# Patient Record
Sex: Male | Born: 1965 | Race: White | Hispanic: No | Marital: Married | Smoking: Former smoker
Health system: Southern US, Community
[De-identification: ages and names within clinical notes are randomized; demographics above are authoritative.]

## PROBLEM LIST (undated history)

## (undated) DIAGNOSIS — M545 Low back pain, unspecified: Secondary | ICD-10-CM

## (undated) DIAGNOSIS — I252 Old myocardial infarction: Secondary | ICD-10-CM

## (undated) DIAGNOSIS — F1010 Alcohol abuse, uncomplicated: Secondary | ICD-10-CM

## (undated) DIAGNOSIS — I2510 Atherosclerotic heart disease of native coronary artery without angina pectoris: Secondary | ICD-10-CM

## (undated) DIAGNOSIS — E119 Type 2 diabetes mellitus without complications: Secondary | ICD-10-CM

## (undated) DIAGNOSIS — I219 Acute myocardial infarction, unspecified: Secondary | ICD-10-CM

## (undated) DIAGNOSIS — T7840XA Allergy, unspecified, initial encounter: Secondary | ICD-10-CM

## (undated) DIAGNOSIS — I639 Cerebral infarction, unspecified: Secondary | ICD-10-CM

## (undated) DIAGNOSIS — M519 Unspecified thoracic, thoracolumbar and lumbosacral intervertebral disc disorder: Secondary | ICD-10-CM

## (undated) DIAGNOSIS — Z9289 Personal history of other medical treatment: Secondary | ICD-10-CM

## (undated) DIAGNOSIS — K219 Gastro-esophageal reflux disease without esophagitis: Secondary | ICD-10-CM

## (undated) DIAGNOSIS — M1990 Unspecified osteoarthritis, unspecified site: Secondary | ICD-10-CM

## (undated) DIAGNOSIS — I10 Essential (primary) hypertension: Secondary | ICD-10-CM

## (undated) DIAGNOSIS — E669 Obesity, unspecified: Secondary | ICD-10-CM

## (undated) DIAGNOSIS — Z87891 Personal history of nicotine dependence: Secondary | ICD-10-CM

## (undated) DIAGNOSIS — G8929 Other chronic pain: Secondary | ICD-10-CM

## (undated) HISTORY — DX: Unspecified thoracic, thoracolumbar and lumbosacral intervertebral disc disorder: M51.9

## (undated) HISTORY — DX: Personal history of other medical treatment: Z92.89

## (undated) HISTORY — DX: Type 2 diabetes mellitus without complications: E11.9

## (undated) HISTORY — PX: CORONARY ANGIOPLASTY: SHX604

## (undated) HISTORY — DX: Essential (primary) hypertension: I10

## (undated) HISTORY — DX: Old myocardial infarction: I25.2

## (undated) HISTORY — DX: Allergy, unspecified, initial encounter: T78.40XA

## (undated) HISTORY — PX: BACK SURGERY: SHX140

---

## 1998-02-25 ENCOUNTER — Ambulatory Visit (HOSPITAL_COMMUNITY): Admission: RE | Admit: 1998-02-25 | Discharge: 1998-02-26 | Payer: Self-pay | Admitting: Neurosurgery

## 1998-03-11 ENCOUNTER — Ambulatory Visit (HOSPITAL_COMMUNITY): Admission: RE | Admit: 1998-03-11 | Discharge: 1998-03-12 | Payer: Self-pay | Admitting: Neurosurgery

## 1998-03-25 ENCOUNTER — Ambulatory Visit (HOSPITAL_COMMUNITY): Admission: RE | Admit: 1998-03-25 | Discharge: 1998-03-25 | Payer: Self-pay | Admitting: Neurosurgery

## 1998-04-12 ENCOUNTER — Inpatient Hospital Stay (HOSPITAL_COMMUNITY): Admission: RE | Admit: 1998-04-12 | Discharge: 1998-04-14 | Payer: Self-pay | Admitting: Neurosurgery

## 1998-11-05 HISTORY — PX: BACK SURGERY: SHX140

## 1998-11-05 HISTORY — PX: LUMBAR FUSION: SHX111

## 2004-05-05 HISTORY — PX: CARDIAC CATHETERIZATION: SHX172

## 2005-03-14 ENCOUNTER — Ambulatory Visit: Payer: Self-pay | Admitting: Family Medicine

## 2005-03-28 ENCOUNTER — Ambulatory Visit: Payer: Self-pay | Admitting: Orthopedic Surgery

## 2005-07-30 ENCOUNTER — Ambulatory Visit: Payer: Self-pay | Admitting: Family Medicine

## 2005-09-10 ENCOUNTER — Ambulatory Visit: Payer: Self-pay | Admitting: Family Medicine

## 2005-10-17 ENCOUNTER — Ambulatory Visit: Payer: Self-pay | Admitting: Family Medicine

## 2005-11-14 ENCOUNTER — Ambulatory Visit: Payer: Self-pay | Admitting: Family Medicine

## 2006-02-01 ENCOUNTER — Ambulatory Visit: Payer: Self-pay | Admitting: Family Medicine

## 2006-02-19 ENCOUNTER — Ambulatory Visit: Payer: Self-pay | Admitting: Family Medicine

## 2006-04-02 ENCOUNTER — Ambulatory Visit: Payer: Self-pay | Admitting: Family Medicine

## 2006-07-01 ENCOUNTER — Ambulatory Visit: Payer: Self-pay | Admitting: Family Medicine

## 2006-09-03 ENCOUNTER — Ambulatory Visit: Payer: Self-pay | Admitting: Family Medicine

## 2006-09-30 ENCOUNTER — Ambulatory Visit: Payer: Self-pay | Admitting: Family Medicine

## 2006-11-13 ENCOUNTER — Ambulatory Visit: Payer: Self-pay | Admitting: Family Medicine

## 2007-01-07 ENCOUNTER — Ambulatory Visit: Payer: Self-pay | Admitting: Family Medicine

## 2007-01-27 ENCOUNTER — Ambulatory Visit: Payer: Self-pay | Admitting: Family Medicine

## 2007-02-18 ENCOUNTER — Ambulatory Visit: Payer: Self-pay | Admitting: Family Medicine

## 2007-06-03 ENCOUNTER — Ambulatory Visit: Payer: Self-pay | Admitting: Family Medicine

## 2007-06-03 DIAGNOSIS — I10 Essential (primary) hypertension: Secondary | ICD-10-CM | POA: Insufficient documentation

## 2007-06-05 LAB — CONVERTED CEMR LAB
Albumin: 3.8 g/dL (ref 3.5–5.2)
Basophils Relative: 1 % (ref 0.0–1.0)
CO2: 27 meq/L (ref 19–32)
Chloride: 104 meq/L (ref 96–112)
Creatinine, Ser: 1 mg/dL (ref 0.4–1.5)
Eosinophils Relative: 6.8 % — ABNORMAL HIGH (ref 0.0–5.0)
GFR calc non Af Amer: 87 mL/min
HCT: 45.9 % (ref 39.0–52.0)
Hemoglobin: 16 g/dL (ref 13.0–17.0)
LDL Cholesterol: 126 mg/dL — ABNORMAL HIGH (ref 0–99)
Lymphocytes Relative: 33.1 % (ref 12.0–46.0)
Monocytes Absolute: 0.9 10*3/uL — ABNORMAL HIGH (ref 0.2–0.7)
Neutro Abs: 3 10*3/uL (ref 1.4–7.7)
Neutrophils Relative %: 45.2 % (ref 43.0–77.0)
RDW: 11.7 % (ref 11.5–14.6)
Sodium: 139 meq/L (ref 135–145)
TSH: 1.09 microintl units/mL (ref 0.35–5.50)
VLDL: 11 mg/dL (ref 0–40)
WBC: 6.7 10*3/uL (ref 4.5–10.5)

## 2007-07-01 ENCOUNTER — Ambulatory Visit: Payer: Self-pay | Admitting: Family Medicine

## 2007-07-01 DIAGNOSIS — F528 Other sexual dysfunction not due to a substance or known physiological condition: Secondary | ICD-10-CM

## 2007-07-01 DIAGNOSIS — M545 Low back pain: Secondary | ICD-10-CM

## 2007-07-01 DIAGNOSIS — R209 Unspecified disturbances of skin sensation: Secondary | ICD-10-CM | POA: Insufficient documentation

## 2007-07-01 DIAGNOSIS — Z87891 Personal history of nicotine dependence: Secondary | ICD-10-CM | POA: Insufficient documentation

## 2007-09-10 ENCOUNTER — Encounter: Payer: Self-pay | Admitting: Family Medicine

## 2007-09-29 ENCOUNTER — Ambulatory Visit: Payer: Self-pay | Admitting: Family Medicine

## 2007-09-29 DIAGNOSIS — E538 Deficiency of other specified B group vitamins: Secondary | ICD-10-CM | POA: Insufficient documentation

## 2007-10-28 ENCOUNTER — Ambulatory Visit: Payer: Self-pay | Admitting: Family Medicine

## 2007-11-28 ENCOUNTER — Ambulatory Visit: Payer: Self-pay | Admitting: Family Medicine

## 2007-12-30 ENCOUNTER — Ambulatory Visit: Payer: Self-pay | Admitting: Family Medicine

## 2008-01-27 ENCOUNTER — Ambulatory Visit: Payer: Self-pay | Admitting: Family Medicine

## 2008-02-27 ENCOUNTER — Ambulatory Visit: Payer: Self-pay | Admitting: Family Medicine

## 2008-03-30 ENCOUNTER — Ambulatory Visit: Payer: Self-pay | Admitting: Family Medicine

## 2008-04-30 ENCOUNTER — Ambulatory Visit: Payer: Self-pay | Admitting: Family Medicine

## 2008-05-31 ENCOUNTER — Ambulatory Visit: Payer: Self-pay | Admitting: Family Medicine

## 2008-07-01 ENCOUNTER — Ambulatory Visit: Payer: Self-pay | Admitting: Family Medicine

## 2008-08-02 ENCOUNTER — Ambulatory Visit: Payer: Self-pay | Admitting: Family Medicine

## 2008-08-25 ENCOUNTER — Ambulatory Visit: Payer: Self-pay | Admitting: Family Medicine

## 2008-08-26 ENCOUNTER — Ambulatory Visit: Payer: Self-pay | Admitting: Family Medicine

## 2008-09-01 ENCOUNTER — Ambulatory Visit: Payer: Self-pay | Admitting: Family Medicine

## 2008-09-13 ENCOUNTER — Ambulatory Visit: Payer: Self-pay | Admitting: Family Medicine

## 2008-09-13 DIAGNOSIS — IMO0002 Reserved for concepts with insufficient information to code with codable children: Secondary | ICD-10-CM

## 2008-09-14 ENCOUNTER — Ambulatory Visit: Payer: Self-pay | Admitting: Family Medicine

## 2008-09-15 ENCOUNTER — Ambulatory Visit: Payer: Self-pay | Admitting: Family Medicine

## 2008-09-20 ENCOUNTER — Ambulatory Visit: Payer: Self-pay | Admitting: Family Medicine

## 2008-09-23 ENCOUNTER — Ambulatory Visit: Payer: Self-pay | Admitting: Family Medicine

## 2009-06-13 ENCOUNTER — Ambulatory Visit: Payer: Self-pay | Admitting: Internal Medicine

## 2009-06-16 LAB — CONVERTED CEMR LAB
ALT: 50 units/L (ref 0–53)
AST: 29 units/L (ref 0–37)
Basophils Relative: 0.6 % (ref 0.0–3.0)
Bilirubin, Direct: 0 mg/dL (ref 0.0–0.3)
CO2: 30 meq/L (ref 19–32)
Creatinine, Ser: 1 mg/dL (ref 0.4–1.5)
Eosinophils Relative: 5.9 % — ABNORMAL HIGH (ref 0.0–5.0)
Glucose, Bld: 120 mg/dL — ABNORMAL HIGH (ref 70–99)
Hemoglobin: 16.9 g/dL (ref 13.0–17.0)
Lymphocytes Relative: 25.5 % (ref 12.0–46.0)
MCV: 100.1 fL — ABNORMAL HIGH (ref 78.0–100.0)
Neutrophils Relative %: 58.2 % (ref 43.0–77.0)
Potassium: 3.9 meq/L (ref 3.5–5.1)
RBC: 4.83 M/uL (ref 4.22–5.81)
Sodium: 145 meq/L (ref 135–145)
Total Bilirubin: 1 mg/dL (ref 0.3–1.2)
WBC: 6.9 10*3/uL (ref 4.5–10.5)

## 2010-03-13 ENCOUNTER — Ambulatory Visit: Payer: Self-pay | Admitting: Family Medicine

## 2010-03-15 ENCOUNTER — Ambulatory Visit: Payer: Self-pay | Admitting: Family Medicine

## 2010-03-17 ENCOUNTER — Ambulatory Visit: Payer: Self-pay | Admitting: Family Medicine

## 2010-03-24 ENCOUNTER — Ambulatory Visit: Payer: Self-pay | Admitting: Family Medicine

## 2010-03-31 ENCOUNTER — Ambulatory Visit: Payer: Self-pay | Admitting: Family Medicine

## 2010-06-13 ENCOUNTER — Encounter (INDEPENDENT_AMBULATORY_CARE_PROVIDER_SITE_OTHER): Payer: Self-pay | Admitting: *Deleted

## 2010-12-05 NOTE — Assessment & Plan Note (Signed)
Summary: FU   Vital Signs:  Patient Profile:   45 Years Old Male Weight:      98.8 pounds Temp:     98.8 degrees F oral Pulse rate:   80 / minute Pulse rhythm:   regular BP sitting:   120 / 80  (left arm) Cuff size:   large  Vitals Entered By: Providence Crosby (September 15, 2008 11:38 AM)                 Chief Complaint:  followup abscess.  History of Present Illness: Here for dressing change. Had fever after incision done yesterday but has felt ok since. Finally slept last night using pain medication. It feels better to him today. has changed the dressing once last night. Is on vacation this week as he was going to spend the week hunting. Hasn't been able to do that qnd I don't anticipate he will.    Prior Medications Reviewed Using: Patient Recall  Current Allergies (reviewed today): ACE INHIBITORS      Physical Exam  General:     Well-developed,well-nourished,in no acute distress; alert,appropriate and cooperative throughout examination Head:     Normocephalic and atraumatic without obvious abnormalities. No apparent alopecia or balding. Eyes:     Conjunctiva clear bilaterally.  Ears:     External ear exam shows no significant lesions or deformities.  Otoscopic examination reveals clear canals, tympanic membranes are intact bilaterally without bulging, retraction, inflammation or discharge. Hearing is grossly normal bilaterally. Nose:     External nasal examination shows no deformity or inflammation. Nasal mucosa are pink and moist without lesions or exudates. Mouth:     Oral mucosa and oropharynx without lesions or exudates.  Teeth in good repair. Skin:     10 x4 cm swollen organized minimally fluctulant area under right arm on lat superior chest wall. Drains in place, removed and drained spontaneously from second incision with drain out. Drain left out of first incision. Drain replaced of second incision after culture taken.     Impression &  Recommendations:  Problem # 1:  CELLULITIS AND ABSCESS OF UPPER ARM AND FOREARM (ICD-682.3) Assessment: Improved Slighty improved. Drain replaced and dressing reapplied. Culture sent. His updated medication list for this problem includes:    Bactrim Ds 800-160 Mg Tabs (Sulfamethoxazole-trimethoprim) .Marland Kitchen... 2 tabs by mouth two times a day    Bactrim Ds 800-160 Mg Tabs (Sulfamethoxazole-trimethoprim) .Marland Kitchen... 2  tabs by mouth two times a day  Orders: T-Culture, Wound ([id number]) No Charge Patient Arrived (NCPA0) (NCPA0)  Elevate affected area. Warm moist compresses for 20 minutes every 2 hours while awake. Take antibiotics as directed and take acetaminophen as needed. To be seen in 48-72 hours if no improvement, sooner if worse.   Complete Medication List: 1)  Cozaar 100 Mg Tabs (Losartan potassium) 2)  Hydrochlorothiazide 25 Mg Tabs (Hydrochlorothiazide) .... Take 1/2 tablet once a day 3)  Nasacort Aq 55 Mcg/act Aers (Triamcinolone acetonide(nasal)) .... 2 sprays each nostril once daily 4)  Bactrim Ds 800-160 Mg Tabs (Sulfamethoxazole-trimethoprim) .... 2 tabs by mouth two times a day 5)  Bactrim Ds 800-160 Mg Tabs (Sulfamethoxazole-trimethoprim) .... 2  tabs by mouth two times a day 6)  Percocet 7.5-325 Mg Tabs (Oxycodone-acetaminophen) .... One tab by mouth every 6 hrs as needed for pain   Patient Instructions: 1)  Will see back Fri 0945. Again Mon both times for dressing changes.   ]

## 2010-12-05 NOTE — Letter (Signed)
Summary: Out of Work  [facility] [employer] [facility] at [facility]  [address] [place], [place] [address]   Phone: [phone]  Fax: [phone]    Mar 13, 2010   Employee:  London G Simmering    To Whom It May Concern:   For Medical reasons, please excuse the above named employee from work for the following dates:  Start:   Mar 13, 2010  End:   Mar 16, 2010  If you need additional information, please feel free to contact our office.         Sincerely,    Ruthe Mannan MD

## 2010-12-05 NOTE — Letter (Signed)
Summary: Out of Work  [facility] [employer] [facility] at [facility]  [address] [place], [place] [address]   Phone: [phone]  Fax: [phone]    Mar 17, 2010   Employee:  Pepper G Pond    To Whom It May Concern:   For Medical reasons, please excuse the above named employee from work for the following dates:  Start:   03/17/2010  End:   can return 03/20/2010 monday if he is feeling better   If you need additional information, please feel free to contact our office.         Sincerely,    Judith Part MD

## 2010-12-05 NOTE — Assessment & Plan Note (Signed)
Summary: 2 DAY F/U DLO   Vital Signs:  Patient profile:   45 year old male Height:      69 inches Weight:      266 pounds BMI:     39.42 Temp:     98.2 degrees F oral Pulse rate:   64 / minute Pulse rhythm:   regular BP sitting:   114 / 74  (left arm) Cuff size:   large  Vitals Entered By: Lewanda Rife LPN (Mar 17, 2010 10:47 AM) CC: f/u of lt arm   History of Present Illness: getting better less redness and swelling and pain only d/c has been clear- no pus   prelim wound cx shows some staph- still waiting on final report   no side eff from the abx  Allergies: 1)  Ace Inhibitors  Review of Systems General:  Denies chills, fatigue, and fever. Eyes:  Denies eye irritation. CV:  Denies chest pain or discomfort and palpitations. MS:  Denies muscle weakness. Derm:  Complains of itching; denies rash. Neuro:  Denies numbness and tingling. Endo:  Denies cold intolerance and heat intolerance.  Physical Exam  General:  Well-developed,well-nourished,in no acute distress; alert,appropriate and cooperative throughout examination Mouth:  pharynx pink and moist.   Neck:  No deformities, masses, or tenderness noted. Heart:  normal rate, regular rhythm, no murmur, and no gallop.   Skin:  abcess on L arm is still firm but smaller andless tender redness is markedly decreased as is swelling  no drainage Cervical Nodes:  No lymphadenopathy noted Psych:  normal affect, talkative and pleasant    Impression & Recommendations:  Problem # 1:  CELLULITIS AND ABSCESS OF UPPER ARM AND FOREARM (ICD-682.3) Assessment Improved much improved with addn of septra rev wound cx - suspect will be mrsa  disc hygiene for this  continue this and hygiene and warm compress f/u mid next week update asap or seek care if worse redness/swelling/fever or pain His updated medication list for this problem includes:    Doxycycline Hyclate 100 Mg Caps (Doxycycline hyclate) .Marland Kitchen... Take 1 tab twice a day x 10  days    Septra Ds 800-160 Mg Tabs (Sulfamethoxazole-trimethoprim) .Marland Kitchen... 1 by mouth two times a day for 10 days  Complete Medication List: 1)  Cozaar 100 Mg Tabs (Losartan potassium) .... One by mouth daily 2)  Hydrochlorothiazide 25 Mg Tabs (Hydrochlorothiazide) .... Take 1/2 tablet once a day 3)  Doxycycline Hyclate 100 Mg Caps (Doxycycline hyclate) .... Take 1 tab twice a day x 10 days 4)  Vicodin 5-500 Mg Tabs (Hydrocodone-acetaminophen) .Marland Kitchen.. 1-2 tab by mouth two times a day as needed pan 5)  Septra Ds 800-160 Mg Tabs (Sulfamethoxazole-trimethoprim) .Marland Kitchen.. 1 by mouth two times a day for 10 days  Patient Instructions: 1)  continue current antibiotics 2)  back to work on monday 3)  follow up with me mid next week 4)  if worse or pain or fever please call 5)  continue cleansing and warm compress  6)  will update when cx result returns  Current Allergies (reviewed today): ACE INHIBITORS

## 2010-12-05 NOTE — Assessment & Plan Note (Signed)
Summary: swollen arm/alc   Vital Signs:  Patient profile:   45 year old male Height:      69 inches Weight:      271.50 pounds BMI:     40.24 Temp:     98.2 degrees F oral Pulse rate:   80 / minute Pulse rhythm:   regular BP sitting:   132 / 72  (right arm) Cuff size:   large  Vitals Entered By: Delilah Shan CMA Duncan Dull) (Mar 13, 2010 10:48 AM) CC: Swollen left arm   History of Present Illness: 45 yo with bump on left arm. Noticed a tiny bug bite on friday. Since then, has become much bigger, firm and red.  Entire arm hurts when he moves it. Started draining clear fluid last night.  Feels a little feverish and nauseated. No vomiting.  PMH of boil under his left arm in past.  Unsure if it was MRSA.  Current Medications (verified): 1)  Cozaar 100 Mg Tabs (Losartan Potassium) .... One By Mouth Daily 2)  Hydrochlorothiazide 25 Mg Tabs (Hydrochlorothiazide) .... Take 1/2 Tablet Once A Day 3)  Doxycycline Hyclate 100 Mg Caps (Doxycycline Hyclate) .... Take 1 Tab Twice A Day 4)  Vicodin 5-500 Mg Tabs (Hydrocodone-Acetaminophen) .Marland Kitchen.. 1-2 Tab By Mouth Two Times A Day As Needed Pan  Allergies: 1)  Ace Inhibitors  Review of Systems      See HPI General:  Complains of chills; denies fever. GI:  Complains of nausea; denies vomiting.  Physical Exam  General:  alert and normal appearance.   VSS, non toxic appearing. Skin:  3 cm boil , open, with very firm area surroudning (approx 6 cm), erythematous, warm to touch. No drainage visible but area is open. Non fluctuant. Psych:  normally interactive, good eye contact, not anxious appearing, and not depressed appearing.     Impression & Recommendations:  Problem # 1:  CELLULITIS AND ABSCESS OF UPPER ARM AND FOREARM (ICD-682.3) Assessment New Given that it is open and draining but very firm today, will not I and D today. Given Doxycyline 100 mg two times a day x 10 days. Advised warm compresses, Vicodin as needed pain. Follow  up with primary MD later this week ( I am out of town) if no improvement or symptoms worsen. His updated medication list for this problem includes:    Doxycycline Hyclate 100 Mg Caps (Doxycycline hyclate) .Marland Kitchen... Take 1 tab twice a day x 10 days  Complete Medication List: 1)  Cozaar 100 Mg Tabs (Losartan potassium) .... One by mouth daily 2)  Hydrochlorothiazide 25 Mg Tabs (Hydrochlorothiazide) .... Take 1/2 tablet once a day 3)  Doxycycline Hyclate 100 Mg Caps (Doxycycline hyclate) .... Take 1 tab twice a day x 10 days 4)  Vicodin 5-500 Mg Tabs (Hydrocodone-acetaminophen) .Marland Kitchen.. 1-2 tab by mouth two times a day as needed pan  Patient Instructions: 1)  Make an appointment to see Dr. Milinda Antis on Thursday or Friday. 2)  Put warm compresses on it up to 5 times a day. 3)  Call us if you develop a fever or it starts to look worse. Prescriptions: DOXYCYCLINE HYCLATE 100 MG CAPS (DOXYCYCLINE HYCLATE) Take 1 tab twice a day x 10 days  #20 x 0   Entered and Authorized by:   Ruthe Mannan MD   Signed by:   Ruthe Mannan MD on 03/13/2010   Method used:   [facility]        [facility]  [address] [phone][facility] (retail)  [address]       [place], [place]  [address]       Ph: [phone] or [phone]       Fax: [phone]   RxID:   [id number] DOXYCYCLINE HYCLATE 100 MG CAPS (DOXYCYCLINE HYCLATE) Take 1 tab twice a day x 10 days  #20 x 0   Entered and Authorized by:   Ruthe Mannan MD   Signed by:   Ruthe Mannan MD on 03/13/2010   Method used:   Print then Give to Patient   RxID:   [id number] VICODIN 5-500 MG TABS (HYDROCODONE-ACETAMINOPHEN) 1-2 tab by mouth two times a day as needed pan  #30 x 0   Entered and Authorized by:   Ruthe Mannan MD   Signed by:   Ruthe Mannan MD on 03/13/2010   Method used:   Print then Give to Patient   RxID:   [id number] DOXYCYCLINE HYCLATE 100 MG CAPS (DOXYCYCLINE HYCLATE) Take 1 tab twice a day  #20 x 0   Entered and Authorized by:   Ruthe Mannan MD   Signed  by:   Ruthe Mannan MD on 03/13/2010   Method used:   [facility]        [facility]  [address] (retail)       [address]       [place], [place]  [address]       Ph: [phone] or [phone]       Fax: [phone]   RxID:   [id number]   Current Allergies (reviewed today): ACE INHIBITORS

## 2010-12-05 NOTE — Letter (Signed)
Summary: Nadara Eaton letter  [facility]  [address] [place], [place] [address]   Phone: [phone]  Fax: [phone]       06/13/2010 MRN: [id number]  Brentin Mode [address] [place], [place]  [address]  Dear Mr. Kruger,  [facility] and [place] announce the retirement of Arta Silence, M.D., from full-time practice at the [facility] office effective May 04, 2010 and his plans of returning part-time.  It is important to Dr. Hetty Ely and to our practice that you understand that [facility] has seven physicians in our office for your health care needs.  We will continue to offer the same exceptional care that you have today.    Dr. Hetty Ely has spoken to many of you about his plans for retirement and returning part-time in the fall.   We will continue to work with you through the transition to schedule appointments for you in the office and meet the high standards that [facility] is committed to.   Again, it is with great pleasure that we share the news that Dr. Hetty Ely will return to [facility] in October of 2011 with a reduced schedule.    If you have any questions, or would like to request an appointment with one of our physicians, please call us at [phone] and press the option for Scheduling an appointment.  We take pleasure in providing you with excellent patient care and look forward to seeing you at your next office visit.  Our [facility] Physicians are:  Tillman Abide, M.D. Laurita Quint, M.D. Roxy Manns, M.D. Kerby Nora, M.D. Hannah Beat, M.D. Ruthe Mannan, M.D. We proudly welcomed Raechel Ache, M.D. and Eustaquio Boyden, M.D. to the practice in July/August 2011.  Sincerely,  [facility]

## 2010-12-05 NOTE — Assessment & Plan Note (Signed)
Summary: ROA FOR FOLLOW-UP/JRR   Vital Signs:  Patient profile:   45 year old male Height:      69 inches Weight:      275.50 pounds BMI:     40.83 Temp:     97.9 degrees F oral Pulse rate:   76 / minute Pulse rhythm:   regular BP sitting:   128 / 76  (left arm) Cuff size:   large  Vitals Entered By: Lewanda Rife LPN (Mar 24, 2010 2:06 PM) CC: follow up left arm   History of Present Illness: f/u cellulitis and abcess of arm  on doxy/ sulfa and was improving cx did grow out mrsa sens to both  area never did get soft- is firm -- but is much smaller and less red  no drainage   no fever and feeling ok overall   has 1-2 d left of sulfa  Allergies: 1)  Ace Inhibitors  Review of Systems General:  Denies chills, fatigue, fever, loss of appetite, and malaise; feels fine and back to work . CV:  Denies chest pain or discomfort and palpitations. Resp:  Denies cough and wheezing. MS:  Denies joint pain, cramps, and muscle weakness. Derm:  Complains of itching and lesion(s); denies rash. Neuro:  Denies numbness and tingling. Heme:  Denies abnormal bruising and bleeding.  Physical Exam  General:  Well-developed,well-nourished,in no acute distress; alert,appropriate and cooperative throughout examination Neck:  no LN  Skin:  area of induration and redness on L arm is much smaller- several cm scab intact/ no drainage  still very firm - no fluctuance   Cervical Nodes:  No lymphadenopathy noted Psych:  normal affect, talkative and pleasant    Impression & Recommendations:  Problem # 1:  CELLULITIS AND ABSCESS OF UPPER ARM AND FOREARM (ICD-682.3) Assessment Improved  this is greatly improved -- central area still firm with a scar  will plan to continue the septra ds for mrsa for 10 more days and re check in 1 week  adv to continue hygiene and warm compresses to enc drainage if any/ and loosely cover  given handout on mrsa from [facility] to read  The following medications were  removed from the medication list:    Septra Ds 800-160 Mg Tabs (Sulfamethoxazole-trimethoprim) .Marland Kitchen... 1 by mouth two times a day for 10 days His updated medication list for this problem includes:    Bactrim Ds 800-160 Mg Tabs (Sulfamethoxazole-trimethoprim) .Marland Kitchen... 1 by mouth two times a day for 10 days for arm wound  Orders: Prescription Created Electronically [id number])  Complete Medication List: 1)  Cozaar 100 Mg Tabs (Losartan potassium) .... One by mouth daily 2)  Hydrochlorothiazide 25 Mg Tabs (Hydrochlorothiazide) .... Take 1/2 tablet once a day 3)  Vicodin 5-500 Mg Tabs (Hydrocodone-acetaminophen) .Marland Kitchen.. 1-2 tab by mouth two times a day as needed pan 4)  Bactrim Ds 800-160 Mg Tabs (Sulfamethoxazole-trimethoprim) .Marland Kitchen.. 1 by mouth two times a day for 10 days for arm wound  Patient Instructions: 1)  keep washing area with antibacterial soap and water 2)  continue sulfa antibiotic for 10 more days - I sent to pharmacy  3)  follow up with me in 1 week for one more re check (can cancel that if totally better) 4)  update me asap if pain/ worse swelling or redness or fever  Prescriptions: BACTRIM DS 800-160 MG TABS (SULFAMETHOXAZOLE-TRIMETHOPRIM) 1 by mouth two times a day for 10 days for arm wound  #20 x 0   Entered and Authorized  by:   Judith Part MD   Signed by:   Judith Part MD on 03/24/2010   Method used:   [facility]        [facility]  [address] (retail)       [address]       [place], [place]  [address]       Ph: [phone] or [phone]       Fax: [phone]   RxID:   [id number]   Current Allergies (reviewed today): ACE INHIBITORS

## 2010-12-05 NOTE — Assessment & Plan Note (Signed)
Summary: recheck/dlo   Vital Signs:  Patient profile:   45 year old male Height:      69 inches Weight:      271.25 pounds BMI:     40.20 Temp:     98.3 degrees F oral Pulse rate:   76 / minute Pulse rhythm:   regular BP sitting:   118 / 76  (left arm) Cuff size:   large  Vitals Entered By: Lewanda Rife LPN (Mar 31, 2010 12:16 PM) CC: f/u recheck lt arm   History of Present Illness: here for re check arm abcess/ mrsa -- on another 10 days of septra   quit doing warm compresses not painful is smaller   no fevers or other symptoms   Allergies: 1)  Ace Inhibitors  Past History:  Past Medical History: Last updated: 07-11-2007 Hypertension  Past Surgical History: Last updated: 11-Jul-2007 L-S ruptured disk- surgery Cardiac cath- minimal CAD (05/2004)  Family History: Last updated: 2007/07/11 Father: deceased- MI, HTN Mother: well Siblings: 1 sister  Social History: Last updated: Jul 11, 2007 Marital Status: Married Children: 1 son Occupation: Haematologist  Risk Factors: Smoking Status: quit (Jul 11, 2007)  Review of Systems General:  Denies chills, fatigue, fever, loss of appetite, and malaise. Eyes:  Denies blurring. CV:  Denies chest pain or discomfort and palpitations. Resp:  Denies cough, shortness of breath, and wheezing. GI:  Denies abdominal pain and change in bowel habits. MS:  Denies loss of strength and stiffness. Derm:  Complains of itching; denies poor wound healing. Neuro:  Denies numbness, tremors, and weakness.  Physical Exam  General:  Well-developed,well-nourished,in no acute distress; alert,appropriate and cooperative throughout examination Eyes:  vision grossly intact, pupils equal, pupils round, and pupils reactive to light.   Msk:  see skin exam  Pulses:  R and L carotid,radial,femoral,dorsalis pedis and posterior tibial pulses are full and equal bilaterally Neurologic:  sensation intact to light touch.   Skin:  area of induration on  L arm is down to just over 1 cm with less redness scab in place no drainage or tenderness  Cervical Nodes:  No lymphadenopathy noted Psych:  normal affect, talkative and pleasant    Impression & Recommendations:  Problem # 1:  CELLULITIS AND ABSCESS OF UPPER ARM AND FOREARM (ICD-682.3) Assessment Improved with hx mrsa- continues to improve on bactrim will finish course inst to start warm compress again  update next week if not continuing to imp or earlier if any worsening His updated medication list for this problem includes:    Bactrim Ds 800-160 Mg Tabs (Sulfamethoxazole-trimethoprim) .Marland Kitchen... 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 1 by mouth once daily as directed  prn 30 minutes before sexual activity  Orders: Prescription Created Electronically [id number]  Complete Medication List: 1)  Cozaar 100 Mg Tabs (Losartan potassium) .... One by mouth daily 2)  Hydrochlorothiazide 25 Mg Tabs (Hydrochlorothiazide) .... Take 1/2 tablet once a day 3)  Vicodin 5-500 Mg Tabs (Hydrocodone-acetaminophen) .Marland Kitchen.. 1-2 tab by mouth two times a day as needed pan 4)  Bactrim Ds 800-160 Mg Tabs (Sulfamethoxazole-trimethoprim) .Marland Kitchen.. 1 by mouth two times a day for 10 days for arm wound 5)  Viagra 50 Mg Tabs (Sildenafil citrate) .Marland Kitchen.. 1 by mouth once daily as directed  prn 30  minutes before sexual activity  Patient Instructions: 1)  arm looks better and better  2)  use warm compress at least once per day 3)  update me asap if fever or worse 4)  call in a week if not getting smaller 5)  finish your antibiotics  Prescriptions: VIAGRA 50 MG TABS (SILDENAFIL CITRATE) 1 by mouth once daily as directed  prn 30 minutes before sexual activity  #9 x 5   Entered and Authorized by:   Judith Part MD    Signed by:   Judith Part MD on 03/31/2010   Method used:   [facility]        [facility]  [address] (retail)       [address]       [place], [place]  [address]       Ph: [phone] or [phone]       Fax: [phone]   RxID:   [id number]   Current Allergies (reviewed today): ACE INHIBITORS

## 2010-12-05 NOTE — Assessment & Plan Note (Signed)
Summary: 30 MIN f/u per dr aron/dlo   Vital Signs:  Patient profile:   45 year old male Height:      69 inches Weight:      268.75 pounds BMI:     39.83 Temp:     98.2 degrees F oral Pulse rate:   80 / minute Pulse rhythm:   regular BP sitting:   138 / 84  (left arm) Cuff size:   large  Vitals Entered By: Lewanda Rife LPN (Mar 15, 2010 9:41 AM) CC: 30 min f/u to check lt arm   History of Present Illness: on doxycycline for lesion/ cellulitis of arm rev Dr Elmer Sow note   is really hurting but overall - but looking a whole lot better on doxy- tolerating fine  feels a little softer  just a little clear drainage  ? started as a bug bite  has had mrsa under arm before   Allergies: 1)  Ace Inhibitors  Past History:  Past Medical History: Last updated: 2007/07/11 Hypertension  Past Surgical History: Last updated: Jul 11, 2007 L-S ruptured disk- surgery Cardiac cath- minimal CAD (05/2004)  Family History: Last updated: 2007/07/11 Father: deceased- MI, HTN Mother: well Siblings: 1 sister  Social History: Last updated: 07-11-2007 Marital Status: Married Children: 1 son Occupation: Haematologist  Risk Factors: Smoking Status: quit (07/11/2007)  Review of Systems General:  Denies fatigue, fever, loss of appetite, and malaise. Eyes:  Denies blurring and eye irritation. CV:  Denies chest pain or discomfort and palpitations. Resp:  Denies cough and wheezing. GI:  Denies abdominal pain, bloody stools, change in bowel habits, and indigestion. GU:  Denies dysuria. MS:  Denies muscle aches and cramps. Derm:  Denies itching. Neuro:  Denies numbness and tingling. Heme:  Denies abnormal bruising and bleeding.  Physical Exam  General:  alert and normal appearance.   VSS, non toxic appearing. Head:  normocephalic, atraumatic, and no abnormalities observed.   Mouth:  pharynx pink and moist.   Neck:  supple, no masses, no thyromegaly, no carotid bruits, and no cervical  lymphadenopathy.   Lungs:  normal respiratory effort and normal breath sounds.   Heart:  normal rate, regular rhythm, no murmur, and no gallop.   Msk:  see skin exam Neurologic:  sensation intact to light touch, gait normal, and DTRs symmetrical and normal.   Skin:  wound L arm - swelling and erythema (per pt much imp)  was somewhat soft but attempt at I and D did not yeild much material  dressed / sterile/ bactroban   anest 1.5 cc of xylocaine 2% with epi sterile prep  I and D - see above  pt tol well  Cervical Nodes:  No lymphadenopathy noted Axillary Nodes:  No palpable lymphadenopathy Psych:  normal affect, talkative and pleasant    Impression & Recommendations:  Problem # 1:  CELLULITIS AND ABSCESS OF UPPER ARM AND FOREARM (ICD-682.3) Assessment Improved attempted I and D with very little drainage per pt is much improved but still painful cx sent of serous fluid add septra for addn mrsa coverage f'u fri --off work  update asap if worse or seek care  His updated medication list for this problem includes:    Doxycycline Hyclate 100 Mg Caps (Doxycycline hyclate) .Marland Kitchen... Take 1 tab twice a day x 10 days    Septra Ds 800-160 Mg Tabs (Sulfamethoxazole-trimethoprim) .Marland Kitchen... 1 by mouth two times a day for 10 days  Orders: T-Culture, Wound ([id number]) I&D Abscess, Simple / Single (10060) Prescription  Created Electronically [phone])  Complete Medication List: 1)  Cozaar 100 Mg Tabs (Losartan potassium) .... One by mouth daily 2)  Hydrochlorothiazide 25 Mg Tabs (Hydrochlorothiazide) .... Take 1/2 tablet once a day 3)  Doxycycline Hyclate 100 Mg Caps (Doxycycline hyclate) .... Take 1 tab twice a day x 10 days 4)  Vicodin 5-500 Mg Tabs (Hydrocodone-acetaminophen) .Marland Kitchen.. 1-2 tab by mouth two times a day as needed pan 5)  Septra Ds 800-160 Mg Tabs (Sulfamethoxazole-trimethoprim) .Marland Kitchen.. 1 by mouth two times a day for 10 days  Patient Instructions: 1)  continue docycycline and add the  septra ds 2)  can take with food  3)  continue warm compresses  4)  if fever or worse or increased redness/ pain or swelling- call asap or seek care in ER if after hours  5)  follow up with me on friday  6)  work note until sat  Prescriptions: SEPTRA DS 800-160 MG TABS (SULFAMETHOXAZOLE-TRIMETHOPRIM) 1 by mouth two times a day for 10 days  #20 x 0   Entered and Authorized by:   Judith Part MD   Signed by:   Judith Part MD on 03/15/2010   Method used:   Electronically to        [facility] (retail)       [address]       [place], [place]  [address]       Ph: [phone] or [phone]       Fax: [phone]   RxID:   [id number]   Current Allergies (reviewed today): ACE INHIBITORS

## 2010-12-05 NOTE — Letter (Signed)
Summary: Out of Work  [facility] [employer] [facility]  [address] [place], [place] [address]   Phone: [phone]  Fax: [phone]    Mar 15, 2010   Employee:  Haydon G Wert    To Whom It May Concern:   For Medical reasons, please excuse the above named employee from work for the following dates:  Start:   03/15/2010  End:   can return 03/18/2010 if he is feeling better   If you need additional information, please feel free to contact our office.         Sincerely,    Judith Part MD

## 2011-02-03 ENCOUNTER — Other Ambulatory Visit: Payer: Self-pay | Admitting: Internal Medicine

## 2011-02-06 ENCOUNTER — Other Ambulatory Visit: Payer: Self-pay | Admitting: *Deleted

## 2011-02-06 MED ORDER — LOSARTAN POTASSIUM 100 MG PO TABS: 100.0000 mg | ORAL_TABLET | Freq: Every day | ORAL | Status: DC

## 2011-02-24 ENCOUNTER — Encounter: Payer: Self-pay | Admitting: Family Medicine

## 2011-03-02 ENCOUNTER — Ambulatory Visit (INDEPENDENT_AMBULATORY_CARE_PROVIDER_SITE_OTHER): Payer: No Typology Code available for payment source | Admitting: Family Medicine

## 2011-03-02 ENCOUNTER — Encounter: Payer: Self-pay | Admitting: Family Medicine

## 2011-03-02 DIAGNOSIS — F17200 Nicotine dependence, unspecified, uncomplicated: Secondary | ICD-10-CM

## 2011-03-02 DIAGNOSIS — I10 Essential (primary) hypertension: Secondary | ICD-10-CM

## 2011-03-02 DIAGNOSIS — E669 Obesity, unspecified: Secondary | ICD-10-CM

## 2011-03-02 MED ORDER — LOSARTAN POTASSIUM 100 MG PO TABS: 100.0000 mg | ORAL_TABLET | Freq: Every day | ORAL | Status: DC

## 2011-03-02 MED ORDER — HYDROCHLOROTHIAZIDE 25 MG PO TABS: 12.5000 mg | ORAL_TABLET | Freq: Every day | ORAL | Status: DC

## 2011-03-02 NOTE — Assessment & Plan Note (Signed)
After 8 years smoke free- started back Plans to quit cold [place] again next month and has a plan Disc in detail risks of smoking and possible outcomes including copd, vascular/ heart disease, cancer , respiratory and sinus infections  Pt voices understanding

## 2011-03-02 NOTE — Assessment & Plan Note (Signed)
Disc need for wt loss and potential risks of obesity

## 2011-03-02 NOTE — Progress Notes (Signed)
  Subjective:    Patient ID: Daniel [name], male    DOB: 03/08/1966, 45 y.o.   MRN: [id number]  HPI Here for f/u of HTN  Also tab   Feeling pretty good  Working a lot -- and getting ready for a trip to [place] to bear hunt  Is excited about that -- later in may  bp fairly well controlled at 136/84 today on current med No side eff No headaches or cp or edema  Does not watch sodium in diet   Feeling good -nothing new going on  Has a bad tooth  Will see dentist on Monday-- is a filling that got cracked     Wt is down 3 lb with bmi of 39  Is trying to eat a healthy diet but slips up  No fast food  Weakness is ice cream   No trouble with his bp med  No swelling or headache   Quit smoking for 8 years cold years cold [place] Just picked them up again- is ready to quit again when he travels   Past Medical History  Diagnosis Date  . Hypertension   . Ruptured disk    Past Surgical History  Procedure Date  . Back surgery     ruptured disk, L-S  . Cardiac catheterization 05/2004    minimal CAD    reports that he has been smoking Cigarettes.  He has been smoking about 1 pack per day. He does not have any smokeless tobacco history on file. His alcohol and drug histories not on file. family history includes Heart disease in his father and Hypertension in his father. Allergies  Allergen Reactions  . Ace Inhibitors     REACTION: cough            Review of Systems Review of Systems  Constitutional: Negative for fever, appetite change, fatigue and unexpected weight change.  Eyes: Negative for pain and visual disturbance.  Respiratory: Negative for cough and shortness of breath.   Cardiovascular: Negative.   Gastrointestinal: Negative for nausea, diarrhea and constipation.  Genitourinary: Negative for urgency and frequency.  Skin: Negative for pallor.  Neurological: Negative for weakness, light-headedness, numbness and headaches.  Hematological: Negative for  adenopathy. Does not bruise/bleed easily.  Psychiatric/Behavioral: Negative for dysphoric mood. The patient is not nervous/anxious.          Objective:   Physical Exam  Constitutional: He appears well-developed and well-nourished.       Obese and well appearing   HENT:  Head: Normocephalic.  Eyes: Conjunctivae and EOM are normal. Pupils are equal, round, and reactive to light.  Neck: Normal range of motion. Neck supple. No JVD present. Carotid bruit is not present. No thyromegaly present.  Cardiovascular: Normal rate and regular rhythm.  Exam reveals no gallop and no friction rub.   No murmur heard. Pulmonary/Chest: Effort normal and breath sounds normal.       Diffusely distant bs without wheeze   Abdominal: Soft. Bowel sounds are normal. He exhibits no abdominal bruit and no mass. There is no tenderness.  Musculoskeletal: Normal range of motion. He exhibits no edema and no tenderness.  Lymphadenopathy:    He has no cervical adenopathy.  Neurological: He is alert. He has normal reflexes. Coordination normal.  Skin: Skin is warm and dry. No rash noted. No pallor.  Psychiatric: He has a normal mood and affect.          Assessment & Plan:

## 2011-03-02 NOTE — Patient Instructions (Signed)
No change in medicines Work on weight loss the best you can  Labs today  Work on quitting smoking -- try to do it on your trip

## 2011-03-02 NOTE — Assessment & Plan Note (Signed)
This is fairly controlled with hct and cozaar Labs today incl lipids Had a talk about strategy for wt loss Less sugar bev and beer/ more exercise Is going to get the sugar busters book - and see how he likes low glycemic diet

## 2011-03-03 LAB — COMPREHENSIVE METABOLIC PANEL
CO2: 27 mEq/L (ref 19–32)
Calcium: 9.9 mg/dL (ref 8.4–10.5)
Creat: 1.11 mg/dL (ref 0.40–1.50)
Glucose, Bld: 97 mg/dL (ref 70–99)
Sodium: 141 mEq/L (ref 135–145)
Total Bilirubin: 0.5 mg/dL (ref 0.3–1.2)
Total Protein: 7.5 g/dL (ref 6.0–8.3)

## 2011-03-03 LAB — TSH: TSH: 1.941 u[IU]/mL (ref 0.350–4.500)

## 2011-03-03 LAB — LIPID PANEL
Cholesterol: 161 mg/dL (ref 0–200)
Total CHOL/HDL Ratio: 3.8 Ratio

## 2011-03-10 ENCOUNTER — Other Ambulatory Visit: Payer: Self-pay | Admitting: Family Medicine

## 2011-12-09 ENCOUNTER — Other Ambulatory Visit: Payer: Self-pay | Admitting: Internal Medicine

## 2011-12-10 NOTE — Telephone Encounter (Signed)
He is due for annual exam in May-please enc him to schedule that  Will refill electronically

## 2011-12-10 NOTE — Telephone Encounter (Signed)
Jamie please schedule annual CPX. Thank you.

## 2012-01-12 ENCOUNTER — Other Ambulatory Visit: Payer: Self-pay | Admitting: Family Medicine

## 2012-01-15 NOTE — Telephone Encounter (Signed)
Refill did not go thru electronically to pharmacy. Rx called to pharmacy. 

## 2012-10-25 ENCOUNTER — Other Ambulatory Visit: Payer: Self-pay | Admitting: Family Medicine

## 2012-10-27 NOTE — Telephone Encounter (Signed)
Ok to refill? No recent appt or labs and no future appt 

## 2012-10-27 NOTE — Telephone Encounter (Signed)
Schedule f/u after the holidays and refil until then, thanks

## 2012-10-27 NOTE — Telephone Encounter (Signed)
Left message on cell phone asking patient to call back.

## 2012-10-27 NOTE — Telephone Encounter (Signed)
Patient was last seen 03/02/11.  No upcoming appts scheduled.  Please advise.

## 2012-10-27 NOTE — Telephone Encounter (Signed)
Please schedule a follow up after the holidays- and refil med until then, thanks

## 2012-11-14 ENCOUNTER — Encounter: Payer: Self-pay | Admitting: Family Medicine

## 2012-11-14 ENCOUNTER — Ambulatory Visit (INDEPENDENT_AMBULATORY_CARE_PROVIDER_SITE_OTHER): Payer: PRIVATE HEALTH INSURANCE | Admitting: Family Medicine

## 2012-11-14 VITALS — BP 135/85 | HR 78 | Temp 98.3°F | Ht 69.0 in | Wt 278.2 lb

## 2012-11-14 DIAGNOSIS — E669 Obesity, unspecified: Secondary | ICD-10-CM

## 2012-11-14 DIAGNOSIS — Z23 Encounter for immunization: Secondary | ICD-10-CM

## 2012-11-14 DIAGNOSIS — I10 Essential (primary) hypertension: Secondary | ICD-10-CM

## 2012-11-14 DIAGNOSIS — F17200 Nicotine dependence, unspecified, uncomplicated: Secondary | ICD-10-CM

## 2012-11-14 LAB — COMPREHENSIVE METABOLIC PANEL
ALT: 48 U/L (ref 0–53)
Albumin: 4.1 g/dL (ref 3.5–5.2)
CO2: 30 mEq/L (ref 19–32)
Calcium: 9.3 mg/dL (ref 8.4–10.5)
Chloride: 103 mEq/L (ref 96–112)
Creatinine, Ser: 1.1 mg/dL (ref 0.4–1.5)
GFR: 77.33 mL/min (ref >60.00)
Sodium: 138 mEq/L (ref 135–145)
Total Protein: 7.4 g/dL (ref 6.0–8.3)

## 2012-11-14 LAB — CBC WITH DIFFERENTIAL/PLATELET
Basophils Absolute: 0.1 10*3/uL (ref 0.0–0.1)
Hemoglobin: 16.1 g/dL (ref 13.0–17.0)
Lymphocytes Relative: 21.6 % (ref 12.0–46.0)
Monocytes Relative: 8.6 % (ref 3.0–12.0)
Neutro Abs: 6.1 10*3/uL (ref 1.4–7.7)
RDW: 11.9 % (ref 11.5–14.6)

## 2012-11-14 LAB — LIPID PANEL
HDL: 33.8 mg/dL — ABNORMAL LOW (ref >39.00)
LDL Cholesterol: 96 mg/dL (ref 0–99)
Triglycerides: 68 mg/dL (ref 0.0–149.0)
VLDL: 13.6 mg/dL (ref 0.0–40.0)

## 2012-11-14 MED ORDER — HYDROCHLOROTHIAZIDE 25 MG PO TABS: 12.5000 mg | ORAL_TABLET | Freq: Every day | ORAL | Status: DC

## 2012-11-14 MED ORDER — LOSARTAN POTASSIUM 100 MG PO TABS: 100.0000 mg | ORAL_TABLET | Freq: Every day | ORAL | Status: DC

## 2012-11-14 NOTE — Assessment & Plan Note (Signed)
Discussed how this problem influences overall health and the risks it imposes  Reviewed plan for weight loss with lower calorie diet (via better food choices and also portion control or program like weight watchers) and exercise building up to or more than 30 minutes 5 days per week including some aerobic activity   Pt making a good effort and committed to loose the wt now

## 2012-11-14 NOTE — Progress Notes (Signed)
Subjective:    Patient ID: Daniel [name], male    DOB: 1966/09/18, 47 y.o.   MRN: [id number]  HPI Here for f/u of chronic medical problems   Has had a headache lately since he ran out of bp med  Also gained 10 lb this year  He started running on a treadmill   He is committed to loosing the weight   Is also eating less and smarter Cut out sodas/ bread and potato  Beer just once in a while   Still smoking - but really wants to quit -- he decided to do it cold [place]  Will finish the 2 packs he has left and just lie them down In past has quit once for 41/2 months   Had flu shot at work - oct   Patient Active Problem List  Diagnosis  . VITAMIN B12 DEFICIENCY  . ERECTILE DYSFUNCTION  . TOBACCO ABUSE  . HYPERTENSION, BENIGN ESSENTIAL  . LOW BACK PAIN, CHRONIC  . Obesity   Past Medical History  Diagnosis Date  . Hypertension   . Ruptured disk    Past Surgical History  Procedure Date  . Back surgery     ruptured disk, L-S  . Cardiac catheterization 05/2004    minimal CAD   History  Substance Use Topics  . Smoking status: Current Every Day Smoker -- 1.0 packs/day    Types: Cigarettes  . Smokeless tobacco: Not on file  . Alcohol Use: Yes     Comment: occasional   Family History  Problem Relation Age of Onset  . Hypertension Father   . Heart disease Father     MI   Allergies  Allergen Reactions  . Ace Inhibitors     REACTION: cough   Current Outpatient Prescriptions on File Prior to Visit  Medication Sig Dispense Refill  . hydrochlorothiazide (HYDRODIURIL) 25 MG tablet TAKE 1/2 TABLET BY MOUTH EVERY DAY  30 tablet  0  . HYDROcodone-acetaminophen (VICODIN) 5-500 MG per tablet Take 1 tablet by mouth every 6 (six) hours as needed. Take 1-2 tabs by mouth two times a day as needed for pain.       Marland Kitchen losartan (COZAAR) 100 MG tablet Take 1 tablet (100 mg total) by mouth daily.  30 tablet  11  . sildenafil (VIAGRA) 50 MG tablet Take 50 mg by mouth daily as needed.  Take one by mouth once daily as directed as needed 30 minutes before sexual activity.          Review of Systems Review of Systems  Constitutional: Negative for fever, appetite change, fatigue and unexpected weight change.  Eyes: Negative for pain and visual disturbance.  Respiratory: Negative for cough and shortness of breath.   Cardiovascular: Negative for cp or palpitations    Gastrointestinal: Negative for nausea, diarrhea and constipation.  Genitourinary: Negative for urgency and frequency.  Skin: Negative for pallor or rash   Neurological: Negative for weakness, light-headedness, numbness and pos for ha when off bp med  Hematological: Negative for adenopathy. Does not bruise/bleed easily.  Psychiatric/Behavioral: Negative for dysphoric mood. The patient is not nervous/anxious.         Objective:   Physical Exam  Constitutional: He appears well-developed and well-nourished. No distress.       obese and well appearing   HENT:  Head: Normocephalic and atraumatic.  Mouth/Throat: Oropharynx is clear and moist. No oropharyngeal exudate.  Eyes: Conjunctivae normal and EOM are normal. Pupils are equal, round, and reactive to  light. Right eye exhibits no discharge. Left eye exhibits no discharge. No scleral icterus.  Neck: Normal range of motion. Neck supple. No JVD present. Carotid bruit is not present. No thyromegaly present.  Cardiovascular: Normal rate, regular rhythm and intact distal pulses.  Exam reveals no gallop.   Pulmonary/Chest: Effort normal and breath sounds normal. No respiratory distress. He has no wheezes.  Abdominal: Soft. Bowel sounds are normal. He exhibits no distension, no abdominal bruit and no mass. There is no tenderness.  Musculoskeletal: He exhibits no edema and no tenderness.  Lymphadenopathy:    He has no cervical adenopathy.  Neurological: He is alert. He has normal reflexes. No cranial nerve deficit. He exhibits normal muscle tone. Coordination normal.    Skin: Skin is warm and dry. No rash noted. No erythema. No pallor.  Psychiatric: He has a normal mood and affect.          Assessment & Plan:

## 2012-11-14 NOTE — Assessment & Plan Note (Signed)
Disc in detail risks of smoking and possible outcomes including copd, vascular/ heart disease, cancer , respiratory and sinus infections  Pt voices understanding  He is ready to quit cold [place] when he finishes his last pack Commended on that

## 2012-11-14 NOTE — Patient Instructions (Addendum)
Labs today Good luck quitting smoking  Keep up the good work with diet and exercise for weight loss  Follow up in 6 months  Tetanus shot today

## 2012-11-14 NOTE — Assessment & Plan Note (Signed)
bp much better on 2nd check  Had been off med for a while meds refilled  Lab today Making a good effort for wt loss now-commended

## 2012-11-17 ENCOUNTER — Ambulatory Visit: Payer: PRIVATE HEALTH INSURANCE

## 2012-11-17 ENCOUNTER — Encounter: Payer: Self-pay | Admitting: *Deleted

## 2012-11-17 DIAGNOSIS — R7309 Other abnormal glucose: Secondary | ICD-10-CM

## 2013-03-23 ENCOUNTER — Ambulatory Visit: Payer: PRIVATE HEALTH INSURANCE | Admitting: Family Medicine

## 2013-05-15 ENCOUNTER — Encounter: Payer: Self-pay | Admitting: Family Medicine

## 2013-05-15 ENCOUNTER — Ambulatory Visit (INDEPENDENT_AMBULATORY_CARE_PROVIDER_SITE_OTHER): Payer: PRIVATE HEALTH INSURANCE | Admitting: Family Medicine

## 2013-05-15 VITALS — BP 125/80 | HR 98 | Temp 98.4°F | Ht 69.0 in | Wt 275.0 lb

## 2013-05-15 DIAGNOSIS — F17200 Nicotine dependence, unspecified, uncomplicated: Secondary | ICD-10-CM

## 2013-05-15 DIAGNOSIS — I10 Essential (primary) hypertension: Secondary | ICD-10-CM

## 2013-05-15 DIAGNOSIS — E669 Obesity, unspecified: Secondary | ICD-10-CM

## 2013-05-15 NOTE — Assessment & Plan Note (Signed)
Improved on his cozaar - no more headaches  Will continue this Is dedicated to wt loss - and is back to exercise as well  Disc dash diet and sodium control in diet -handout given  F/u 6 mo for annual exam

## 2013-05-15 NOTE — Assessment & Plan Note (Signed)
Discussed how this problem influences overall health and the risks it imposes  Reviewed plan for weight loss with lower calorie diet (via better food choices and also portion control or program like weight watchers) and exercise building up to or more than 30 minutes 5 days per week including some aerobic activity    He is motivated again - disc goals for wt loss and hope that this will lower bp

## 2013-05-15 NOTE — Progress Notes (Signed)
  Subjective:    Patient ID: Daniel [name], male    DOB: January 18, 1966, 47 y.o.   MRN: [id number]  HPI Here for f/u of chronic medical problems   Wt is down 3 lb with bmi of 40 He lost 25 lb with treadmill when it broke and then gained it back  Got it fixed and back on track- aiming for 3 miles per day -and will inc from there  Eating more green veggies/ more water and no sodas  Is tough for him   bp is stable today  No cp or palpitations or headaches or edema   (no more headaches)  No missed doses  No side effects to medicines  BP Readings from Last 3 Encounters:  05/15/13 126/86  11/14/12 135/85  03/02/11 136/84    On cozaar and hctz now     Chemistry      Component Value Date/Time   NA 138 11/14/2012 1443   K 4.9 11/14/2012 1443   CL 103 11/14/2012 1443   CO2 30 11/14/2012 1443   BUN 15 11/14/2012 1443   CREATININE 1.1 11/14/2012 1443   CREATININE 1.11 03/02/2011 1625      Component Value Date/Time   CALCIUM 9.3 11/14/2012 1443   ALKPHOS 119* 11/14/2012 1443   AST 30 11/14/2012 1443   ALT 48 11/14/2012 1443   BILITOT 0.4 11/14/2012 1443       Smoking - has cut down to 1/2 ppd  Is interested in e cig  He does crave nicotine  He cannot smoke at work- that has helped    Review of Systems Review of Systems  Constitutional: Negative for fever, appetite change, fatigue and unexpected weight change.  Eyes: Negative for pain and visual disturbance.  Respiratory: Negative for cough and shortness of breath.   Cardiovascular: Negative for cp or palpitations    Gastrointestinal: Negative for nausea, diarrhea and constipation.  Genitourinary: Negative for urgency and frequency.  Skin: Negative for pallor or rash   Neurological: Negative for weakness, light-headedness, numbness and headaches.  Hematological: Negative for adenopathy. Does not bruise/bleed easily.  Psychiatric/Behavioral: Negative for dysphoric mood. The patient is not nervous/anxious.         Objective:   Physical Exam  Constitutional: He appears well-developed and well-nourished. No distress.  HENT:  Head: Normocephalic and atraumatic.  Mouth/Throat: Oropharynx is clear and moist.  Eyes: Conjunctivae and EOM are normal. Pupils are equal, round, and reactive to light. Right eye exhibits no discharge. Left eye exhibits no discharge. No scleral icterus.  Neck: Normal range of motion. Neck supple. No JVD present. Carotid bruit is not present. No thyromegaly present.  Cardiovascular: Normal rate and regular rhythm.   Pulmonary/Chest: Effort normal and breath sounds normal. No respiratory distress. He has no wheezes. He has no rales.  Diffusely distant bs   Abdominal: Soft. Bowel sounds are normal. He exhibits no distension, no abdominal bruit and no mass. There is no tenderness.  Musculoskeletal: He exhibits no edema and no tenderness.  Lymphadenopathy:    He has no cervical adenopathy.  Neurological: He is alert. He has normal reflexes. No cranial nerve deficit. He exhibits normal muscle tone. Coordination normal.  Skin: Skin is warm and dry. No rash noted. No erythema. No pallor.  Psychiatric: He has a normal mood and affect.          Assessment & Plan:

## 2013-05-15 NOTE — Patient Instructions (Addendum)
bp is looking good and I'm glad the headaches are better  Keep working hard at weight loss  Exercise at least 30 min five days a week  Also cut portions and eat healthier foods - I'm glad you are not drinking soft drinks Good luck quitting smoking !  Follow up with me in 6 mo for annual exam with labs prior

## 2013-05-15 NOTE — Assessment & Plan Note (Signed)
Disc in detail risks of smoking and possible outcomes including copd, vascular/ heart disease, cancer , respiratory and sinus infections  Pt voices understanding Pt has cut down to 1/2 ppd and plans to quit- disc pros and cons of e cig and nicotine replacement today

## 2013-11-12 ENCOUNTER — Telehealth: Payer: Self-pay | Admitting: Family Medicine

## 2013-11-12 DIAGNOSIS — Z0000 Encounter for general adult medical examination without abnormal findings: Secondary | ICD-10-CM | POA: Insufficient documentation

## 2013-11-12 NOTE — Telephone Encounter (Signed)
Message copied by Judy PimpleWER,  A on Thu Nov 12, 2013  7:47 PM ------      Message from: Alvina ChouWALSH, Daniel [name] J      Created: Wed Nov 04, 2013 10:36 AM      Regarding: Lab orders for Fruday, 1.9.15       Patient is scheduled for CPX labs, please order future labs, Thanks , Daniel [name]       ------

## 2013-11-13 ENCOUNTER — Other Ambulatory Visit: Payer: PRIVATE HEALTH INSURANCE

## 2013-11-16 ENCOUNTER — Ambulatory Visit (INDEPENDENT_AMBULATORY_CARE_PROVIDER_SITE_OTHER): Payer: PRIVATE HEALTH INSURANCE | Admitting: Family Medicine

## 2013-11-16 ENCOUNTER — Encounter: Payer: Self-pay | Admitting: Family Medicine

## 2013-11-16 VITALS — BP 128/72 | HR 67 | Temp 98.2°F | Ht 69.0 in | Wt 273.5 lb

## 2013-11-16 DIAGNOSIS — E669 Obesity, unspecified: Secondary | ICD-10-CM

## 2013-11-16 DIAGNOSIS — I10 Essential (primary) hypertension: Secondary | ICD-10-CM

## 2013-11-16 DIAGNOSIS — Z23 Encounter for immunization: Secondary | ICD-10-CM

## 2013-11-16 DIAGNOSIS — Z0000 Encounter for general adult medical examination without abnormal findings: Secondary | ICD-10-CM

## 2013-11-16 DIAGNOSIS — F17200 Nicotine dependence, unspecified, uncomplicated: Secondary | ICD-10-CM

## 2013-11-16 DIAGNOSIS — E538 Deficiency of other specified B group vitamins: Secondary | ICD-10-CM

## 2013-11-16 DIAGNOSIS — N400 Enlarged prostate without lower urinary tract symptoms: Secondary | ICD-10-CM

## 2013-11-16 LAB — LIPID PANEL
CHOLESTEROL: 172 mg/dL (ref 0–200)
HDL: 43.6 mg/dL (ref >39.00)
LDL Cholesterol: 115 mg/dL — ABNORMAL HIGH (ref 0–99)
Total CHOL/HDL Ratio: 4
Triglycerides: 69 mg/dL (ref 0.0–149.0)
VLDL: 13.8 mg/dL (ref 0.0–40.0)

## 2013-11-16 LAB — CBC WITH DIFFERENTIAL/PLATELET
BASOS PCT: 0.5 % (ref 0.0–3.0)
Basophils Absolute: 0 10*3/uL (ref 0.0–0.1)
EOS PCT: 5.9 % — AB (ref 0.0–5.0)
Eosinophils Absolute: 0.5 10*3/uL (ref 0.0–0.7)
HEMATOCRIT: 45.4 % (ref 39.0–52.0)
HEMOGLOBIN: 16 g/dL (ref 13.0–17.0)
LYMPHS PCT: 24.4 % (ref 12.0–46.0)
Lymphs Abs: 2.2 10*3/uL (ref 0.7–4.0)
MCHC: 35.2 g/dL (ref 30.0–36.0)
MCV: 95.8 fl (ref 78.0–100.0)
Monocytes Absolute: 0.7 10*3/uL (ref 0.1–1.0)
Monocytes Relative: 8.1 % (ref 3.0–12.0)
Neutro Abs: 5.6 10*3/uL (ref 1.4–7.7)
Neutrophils Relative %: 61.1 % (ref 43.0–77.0)
Platelets: 163 10*3/uL (ref 150.0–400.0)
RBC: 4.74 Mil/uL (ref 4.22–5.81)
RDW: 12 % (ref 11.5–14.6)
WBC: 9.2 10*3/uL (ref 4.5–10.5)

## 2013-11-16 LAB — COMPREHENSIVE METABOLIC PANEL
ALBUMIN: 4.1 g/dL (ref 3.5–5.2)
ALK PHOS: 99 U/L (ref 39–117)
ALT: 54 U/L — ABNORMAL HIGH (ref 0–53)
AST: 37 U/L (ref 0–37)
BUN: 11 mg/dL (ref 6–23)
CALCIUM: 9.5 mg/dL (ref 8.4–10.5)
CHLORIDE: 103 meq/L (ref 96–112)
CO2: 28 mEq/L (ref 19–32)
Creatinine, Ser: 0.9 mg/dL (ref 0.4–1.5)
GFR: 95.05 mL/min (ref >60.00)
Glucose, Bld: 87 mg/dL (ref 70–99)
POTASSIUM: 4.7 meq/L (ref 3.5–5.1)
SODIUM: 140 meq/L (ref 135–145)
TOTAL PROTEIN: 7 g/dL (ref 6.0–8.3)
Total Bilirubin: 0.6 mg/dL (ref 0.3–1.2)

## 2013-11-16 LAB — TSH: TSH: 2.71 u[IU]/mL (ref 0.35–5.50)

## 2013-11-16 LAB — VITAMIN B12: VITAMIN B 12: 284 pg/mL (ref 211–911)

## 2013-11-16 LAB — PSA: PSA: 1.27 ng/mL (ref 0.10–4.00)

## 2013-11-16 MED ORDER — HYDROCHLOROTHIAZIDE 25 MG PO TABS: 12.5000 mg | ORAL_TABLET | Freq: Every day | ORAL | Status: DC

## 2013-11-16 MED ORDER — LOSARTAN POTASSIUM 100 MG PO TABS: 100.0000 mg | ORAL_TABLET | Freq: Every day | ORAL | Status: DC

## 2013-11-16 NOTE — Assessment & Plan Note (Signed)
Discussed how this problem influences overall health and the risks it imposes  Reviewed plan for weight loss with lower calorie diet (via better food choices and also portion control or program like weight watchers) and exercise building up to or more than 30 minutes 5 days per week including some aerobic activity   Pt is ready to get back on track with treadmill and diet

## 2013-11-16 NOTE — Assessment & Plan Note (Signed)
Reviewed health habits including diet and exercise and skin cancer prevention Reviewed appropriate screening tests for age  Also reviewed health mt list, fam hx and immunization status , as well as social and family history   See HPI Lab today 

## 2013-11-16 NOTE — Progress Notes (Signed)
Subjective:    Patient ID: Daniel [name], male    DOB: Jul 31, 1966, 48 y.o.   MRN: [id number]  HPI Here for health maintenance exam and to review chronic medical problems   Is doing well   Wt is down 2 lb with bmi of 40 Lost wt down to 250 lb -- and then gained it back  Was walking on treadmill / some running and ate salads and much healthier  Getting ready to start back   Flu vaccine 11/14  Td 1/14   Never had a pneumonia vaccine Would like to have one today   Smoking status - he has quit on and off / cold [place]-- it is difficult  Is thinking about quitting again- it is very  Hard   Due for labs  B12 def- not taking any vitamins with B12   bp is stable today  No cp or palpitations or headaches or edema  No side effects to medicines  BP Readings from Last 3 Encounters:  11/16/13 128/72  05/15/13 125/80  11/14/12 135/85     Prostate- does not empty all the way- more frequent Nocturia - 2 times  Has not had a psa    Mood -- no depression / good motivation   Patient Active Problem List   Diagnosis Date Noted  . BPH (benign prostatic hyperplasia) 11/16/2013  . Routine general medical examination at a health care facility 11/12/2013  . Obesity 03/02/2011  . VITAMIN B12 DEFICIENCY 09/29/2007  . ERECTILE DYSFUNCTION 07/01/2007  . TOBACCO ABUSE 07/01/2007  . LOW BACK PAIN, CHRONIC 07/01/2007  . HYPERTENSION, BENIGN ESSENTIAL 06/03/2007   Past Medical History  Diagnosis Date  . Hypertension   . Ruptured disk    Past Surgical History  Procedure Laterality Date  . Back surgery      ruptured disk, L-S  . Cardiac catheterization  05/2004    minimal CAD   History  Substance Use Topics  . Smoking status: Current Every Day Smoker -- 0.50 packs/day    Types: Cigarettes  . Smokeless tobacco: Not on file  . Alcohol Use: Yes     Comment: occasional   Family History  Problem Relation Age of Onset  . Hypertension Father   . Heart disease Father     MI    Allergies  Allergen Reactions  . Ace Inhibitors     REACTION: cough   Current Outpatient Prescriptions on File Prior to Visit  Medication Sig Dispense Refill  . sildenafil (VIAGRA) 50 MG tablet Take 50 mg by mouth daily as needed. Take one by mouth once daily as directed as needed 30 minutes before sexual activity.        No current facility-administered medications on file prior to visit.       Review of Systems Review of Systems  Constitutional: Negative for fever, appetite change, fatigue and unexpected weight change.  Eyes: Negative for pain and visual disturbance.  Respiratory: Negative for cough and shortness of breath.   Cardiovascular: Negative for cp or palpitations    Gastrointestinal: Negative for nausea, diarrhea and constipation.  Genitourinary: Negative for urgency and frequency.  Skin: Negative for pallor or rash   Neurological: Negative for weakness, light-headedness, numbness and headaches.  Hematological: Negative for adenopathy. Does not bruise/bleed easily.  Psychiatric/Behavioral: Negative for dysphoric mood. The patient is not nervous/anxious.         Objective:   Physical Exam  Constitutional: He is oriented to person, place, and time. He appears  well-developed and well-nourished. No distress.  obese and well appearing   HENT:  Head: Normocephalic and atraumatic.  Right Ear: External ear normal.  Left Ear: External ear normal.  Nose: Nose normal.  Mouth/Throat: Oropharynx is clear and moist.  Eyes: Conjunctivae and EOM are normal. Pupils are equal, round, and reactive to light. Right eye exhibits no discharge. Left eye exhibits no discharge. No scleral icterus.  Neck: Normal range of motion. Neck supple. No JVD present. Carotid bruit is not present. No thyromegaly present.  Cardiovascular: Normal rate, regular rhythm, normal heart sounds and intact distal pulses.  Exam reveals no gallop.   Pulmonary/Chest: Effort normal and breath sounds normal. No  respiratory distress. He has no wheezes. He has no rales.  Abdominal: Soft. Bowel sounds are normal. He exhibits no distension, no abdominal bruit and no mass. There is no tenderness.  Obese abdomen  Genitourinary: Prostate is enlarged.  Mildly enl prostate  Symmetric and firm and nt   Musculoskeletal: Normal range of motion. He exhibits no edema and no tenderness.  Lymphadenopathy:    He has no cervical adenopathy.  Neurological: He is alert and oriented to person, place, and time. He has normal reflexes. No cranial nerve deficit. He exhibits normal muscle tone. Coordination normal.  Skin: Skin is warm and dry. No rash noted. No erythema. No pallor.  Some SKs on trunk and face  Ruddy complexion  Psychiatric: He has a normal mood and affect.          Assessment & Plan:

## 2013-11-16 NOTE — Progress Notes (Signed)
Pre-visit discussion using our clinic review tool. No additional management support is needed unless otherwise documented below in the visit note.  

## 2013-11-16 NOTE — Assessment & Plan Note (Signed)
Level today No supplements currently

## 2013-11-16 NOTE — Assessment & Plan Note (Signed)
BP: 128/72 mmHg  bp in fair control at this time  No changes needed Disc lifstyle change with low sodium diet and exercise

## 2013-11-16 NOTE — Patient Instructions (Signed)
Take care of yourself  Work on diet and exercise - keep working on diet and exercise  Keep thinking about quitting smoking  Pneumonia vaccine today Labs today

## 2013-11-16 NOTE — Assessment & Plan Note (Signed)
Pt having some frequent voiding and nocturia  He wants to try saw palemetto before any px products as symptoms are mild  DRE done psa pending

## 2013-11-16 NOTE — Assessment & Plan Note (Signed)
Disc in detail risks of smoking and possible outcomes including copd, vascular/ heart disease, cancer , respiratory and sinus infections  Pt voices understanding He is not ready to quit-but close, and pref to do it cold [place]

## 2013-11-18 ENCOUNTER — Encounter: Payer: Self-pay | Admitting: *Deleted

## 2013-12-05 ENCOUNTER — Telehealth: Payer: Self-pay | Admitting: Family Medicine

## 2013-12-05 NOTE — Telephone Encounter (Signed)
Relevant patient education mailed to patient.  

## 2013-12-09 ENCOUNTER — Telehealth: Payer: Self-pay | Admitting: Family Medicine

## 2013-12-09 NOTE — Telephone Encounter (Signed)
Relevant patient education mailed to patient.  

## 2014-03-10 ENCOUNTER — Encounter: Payer: Self-pay | Admitting: Family Medicine

## 2014-03-10 ENCOUNTER — Ambulatory Visit (INDEPENDENT_AMBULATORY_CARE_PROVIDER_SITE_OTHER): Payer: PRIVATE HEALTH INSURANCE | Admitting: Family Medicine

## 2014-03-10 VITALS — BP 140/78 | HR 64 | Temp 98.2°F | Ht 69.0 in | Wt 276.8 lb

## 2014-03-10 DIAGNOSIS — H8110 Benign paroxysmal vertigo, unspecified ear: Secondary | ICD-10-CM | POA: Insufficient documentation

## 2014-03-10 MED ORDER — MECLIZINE HCL 25 MG PO TABS: 25.0000 mg | ORAL_TABLET | Freq: Three times a day (TID) | ORAL | Status: DC | PRN

## 2014-03-10 NOTE — Progress Notes (Signed)
Pre visit review using our clinic review tool, if applicable. No additional management support is needed unless otherwise documented below in the visit note. 

## 2014-03-10 NOTE — Progress Notes (Signed)
Subjective:    Patient ID: Daniel [name], male    DOB: 1965-11-08, 48 y.o.   MRN: [id number]  HPI Here for dizzy spells  Last several days on and off - 2 times yest and one this am  Very brief - he sits down and it goes away  Once at grocery store Not in bed   Feels like the room is spinning  ? If positional  ? If inner ear problem His L ear drains - clear fluid-no pain   ? If he has allergies   No speech problem/facial droop or weakness or ha   Patient Active Problem List   Diagnosis Date Noted  . Benign paroxysmal positional vertigo 03/10/2014  . BPH (benign prostatic hyperplasia) 11/16/2013  . Routine general medical examination at a health care facility 11/12/2013  . Obesity 03/02/2011  . VITAMIN B12 DEFICIENCY 09/29/2007  . ERECTILE DYSFUNCTION 07/01/2007  . TOBACCO ABUSE 07/01/2007  . LOW BACK PAIN, CHRONIC 07/01/2007  . HYPERTENSION, BENIGN ESSENTIAL 06/03/2007   Past Medical History  Diagnosis Date  . Hypertension   . Ruptured disk    Past Surgical History  Procedure Laterality Date  . Back surgery      ruptured disk, L-S  . Cardiac catheterization  05/2004    minimal CAD   History  Substance Use Topics  . Smoking status: Current Every Day Smoker -- 0.50 packs/day    Types: Cigarettes  . Smokeless tobacco: Not on file  . Alcohol Use: Yes     Comment: occasional   Family History  Problem Relation Age of Onset  . Hypertension Father   . Heart disease Father     MI   Allergies  Allergen Reactions  . Ace Inhibitors     REACTION: cough   Current Outpatient Prescriptions on File Prior to Visit  Medication Sig Dispense Refill  . hydrochlorothiazide (HYDRODIURIL) 25 MG tablet Take 0.5 tablets (12.5 mg total) by mouth daily.  30 tablet  11  . losartan (COZAAR) 100 MG tablet Take 1 tablet (100 mg total) by mouth daily.  30 tablet  11  . sildenafil (VIAGRA) 50 MG tablet Take 50 mg by mouth daily as needed. Take one by mouth once daily as directed as  needed 30 minutes before sexual activity.        No current facility-administered medications on file prior to visit.    Review of Systems Review of Systems  Constitutional: Negative for fever, appetite change, fatigue and unexpected weight change.  Eyes: Negative for pain and visual disturbance ENt pos for some rhinorrhea / neg for ear pain or hearing loss .  Respiratory: Negative for cough and shortness of breath.   Cardiovascular: Negative for cp or palpitations    Gastrointestinal: Negative for nausea, diarrhea and constipation.  Genitourinary: Negative for urgency and frequency.  Skin: Negative for pallor or rash   Neurological: Negative for weakness,, numbness and headaches. pos for vertigo Hematological: Negative for adenopathy. Does not bruise/bleed easily.  Psychiatric/Behavioral: Negative for dysphoric mood. The patient is not nervous/anxious.         Objective:   Physical Exam  Constitutional: He appears well-developed and well-nourished. No distress.  obese and well appearing   HENT:  Head: Normocephalic and atraumatic.  Right Ear: External ear normal.  Left Ear: External ear normal.  Mouth/Throat: Oropharynx is clear and moist. No oropharyngeal exudate.  Nares are boggy  No sinus or temporal tenderness  Eyes: Conjunctivae and EOM are normal. Pupils  are equal, round, and reactive to light. Right eye exhibits no discharge. Left eye exhibits no discharge. No scleral icterus.  2-3 beats or horizontal nystagmus bilaterally  Neck: Normal range of motion. Neck supple. No JVD present. Carotid bruit is not present. No thyromegaly present.  Cardiovascular: Normal rate, regular rhythm, normal heart sounds and intact distal pulses.  Exam reveals no gallop.   No murmur heard. Pulmonary/Chest: Effort normal and breath sounds normal. No respiratory distress. He has no wheezes.  Diffusely distant bs   Musculoskeletal: He exhibits no edema.  Lymphadenopathy:    He has no cervical  adenopathy.  Neurological: He is alert. He has normal reflexes. He displays no atrophy and no tremor. No cranial nerve deficit or sensory deficit. He exhibits normal muscle tone. He displays no seizure activity. Coordination and gait normal.  No focal cerebellar signs Nl tandem gait           Assessment & Plan:

## 2014-03-10 NOTE — Patient Instructions (Signed)
I think you have some mild vertigo  Be careful not to move too quickly -especially when turning your head If episodes become frequent or prolonged-fill the meclizine and take it as directed  Alert me if worse or not improved in a week or if new symptoms     Vertigo Vertigo means you feel like you or your surroundings are moving when they are not. Vertigo can be dangerous if it occurs when you are at work, driving, or performing difficult activities.  CAUSES  Vertigo occurs when there is a conflict of signals sent to your brain from the visual and sensory systems in your body. There are many different causes of vertigo, including:  Infections, especially in the inner ear.  A bad reaction to a drug or misuse of alcohol and medicines.  Withdrawal from drugs or alcohol.  Rapidly changing positions, such as lying down or rolling over in bed.  A migraine headache.  Decreased blood flow to the brain.  Increased pressure in the brain from a head injury, infection, tumor, or bleeding. SYMPTOMS  You may feel as though the world is spinning around or you are falling to the ground. Because your balance is upset, vertigo can cause nausea and vomiting. You may have involuntary eye movements (nystagmus). DIAGNOSIS  Vertigo is usually diagnosed by physical exam. If the cause of your vertigo is unknown, your caregiver may perform imaging tests, such as an MRI scan (magnetic resonance imaging). TREATMENT  Most cases of vertigo resolve on their own, without treatment. Depending on the cause, your caregiver may prescribe certain medicines. If your vertigo is related to body position issues, your caregiver may recommend movements or procedures to correct the problem. In rare cases, if your vertigo is caused by certain inner ear problems, you may need surgery. HOME CARE INSTRUCTIONS   Follow your caregiver's instructions.  Avoid driving.  Avoid operating heavy machinery.  Avoid performing any tasks  that would be dangerous to you or others during a vertigo episode.  Tell your caregiver if you notice that certain medicines seem to be causing your vertigo. Some of the medicines used to treat vertigo episodes can actually make them worse in some people. SEEK IMMEDIATE MEDICAL CARE IF:   Your medicines do not relieve your vertigo or are making it worse.  You develop problems with talking, walking, weakness, or using your arms, hands, or legs.  You develop severe headaches.  Your nausea or vomiting continues or gets worse.  You develop visual changes.  A family member notices behavioral changes.  Your condition gets worse. MAKE SURE YOU:  Understand these instructions.  Will watch your condition.  Will get help right away if you are not doing well or get worse. Document Released: 08/01/2005 Document Revised: 01/14/2012 Document Reviewed: 05/10/2011 [facility] Patient Information 2014 [place], [place].

## 2014-03-11 NOTE — Assessment & Plan Note (Signed)
Mild- and episodes are fleeting/quick Nl vitals and reassuring exam Px for meclizine - for use if worse or persistent-disc sedation potential Disc making pos change slowly Update if not starting to improve in a week or if worsening

## 2014-04-06 ENCOUNTER — Ambulatory Visit (INDEPENDENT_AMBULATORY_CARE_PROVIDER_SITE_OTHER): Payer: PRIVATE HEALTH INSURANCE | Admitting: Family Medicine

## 2014-04-06 ENCOUNTER — Encounter: Payer: Self-pay | Admitting: Family Medicine

## 2014-04-06 ENCOUNTER — Ambulatory Visit (INDEPENDENT_AMBULATORY_CARE_PROVIDER_SITE_OTHER)
Admission: RE | Admit: 2014-04-06 | Discharge: 2014-04-06 | Disposition: A | Discharge status: Home / Self Care | Payer: PRIVATE HEALTH INSURANCE | Source: Ambulatory Visit | Attending: Family Medicine | Admitting: Family Medicine

## 2014-04-06 VITALS — BP 156/84 | HR 61 | Temp 98.0°F | Ht 69.0 in | Wt 274.0 lb

## 2014-04-06 DIAGNOSIS — M25561 Pain in right knee: Secondary | ICD-10-CM

## 2014-04-06 DIAGNOSIS — M25569 Pain in unspecified knee: Secondary | ICD-10-CM

## 2014-04-06 NOTE — Progress Notes (Signed)
Pre visit review using our clinic review tool, if applicable. No additional management support is needed unless otherwise documented below in the visit note. 

## 2014-04-06 NOTE — Progress Notes (Signed)
Subjective:    Patient ID: Daniel [name], male    DOB: 03/18/1966, 48 y.o.   MRN: [id number]  HPI R knee pain - since Sunday  Got so bad yesterday his range of motion was limited  May be a little swollen -unsure  No injury or fall  No hx of knee problems   Hurts worst to flex fully   Ibuprofen at night helps a bit  Icy hot did not help   Knee sometimes pops and feels unstable   No bruising   Patient Active Problem List   Diagnosis Date Noted  . Right knee pain 04/06/2014  . Benign paroxysmal positional vertigo 03/10/2014  . BPH (benign prostatic hyperplasia) 11/16/2013  . Routine general medical examination at a health care facility 11/12/2013  . Obesity 03/02/2011  . VITAMIN B12 DEFICIENCY 09/29/2007  . ERECTILE DYSFUNCTION 07/01/2007  . TOBACCO ABUSE 07/01/2007  . LOW BACK PAIN, CHRONIC 07/01/2007  . HYPERTENSION, BENIGN ESSENTIAL 06/03/2007   Past Medical History  Diagnosis Date  . Hypertension   . Ruptured disk    Past Surgical History  Procedure Laterality Date  . Back surgery      ruptured disk, L-S  . Cardiac catheterization  05/2004    minimal CAD   History  Substance Use Topics  . Smoking status: Current Every Day Smoker -- 0.50 packs/day    Types: Cigarettes  . Smokeless tobacco: Not on file  . Alcohol Use: Yes     Comment: occasional   Family History  Problem Relation Age of Onset  . Hypertension Father   . Heart disease Father     MI   Allergies  Allergen Reactions  . Ace Inhibitors     REACTION: cough   Current Outpatient Prescriptions on File Prior to Visit  Medication Sig Dispense Refill  . hydrochlorothiazide (HYDRODIURIL) 25 MG tablet Take 0.5 tablets (12.5 mg total) by mouth daily.  30 tablet  11  . losartan (COZAAR) 100 MG tablet Take 1 tablet (100 mg total) by mouth daily.  30 tablet  11  . meclizine (ANTIVERT) 25 MG tablet Take 1 tablet (25 mg total) by mouth 3 (three) times daily as needed for dizziness (watch out for  sedation).  30 tablet  0  . sildenafil (VIAGRA) 50 MG tablet Take 50 mg by mouth daily as needed. Take one by mouth once daily as directed as needed 30 minutes before sexual activity.        No current facility-administered medications on file prior to visit.    Review of Systems Review of Systems  Constitutional: Negative for fever, appetite change, fatigue and unexpected weight change.  Eyes: Negative for pain and visual disturbance.  Respiratory: Negative for cough and shortness of breath.   Cardiovascular: Negative for cp or palpitations    Gastrointestinal: Negative for nausea, diarrhea and constipation.  Genitourinary: Negative for urgency and frequency.  Skin: Negative for pallor or rash   MSK pos for knee pain / neg for other joint complaints  Neurological: Negative for weakness, light-headedness, numbness and headaches.  Hematological: Negative for adenopathy. Does not bruise/bleed easily.  Psychiatric/Behavioral: Negative for dysphoric mood. The patient is not nervous/anxious.         Objective:   Physical Exam  Constitutional: He appears well-developed and well-nourished. No distress.  obese and well appearing   HENT:  Head: Normocephalic and atraumatic.  Neck: Normal range of motion. Neck supple.  Cardiovascular: Normal rate and regular rhythm.   Musculoskeletal:  He exhibits edema and tenderness.       Right knee: He exhibits decreased range of motion and swelling. He exhibits no ecchymosis, no deformity, no erythema, normal alignment, no LCL laxity, normal patellar mobility, normal meniscus and no MCL laxity. Tenderness found. Medial joint line and patellar tendon tenderness noted.  Crepitus noted  Slight effusion if any Can bear full weight with pain  Flex limited to 90 deg     Skin: Skin is warm and dry. No rash noted. No erythema.  Psychiatric: He has a normal mood and affect.          Assessment & Plan:   Problem List Items Addressed This Visit      Other   Right knee pain - Primary     No injury but acute symptoms  Overuse/ strain or early OA are in the differential  xr today-will update then further plan Elevate/ice /neoprene knee sleeve prn      Relevant Orders      DG Knee Complete 4 Views Right (Completed)

## 2014-04-06 NOTE — Patient Instructions (Signed)
Wear the knee sleeve/brace that you have (with the hole in it)- when up and about  Elevate knee when able and use cold compress Xray now  We will call you with a plan

## 2014-04-08 NOTE — Assessment & Plan Note (Signed)
No injury but acute symptoms  Overuse/ strain or early OA are in the differential  xr today-will update then further plan Elevate/ice /neoprene knee sleeve prn

## 2014-04-16 ENCOUNTER — Ambulatory Visit (INDEPENDENT_AMBULATORY_CARE_PROVIDER_SITE_OTHER): Payer: PRIVATE HEALTH INSURANCE | Admitting: Family Medicine

## 2014-04-16 ENCOUNTER — Encounter: Payer: Self-pay | Admitting: Family Medicine

## 2014-04-16 VITALS — BP 160/90 | HR 65 | Temp 97.8°F | Ht 69.0 in | Wt 274.0 lb

## 2014-04-16 DIAGNOSIS — M2390 Unspecified internal derangement of unspecified knee: Secondary | ICD-10-CM

## 2014-04-16 DIAGNOSIS — M25569 Pain in unspecified knee: Secondary | ICD-10-CM

## 2014-04-16 DIAGNOSIS — M2391 Unspecified internal derangement of right knee: Secondary | ICD-10-CM

## 2014-04-16 DIAGNOSIS — M25561 Pain in right knee: Secondary | ICD-10-CM

## 2014-04-16 NOTE — Progress Notes (Signed)
[address] [place] [address] Phone: [phone] Fax: [phone]  Patient ID: Daniel [name] MRN: [id number], DOB: Aug 14, 1966, 48 y.o. Date of Encounter: 04/16/2014  Primary Physician:  Roxy MannsMarne Tower, MD   Chief Complaint: Knee Pain   Subjective:   Dear Dr. Milinda Antisower,  Thank you for having me see Daniel [name] in consultation today [facility] for his problem with RIGHT knee pain.  As you may recall, he is a 48 y.o. year old male with a history of RIGHT knee pain for the last 3 weeks. He does not have any kind of specific injury or trauma that he can recall. Patient does feel quite tight around his RIGHT knee, and he is having some difficulty with movement. He does have an effusion.  Recent knee films were reviewed in the office and discussed with patient face-to-face. They only show some mild tricompartmental changes only. There is no evidence for any loose body. No occult fracture or dislocation.  He has never had any prior significant fracture around this joint, and he has never had any previous operative intervention.  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 Sometimes up in the air in the middle of the bridge.   Past Medical History  Diagnosis Date  . Hypertension   . Ruptured disk     Past Surgical History  Procedure Laterality Date  . Back surgery      ruptured disk, L-S  . Cardiac catheterization  05/2004    minimal CAD    History   Social History  . Marital Status: Married    Spouse Name: N/A    Number of Children: 1  . Years of Education: N/A   Occupational History  . Haematologiststeel worker    Social History Main Topics  . Smoking status: Current Every Day Smoker -- 0.50 packs/day    Types: Cigarettes  . Smokeless tobacco: Never Used  . Alcohol Use: Yes     Comment: occasional  . Drug Use: No  . Sexual Activity: None   Other Topics Concern  . None   Social History Narrative    . None    Family History  Problem Relation Age of Onset  . Hypertension Father   . Heart disease Father     MI    Medications and Allergies reviewed  Review of Systems:    GEN: No fevers, chills. Nontoxic. Primarily MSK c/o today. MSK: Detailed in the HPI GI: tolerating PO intake without difficulty Neuro: No numbness, parasthesias, or tingling associated. Otherwise the pertinent positives of the ROS are noted above.   Objective:   Physical Examination: BP 160/90  Pulse 65  Temp(Src) 97.8 F (36.6 C) (Oral)  Ht 5\' 9"  (1.753 m)  Wt 274 lb (124.286 kg)  BMI 40.44 kg/m2    GEN: WDWN, NAD, Non-toxic, Alert & Oriented x 3 HEENT: Atraumatic, Normocephalic.  Ears and Nose: No external deformity. EXTR: No clubbing/cyanosis/edema PSYCH: Normally interactive. Conversant. Not depressed or anxious appearing.  Calm demeanor.    RIGHT knee: Full extension, flexion is limited to 90. The patient has a ballotable effusion. Mild to moderate tenderness on the medial joint line. Lateral joint line is less tender. Nontender on the patella. Nontender at the patellar tendon. Nontender at the quadriceps tendon. Nontender at the anserine bursa.  Stable medial collateral ligament and lateral collateral ligament. Lockman is intact. Negative posterior cruciate ligament testing.  Additional  special testing is equivocal given significant effusion.  Radiology: Dg Knee Complete 4 Views Right  04/06/2014   CLINICAL DATA:  Right knee pain, stiffness.  No trauma.  EXAM: RIGHT KNEE - COMPLETE 4+ VIEW  COMPARISON:  None.  FINDINGS: Normal anatomic alignment. No evidence for acute fracture or dislocation. Mild tricompartmental osteophytosis. Mild medial compartment joint space narrowing. Probable small joint effusion.  IMPRESSION: No acute osseous abnormality.  Degenerative changes.   Electronically Signed   By: Annia Beltrew  Davis M.D.   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  Recommendations: continued conservative management, continued ice, continued anti-inflammatories orally. We'll also aspirate and inject the patient's knee with some corticosteroid with close followup. If he has a reaccumulation of effusion or he still has some significant symptoms, given his occupation, I would recommend further discussions regarding definitive management.  Knee Aspiration and Injection, RIGHT Patient verbally consented; risks, benefits, and alternatives explained including possible infection. Patient prepped with Chloraprep. Ethyl chloride for anesthesia. 10 cc of 1% Lidocaine used in wheal then injected Subcutaneous fashion with 22 gauge needle on lateral approach. Under sterilne conditions, 18 gauge needle used via lateral approach to aspirate 30 cc of yellowish synovial fluid. Then 8 cc of Lidocaine 1% and Depo-Medrol 80 mg injected. Tolerated well, decreased pain, no complications.   We will see the patient back in Return in about 3 weeks (around 05/07/2014).. If not noted, then follow-up as needed.   Thank you for having Daniel [name] see Daniel Smartavid G Hannig in consultation.  Feel free to contact me with any questions.  Signed,  Signed,  Elpidio GaleaSpencer T. , MD, CAQ Sports Medicine   Discontinued Medications   MECLIZINE (ANTIVERT) 25 MG TABLET    Take 1 tablet (25 mg total) by mouth 3 (three) times daily as needed for dizziness (watch out for sedation).   Current Medications at Discharge:   Medication List       This list is accurate as of: 04/16/14 11:59 PM.  Always use your most recent med list.               hydrochlorothiazide 25 MG tablet  Commonly known as:  HYDRODIURIL  Take 0.5 tablets (12.5 mg total) by mouth daily.     losartan 100 MG tablet  Commonly known as:  COZAAR  Take 1 tablet (100 mg total) by mouth daily.     sildenafil 50 MG tablet  Commonly known as:   VIAGRA  Take 50 mg by mouth daily as needed. Take one by mouth once daily as directed as needed 30 minutes before sexual activity.

## 2014-04-16 NOTE — Progress Notes (Signed)
Pre visit review using our clinic review tool, if applicable. No additional management support is needed unless otherwise documented below in the visit note. 

## 2014-04-21 ENCOUNTER — Ambulatory Visit (INDEPENDENT_AMBULATORY_CARE_PROVIDER_SITE_OTHER): Payer: PRIVATE HEALTH INSURANCE | Admitting: Family Medicine

## 2014-04-21 ENCOUNTER — Encounter: Payer: Self-pay | Admitting: Family Medicine

## 2014-04-21 VITALS — BP 148/80 | HR 61 | Temp 98.3°F | Wt 274.0 lb

## 2014-04-21 DIAGNOSIS — M25569 Pain in unspecified knee: Secondary | ICD-10-CM

## 2014-04-21 DIAGNOSIS — M25561 Pain in right knee: Secondary | ICD-10-CM

## 2014-04-21 DIAGNOSIS — M2391 Unspecified internal derangement of right knee: Secondary | ICD-10-CM

## 2014-04-21 DIAGNOSIS — M2390 Unspecified internal derangement of unspecified knee: Secondary | ICD-10-CM

## 2014-04-21 NOTE — Progress Notes (Signed)
[address] [place] [address] Phone: [phone] Fax: [phone]  Patient ID: Daniel CharonDavid G [name] MRN: [id number], DOB: 07/05/1966, 48 y.o. Date of Encounter: 04/21/2014  Primary Physician:  Roxy MannsMarne Tower, MD   Chief Complaint: Knee Pain   Subjective:   The patient is here in followup 5 days after his initial evaluation, after stepping down off of a curb yesterday and having some significant knee pain. Failure to sounds, now he has been limping more since then. He has not had any Significant effusion. Other than his knee actually has felt great up until yesterday. We did aspirate his knee and inject his knee with some corticosteroid and his initial office visit.  04/16/2014 Last OV with Hannah BeatSpencer Copland, MD  Thank you for having me see Daniel [name] in consultation today [facility] for his problem with RIGHT knee pain.  As you may recall, he is a 48 y.o. year old male with a history of RIGHT knee pain for the last 3 weeks. He does not have any kind of specific injury or trauma that he can recall. Patient does feel quite tight around his RIGHT knee, and he is having some difficulty with movement. He does have an effusion.  Recent knee films were reviewed in the office and discussed with patient face-to-face. They only show some mild tricompartmental changes only. There is no evidence for any loose body. No occult fracture or dislocation.  He has never had any prior significant fracture around this joint, and he has never had any previous operative intervention.  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 Sometimes up in the air in the middle of the bridge.   Past Medical History  Diagnosis Date  . Hypertension   . Ruptured disk     Past Surgical History  Procedure Laterality Date  . Back surgery      ruptured disk, L-S  . Cardiac catheterization  05/2004    minimal CAD    History   Social History  .  Marital Status: Married    Spouse Name: N/A    Number of Children: 1  . Years of Education: N/A   Occupational History  . Haematologiststeel worker    Social History Main Topics  . Smoking status: Current Every Day Smoker -- 0.50 packs/day    Types: Cigarettes  . Smokeless tobacco: Never Used  . Alcohol Use: Yes     Comment: occasional  . Drug Use: No  . Sexual Activity: None   Other Topics Concern  . None   Social History Narrative  . None    Family History  Problem Relation Age of Onset  . Hypertension Father   . Heart disease Father     MI    Medications and Allergies reviewed  Review of Systems:    GEN: No fevers, chills. Nontoxic. Primarily MSK c/o today. MSK: Detailed in the HPI GI: tolerating PO intake without difficulty Neuro: No numbness, parasthesias, or tingling associated. Otherwise the pertinent positives of the ROS are noted above.   Objective:   Physical Examination: BP 148/80  Pulse 61  Temp(Src) 98.3 F (36.8 C) (Tympanic)  Wt 274 lb (124.286 kg)  SpO2 98%    GEN: WDWN, NAD, Non-toxic, Alert & Oriented x 3 HEENT: Atraumatic, Normocephalic.  Ears and Nose: No external deformity. EXTR: No clubbing/cyanosis/edema PSYCH: Normally interactive. Conversant. Not depressed or anxious appearing.  Calm demeanor.  Antalgic gait  RIGHT knee: Full extension, flexion to 115. The patient has a minimal effusion. Mild tenderness on the medial joint line. Lateral joint line is less tender. Nontender on the patella. Nontender at the patellar tendon. Nontender at the quadriceps tendon. Nontender at the anserine bursa.  Stable medial collateral ligament and lateral collateral ligament. Lockman is intact. Negative posterior cruciate ligament testing.  mcmurrays is neg.  Radiology: Dg Knee Complete 4 Views Right  04/06/2014   CLINICAL DATA:  Right knee pain, stiffness.  No trauma.  EXAM: RIGHT KNEE - COMPLETE 4+ VIEW  COMPARISON:  None.  FINDINGS: Normal anatomic  alignment. No evidence for acute fracture or dislocation. Mild tricompartmental osteophytosis. Mild medial compartment joint space narrowing. Probable small joint effusion.  IMPRESSION: No acute osseous abnormality.  Degenerative changes.   Electronically Signed   By: Annia Beltrew  Davis M.D.   On: 04/06/2014 14:35    Assessment & Plan:   Right knee pain  Internal derangement of right knee   At this point, I actually recommended that the patient is an MRI of his right knee to evaluate for potential internal derangement or meniscal tear, but he did not want to do that, and alternatively, I placed him in a patellar J. Brace. He is going to do this, continue anti-inflammatories, and relatively rest over the next 3-4 days.  04/16/2014 Last OV with Hannah BeatSpencer Copland, MD  Mild osteoarthritis of the RIGHT knee with probable meniscal tear.  Recommendations: continued conservative management, continued ice, continued anti-inflammatories orally. We'll also aspirate and inject the patient's knee with some corticosteroid with close followup. If he has a reaccumulation of effusion or he still has some significant symptoms, given his occupation, I would recommend further discussions regarding definitive management.  Knee Aspiration and Injection, RIGHT Patient verbally consented; risks, benefits, and alternatives explained including possible infection. Patient prepped with Chloraprep. Ethyl chloride for anesthesia. 10 cc of 1% Lidocaine used in wheal then injected Subcutaneous fashion with 22 gauge needle on lateral approach. Under sterilne conditions, 18 gauge needle used via lateral approach to aspirate 30 cc of yellowish synovial fluid. Then 8 cc of Lidocaine 1% and Depo-Medrol 80 mg injected. Tolerated well, decreased pain, no complications.   We will see the patient back in No Follow-up on file.. If not noted, then follow-up as needed.   Thank you for having us see Daniel [name] in consultation.  Feel free to  contact me with any questions.  Signed,  Signed,  Elpidio GaleaSpencer T. Copland, MD, CAQ Sports Medicine   Discontinued Medications   No medications on file   Current Medications at Discharge:   Medication List       This list is accurate as of: 04/21/14  1:59 PM.  Always use your most recent med list.               hydrochlorothiazide 25 MG tablet  Commonly known as:  HYDRODIURIL  Take 0.5 tablets (12.5 mg total) by mouth daily.     losartan 100 MG tablet  Commonly known as:  COZAAR  Take 1 tablet (100 mg total) by mouth daily.     sildenafil 50 MG tablet  Commonly known as:  VIAGRA  Take 50 mg by mouth daily as needed. Take one by mouth once daily as directed as needed 30 minutes before sexual activity.

## 2014-04-21 NOTE — Progress Notes (Signed)
Pre visit review using our clinic review tool, if applicable. No additional management support is needed unless otherwise documented below in the visit note. 

## 2014-06-30 ENCOUNTER — Ambulatory Visit (INDEPENDENT_AMBULATORY_CARE_PROVIDER_SITE_OTHER): Payer: PRIVATE HEALTH INSURANCE | Admitting: Family Medicine

## 2014-06-30 ENCOUNTER — Encounter: Payer: Self-pay | Admitting: Family Medicine

## 2014-06-30 VITALS — BP 140/88 | HR 60 | Temp 98.2°F | Ht 69.0 in | Wt 269.0 lb

## 2014-06-30 DIAGNOSIS — M2390 Unspecified internal derangement of unspecified knee: Secondary | ICD-10-CM

## 2014-06-30 DIAGNOSIS — M2391 Unspecified internal derangement of right knee: Secondary | ICD-10-CM

## 2014-06-30 NOTE — Progress Notes (Signed)
Pre visit review using our clinic review tool, if applicable. No additional management support is needed unless otherwise documented below in the visit note. 

## 2014-06-30 NOTE — Progress Notes (Signed)
Dr. Karleen Hampshire T. , MD, CAQ Sports Medicine Primary Care and Sports Medicine [address] [place], [address] Phone: [phone] Fax: [phone]  06/30/2014  Patient: Daniel [name], MRN: [id number], DOB: [birth year], 48 y.o.  Primary Physician:  Roxy Manns, MD  Chief Complaint: Knee Pain   Subjective:   The patient is here with ongoing knee pain. Daniel [name] is here again and I saw him about 2 months ago with right-sided knee pain. After that aspirated a large volume from his knee and inject his knee. Daniel [name] has been able to ambulate and go up and down stairs without significant difficulty since that time. Daniel [name] has had a reaccumulation of the fluid and is having some pain with flexion.  04/21/2014 Last OV with Hannah Beat, MD  Thank you for having me see Daniel [name] in consultation today at [facility] for his problem with RIGHT knee pain.  As you may recall, Daniel [name] is a 48 y.o. year old male with a history of RIGHT knee pain for the last 3 weeks. Daniel [name] does not have any kind of specific injury or trauma that Daniel [name] can recall. Patient does feel quite tight around his RIGHT knee, and Daniel [name] is having some difficulty with movement. Daniel [name] does have an effusion.  Recent knee films were reviewed in the office and discussed with patient face-to-face. They only show some mild tricompartmental changes only. There is no evidence for any loose body. No occult fracture or dislocation.  Daniel [name] has never had any prior significant fracture around this joint, and Daniel [name] has never had any previous operative intervention.  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 Sometimes up in the air in the middle of the bridge.   Past Medical History  Diagnosis Date  . Hypertension   . Ruptured disk     Past Surgical History  Procedure Laterality Date  . Back surgery      ruptured disk, L-S  . Cardiac catheterization  05/2004    minimal CAD    History    Social History  . Marital Status: Married    Spouse Name: N/A    Number of Children: 1  . Years of Education: N/A   Occupational History  . Haematologist    Social History Main Topics  . Smoking status: Current Every Day Smoker -- 0.50 packs/day    Types: Cigarettes  . Smokeless tobacco: Never Used  . Alcohol Use: Yes     Comment: occasional  . Drug Use: No  . Sexual Activity: None   Other Topics Concern  . None   Social History Narrative  . None    Family History  Problem Relation Age of Onset  . Hypertension Father   . Heart disease Father     MI    Medications and Allergies reviewed  Review of Systems:    GEN: No fevers, chills. Nontoxic. Primarily MSK c/o today. MSK: Detailed in the HPI GI: tolerating PO intake without difficulty Neuro: No numbness, parasthesias, or tingling associated. Otherwise the pertinent positives of the ROS are noted above.   Objective:   Physical Examination: BP 140/88  Pulse 60  Temp(Src) 98.2 F (36.8 C) (Oral)  Ht  (1.753 m)  Wt 269 lb (122.018 kg)  BMI 39.71 kg/m2    GEN: WDWN, NAD, Non-toxic, Alert & Oriented x 3 HEENT: Atraumatic, Normocephalic.  Ears and Nose: No external deformity. EXTR: No clubbing/cyanosis/edema  PSYCH: Normally interactive. Conversant. Not depressed or anxious appearing.  Calm demeanor.    RIGHT knee: Full extension, flexion is limited to 110. The patient has a mild effusion. Mild to moderate tenderness on the medial joint line. Lateral joint line is less tender. Nontender on the patella. Nontender at the patellar tendon. Nontender at the quadriceps tendon. Nontender at the anserine bursa.  Stable medial collateral ligament and lateral collateral ligament. Lockman is intact. Negative posterior cruciate ligament testing.  Radiology: Dg Knee Complete 4 Views Right  04/06/2014   CLINICAL DATA:  Right knee pain, stiffness.  No trauma.  EXAM: RIGHT KNEE - COMPLETE 4+ VIEW  COMPARISON:  None.   FINDINGS: Normal anatomic alignment. No evidence for acute fracture or dislocation. Mild tricompartmental osteophytosis. Mild medial compartment joint space narrowing. Probable small joint effusion.  IMPRESSION: No acute osseous abnormality.  Degenerative changes.   Electronically Signed   By: Annia Belt M.D.   On: 04/06/2014 14:35    Assessment & Plan:   Derangement, knee internal, right   I suspect Daniel [name] likely has an internal derangement of his knee, most likely torn meniscus. This point is not ready to consider any kind of operative intervention were advanced imaging. I think it is reasonable to re\re aspirate his knee, and I told him if Daniel [name] is still having difficulty in one to 2 months then considering a more definitive plan is appropriate  Knee Aspiration and Injection< R Patient verbally consented; risks, benefits, and alternatives explained including possible infection. Patient prepped with Chloraprep. Ethyl chloride for anesthesia. 10 cc of 1% Lidocaine used in wheal then injected Subcutaneous fashion with 22 gauge needle on lateral approach. Under sterilne conditions, 18 gauge needle used via lateral approach to aspirate 15 cc of serosanguinous fluid. Then 8 cc of Lidocaine 1% and Depo-Medrol 80 mg injected. Tolerated well, decreased pain, no complications.   Signed,  Signed,  [name]. , MD, CAQ Sports Medicine   Discontinued Medications   No medications on file   Current Medications at Discharge:   Medication List       This list is accurate as of: 06/30/14  1:56 PM.  Always use your most recent med list.               hydrochlorothiazide 25 MG tablet  Commonly known as:  HYDRODIURIL  Take 0.5 tablets (12.5 mg total) by mouth daily.     losartan 100 MG tablet  Commonly known as:  COZAAR  Take 1 tablet (100 mg total) by mouth daily.     sildenafil 50 MG tablet  Commonly known as:  VIAGRA  Take 50 mg by mouth daily as needed. Take one by mouth once daily as  directed as needed 30 minutes before sexual activity.

## 2014-11-12 ENCOUNTER — Other Ambulatory Visit: Payer: Self-pay | Admitting: Family Medicine

## 2015-02-26 ENCOUNTER — Observation Stay
Admit: 2015-02-26 | Disposition: A | Discharge status: Home / Self Care | Payer: Self-pay | Attending: Internal Medicine | Admitting: Internal Medicine

## 2015-02-26 LAB — COMPREHENSIVE METABOLIC PANEL
AST: 52 U/L — AB
Albumin: 4.4 g/dL
Alkaline Phosphatase: 112 U/L
Anion Gap: 5 — ABNORMAL LOW (ref 7–16)
BUN: 13 mg/dL
Bilirubin,Total: 0.7 mg/dL
CALCIUM: 9 mg/dL
Chloride: 103 mmol/L
Co2: 26 mmol/L
Creatinine: 0.88 mg/dL
EGFR (African American): >60
EGFR (Non-African Amer.): >60
Glucose: 172 mg/dL — ABNORMAL HIGH
Potassium: 4.3 mmol/L
SGPT (ALT): 57 U/L
SODIUM: 134 mmol/L — AB
TOTAL PROTEIN: 7.4 g/dL

## 2015-02-26 LAB — CBC WITH DIFFERENTIAL/PLATELET
Basophil #: 0.1 10*3/uL (ref 0.0–0.1)
Basophil %: 0.8 %
EOS PCT: 9.3 %
Eosinophil #: 0.6 10*3/uL (ref 0.0–0.7)
HCT: 46.7 % (ref 40.0–52.0)
HGB: 16 g/dL (ref 13.0–18.0)
LYMPHS ABS: 1.6 10*3/uL (ref 1.0–3.6)
Lymphocyte %: 24.1 %
MCH: 33 pg (ref 26.0–34.0)
MCHC: 34.4 g/dL (ref 32.0–36.0)
MCV: 96 fL (ref 80–100)
MONOS PCT: 10 %
Monocyte #: 0.7 x10 3/mm (ref 0.2–1.0)
Neutrophil #: 3.7 10*3/uL (ref 1.4–6.5)
Neutrophil %: 55.8 %
PLATELETS: 141 10*3/uL — AB (ref 150–440)
RBC: 4.85 10*6/uL (ref 4.40–5.90)
RDW: 12.3 % (ref 11.5–14.5)
WBC: 6.6 10*3/uL (ref 3.8–10.6)

## 2015-02-27 LAB — CBC WITH DIFFERENTIAL/PLATELET
BASOS ABS: 0.1 10*3/uL (ref 0.0–0.1)
Basophil %: 0.9 %
EOS PCT: 9.9 %
Eosinophil #: 0.7 10*3/uL (ref 0.0–0.7)
HCT: 43.7 % (ref 40.0–52.0)
HGB: 15.1 g/dL (ref 13.0–18.0)
LYMPHS ABS: 1.8 10*3/uL (ref 1.0–3.6)
Lymphocyte %: 27.6 %
MCH: 33.4 pg (ref 26.0–34.0)
MCHC: 34.6 g/dL (ref 32.0–36.0)
MCV: 97 fL (ref 80–100)
Monocyte #: 0.6 x10 3/mm (ref 0.2–1.0)
Monocyte %: 9.7 %
NEUTROS PCT: 51.9 %
Neutrophil #: 3.5 10*3/uL (ref 1.4–6.5)
PLATELETS: 123 10*3/uL — AB (ref 150–440)
RBC: 4.53 10*6/uL (ref 4.40–5.90)
RDW: 12.2 % (ref 11.5–14.5)
WBC: 6.7 10*3/uL (ref 3.8–10.6)

## 2015-02-27 LAB — BASIC METABOLIC PANEL
ANION GAP: 4 — AB (ref 7–16)
BUN: 16 mg/dL
CALCIUM: 8.6 mg/dL — AB
CHLORIDE: 106 mmol/L
Co2: 26 mmol/L
Creatinine: 1.04 mg/dL
EGFR (African American): >60
GLUCOSE: 132 mg/dL — AB
Potassium: 4 mmol/L
SODIUM: 136 mmol/L

## 2015-02-27 LAB — LIPID PANEL
Cholesterol: 150 mg/dL
HDL: 34 mg/dL — AB
LDL CHOLESTEROL, CALC: 96 mg/dL
Triglycerides: 98 mg/dL
VLDL Cholesterol, Calc: 20 mg/dL

## 2015-02-27 LAB — HEMOGLOBIN A1C: HEMOGLOBIN A1C: 6.1 % — AB

## 2015-03-01 ENCOUNTER — Ambulatory Visit (INDEPENDENT_AMBULATORY_CARE_PROVIDER_SITE_OTHER): Payer: PRIVATE HEALTH INSURANCE | Admitting: Family Medicine

## 2015-03-01 ENCOUNTER — Encounter: Payer: Self-pay | Admitting: Family Medicine

## 2015-03-01 ENCOUNTER — Telehealth: Payer: Self-pay | Admitting: *Deleted

## 2015-03-01 VITALS — BP 140/90 | HR 63 | Temp 97.9°F | Ht 69.0 in | Wt 275.2 lb

## 2015-03-01 DIAGNOSIS — I639 Cerebral infarction, unspecified: Secondary | ICD-10-CM | POA: Insufficient documentation

## 2015-03-01 DIAGNOSIS — I6339 Cerebral infarction due to thrombosis of other cerebral artery: Secondary | ICD-10-CM

## 2015-03-01 DIAGNOSIS — I10 Essential (primary) hypertension: Secondary | ICD-10-CM | POA: Diagnosis not present

## 2015-03-01 DIAGNOSIS — Z87891 Personal history of nicotine dependence: Secondary | ICD-10-CM | POA: Diagnosis not present

## 2015-03-01 DIAGNOSIS — Z8673 Personal history of transient ischemic attack (TIA), and cerebral infarction without residual deficits: Secondary | ICD-10-CM | POA: Insufficient documentation

## 2015-03-01 MED ORDER — AMLODIPINE BESYLATE 5 MG PO TABS: 5.0000 mg | ORAL_TABLET | Freq: Every day | ORAL | Status: DC

## 2015-03-01 MED ORDER — SILDENAFIL CITRATE 50 MG PO TABS: 50.0000 mg | ORAL_TABLET | Freq: Every day | ORAL | Status: DC | PRN

## 2015-03-01 MED ORDER — ATORVASTATIN CALCIUM 10 MG PO TABS: 10.0000 mg | ORAL_TABLET | Freq: Every day | ORAL | Status: DC

## 2015-03-01 NOTE — Patient Instructions (Addendum)
Continue your daily aspirin  Start to work on [employer] diet and exercise  [place] quitting smoking  Start low dose amlodipine for blood pressure  Start low dose atorvastatin to get cholesterol very low - if you get muscle aches and pains please let me know  Continue other medicines   Follow up with me in about 2 months  Stop at check out for referral to neurology   If symptoms re occur-call 911

## 2015-03-01 NOTE — Telephone Encounter (Signed)
Pt's wife notified of Dr. Royden Purlower's instructions and verbalized understanding she will call back with med that is covered

## 2015-03-01 NOTE — Progress Notes (Signed)
Pre visit review using our clinic review tool, if applicable. No additional management support is needed unless otherwise documented below in the visit note. 

## 2015-03-01 NOTE — Telephone Encounter (Signed)
Patient's wife, Daniel [name] called.  Atorvastatin was prescribed at today's appointment.  Per pharmacist at [facility], this is not covered by the insurance.  She would like to discuss options (PA or a different medication).  Please advise.

## 2015-03-01 NOTE — Progress Notes (Signed)
Subjective:    Patient ID: Daniel [name], male    DOB: 1966/09/15, 49 y.o.   MRN: [id number]  HPI Here for f/u of hosp for CVA On 4/23 Developed a headache with driving-took ibuprofen  Speech became a little more difficult - after several days finally went to the ER   CT scan head neg  Elevated bp 176/96 - came down  A1C was 6.1 Cholesterol stable  ast 52 - drank beer the night before   MRI - 2 cm later precentral gyrus infarct  Old lacunar R basal ganglia   Chronic microvasc changes - age related   2D echo -was normal , normal function  Also a good carotid doppler   Did not want him to follow up with neuro  He was started on baby aspirin - no problems with that  Wife gave him a 325 asa the day he was admitted   Quit smoking 6 wk ago -is happy  He gained 10 lb  Went to a hypnotist   Still has a little diff with speech He declines speech therapy   Patient Active Problem List   Diagnosis Date Noted  . CVA (cerebral infarction) 03/01/2015  . Right knee pain 04/06/2014  . Benign paroxysmal positional vertigo 03/10/2014  . BPH (benign prostatic hyperplasia) 11/16/2013  . Routine general medical examination at a health care facility 11/12/2013  . Obesity 03/02/2011  . VITAMIN B12 DEFICIENCY 09/29/2007  . ERECTILE DYSFUNCTION 07/01/2007  . History of tobacco abuse 07/01/2007  . LOW BACK PAIN, CHRONIC 07/01/2007  . HYPERTENSION, BENIGN ESSENTIAL 06/03/2007   Past Medical History  Diagnosis Date  . Hypertension   . Ruptured disk    Past Surgical History  Procedure Laterality Date  . Back surgery      ruptured disk, L-S  . Cardiac catheterization  05/2004    minimal CAD   History  Substance Use Topics  . Smoking status: Former Smoker    Types: Cigarettes    Quit date: 01/18/2015  . Smokeless tobacco: Never Used  . Alcohol Use: 0.0 oz/week    0 Standard drinks or equivalent per week     Comment: occasional   Family History  Problem Relation Age of  Onset  . Hypertension Father   . Heart disease Father     MI   Allergies  Allergen Reactions  . Ace Inhibitors     REACTION: cough   Current Outpatient Prescriptions on File Prior to Visit  Medication Sig Dispense Refill  . hydrochlorothiazide (HYDRODIURIL) 25 MG tablet Take 0.5 tablets (12.5 mg total) by mouth daily. 30 tablet 11  . losartan (COZAAR) 100 MG tablet TAKE 1 TABLET (100 MG TOTAL) BY MOUTH DAILY. 30 tablet 2   No current facility-administered medications on file prior to visit.    Review of Systems Review of Systems  Constitutional: Negative for fever, appetite change, fatigue and unexpected weight change.  Eyes: Negative for pain and visual disturbance.  Respiratory: Negative for cough and shortness of breath.   Cardiovascular: Negative for cp or palpitations    Gastrointestinal: Negative for nausea, diarrhea and constipation.  Genitourinary: Negative for urgency and frequency.  Skin: Negative for pallor or rash   Neurological: Negative for weakness, light-headedness, numbness and headaches. pos for speech slowness Hematological: Negative for adenopathy. Does not bruise/bleed easily.  Psychiatric/Behavioral: Negative for dysphoric mood. The patient is not nervous/anxious.         Objective:   Physical Exam  Constitutional: He appears  well-developed and well-nourished. No distress.  obese and well appearing    HENT:  Head: Normocephalic and atraumatic.  Mouth/Throat: Oropharynx is clear and moist.  Eyes: Conjunctivae and EOM are normal. Pupils are equal, round, and reactive to light. Right eye exhibits no discharge. Left eye exhibits no discharge. No scleral icterus.  Neck: Normal range of motion. Neck supple. No JVD present. Carotid bruit is not present. No thyromegaly present.  Cardiovascular: Normal rate, regular rhythm, normal heart sounds and intact distal pulses.  Exam reveals no gallop.   Pulmonary/Chest: Effort normal and breath sounds normal. No  respiratory distress. He has no wheezes. He exhibits no tenderness.  Abdominal: Soft. Bowel sounds are normal. He exhibits no distension, no abdominal bruit and no mass. There is no tenderness.  Musculoskeletal: He exhibits no edema or tenderness.  Lymphadenopathy:    He has no cervical adenopathy.  Neurological: He is alert. He has normal strength and normal reflexes. He displays no atrophy. No cranial nerve deficit or sensory deficit. He exhibits normal muscle tone. He displays a negative Romberg sign. Coordination and gait normal.  Speech is very mildly slowed-almost un noticeable  No neuro deficits   Skin: Skin is warm and dry. No rash noted. No erythema. No pallor.  Psychiatric: He has a normal mood and affect.          Assessment & Plan:   Problem List Items Addressed This Visit      Cardiovascular and Mediastinum   HYPERTENSION, BENIGN ESSENTIAL    BP: 140/90 mmHg   Disc need for tighter control in light of recent cva  Add amlodipine 5 mg today -disc poss side eff F/u planned  Disc DASH eating plan and need for exercise       Relevant Medications   aspirin 81 MG tablet   amLODipine (NORVASC) 5 MG tablet   atorvastatin (LIPITOR) 10 MG tablet   sildenafil (VIAGRA) 50 MG tablet     Nervous and Auditory   CVA (cerebral infarction) - Primary    2 cm lateral precentral gyrus infarct seen on MRI with old lacunar infarcts seen as well  Symptoms almost totally resolved - very slight speech issue (he declines speech tx/ OT) On asa 81 mg and he tolerates this  Stressed importance of seeking care by EMS if symptoms return  Will begin atorvastatin to ach LDL of 70 or below Start 5 mg amlodipine for better bp control  Disc imp of wt loss and commended on smoking cessation   Ref to neuro made  No source of embolus found (2D echo and carotid doppler)      Relevant Orders   Ambulatory referral to Neurology     Other   History of tobacco abuse    Commended on smoking  cessation and enc to stay smoke free

## 2015-03-01 NOTE — Telephone Encounter (Signed)
Patient's wife called back stating she was able to get the medication covered.  No other action is needed.

## 2015-03-01 NOTE — Telephone Encounter (Signed)
Please have her call her insurance -ask what statins are covered and we will pick one  ? Pravastatin, simvastatin or other?

## 2015-03-03 NOTE — Assessment & Plan Note (Signed)
2 cm lateral precentral gyrus infarct seen on MRI with old lacunar infarcts seen as well  Symptoms almost totally resolved - very slight speech issue (he declines speech tx/ OT) On asa 81 mg and he tolerates this  Stressed importance of seeking care by EMS if symptoms return  Will begin atorvastatin to ach LDL of 70 or below Start 5 mg amlodipine for better bp control  Disc imp of wt loss and commended on smoking cessation   Ref to neuro made  No source of embolus found (2D echo and carotid doppler)

## 2015-03-03 NOTE — Assessment & Plan Note (Signed)
BP: 140/90 mmHg   Disc need for tighter control in light of recent cva  Add amlodipine 5 mg today -disc poss side eff F/u planned  Disc DASH eating plan and need for exercise

## 2015-03-03 NOTE — Assessment & Plan Note (Signed)
Commended on smoking cessation and enc to stay smoke free

## 2015-03-06 NOTE — H&P (Signed)
PATIENT NAME:  Daniel [name], Daniel [name] MR#:  [id number] DATE OF BIRTH:  August 30, 1966  DATE OF ADMISSION:  02/26/2015  PRIMARY CARE PHYSICIAN: Marne A. Tower, MD  REFERRING EMERGENCY ROOM PHYSICIAN: Su Ley, MD    CHIEF COMPLAINT: Dysarthria.    HISTORY OF PRESENT ILLNESS: The patient is a 49 year old male with a past medical history of hypertension, borderline diabetes mellitus, is brought into the ED with a chief complaint of slurry speech. The patient reported that his slurry speech started on Thursday afternoon, but the wife did not notice it until yesterday evening. He has a chronic history of headaches, takes ibuprofen on an as needed basis. His initial CAT scan of the head is negative. Headache is resolved at this time. Dysarthria was completely resolved in the ED, but wife was so concerned that his slurry speech was worse yesterday evening after he had couple of beers. Denies any upper or lower extremity weakness. No similar complaints in the past. He denies any blurry vision, black floaters, or unsteady gait. No difficulty in swallowing either.   PAST MEDICAL HISTORY: Hypertension, borderline diabetes mellitus, obesity, chronic low back pain.   PAST SURGICAL HISTORY: Back surgery.  ALLERGIES: No known drug allergies.   PSYCHOSOCIAL HISTORY: Lives at home with wife. He used to smoke, but quit smoking 6 weeks ago. Occasional intake of beer. Denies any illicit drug usage.   FAMILY HISTORY: Coronary artery disease runs in his family. Mother has history of coronary artery disease.   HOME MEDICATIONS: Viagra 50 mg 1 tablet p.o. once a day as needed, losartan dose unknown 1 tablet p.o. once daily, ibuprofen 200 mg 3 to 4 tablets 2 times a day as needed for headache.   REVIEW OF SYSTEMS: CONSTITUTIONAL: Denies any fever or fatigue.  EYES: Denies blurry vision or double vision.  EARS, NOSE, AND THROAT: Denies epistaxis or discharge. Complaining of dysarthria.  RESPIRATORY: Denies cough or  COPD.  CARDIOVASCULAR: No chest pain or palpitations.  GASTROINTESTINAL: Denies nausea, vomiting, diarrhea, or abdominal pain.  GENITOURINARY: No dysuria or hematuria.  ENDOCRINE: Denies polyuria or nocturia. He has borderline diabetes mellitus. HEMATOLOGIC AND LYMPHATIC: No anemia, easy bruising, or bleeding.  INTEGUMENTARY: No acne, rash, lesions.  MUSCULOSKELETAL: No joint pain in the neck, but has chronic low back pain.  NEUROLOGICAL: Denies any vertigo or ataxia, but he is concerned about dysarthria.  PSYCHIATRIC: No ADD or OCD.     PHYSICAL EXAMINATION:  VITAL SIGNS: Temperature 98.4, pulse 67, respirations 18, blood pressure 176/96, pulse oximetry is 94%.  GENERAL APPEARANCE: Not in acute distress. Moderately built and nourished, obese.  HEENT: Normocephalic, atraumatic. Pupils are equal and reactive to light and accommodation. No scleral icterus. No conjunctival injection. No sinus tenderness. No postnasal drip. Moist mucous membranes.  NECK: Supple. No JVD. No thyromegaly. Range of motion is intact.  LUNGS: Clear to auscultation bilaterally. No accessory muscle use and no anterior chest wall tenderness on palpation.  CARDIAC: S1, S2 normal. Regular rate and rhythm. No murmurs.  GASTROINTESTINAL: Soft, obese. Bowel sounds are present in all 4 quadrants. Nontender, nondistended. No masses felt.  NEUROLOGIC: Awake, alert, oriented x 3. Cranial nerves II through XII are grossly intact. Motor and sensory are intact. No dysarthria. No cerebellar signs. No pronator drift.  EXTREMITIES: No edema. No cyanosis. No clubbing.  SKIN: Warm to touch. Normal turgor. No rashes. No lesions.  MUSCULOSKELETAL: No joint effusion, tenderness, or edema.  PSYCHIATRIC: Normal mood and affect.   LABORATORY AND IMAGING STUDIES: CAT  scan of the head without contrast: No acute intracranial pathology, inflammatory changes in the ethmoid air cells noticed. LFTs are normal. CBC is normal except platelet count at  141,000. BMP: Sodium 134, glucose 172, anion gap 5. The rest of the BMP is normal.   A 12-lead EKG: Normal sinus rhythm with left axis deviation. No acute ST-T wave changes.   ASSESSMENT AND PLAN: A 49 year old Caucasian male brought into the Emergency Department with a chief complaint of dysarthria that started Thursday afternoon that got worse yesterday evening after drinking a couple of beers. Completely resolved at this point.  1.  Dysarthria, probably from transient ischemic attack versus cerebrovascular accident, resolved now: We will admit him to off-unit telemetry. Initial CAT scan of the head is negative. We will get complete stroke workup with MRI of the brain, carotid Dopplers, and 2-D echocardiogram. We will get neurological checks. Physical therapy evaluation. Bedside swallow evaluation with speech pathology. Also, speech pathology consult is placed regarding the dysarthria. We will provide him aspirin and statin. We will check fasting lipid panel.  2.  History of borderline diabetes mellitus: We will check hemoglobin A1c. The patient is not on any medication. We will provide him a diabetic diet.  3.  Hypertension. Blood pressure is elevated. This can be stress related. The patient takes losartan. We will resume that and up titrate as needed basis. We will allow permissive hypertension at this point as we are ruling him out for transient ischemic attack versus cerebrovascular accident.  4.  Chronic back pain:  He takes ibuprofen on an as-needed basis. No acute exacerbation at this time.  5.  We will provide gastrointestinal and deep vein thrombosis prophylaxis.   CODE STATUS: He is a FULL CODE. Wife is the medical power of attorney.   Plan of care discussed with the patient and his family members at bedside. They all verbalized understanding of the plan.   TOTAL TIME SPENT: 45 minutes.   ____________________________ Ramonita LabAruna , MD ag:TT D: 02/26/2015 20:25:28 ET T: 02/26/2015  21:11:30 ET JOB#: [id number]  cc: Ramonita LabAruna , MD, <Dictator> Marne A. Milinda Antisower, MD Ramonita LabARUNA  MD ELECTRONICALLY SIGNED 02/27/2015 13:43

## 2015-03-06 NOTE — Discharge Summary (Signed)
PATIENT NAME:  Daniel [name], Daniel [name] MR#:  [id number] DATE OF BIRTH:  20-Oct-1966  DATE OF ADMISSION:  02/26/2015 DATE OF DISCHARGE:  02/27/2015  ADMITTING DIAGNOSIS:  Dysarthria.   DISCHARGE DIAGNOSES:  1. 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  2. Accelerated hypertension, present on admission.  3. Borderline diabetes.  4. Morbid obesity.  5. Chronic low back pain.  6. Decreased HDL.  7. History of nicotine addiction.  8. Status post back surgery in the past.   CONSULTANTS:  None.   PERTINENT LABORATORIES AND EVALUATIONS:  CT scan of the head without contrast showed no acute intracranial pathology.  Inflammatory changes in the ethmoid air cells.  LFTs are normal.  CBC normal.  BMP:  Sodium 134, glucose 172.  The rest of the BMP was normal.  EKG showed normal sinus rhythm with left axis deviation.  No acute ST-T wave changes.  MRI of the brain showed an acute nonhemorrhagic 2 cm infarct involving the lateral aspect of the precentral gyrus.  Echocardiogram of the heart showed a normal EF, normal global left ventricular function.  Cholesterol 150, triglycerides 98.  HDL was 34.  LDL was 96.  WBC 6.7, hemoglobin 15.1.  Platelet count was 123,000.  Glucose 132, BUN 16, creatinine 1.04, sodium 136.  Carotid Doppler showed a small amount of calcified plaque at the right carotid bulb and the proximal internal carotid artery and minimal calcified plaque at the left carotid bulb and proximal internal carotid artery.  No evidence of carotid stenosis.   HOSPITAL COURSE:  Please refer to H and P done by the admitting physician.  The patient is a 49 year old white male with medical history of hypertension, borderline diabetes, presented with slurred speech.  Started a few days prior.  The patient came to the ED with these symptoms. Initially, he was thought to have a TIA.  CT scan of the head was negative.  He was admitted under observation for further  evaluation.  The patient underwent an MRI of the brain which confirmed findings of an acute CVA.  He also had an echocardiogram and carotid Dopplers which were nonrevealing.  The patient's speech is improved.  He had no other deficits.  At this time, he is doing much better and is stable for discharge.   DISCHARGE MEDICATIONS:  Viagra 50 one tab p.o. q. daily as needed, ibuprofen 200 mg 1-2 tabs b.i.d. as needed for headache, Cozaar 100 q. daily, aspirin 81 one 1 tab p.o. q. daily.   DIET:  Low sodium, low fat, low cholesterol.   ACTIVITY:  As tolerated.   FOLLOWUP:  With primary MD in 1-2 weeks.     TIME SPENT ON THIS PATIENT:  Thirty-five minutes.     ____________________________ Lacie Scotts H. Allena KatzPatel, MD shp:kc D: 02/28/2015 12:39:33 ET T: 02/28/2015 16:10:13 ET JOB#: [id number]  cc:  H. Allena KatzPatel, MD, <Dictator> Charise CarwinSHREYANG H  MD ELECTRONICALLY SIGNED 03/04/2015 14:41

## 2015-03-08 ENCOUNTER — Other Ambulatory Visit: Payer: Self-pay | Admitting: Family Medicine

## 2015-03-10 ENCOUNTER — Encounter: Payer: Self-pay | Admitting: Family Medicine

## 2015-03-11 ENCOUNTER — Other Ambulatory Visit: Payer: Self-pay | Admitting: Family Medicine

## 2015-04-05 ENCOUNTER — Ambulatory Visit (INDEPENDENT_AMBULATORY_CARE_PROVIDER_SITE_OTHER): Payer: PRIVATE HEALTH INSURANCE | Admitting: Neurology

## 2015-04-05 ENCOUNTER — Encounter: Payer: Self-pay | Admitting: *Deleted

## 2015-04-05 ENCOUNTER — Encounter: Payer: Self-pay | Admitting: Neurology

## 2015-04-05 VITALS — BP 148/70 | HR 68 | Resp 16 | Ht 69.0 in | Wt 277.8 lb

## 2015-04-05 DIAGNOSIS — Z87891 Personal history of nicotine dependence: Secondary | ICD-10-CM

## 2015-04-05 DIAGNOSIS — I10 Essential (primary) hypertension: Secondary | ICD-10-CM | POA: Diagnosis not present

## 2015-04-05 DIAGNOSIS — I6339 Cerebral infarction due to thrombosis of other cerebral artery: Secondary | ICD-10-CM | POA: Diagnosis not present

## 2015-04-05 NOTE — Progress Notes (Signed)
NEUROLOGY CONSULTATION NOTE  Daniel [name] MRN: [id number] DOB: 03-09-1966  Referring provider: Dr. Milinda Antis Primary care provider: Dr Milinda Antis  Reason for consult:  stroke  HISTORY OF PRESENT ILLNESS: Daniel [name] is a 49 year old right-handed man with hypertension, borderline diabetes and morbid obesity who presents for stroke.  Records, labs, echo report and report of MRI and CT of head reviewed.  He was admitted to [facility] on [facility]/26/15 for 2 days of slurred speech.  CT of head was unremarkable.  Follow up MRI of brain showed acute infarct in the right precentral gyrus.  Chronic small vessel disease and remote lacunar infarcts in the right basal ganglia were also seen.  He underwent a stroke workup.  EKG showed normal sinus rhythm.  2D echo showed EF of 55-60% with normal global left ventricular function.  Carotid doppler showed minimal calcified plaque at both carotid bulbs and proximal ICA, but no hemodynamically significant stenosis.  Cholesterol was 150, TG 89, HDL 34 and LDL 96.  Hgb A1c was 6.1.  He was discharged on ASA  daily.  He is on Lipitor. He quit smoking in March.  He has been doing well.  He has since improved his diet and started exercising.  PAST MEDICAL HISTORY: Past Medical History  Diagnosis Date  . Hypertension   . Ruptured disk     PAST SURGICAL HISTORY: Past Surgical History  Procedure Laterality Date  . Back surgery      ruptured disk, L-S  . Cardiac catheterization  05/2004    minimal CAD    MEDICATIONS: Current Outpatient Prescriptions on File Prior to Visit  Medication Sig Dispense Refill  . amLODipine (NORVASC) 5 MG tablet Take 1 tablet (5 mg total) by mouth daily. 30 tablet 3  . aspirin 81 MG tablet Take 81 mg by mouth daily.    Marland Kitchen atorvastatin (LIPITOR) 10 MG tablet Take 1 tablet (10 mg total) by mouth daily. 30 tablet 3  . hydrochlorothiazide (HYDRODIURIL) 25 MG tablet Take 0.5 tablets (12.5 mg total) by mouth daily. 30  tablet 11  . losartan (COZAAR) 100 MG tablet TAKE 1 TABLET (100 MG TOTAL) BY MOUTH DAILY. 30 tablet 2  . sildenafil (VIAGRA) 50 MG tablet Take 1 tablet (50 mg total) by mouth daily as needed. Take one by mouth once daily as directed as needed 30 minutes before sexual activity. 10 tablet 11   No current facility-administered medications on file prior to visit.    ALLERGIES: Allergies  Allergen Reactions  . Ace Inhibitors     REACTION: cough    FAMILY HISTORY: Family History  Problem Relation Age of Onset  . Hypertension Father   . Heart disease Father     MI  . Cancer Maternal Grandmother     lung  . Heart failure Paternal Grandfather     SOCIAL HISTORY: History   Social History  . Marital Status: Married    Spouse Name: N/A  . Number of Children: 1  . Years of Education: N/A   Occupational History  . Haematologist    Social History Main Topics  . Smoking status: Former Smoker    Types: Cigarettes    Quit date: 01/18/2015  . Smokeless tobacco: Never Used  . Alcohol Use: 0.0 oz/week    0 Standard drinks or equivalent per week     Comment: occasional  . Drug Use: No  . Sexual Activity:    Partners: Female   Other Topics Concern  .  Not on file   Social History Narrative    REVIEW OF SYSTEMS: Constitutional: No fevers, chills, or sweats, no generalized fatigue, change in appetite Eyes: No visual changes, double vision, eye pain Ear, nose and throat: No hearing loss, ear pain, nasal congestion, sore throat Cardiovascular: No chest pain, palpitations Respiratory:  No shortness of breath at rest or with exertion, wheezes GastrointestinaI: No nausea, vomiting, diarrhea, abdominal pain, fecal incontinence Genitourinary:  No dysuria, urinary retention or frequency Musculoskeletal:  No neck pain, back pain Integumentary: No rash, pruritus, skin lesions Neurological: as above Psychiatric: No depression, insomnia, anxiety Endocrine: No palpitations, fatigue,  diaphoresis, mood swings, change in appetite, change in weight, increased thirst Hematologic/Lymphatic:  No anemia, purpura, petechiae. Allergic/Immunologic: no itchy/runny eyes, nasal congestion, recent allergic reactions, rashes  PHYSICAL EXAM: Filed Vitals:   04/05/15 1436  BP: 148/70  Pulse: 68  Resp: 16   General: No acute distress Head:  Normocephalic/atraumatic Eyes:  fundi unremarkable, without vessel changes, exudates, hemorrhages or papilledema. Neck: supple, no paraspinal tenderness, full range of motion Back: No paraspinal tenderness Heart: regular rate and rhythm Lungs: Clear to auscultation bilaterally. Vascular: No carotid bruits. Neurological Exam: Mental status: alert and oriented to person, place, and time, recent and remote memory intact, fund of knowledge intact, attention and concentration intact, speech fluent and not dysarthric, language intact. Cranial nerves: CN I: not tested CN II: pupils equal, round and reactive to light, visual fields intact, fundi unremarkable, without vessel changes, exudates, hemorrhages or papilledema. CN III, IV, VI:  full range of motion, no nystagmus, no ptosis CN V: facial sensation intact CN VII: upper and lower face symmetric CN VIII: hearing intact CN IX, X: gag intact, uvula midline CN XI: sternocleidomastoid and trapezius muscles intact CN XII: tongue midline Bulk & Tone: normal, no fasciculations. Motor:  5/5 throughout Sensation:  Temperature and vibration intact Deep Tendon Reflexes:  2+ throughout, toes downgoing Finger to nose testing:  No dysmetria Heel to shin:  No dysmetria Gait:  Normal station and stride.  Able to turn and walk in tandem. Romberg negative.  IMPRESSION: Right frontal infarct, likely secondary to small vessel disease Hypertension Morbid obesity History of tobacco abuse  PLAN: ASA 81mg  daily Lipitor (LDL less than 100). Continue exercise and diet (weight loss) Optimize blood pressure  control Follow up in 3 months.  Thank you for allowing me to take part in the care of this patient.  Shon MilletAdam Jaffe, DO  CC:  Roxy MannsMarne Tower, MD

## 2015-04-05 NOTE — Patient Instructions (Signed)
Continue aspirin 81mg  daily Continue Lipitor Continue exercise and diet Try to improve blood pressure Follow up in 3 months.

## 2015-07-14 ENCOUNTER — Other Ambulatory Visit: Payer: Self-pay | Admitting: Family Medicine

## 2015-07-22 ENCOUNTER — Ambulatory Visit: Payer: PRIVATE HEALTH INSURANCE | Admitting: Neurology

## 2015-07-28 ENCOUNTER — Other Ambulatory Visit: Payer: Self-pay | Admitting: Family Medicine

## 2015-12-09 ENCOUNTER — Ambulatory Visit: Payer: PRIVATE HEALTH INSURANCE | Admitting: Family Medicine

## 2016-02-10 ENCOUNTER — Ambulatory Visit (INDEPENDENT_AMBULATORY_CARE_PROVIDER_SITE_OTHER): Payer: PRIVATE HEALTH INSURANCE | Admitting: Family Medicine

## 2016-02-10 ENCOUNTER — Encounter: Payer: Self-pay | Admitting: Family Medicine

## 2016-02-10 VITALS — BP 166/94 | HR 70 | Temp 97.6°F | Ht 69.0 in | Wt 292.8 lb

## 2016-02-10 DIAGNOSIS — K5900 Constipation, unspecified: Secondary | ICD-10-CM | POA: Diagnosis not present

## 2016-02-10 DIAGNOSIS — Z87891 Personal history of nicotine dependence: Secondary | ICD-10-CM

## 2016-02-10 DIAGNOSIS — I10 Essential (primary) hypertension: Secondary | ICD-10-CM

## 2016-02-10 DIAGNOSIS — Z125 Encounter for screening for malignant neoplasm of prostate: Secondary | ICD-10-CM

## 2016-02-10 DIAGNOSIS — I6339 Cerebral infarction due to thrombosis of other cerebral artery: Secondary | ICD-10-CM | POA: Diagnosis not present

## 2016-02-10 MED ORDER — ATORVASTATIN CALCIUM 10 MG PO TABS: ORAL_TABLET | ORAL | Status: DC

## 2016-02-10 MED ORDER — SILDENAFIL CITRATE 50 MG PO TABS: 50.0000 mg | ORAL_TABLET | Freq: Every day | ORAL | Status: DC | PRN

## 2016-02-10 MED ORDER — LOSARTAN POTASSIUM 100 MG PO TABS: ORAL_TABLET | ORAL | Status: DC

## 2016-02-10 MED ORDER — AMLODIPINE BESYLATE 5 MG PO TABS: ORAL_TABLET | ORAL | Status: DC

## 2016-02-10 MED ORDER — HYDROCHLOROTHIAZIDE 25 MG PO TABS: 12.5000 mg | ORAL_TABLET | Freq: Every day | ORAL | Status: DC

## 2016-02-10 NOTE — Assessment & Plan Note (Signed)
bp is up today off of his medications  No cp or palpitations or headaches or edema  No side effects to medicines in the past-just ran out  BP Readings from Last 3 Encounters:  02/10/16 166/94  04/05/15 148/70  03/01/15 140/90     Will re start them (sent in) Then fu 1 mo for fasting lab Then f/u for a visit  Wt loss enc

## 2016-02-10 NOTE — Assessment & Plan Note (Signed)
1 year cig free  Commended! Commended!!!

## 2016-02-10 NOTE — Patient Instructions (Signed)
Great job with quitting smoking and staying quit ! Work on weight loss- cut servings by 1/3  Also get on the treadmill 5 days per week Drink lots of water (get rid of sodas) For constipation -try miralax over the counter-mix with water and take daily until you have regular more loose bowel movements -then decide how often you need it  Start back on your medicines Schedule fasting labs in about a month  Follow up with me in about 6 weeks - take your medicines so we know that blood pressure is well controlled

## 2016-02-10 NOTE — Progress Notes (Signed)
Subjective:    Patient ID: Daniel [name], male    DOB: 08-31-1966, 50 y.o.   MRN: [id number]  HPI Here for f/u of chronic health problems   Wt is up 15 lb with bmi of 43   Some constipation   Wants to start exercise  Has a treadmill -not using it but ready to start  Likes to jog a little as well   Needs to eat less  Not a lot of junk food  Plans to cut portions    Neuro could not find a source of his stroke  Small vessel   bp is up today-has not taken medicine in about a week  No cp or palpitations or headaches or edema  No side effects to medicines  BP Readings from Last 3 Encounters:  02/10/16 166/94  04/05/15 148/70  03/01/15 140/90     Needs labs   Otherwise feeling ok   Off lipitor also  Eating low fat   Patient Active Problem List   Diagnosis Date Noted  . Constipation 02/10/2016  . CVA (cerebral infarction) 03/01/2015  . Right knee pain 04/06/2014  . Benign paroxysmal positional vertigo 03/10/2014  . BPH (benign prostatic hyperplasia) 11/16/2013  . Routine general medical examination at a health care facility 11/12/2013  . Morbid obesity (HCC) 03/02/2011  . VITAMIN B12 DEFICIENCY 09/29/2007  . ERECTILE DYSFUNCTION 07/01/2007  . History of tobacco abuse 07/01/2007  . LOW BACK PAIN, CHRONIC 07/01/2007  . HYPERTENSION, BENIGN ESSENTIAL 06/03/2007   Past Medical History  Diagnosis Date  . Hypertension   . Ruptured disk    Past Surgical History  Procedure Laterality Date  . Back surgery      ruptured disk, L-S  . Cardiac catheterization  05/2004    minimal CAD   Social History  Substance Use Topics  . Smoking status: Former Smoker    Types: Cigarettes    Quit date: 01/18/2015  . Smokeless tobacco: Never Used  . Alcohol Use: 0.0 oz/week    0 Standard drinks or equivalent per week     Comment: occasional   Family History  Problem Relation Age of Onset  . Hypertension Father   . Heart disease Father     MI  . Cancer Maternal  Grandmother     lung  . Heart failure Paternal Grandfather    Allergies  Allergen Reactions  . Ace Inhibitors     REACTION: cough   Current Outpatient Prescriptions on File Prior to Visit  Medication Sig Dispense Refill  . aspirin 81 MG tablet Take 81 mg by mouth daily.     No current facility-administered medications on file prior to visit.    Review of Systems  Review of Systems  Constitutional: Negative for fever, appetite change, fatigue and unexpected weight change.  Eyes: Negative for pain and visual disturbance.  Respiratory: Negative for cough and shortness of breath.   Cardiovascular: Negative for cp or palpitations    Gastrointestinal: Negative for nausea, diarrhea and constipation.  Genitourinary: Negative for urgency and frequency.  Skin: Negative for pallor or rash   Neurological: Negative for weakness, light-headedness, numbness and headaches.  Hematological: Negative for adenopathy. Does not bruise/bleed easily.  Psychiatric/Behavioral: Negative for dysphoric mood. The patient is not nervous/anxious.         Objective:   Physical Exam  Constitutional: Daniel [name] appears well-developed and well-nourished. No distress.  Morbidly obese and well appearing  HENT:  Head: Normocephalic and atraumatic.  Mouth/Throat: Oropharynx is clear  and moist.  Eyes: Conjunctivae and EOM are normal. Pupils are equal, round, and reactive to light.  Neck: Normal range of motion. Neck supple. No JVD present. Carotid bruit is not present. No thyromegaly present.  Cardiovascular: Normal rate, regular rhythm, normal heart sounds and intact distal pulses.  Exam reveals no gallop.   Pulmonary/Chest: Effort normal and breath sounds normal. No respiratory distress. Daniel [name] has no wheezes. Daniel [name] has no rales.  No crackles Diffusely distant bs Overall good air exch with no wheezing  Abdominal: Soft. Bowel sounds are normal. Daniel [name] exhibits no distension, no abdominal bruit and no mass. There is no tenderness.    Musculoskeletal: Daniel [name] exhibits no edema or tenderness.  Lymphadenopathy:    Daniel [name] has no cervical adenopathy.  Neurological: Daniel [name] is alert. Daniel [name] has normal reflexes.  Skin: Skin is warm and dry. No rash noted.  Psychiatric: Daniel [name] has a normal mood and affect.  pleasant          Assessment & Plan:   Problem List Items Addressed This Visit      Cardiovascular and Mediastinum   HYPERTENSION, BENIGN ESSENTIAL - Primary    bp is up today off of his medications  No cp or palpitations or headaches or edema  No side effects to medicines in the past-just ran out  BP Readings from Last 3 Encounters:  02/10/16 166/94  04/05/15 148/70  03/01/15 140/90     Will re start them (sent in) Then fu 1 mo for fasting lab Then f/u for a visit  Wt loss enc      Relevant Medications   sildenafil (VIAGRA) 50 MG tablet   losartan (COZAAR) 100 MG tablet   hydrochlorothiazide (HYDRODIURIL) 25 MG tablet   amLODipine (NORVASC) 5 MG tablet   atorvastatin (LIPITOR) 10 MG tablet     Digestive   Constipation    Recommended miralax one serving as directed daily until regular /looser bms -then take as needed  Inc water and fiber        Nervous and Auditory   CVA (cerebral infarction)    Has had neuro f/u  Small vessel  No further episodes Continues asa 81 mg Getting back on lipid and bp meds-disc goals for both Enc wt loss and exercise  Lab 1 mo f/u 6-8 wk        Other   History of tobacco abuse    1 year cig free  Commended! Commended!!!      Morbid obesity (HCC)    Discussed how this problem influences overall health and the risks it imposes  Reviewed plan for weight loss with lower calorie diet (via better food choices and also portion control or program like weight watchers) and exercise building up to or more than 30 minutes 5 days per week including some aerobic activity    Plans to cut portions (usually chooses healthy meals) by 1/3  Also cut out sodas (this is a big source of  calories) Add 5 days of exercise (treadmill) a week and then yard work/etc on other days F/u 6-8 wk

## 2016-02-10 NOTE — Assessment & Plan Note (Signed)
Has had neuro f/u  Small vessel  No further episodes Continues asa 81 mg Getting back on lipid and bp meds-disc goals for both Enc wt loss and exercise  Lab 1 mo f/u 6-8 wk

## 2016-02-10 NOTE — Assessment & Plan Note (Signed)
Discussed how this problem influences overall health and the risks it imposes  Reviewed plan for weight loss with lower calorie diet (via better food choices and also portion control or program like weight watchers) and exercise building up to or more than 30 minutes 5 days per week including some aerobic activity    Plans to cut portions (usually chooses healthy meals) by 1/3  Also cut out sodas (this is a big source of calories) Add 5 days of exercise (treadmill) a week and then yard work/etc on other days F/u 6-8 wk

## 2016-02-10 NOTE — Assessment & Plan Note (Signed)
Recommended miralax one serving as directed daily until regular /looser bms -then take as needed  Inc water and fiber

## 2016-02-10 NOTE — Progress Notes (Signed)
Pre visit review using our clinic review tool, if applicable. No additional management support is needed unless otherwise documented below in the visit note. 

## 2016-03-05 DIAGNOSIS — I252 Old myocardial infarction: Secondary | ICD-10-CM

## 2016-03-05 HISTORY — DX: Old myocardial infarction: I25.2

## 2016-03-10 ENCOUNTER — Encounter (HOSPITAL_COMMUNITY)
Admission: EM | Disposition: A | Discharge status: Home / Self Care | Payer: Self-pay | Source: Home / Self Care | Attending: Cardiology

## 2016-03-10 ENCOUNTER — Encounter (HOSPITAL_COMMUNITY): Payer: Self-pay | Admitting: *Deleted

## 2016-03-10 ENCOUNTER — Inpatient Hospital Stay (HOSPITAL_COMMUNITY)
Admission: EM | Admit: 2016-03-10 | Discharge: 2016-03-12 | DRG: 247 | Disposition: A | Discharge status: Home / Self Care | Payer: PRIVATE HEALTH INSURANCE | Attending: Cardiology | Admitting: Cardiology

## 2016-03-10 ENCOUNTER — Emergency Department (HOSPITAL_COMMUNITY): Payer: PRIVATE HEALTH INSURANCE

## 2016-03-10 DIAGNOSIS — E785 Hyperlipidemia, unspecified: Secondary | ICD-10-CM | POA: Diagnosis present

## 2016-03-10 DIAGNOSIS — I214 Non-ST elevation (NSTEMI) myocardial infarction: Secondary | ICD-10-CM | POA: Diagnosis present

## 2016-03-10 DIAGNOSIS — Z955 Presence of coronary angioplasty implant and graft: Secondary | ICD-10-CM

## 2016-03-10 DIAGNOSIS — I2510 Atherosclerotic heart disease of native coronary artery without angina pectoris: Secondary | ICD-10-CM | POA: Diagnosis not present

## 2016-03-10 DIAGNOSIS — Z79899 Other long term (current) drug therapy: Secondary | ICD-10-CM

## 2016-03-10 DIAGNOSIS — E669 Obesity, unspecified: Secondary | ICD-10-CM | POA: Diagnosis present

## 2016-03-10 DIAGNOSIS — Z8673 Personal history of transient ischemic attack (TIA), and cerebral infarction without residual deficits: Secondary | ICD-10-CM

## 2016-03-10 DIAGNOSIS — Z7982 Long term (current) use of aspirin: Secondary | ICD-10-CM

## 2016-03-10 DIAGNOSIS — R918 Other nonspecific abnormal finding of lung field: Secondary | ICD-10-CM

## 2016-03-10 DIAGNOSIS — R079 Chest pain, unspecified: Secondary | ICD-10-CM | POA: Diagnosis present

## 2016-03-10 DIAGNOSIS — Z6839 Body mass index (BMI) 39.0-39.9, adult: Secondary | ICD-10-CM

## 2016-03-10 DIAGNOSIS — E1169 Type 2 diabetes mellitus with other specified complication: Secondary | ICD-10-CM

## 2016-03-10 DIAGNOSIS — Z87891 Personal history of nicotine dependence: Secondary | ICD-10-CM

## 2016-03-10 DIAGNOSIS — R9389 Abnormal findings on diagnostic imaging of other specified body structures: Secondary | ICD-10-CM

## 2016-03-10 DIAGNOSIS — I10 Essential (primary) hypertension: Secondary | ICD-10-CM | POA: Diagnosis present

## 2016-03-10 HISTORY — DX: Cerebral infarction, unspecified: I63.9

## 2016-03-10 HISTORY — DX: Low back pain, unspecified: M54.50

## 2016-03-10 HISTORY — DX: Low back pain: M54.5

## 2016-03-10 HISTORY — DX: Other chronic pain: G89.29

## 2016-03-10 HISTORY — DX: Alcohol abuse, uncomplicated: F10.10

## 2016-03-10 HISTORY — PX: CARDIAC CATHETERIZATION: SHX172

## 2016-03-10 HISTORY — DX: Obesity, unspecified: E66.9

## 2016-03-10 HISTORY — DX: Personal history of nicotine dependence: Z87.891

## 2016-03-10 HISTORY — DX: Atherosclerotic heart disease of native coronary artery without angina pectoris: I25.10

## 2016-03-10 LAB — CBC
HCT: 43.5 % (ref 39.0–52.0)
Hemoglobin: 14.9 g/dL (ref 13.0–17.0)
MCH: 31.6 pg (ref 26.0–34.0)
MCHC: 34.3 g/dL (ref 30.0–36.0)
MCV: 92.4 fL (ref 78.0–100.0)
PLATELETS: 135 10*3/uL — AB (ref 150–400)
RBC: 4.71 MIL/uL (ref 4.22–5.81)
RDW: 11.7 % (ref 11.5–15.5)
WBC: 6.6 10*3/uL (ref 4.0–10.5)

## 2016-03-10 LAB — TROPONIN I
TROPONIN I: 9 ng/mL — AB (ref <0.031)
Troponin I: 0.85 ng/mL (ref <0.031)

## 2016-03-10 LAB — BASIC METABOLIC PANEL
Anion gap: 11 (ref 5–15)
BUN: 19 mg/dL (ref 6–20)
CALCIUM: 9.2 mg/dL (ref 8.9–10.3)
CO2: 23 mmol/L (ref 22–32)
CREATININE: 0.97 mg/dL (ref 0.61–1.24)
Chloride: 102 mmol/L (ref 101–111)
GFR calc Af Amer: >60 mL/min (ref >60)
Glucose, Bld: 200 mg/dL — ABNORMAL HIGH (ref 65–99)
POTASSIUM: 4.3 mmol/L (ref 3.5–5.1)
Sodium: 136 mmol/L (ref 135–145)

## 2016-03-10 LAB — PROTIME-INR
INR: 1.04 (ref 0.00–1.49)
Prothrombin Time: 13.8 seconds (ref 11.6–15.2)

## 2016-03-10 LAB — I-STAT TROPONIN, ED: TROPONIN I, POC: 0.51 ng/mL — AB (ref 0.00–0.08)

## 2016-03-10 LAB — TSH: TSH: 1.521 u[IU]/mL (ref 0.350–4.500)

## 2016-03-10 LAB — APTT: aPTT: 31 seconds (ref 24–37)

## 2016-03-10 SURGERY — LEFT HEART CATH AND CORONARY ANGIOGRAPHY
Anesthesia: LOCAL

## 2016-03-10 MED ORDER — ATORVASTATIN CALCIUM 80 MG PO TABS
80.0000 mg | ORAL_TABLET | Freq: Every day | ORAL | Status: DC
  Administered 2016-03-10 – 2016-03-11 (×2): 80 mg via ORAL
  Filled 2016-03-10 (×2): qty 1

## 2016-03-10 MED ORDER — OXYCODONE-ACETAMINOPHEN 5-325 MG PO TABS: 1.0000 | ORAL_TABLET | ORAL | Status: DC | PRN

## 2016-03-10 MED ORDER — ONDANSETRON 4 MG PO TBDP
4.0000 mg | ORAL_TABLET | Freq: Once | ORAL | Status: AC | PRN
  Administered 2016-03-10: 4 mg via ORAL

## 2016-03-10 MED ORDER — SODIUM CHLORIDE 0.9 % IV SOLN: 250.0000 mL | INTRAVENOUS | Status: DC | PRN

## 2016-03-10 MED ORDER — HEPARIN BOLUS VIA INFUSION
4000.0000 [IU] | Freq: Once | INTRAVENOUS | Status: AC
  Administered 2016-03-10: 4000 [IU] via INTRAVENOUS
  Filled 2016-03-10: qty 4000

## 2016-03-10 MED ORDER — LOSARTAN POTASSIUM 50 MG PO TABS
100.0000 mg | ORAL_TABLET | Freq: Every day | ORAL | Status: DC
  Administered 2016-03-10 – 2016-03-12 (×3): 100 mg via ORAL
  Filled 2016-03-10 (×3): qty 2

## 2016-03-10 MED ORDER — TICAGRELOR 90 MG PO TABS
ORAL_TABLET | ORAL | Status: DC | PRN
  Administered 2016-03-10: 180 mg via ORAL

## 2016-03-10 MED ORDER — TIROFIBAN HCL IN NACL 5-0.9 MG/100ML-% IV SOLN
INTRAVENOUS | Status: AC
  Filled 2016-03-10: qty 100

## 2016-03-10 MED ORDER — METOPROLOL TARTRATE 12.5 MG HALF TABLET
12.5000 mg | ORAL_TABLET | Freq: Two times a day (BID) | ORAL | Status: DC
  Administered 2016-03-10 – 2016-03-11 (×3): 12.5 mg via ORAL
  Filled 2016-03-10 (×3): qty 1

## 2016-03-10 MED ORDER — SODIUM CHLORIDE 0.9% FLUSH: 3.0000 mL | INTRAVENOUS | Status: DC | PRN

## 2016-03-10 MED ORDER — BIVALIRUDIN BOLUS VIA INFUSION - CUPID
INTRAVENOUS | Status: DC | PRN
  Administered 2016-03-10: 97.275 mg via INTRAVENOUS

## 2016-03-10 MED ORDER — HEPARIN (PORCINE) IN NACL 2-0.9 UNIT/ML-% IJ SOLN
INTRAMUSCULAR | Status: DC | PRN
  Administered 2016-03-10: 1500 mL

## 2016-03-10 MED ORDER — MIDAZOLAM HCL 2 MG/2ML IJ SOLN
INTRAMUSCULAR | Status: AC
  Filled 2016-03-10: qty 2

## 2016-03-10 MED ORDER — TIROFIBAN HCL IN NACL 5-0.9 MG/100ML-% IV SOLN
INTRAVENOUS | Status: DC | PRN
  Administered 2016-03-10 (×2): 0.15 ug/kg/min via INTRAVENOUS

## 2016-03-10 MED ORDER — ASPIRIN EC 81 MG PO TBEC
81.0000 mg | DELAYED_RELEASE_TABLET | Freq: Every day | ORAL | Status: DC
  Administered 2016-03-11 – 2016-03-12 (×2): 81 mg via ORAL
  Filled 2016-03-10 (×2): qty 1

## 2016-03-10 MED ORDER — NITROGLYCERIN 1 MG/10 ML FOR IR/CATH LAB
INTRA_ARTERIAL | Status: DC | PRN
  Administered 2016-03-10: 200 ug via INTRACORONARY

## 2016-03-10 MED ORDER — FAMOTIDINE 20 MG PO TABS
20.0000 mg | ORAL_TABLET | Freq: Every day | ORAL | Status: DC
  Administered 2016-03-11 – 2016-03-12 (×2): 20 mg via ORAL
  Filled 2016-03-10 (×2): qty 1

## 2016-03-10 MED ORDER — FENTANYL CITRATE (PF) 100 MCG/2ML IJ SOLN
INTRAMUSCULAR | Status: DC | PRN
  Administered 2016-03-10: 25 ug via INTRAVENOUS

## 2016-03-10 MED ORDER — TIROFIBAN (AGGRASTAT) BOLUS VIA INFUSION
INTRAVENOUS | Status: DC | PRN
  Administered 2016-03-10: 3242.5 ug via INTRAVENOUS

## 2016-03-10 MED ORDER — BIVALIRUDIN 250 MG IV SOLR
250.0000 mg | INTRAVENOUS | Status: DC | PRN
  Administered 2016-03-10: 1.75 mg/kg/h via INTRAVENOUS

## 2016-03-10 MED ORDER — ADENOSINE (DIAGNOSTIC) FOR INTRACORONARY USE
INTRAVENOUS | Status: DC | PRN
  Administered 2016-03-10 (×2): 36 ug via INTRACORONARY

## 2016-03-10 MED ORDER — SODIUM CHLORIDE 0.9% FLUSH
3.0000 mL | Freq: Two times a day (BID) | INTRAVENOUS | Status: DC
  Administered 2016-03-11: 3 mL via INTRAVENOUS

## 2016-03-10 MED ORDER — TICAGRELOR 90 MG PO TABS
ORAL_TABLET | ORAL | Status: AC
  Filled 2016-03-10: qty 2

## 2016-03-10 MED ORDER — SODIUM CHLORIDE 0.9 % IV SOLN: INTRAVENOUS | Status: AC

## 2016-03-10 MED ORDER — NITROGLYCERIN 0.4 MG SL SUBL: 0.4000 mg | SUBLINGUAL_TABLET | SUBLINGUAL | Status: DC | PRN

## 2016-03-10 MED ORDER — ASPIRIN 81 MG PO CHEW
324.0000 mg | CHEWABLE_TABLET | Freq: Once | ORAL | Status: AC
  Administered 2016-03-10: 162 mg via ORAL
  Filled 2016-03-10: qty 4

## 2016-03-10 MED ORDER — BIVALIRUDIN 250 MG IV SOLR
INTRAVENOUS | Status: AC
  Filled 2016-03-10: qty 250

## 2016-03-10 MED ORDER — SODIUM CHLORIDE 0.9% FLUSH
3.0000 mL | Freq: Two times a day (BID) | INTRAVENOUS | Status: DC
Start: 2016-03-10 — End: 2016-03-12
  Administered 2016-03-11: 3 mL via INTRAVENOUS

## 2016-03-10 MED ORDER — SODIUM CHLORIDE 0.9% FLUSH: 3.0000 mL | Freq: Two times a day (BID) | INTRAVENOUS | Status: DC

## 2016-03-10 MED ORDER — ONDANSETRON 4 MG PO TBDP
ORAL_TABLET | ORAL | Status: AC
  Filled 2016-03-10: qty 1

## 2016-03-10 MED ORDER — MORPHINE SULFATE (PF) 2 MG/ML IV SOLN: 2.0000 mg | INTRAVENOUS | Status: DC | PRN

## 2016-03-10 MED ORDER — HEPARIN (PORCINE) IN NACL 2-0.9 UNIT/ML-% IJ SOLN
INTRAMUSCULAR | Status: AC
  Filled 2016-03-10: qty 1500

## 2016-03-10 MED ORDER — TICAGRELOR 90 MG PO TABS
90.0000 mg | ORAL_TABLET | Freq: Two times a day (BID) | ORAL | Status: DC
  Administered 2016-03-11 – 2016-03-12 (×3): 90 mg via ORAL
  Filled 2016-03-10 (×3): qty 1

## 2016-03-10 MED ORDER — ADENOSINE 6 MG/2ML IV SOLN
INTRAVENOUS | Status: AC
  Filled 2016-03-10: qty 2

## 2016-03-10 MED ORDER — VERAPAMIL HCL 2.5 MG/ML IV SOLN
INTRAVENOUS | Status: AC
  Filled 2016-03-10: qty 2

## 2016-03-10 MED ORDER — ONDANSETRON HCL 4 MG/2ML IJ SOLN: 4.0000 mg | Freq: Four times a day (QID) | INTRAMUSCULAR | Status: DC | PRN

## 2016-03-10 MED ORDER — FENTANYL CITRATE (PF) 100 MCG/2ML IJ SOLN
INTRAMUSCULAR | Status: AC
  Filled 2016-03-10: qty 2

## 2016-03-10 MED ORDER — TIROFIBAN HCL IN NACL 5-0.9 MG/100ML-% IV SOLN: 0.1500 ug/kg/min | INTRAVENOUS | Status: AC

## 2016-03-10 MED ORDER — MIDAZOLAM HCL 2 MG/2ML IJ SOLN
INTRAMUSCULAR | Status: DC | PRN
  Administered 2016-03-10: 1 mg via INTRAVENOUS

## 2016-03-10 MED ORDER — SODIUM CHLORIDE 0.9 % WEIGHT BASED INFUSION: 1.0000 mL/kg/h | INTRAVENOUS | Status: DC

## 2016-03-10 MED ORDER — LIDOCAINE HCL (PF) 1 % IJ SOLN
INTRAMUSCULAR | Status: AC
  Filled 2016-03-10: qty 30

## 2016-03-10 MED ORDER — NITROGLYCERIN IN D5W 200-5 MCG/ML-% IV SOLN
10.0000 ug/min | INTRAVENOUS | Status: DC
  Administered 2016-03-10: 10 ug/min via INTRAVENOUS
  Filled 2016-03-10: qty 250

## 2016-03-10 MED ORDER — HEPARIN (PORCINE) IN NACL 100-0.45 UNIT/ML-% IJ SOLN
1400.0000 [IU]/h | INTRAMUSCULAR | Status: DC
  Administered 2016-03-10: 1400 [IU]/h via INTRAVENOUS
  Filled 2016-03-10 (×2): qty 250

## 2016-03-10 MED ORDER — IOPAMIDOL (ISOVUE-370) INJECTION 76%
INTRAVENOUS | Status: DC | PRN
  Administered 2016-03-10: 180 mL via INTRA_ARTERIAL

## 2016-03-10 MED ORDER — AMLODIPINE BESYLATE 5 MG PO TABS
5.0000 mg | ORAL_TABLET | Freq: Every day | ORAL | Status: DC
  Administered 2016-03-10 – 2016-03-12 (×3): 5 mg via ORAL
  Filled 2016-03-10 (×3): qty 1

## 2016-03-10 MED ORDER — NITROGLYCERIN 1 MG/10 ML FOR IR/CATH LAB
INTRA_ARTERIAL | Status: AC
  Filled 2016-03-10: qty 10

## 2016-03-10 MED ORDER — ACETAMINOPHEN 325 MG PO TABS: 650.0000 mg | ORAL_TABLET | ORAL | Status: DC | PRN

## 2016-03-10 MED ORDER — SODIUM CHLORIDE 0.9 % WEIGHT BASED INFUSION: 3.0000 mL/kg/h | INTRAVENOUS | Status: DC

## 2016-03-10 SURGICAL SUPPLY — 17 items
BALLN EMERGE MR 2.5X12 (BALLOONS) ×2
BALLN ~~LOC~~ EUPHORA RX 3.75X20 (BALLOONS) ×2
BALLOON EMERGE MR 2.5X12 (BALLOONS) ×1 IMPLANT
BALLOON ~~LOC~~ EUPHORA RX 3.75X20 (BALLOONS) ×1 IMPLANT
CATH INFINITI 5FR ANG PIGTAIL (CATHETERS) ×2 IMPLANT
CATH INFINITI 5FR JL4 (CATHETERS) ×2 IMPLANT
CATH INFINITI JR4 5F (CATHETERS) ×2 IMPLANT
GLIDESHEATH SLEND SS 6F .021 (SHEATH) ×2 IMPLANT
GUIDE CATH RUNWAY 6FR FR4 (CATHETERS) ×2 IMPLANT
KIT HEART LEFT (KITS) ×2 IMPLANT
PACK CARDIAC CATHETERIZATION (CUSTOM PROCEDURE TRAY) ×2 IMPLANT
STENT PROMUS PREM MR 3.5X24 (Permanent Stent) ×2 IMPLANT
SYR MEDRAD MARK V 150ML (SYRINGE) ×2 IMPLANT
TRANSDUCER W/STOPCOCK (MISCELLANEOUS) ×2 IMPLANT
TUBING CIL FLEX 10 FLL-RA (TUBING) ×2 IMPLANT
WIRE COUGAR XT STRL 190CM (WIRE) ×2 IMPLANT
WIRE SAFE-T 1.5MM-J .035X260CM (WIRE) ×2 IMPLANT

## 2016-03-10 NOTE — ED Notes (Signed)
Per cardiology hold NTG at 15 mcg for now.

## 2016-03-10 NOTE — ED Provider Notes (Signed)
CSN: [id number]     Arrival date & time 03/10/16  1229 History   First MD Initiated Contact with Patient 03/10/16 1307     Chief Complaint  Patient presents with  . Chest Pain   Patient is a 50 y.o. male presenting with chest pain. The history is provided by the patient and the spouse.  Chest Pain Pain location:  Substernal area Pain quality: pressure   Pain radiates to:  L arm Pain severity:  Moderate Onset quality:  Gradual Duration:  12 hours Timing:  Constant Progression:  Worsening Chronicity:  Recurrent Relieved by:  Nothing Worsened by:  Deep breathing and exertion Associated symptoms: diaphoresis, fatigue, nausea and shortness of breath   Associated symptoms: no fever, no syncope and not vomiting   Risk factors: male sex and obesity   Risk factors: no prior DVT/PE   Patient with h/o Hypertension, TIA, previous smoker, presents with chest pressure and tightness since last night He reports associated SOB/diaphoresis He reports it radiates to left UE He reports that he has this pain on/off for a month but worse in past 12 hrs He tried mowing his grass today but decided to come and be evaluated  No significant long distance travel No h/o DVT/PE  Soc hx - former smoker Family history - father with MI in his 27s Past Medical History  Diagnosis Date  . Hypertension   . Ruptured disk   . Obesity    Past Surgical History  Procedure Laterality Date  . Back surgery      ruptured disk, L-S  . Cardiac catheterization  05/2004    minimal CAD   Family History  Problem Relation Age of Onset  . Hypertension Father   . Heart disease Father     MI  . Cancer Maternal Grandmother     lung  . Heart failure Paternal Grandfather    Social History  Substance Use Topics  . Smoking status: Former Smoker    Types: Cigarettes    Quit date: 01/18/2015  . Smokeless tobacco: Never Used  . Alcohol Use: 0.0 oz/week    0 Standard drinks or equivalent per week     Comment:  occasional    Review of Systems  Constitutional: Positive for diaphoresis and fatigue. Negative for fever.  Respiratory: Positive for chest tightness and shortness of breath.   Cardiovascular: Positive for chest pain. Negative for leg swelling and syncope.  Gastrointestinal: Positive for nausea. Negative for vomiting and blood in stool.  All other systems reviewed and are negative.     Allergies  Ace inhibitors  Home Medications   Prior to Admission medications   Medication Sig Start Date End Date Taking? Authorizing Provider  amLODipine (NORVASC) 5 MG tablet TAKE 1 TABLET (5 MG TOTAL) BY MOUTH DAILY. 02/10/16   Judy Pimple, MD  aspirin 81 MG tablet Take 81 mg by mouth daily.    Historical Provider, MD  atorvastatin (LIPITOR) 10 MG tablet TAKE 1 TABLET (10 MG TOTAL) BY MOUTH DAILY. 02/10/16   Judy Pimple, MD  hydrochlorothiazide (HYDRODIURIL) 25 MG tablet Take 0.5 tablets (12.5 mg total) by mouth daily. 02/10/16   Judy Pimple, MD  losartan (COZAAR) 100 MG tablet TAKE 1 TABLET (100 MG TOTAL) BY MOUTH DAILY. 02/10/16   Judy Pimple, MD  sildenafil (VIAGRA) 50 MG tablet Take 1 tablet (50 mg total) by mouth daily as needed. Take one by mouth once daily as directed as needed 30 minutes before sexual activity.  02/10/16   Audrie Gallus Tower, MD   BP 138/76 mmHg  Pulse 53  Temp(Src) 97.7 F (36.5 C) (Oral)  Resp 18  SpO2 96% Physical Exam CONSTITUTIONAL: Well developed/well nourished HEAD: Normocephalic/atraumatic EYES: EOMI/PERRL ENMT: Mucous membranes moist NECK: supple no meningeal signs SPINE/BACK:entire spine nontender CV: S1/S2 noted, no murmurs/rubs/gallops noted LUNGS: Lungs are clear to auscultation bilaterally, no apparent distress ABDOMEN: soft, nontender, no rebound or guarding, bowel sounds noted throughout abdomen He is obese GU:no cva tenderness NEURO: Pt is awake/alert/appropriate, moves all extremitiesx4.  No facial droop.   EXTREMITIES: pulses normal/equal, full ROM,  no calf tenderness/edema Pulses equal SKIN: warm, color normal PSYCH: no abnormalities of mood noted, alert and oriented to situation  ED Course  Procedures  CRITICAL CARE Performed by: Joya Gaskins Total critical care time: 50 minutes Critical care time was exclusive of separately billable procedures and treating other patients. Critical care was necessary to treat or prevent imminent or life-threatening deterioration. Critical care was time spent personally by me on the following activities: development of treatment plan with patient and/or surrogate as well as nursing, discussions with consultants, evaluation of patient's response to treatment, examination of patient, obtaining history from patient or surrogate, ordering and performing treatments and interventions, ordering and review of laboratory studies, ordering and review of radiographic studies, pulse oximetry and re-evaluation of patient's condition. PATIENT WITH SIGNIFICANT RISK FACTORS FOR CORONARY ARTERY DISEASE, PRESENTS WITH CHEST PRESSURE/SHORTNESS OF BREATH HE HAS ABNORMAL EKG.  HE HAS ELEVATED TROPONIN/NON-STEMI HE HAS BEEN STARTED ON ASA/NITROGLYCERIN AND HEPARIN  Labs Review Labs Reviewed  BASIC METABOLIC PANEL - Abnormal; Notable for the following:    Glucose, Bld 200 (*)    All other components within normal limits  CBC - Abnormal; Notable for the following:    Platelets 135 (*)    All other components within normal limits  I-STAT TROPOININ, ED - Abnormal; Notable for the following:    Troponin i, poc 0.51 (*)    All other components within normal limits  TROPONIN I  APTT  PROTIME-INR    Imaging Review Dg Chest 2 View  03/10/2016  CLINICAL DATA:  Left-sided chest pain began last night. Pain radiates down the left arm. EXAM: CHEST  2 VIEW COMPARISON:  None. FINDINGS: There is a poorly defined nodular density at the left lung base. This is not clearly identified on the lateral view. Otherwise, the lungs are  clear. Heart and mediastinum are within normal limits. The trachea is midline. No pleural effusions. Mild degenerative changes in the thoracic spine. No large pleural effusions. IMPRESSION: Poorly defined nodular density in the left lower chest is indeterminate. A nipple shadow is in the differential diagnosis but cannot exclude a lung nodular lesion. Consider follow-up chest radiograph with nipple markers. Alternately, this could be further evaluated with a chest CT. Electronically Signed   By: Richarda Overlie M.D.   On: 03/10/2016 13:24   I have personally reviewed and evaluated these images and lab results as part of my medical decision-making.   EKG Interpretation   Date/Time:  Saturday Mar 10 2016 12:35:54 EDT Ventricular Rate:  55 PR Interval:  180 QRS Duration: 102 QT Interval:  418 QTC Calculation: 399 R Axis:   -16 Text Interpretation:  Sinus bradycardia T wave inversion Abnormal ekg  Confirmed by Bebe Shaggy  MD, Dorinda Hill ([id number]) on 03/10/2016 1:07:31 PM Also  confirmed by Bebe Shaggy  MD,  ([id number]), editor [place] CT, Jola Babinski  [phone])  on 03/10/2016 1:16:07 PM  EKG Interpretation  Date/Time:  Saturday Mar 10 2016 13:34:19 EDT Ventricular Rate:  56 PR Interval:  175 QRS Duration: 104 QT Interval:  410 QTC Calculation: 396 R Axis:   -13 Text Interpretation:  Sinus rhythm Baseline wander in lead(s) II III aVF V6 Confirmed by Bebe ShaggyWICKLINE  MD, Dorinda HillNALD ([id number]) on 03/10/2016 1:37:25 PM      Medications  ondansetron (ZOFRAN-ODT) 4 MG disintegrating tablet (not administered)  aspirin chewable tablet 324 mg (not administered)  nitroGLYCERIN (NITROSTAT) SL tablet 0.4 mg (not administered)  ondansetron (ZOFRAN-ODT) disintegrating tablet 4 mg (4 mg Oral Given 03/10/16 1249)   1:48 PM PT SEEN AFTER ARRIVAL TO ROOM HE IS HEMODYNAMICALLY STABLE BUT WITH SIGNIFICANT HISTORY CONCERNING FOR ACS HE HAS NON-STEMI REPEAT EKG SHOWS NO SIGNIFICANT CHANGE   EKG Interpretation  Date/Time:  Saturday Mar 10 2016 13:34:19 EDT Ventricular Rate:  56 PR Interval:  175 QRS Duration: 104 QT Interval:  410 QTC Calculation: 396 R Axis:   -13 Text Interpretation:  Sinus rhythm Baseline wander in lead(s) II III aVF V6 Confirmed by Bebe ShaggyWICKLINE  MD, Dorinda HillNALD ([id number]) on 03/10/2016 1:37:25 PM      D/W DR Eden EmmsNISHAN WITH CARDIOLOGY HE WAS INFORMED OF NON-STEMI HE REQUESTS TO START HEPARIN PHARMACY CONSULT FOR HEPARIN 3:02 PM Pt seen in ED by dr Jens Somcrenshaw He was given nitro, heparin, ASA While awaiting admission, his pain worsened Repeat EKG shows likely inferior wall stemi D/w dr Sanjuana Kavamcalhaney D/w dr Jens Somcrenshaw who has reviewed EKG Code STEMI called   EKG Interpretation  Date/Time:  Saturday Mar 10 2016 14:56:28 EDT Ventricular Rate:  66 PR Interval:  64 QRS Duration: 126 QT Interval:  442 QTC Calculation: 463 R Axis:   -48 Text Interpretation:  Junctional rhythm Short PR interval IVCD, consider atypical RBBB LVH with IVCD, LAD and secondary repol abnrm Inferior infarct, acute (RCA) Probable RV involvement, suggest recording right precordial leads Abnormal ekg Confirmed by Bebe ShaggyWICKLINE  MD, Dorinda HillNALD ([id number]) on 03/10/2016 3:02:31 PM       MDM   Final diagnoses:  Non-STEMI (non-ST elevated myocardial infarction) [facility](HCC)    Nursing notes including past medical history and social history reviewed and considered in documentation xrays/imaging reviewed by myself and considered during evaluation Labs/vital reviewed myself and considered during evaluation     Zadie Rhineonald , MD 03/10/16 1504

## 2016-03-10 NOTE — ED Notes (Signed)
Cardiology at bedside.

## 2016-03-10 NOTE — ED Notes (Signed)
md notified of results 

## 2016-03-10 NOTE — Progress Notes (Signed)
TR BAND REMOVAL  LOCATION:    left radial  DEFLATED PER PROTOCOL:    Yes.    TIME BAND OFF / DRESSING APPLIED:    20:45   SITE UPON ARRIVAL:    Level 0  SITE AFTER BAND REMOVAL:    Level 0  CIRCULATION SENSATION AND MOVEMENT:    Within Normal Limits   Yes.    COMMENTS:   Post TR band instructions given. Pt tolerated well.

## 2016-03-10 NOTE — ED Notes (Signed)
Cath lab ready for pt.  

## 2016-03-10 NOTE — Progress Notes (Signed)
ANTICOAGULATION CONSULT NOTE - Initial Consult  Pharmacy Consult for Heparin Indication: chest pain/ACS  Allergies  Allergen Reactions  . Ace Inhibitors     REACTION: cough    Patient Measurements:   Actual wt 133kg (from 02/2016) IBW:  70kg Heparin Dosing Weight:  101kg  Vital Signs: Temp: 97.7 F (36.5 C) (05/06 1235) Temp Source: Oral (05/06 1235) BP: 138/76 mmHg (05/06 1235) Pulse Rate: 53 (05/06 1235)  Labs:  Recent Labs  03/10/16 1243  HGB 14.9  HCT 43.5  PLT 135*  CREATININE 0.97    CrCl cannot be calculated (Unknown ideal weight.).   Medical History: Past Medical History  Diagnosis Date  . Hypertension   . Ruptured disk   . Obesity     Medications:  Scheduled:  . aspirin  324 mg Oral Once  . ondansetron        Assessment: 50yo male presenting with chest pain, NSTEMI.  Pt does have a history of stroke 6760yr ago; he is on no anticoagulants at home.  Hg is wnl and pltc is mildly low.    Goal of Therapy:  Heparin level 0.3-0.7 units/ml Monitor platelets by anticoagulation protocol: Yes   Plan:  Heparin 4000units IV x 1, then 1400 units/hr Check heparin level in 6hr Daily HL and CBC Watch for s/s of bleeding   Marisue HumbleKendra , PharmD Clinical Pharmacist [facility]

## 2016-03-10 NOTE — H&P (Addendum)
History & Physical    Patient ID: Daniel [name] MRN: [id number], DOB/AGE: 52-Oct-1967   Admit date: 03/10/2016   Primary Physician: Daniel Manns, MD Primary Cardiologist: Daniel [name] - f/u [place]  Patient Profile    50 y/o ? with a h/o HTN, TIA, tob/etoh abuse who presented to the ED today with a 1 month h/o intermittent c/p.  Past Medical History    Past Medical History  Diagnosis Date  . Hypertension     a. 02/2015 echo: 55-65%, trace TR/MR.  . Ruptured disk   . Obesity   . TIA (transient ischemic attack)   . History of tobacco abuse   . ETOH abuse     Past Surgical History  Procedure Laterality Date  . Back surgery      ruptured disk, L-S  . Cardiac catheterization  05/2004    minimal CAD     Allergies  Allergies  Allergen Reactions  . Ace Inhibitors     REACTION: cough    History of Present Illness    50 y/o ? with a h/o HTN, TIA, tob/etoh abuse, and h/o cath in 2005  reportedly nl.  Over the past month, he has been having intermittent rest and exertional chest discomfort, described as being sharp in nature, lasting about 15  Mins, and worsened by deep breathing.  Beginning last night, he began to have a more persistent, heavy, chest pressure assoc w/ dyspnea.  This was different than prior chest pain episodes and due to persistence, he presented to the [facility] ED this AM.  Here, ECG showed sinus brady, 55, inf twi.  CXR showed "poorly defined nodular density in the left lower chest is indeterminate."  He continues to c/o chest pressure and mild dyspnea.  Trop is elevated @ 0.51.  Home Medications    Prior to Admission medications   Medication Sig Start Date End Date Taking? Authorizing Provider  amLODipine (NORVASC) 5 MG tablet TAKE 1 TABLET (5 MG TOTAL) BY MOUTH DAILY. 02/10/16  Yes Daniel Pimple, MD  aspirin 81 MG tablet Take 81 mg by mouth daily.   Yes Historical Provider, MD  atorvastatin (LIPITOR) 10 MG tablet TAKE 1 TABLET (10 MG TOTAL) BY MOUTH DAILY. 02/10/16   Yes Daniel Pimple, MD  hydrochlorothiazide (HYDRODIURIL) 25 MG tablet Take 0.5 tablets (12.5 mg total) by mouth daily. 02/10/16  Yes Daniel A Tower, MD  losartan (COZAAR) 100 MG tablet TAKE 1 TABLET (100 MG TOTAL) BY MOUTH DAILY. 02/10/16  Yes Daniel Pimple, MD  ranitidine (ZANTAC) 150 MG tablet Take 150 mg by mouth 2 (two) times daily.   Yes Historical Provider, MD  sildenafil (VIAGRA) 50 MG tablet Take 1 tablet (50 mg total) by mouth daily as needed. Take one by mouth once daily as directed as needed 30 minutes before sexual activity. 02/10/16  Yes Daniel Pimple, MD    Family History    Family History  Problem Relation Age of Onset  . Hypertension Father   . Heart disease Father     MI  . Cancer Maternal Grandmother     lung  . Heart failure Paternal Grandfather     Social History    Social History   Social History  . Marital Status: Married    Spouse Name: N/A  . Number of Children: 1  . Years of Education: N/A   Occupational History  . Haematologist    Social History Main Topics  . Smoking status: Former Smoker  Types: Cigarettes    Quit date: 01/18/2015  . Smokeless tobacco: Never Used  . Alcohol Use: 0.0 oz/week    0 Standard drinks or equivalent per week     Comment: about 3 12 packs/wk.  . Drug Use: No  . Sexual Activity:    Partners: Female   Other Topics Concern  . Not on file   Social History Narrative   [place]ives in [place].  Does not routinely exercise.     Review of Systems    General:  No chills, fever, night sweats or weight changes.  Cardiovascular:  +++ chest pain, +++ dyspnea on exertion, no edema, orthopnea, palpitations, paroxysmal nocturnal dyspnea. Dermatological: No rash, lesions/masses Respiratory: No cough, +++ dyspnea Urologic: No hematuria, dysuria Abdominal:   No nausea, vomiting, diarrhea, bright red blood per rectum, melena, or hematemesis Neurologic:  No visual changes, wkns, changes in mental status. All other systems  reviewed and are otherwise negative except as noted above.  Physical Exam    Blood pressure 133/84, pulse 57, temperature 97.7 F (36.5 C), temperature source Oral, resp. rate 15, SpO2 92 %.  General: Pleasant, NAD Skin: tattoos Psych: Normal affect. Neuro: Alert and oriented X 3. Moves all extremities spontaneously. HEENT: Normal  Neck: Supple without bruits or JVD. Lungs:  Resp regular and unlabored, CTA. Heart: RRR no s3, s4, or murmurs. Abdomen: Soft, non-tender, non-distended, BS + x 4.  Extremities: No clubbing, cyanosis or edema. DP/PT/Radials 2+ and equal bilaterally.  Labs    Troponin [facility](Point of Care Test)  Recent Labs  03/10/16 1248  TROPIPOC 0.51*    Lab Results  Component Value Date   WBC 6.6 03/10/2016   HGB 14.9 03/10/2016   HCT 43.5 03/10/2016   MCV 92.4 03/10/2016   PLT 135* 03/10/2016    Recent Labs Lab 03/10/16 1243  NA 136  K 4.3  CL 102  CO2 23  BUN 19  CREATININE 0.97  CALCIUM 9.2  GLUCOSE 200*   Lab Results  Component Value Date   CHOL 150 02/27/2015   HDL 34* 02/27/2015   LDLCALC 96 02/27/2015   TRIG 98 02/27/2015     Radiology Studies    Dg Chest 2 View  03/10/2016  CLINICAL DATA:  Left-sided chest pain began last night. Pain radiates down the left arm. EXAM: CHEST  2 VIEW COMPARISON:  None. FINDINGS: There is a poorly defined nodular density at the left lung base. This is not clearly identified on the lateral view. Otherwise, the lungs are clear. Heart and mediastinum are within normal limits. The trachea is midline. No pleural effusions. Mild degenerative changes in the thoracic spine. No large pleural effusions. IMPRESSION: Poorly defined nodular density in the left lower chest is indeterminate. A nipple shadow is in the differential diagnosis but cannot exclude a lung nodular lesion. Consider follow-up chest radiograph with nipple markers. Alternately, this could be further evaluated with a chest CT. Electronically Signed   By: Richarda OverlieAdam   Daniel [name] M.D.   On: 03/10/2016 13:24    ECG & Cardiac Imaging    Sinus brady, 55, left ward axis, inf twi (Daniel [name] compared with 02/2015).  Assessment & Plan    1.  NSTEMI:  Pt presented to the ED today with a one month h/o intermittent rest/exertional/pleuritic chest pain with subsequent  Signed, Nicolasa Duckinghristopher Berge, NP 03/10/2016, 2:07 PM As above, patient seen and examined. Briefly he is a 50 year old male with past medical history of hyperlipidemia, prior TIA, Hypertension with non-ST elevation  myocardial infarction. Patient developed substernal chest pressure at 3 AM. It radiated to his left upper extremity. There is associated dyspnea, nausea and diaphoresis. The pain persisted and he presented to the emergency room. He continues with 5 out of 10 pain which has improved since arrival. His troponin is 0.51. Initial electrocardiogram showed inferior T-wave changes that have now improved.  1 NSEMI-Plan aspirin, heparin, nitroglycerin. Add low-dose metoprolol as tolerated by pulse and blood pressure. He has had myalgias with high-dose Lipitor previously. We will try Crestor 40 mg daily. He will need cardiac catheterization. The risks and benefits including myocardial infarction, CVA and death were discussed and he agrees to proceed. If we can make him pain-free with medications we will proceed on Monday. Otherwise we will proceed urgently. 2 Hypertension-follow blood pressure and adjust medications as needed. 3 hyperlipidemia-we will try high-dose Crestor as outlined above. Olga Millers    Addendum: following IV heparin and NTG, CP transiently got worse and now back to persistent 5/10. FU ECG shows transient junctional rhythm or idioventricular rhythm. Plan to proceed with emergent cath now given persistent pain and abnormal troponin. Olga Millers

## 2016-03-10 NOTE — ED Notes (Signed)
Pt reports left side chest pain that started last night, feels like "something is sitting on his chest." pain radiates into left arm and pt reports sob. EKG done on arrival, airway intact.

## 2016-03-10 NOTE — ED Notes (Signed)
This RN noticed EKG changes on monitor, pain has increased, EKG obtained and showed to Dr. Bebe ShaggyWickline.

## 2016-03-11 ENCOUNTER — Other Ambulatory Visit (HOSPITAL_COMMUNITY): Payer: PRIVATE HEALTH INSURANCE

## 2016-03-11 ENCOUNTER — Inpatient Hospital Stay (HOSPITAL_COMMUNITY): Payer: PRIVATE HEALTH INSURANCE

## 2016-03-11 DIAGNOSIS — I2510 Atherosclerotic heart disease of native coronary artery without angina pectoris: Secondary | ICD-10-CM

## 2016-03-11 LAB — CBC
HEMATOCRIT: 39 % (ref 39.0–52.0)
HEMOGLOBIN: 13.2 g/dL (ref 13.0–17.0)
MCH: 31.6 pg (ref 26.0–34.0)
MCHC: 33.8 g/dL (ref 30.0–36.0)
MCV: 93.3 fL (ref 78.0–100.0)
Platelets: 128 10*3/uL — ABNORMAL LOW (ref 150–400)
RBC: 4.18 MIL/uL — AB (ref 4.22–5.81)
RDW: 12 % (ref 11.5–15.5)
WBC: 7 10*3/uL (ref 4.0–10.5)

## 2016-03-11 LAB — BASIC METABOLIC PANEL
ANION GAP: 10 (ref 5–15)
BUN: 13 mg/dL (ref 6–20)
CHLORIDE: 102 mmol/L (ref 101–111)
CO2: 25 mmol/L (ref 22–32)
Calcium: 8.8 mg/dL — ABNORMAL LOW (ref 8.9–10.3)
Creatinine, Ser: 1 mg/dL (ref 0.61–1.24)
GFR calc Af Amer: >60 mL/min (ref >60)
GLUCOSE: 144 mg/dL — AB (ref 65–99)
POTASSIUM: 3.7 mmol/L (ref 3.5–5.1)
Sodium: 137 mmol/L (ref 135–145)

## 2016-03-11 LAB — LIPID PANEL
CHOL/HDL RATIO: 3.1 ratio
Cholesterol: 106 mg/dL (ref 0–200)
HDL: 34 mg/dL — AB (ref >40)
LDL Cholesterol: 59 mg/dL (ref 0–99)
Triglycerides: 65 mg/dL (ref <150)
VLDL: 13 mg/dL (ref 0–40)

## 2016-03-11 LAB — TROPONIN I
Troponin I: 25.76 ng/mL (ref <0.031)
Troponin I: 33.03 ng/mL (ref <0.031)

## 2016-03-11 MED ORDER — PERFLUTREN LIPID MICROSPHERE
INTRAVENOUS | Status: AC
  Administered 2016-03-11: 4 mL
  Filled 2016-03-11: qty 10

## 2016-03-11 MED ORDER — FUROSEMIDE 10 MG/ML IJ SOLN
20.0000 mg | Freq: Once | INTRAMUSCULAR | Status: AC
  Administered 2016-03-11: 20 mg via INTRAVENOUS
  Filled 2016-03-11: qty 2

## 2016-03-11 MED ORDER — POTASSIUM CHLORIDE CRYS ER 20 MEQ PO TBCR
40.0000 meq | EXTENDED_RELEASE_TABLET | Freq: Once | ORAL | Status: AC
  Administered 2016-03-11: 40 meq via ORAL
  Filled 2016-03-11: qty 2

## 2016-03-11 MED ORDER — PERFLUTREN LIPID MICROSPHERE
1.0000 mL | INTRAVENOUS | Status: AC | PRN
  Filled 2016-03-11: qty 10

## 2016-03-11 NOTE — Progress Notes (Signed)
    Subjective:  Denies CP; complains of dyspnea   Objective:  Filed Vitals:   03/10/16 2100 03/10/16 2355 03/11/16 0000 03/11/16 0418  BP: 142/78 125/63 131/63 113/71  Pulse:  55  54  Temp:  98 F (36.7 C)  97.5 F (36.4 C)  TempSrc:  Oral  Oral  Resp: 31 22 21 17   Height:      Weight:    285 lb 9.6 oz (129.547 kg)  SpO2: 96% 97% 95% 96%    Intake/Output from previous day:  Intake/Output Summary (Last 24 hours) at 03/11/16 0729 Last data filed at 03/11/16 0600  Gross per 24 hour  Intake 368.75 ml  Output    700 ml  Net -331.25 ml    Physical Exam: Physical exam: Well-developed well-nourished in no acute distress.  Skin is warm and dry.  HEENT is normal.  Neck is supple.  Chest is clear to auscultation with normal expansion.  Cardiovascular exam is regular rate and rhythm.  Abdominal exam nontender or distended. No masses palpated. Extremities show no edema. Radial cath site with no hematoma neuro grossly intact    Lab Results: Basic Metabolic Panel:  Recent Labs  16/08/9604/06/17 1243 03/11/16 0502  NA 136 137  K 4.3 3.7  CL 102 102  CO2 23 25  GLUCOSE 200* 144*  BUN 19 13  CREATININE 0.97 1.00  CALCIUM 9.2 8.8*   CBC:  Recent Labs  03/10/16 1243 03/11/16 0502  WBC 6.6 7.0  HGB 14.9 13.2  HCT 43.5 39.0  MCV 92.4 93.3  PLT 135* 128*   Cardiac Enzymes:  Recent Labs  03/10/16 1805 03/10/16 2335 03/11/16 0502  TROPONINI 9.00* 33.03* 25.76*     Assessment/Plan:  1 status post myocardial infarction-patient is status post inferior infarct. He had PCI of his right coronary artery. Continue ASA and brilinta. He is having some dyspnea which may be related to brilinta. If it persists we could change to Plavix. Will give one dose of lasix although does not appear to be volume overloaded; LVEDP 21 at time of cath). Continue low-dose metoprolol and statin. Schedule echocardiogram. 2 abnormal chest x-ray-repeat PA and lateral chest x-ray tomorrow with  nipple markers. 3 Hypertension-continue present blood pressure medications and follow. 4 hyperlipidemia-continue statin.  Olga MillersBrian  03/11/2016, 7:29 AM

## 2016-03-11 NOTE — Progress Notes (Signed)
  Echocardiogram 2D Echocardiogram has been performed.  Delcie RochENNINGTON,  03/11/2016, 6:41 PM

## 2016-03-12 ENCOUNTER — Telehealth: Payer: Self-pay | Admitting: Cardiovascular Disease

## 2016-03-12 ENCOUNTER — Telehealth: Payer: Self-pay | Admitting: Cardiology

## 2016-03-12 ENCOUNTER — Inpatient Hospital Stay (HOSPITAL_COMMUNITY): Payer: PRIVATE HEALTH INSURANCE

## 2016-03-12 ENCOUNTER — Encounter (HOSPITAL_COMMUNITY): Payer: Self-pay | Admitting: Cardiovascular Disease

## 2016-03-12 DIAGNOSIS — I214 Non-ST elevation (NSTEMI) myocardial infarction: Secondary | ICD-10-CM

## 2016-03-12 DIAGNOSIS — E1169 Type 2 diabetes mellitus with other specified complication: Secondary | ICD-10-CM

## 2016-03-12 DIAGNOSIS — E785 Hyperlipidemia, unspecified: Secondary | ICD-10-CM

## 2016-03-12 LAB — BASIC METABOLIC PANEL
Anion gap: 10 (ref 5–15)
BUN: 14 mg/dL (ref 6–20)
CHLORIDE: 101 mmol/L (ref 101–111)
CO2: 26 mmol/L (ref 22–32)
Calcium: 8.9 mg/dL (ref 8.9–10.3)
Creatinine, Ser: 0.97 mg/dL (ref 0.61–1.24)
GFR calc Af Amer: >60 mL/min (ref >60)
GFR calc non Af Amer: >60 mL/min (ref >60)
GLUCOSE: 142 mg/dL — AB (ref 65–99)
Potassium: 3.9 mmol/L (ref 3.5–5.1)
SODIUM: 137 mmol/L (ref 135–145)

## 2016-03-12 LAB — CBC
HCT: 41.2 % (ref 39.0–52.0)
Hemoglobin: 14.3 g/dL (ref 13.0–17.0)
MCH: 33.2 pg (ref 26.0–34.0)
MCHC: 34.7 g/dL (ref 30.0–36.0)
MCV: 95.6 fL (ref 78.0–100.0)
PLATELETS: 132 10*3/uL — AB (ref 150–400)
RBC: 4.31 MIL/uL (ref 4.22–5.81)
RDW: 12.1 % (ref 11.5–15.5)
WBC: 7.3 10*3/uL (ref 4.0–10.5)

## 2016-03-12 LAB — HEMOGLOBIN A1C
Hgb A1c MFr Bld: 7.9 % — ABNORMAL HIGH (ref 4.8–5.6)
Mean Plasma Glucose: 180 mg/dL

## 2016-03-12 LAB — ECHOCARDIOGRAM COMPLETE
Height: 71 in
WEIGHTICAEL: 4569.6 [oz_av]

## 2016-03-12 LAB — MRSA PCR SCREENING: MRSA by PCR: POSITIVE — AB

## 2016-03-12 MED ORDER — NITROGLYCERIN 0.4 MG SL SUBL: 0.4000 mg | SUBLINGUAL_TABLET | SUBLINGUAL | Status: AC | PRN

## 2016-03-12 MED ORDER — TICAGRELOR 90 MG PO TABS: 90.0000 mg | ORAL_TABLET | Freq: Two times a day (BID) | ORAL | Status: DC

## 2016-03-12 MED ORDER — ATORVASTATIN CALCIUM 80 MG PO TABS: 80.0000 mg | ORAL_TABLET | Freq: Every day | ORAL | Status: DC

## 2016-03-12 MED ORDER — CARVEDILOL 6.25 MG PO TABS
6.2500 mg | ORAL_TABLET | Freq: Two times a day (BID) | ORAL | Status: DC
  Administered 2016-03-12: 6.25 mg via ORAL
  Filled 2016-03-12: qty 1

## 2016-03-12 MED ORDER — CARVEDILOL 6.25 MG PO TABS: 6.2500 mg | ORAL_TABLET | Freq: Two times a day (BID) | ORAL | Status: DC

## 2016-03-12 MED FILL — Lidocaine HCl Local Preservative Free (PF) Inj 1%: INTRAMUSCULAR | Qty: 30 | Status: AC

## 2016-03-12 MED FILL — Verapamil HCl IV Soln 2.5 MG/ML: INTRAVENOUS | Qty: 2 | Status: AC

## 2016-03-12 NOTE — Discharge Summary (Signed)
Discharge Summary    Patient ID: Daniel [name],  MRN: [id number], DOB/AGE: [birth year] 50 y.o.  Admit date: 03/10/2016 Discharge date: 03/12/2016  Primary Care Provider: [facility] Primary Cardiologist: New - F/U in [facility]  Discharge Diagnoses    Principal Problem:   NSTEMI (non-ST elevated myocardial infarction) [facility]) Active Problems:   History of tobacco abuse   HYPERTENSION, BENIGN ESSENTIAL   Dyslipidemia   History of Present Illness     Daniel [name] is a 50 y.o. male with past medical history of HTN, TIA, and prior tobacco abuse who presented to [facility] ED on 03/10/2016 for evaluation of chest pain.   The patient reported having intermittent episodes of chest pain for the past month. Described the pain as being sharp in quality and lasting approximately 15 minutes. The night prior to admission, he developed a heavy chest pressure which was associated with dyspnea. With the pain lasting for several hours, he came to the ED for further evaluation. While in the ED, his initial troponin was elevated to 0.51. His EKG showed new TWI in the inferior leads as compared to previous tracings. He was started on IV Heparin and admitted for further evaluation.   Hospital Course     Consultants: None   After being started on Heparin and IV NTG, he continued to have chest pain and his EKG showed transient junctional/ idioventricular rhythm. With these symptoms and EKG changes, he was taken for an emergent cardiac catheterization.  His cath showed 100% stenosis of the distal RCA. A DES was placed with a 0% residual stenosis. No obstructive disease was noted in the LAD or Circumflex. He was started on DAPT with ASA and Brilinta. He was also started on a BB and his statin was titrated to high-intensity Atorvastatin 80mg  in the setting of ACS. No complications were noted during the procedure.  The following morning, he denied any repeat episodes of chest pain. His troponin peaked at  33.03. Lipid Panel showed an LDL of 59, HDL 34, and total cholesterol of 106. A1c was elevated to 7.9. Post-cath, his creatinine was stable at 1.00. An echocardiogram showed a preserved EF of 55-60%.    He continued to deny any chest discomfort or dyspnea on 03/12/2016. He ambulated over 700 ft with cardiac rehab without difficulty. His CXR on admission showed a poorly defined nodular density in the left lower chest with follow-up imaging being recommended. A repeat CXR showed an ill-defined opacity in the left base slightly lateral to the left nipple and a noncontrast enhanced chest CT to further evaluate was recommended. Therefore, a CT Chest was obtained prior to discharge which showed no worrisome pulmonary nodules.   He was last examined by Dr. Elease Hashimoto and deemed stable for discharge. The patient wishes to follow-up in [facility] and wants Dr. Clifton James to be his primary Cardiologist. 7-10 day TCM appointment was arranged prior to discharge. He was provided a 30-day card to use with his Brilinta along with an annual refill of the medication. Medications below were sent to his pharmacy of choice. He will need to follow-up with his PCP in regards to his diabetes. He was discharged home in good condition.  _____________  Discharge Vitals Blood pressure 155/82, pulse 53, temperature 97.4 F (36.3 C), temperature source Oral, resp. rate 15, height 5\' 11"  (1.803 m), weight 281 lb 11.2 oz (127.778 kg), SpO2 97 %.  Filed Weights   03/10/16 1800 03/11/16 0418 03/12/16 0500  Weight: 284 lb  13.4 oz (129.2 kg) 285 lb 9.6 oz (129.547 kg) 281 lb 11.2 oz (127.778 kg)    Labs & Radiologic Studies     CBC  Recent Labs  03/11/16 0502 03/12/16 0509  WBC 7.0 7.3  HGB 13.2 14.3  HCT 39.0 41.2  MCV 93.3 95.6  PLT 128* 132*   Basic Metabolic Panel  Recent Labs  03/11/16 0502 03/12/16 0509  NA 137 137  K 3.7 3.9  CL 102 101  CO2 25 26  GLUCOSE 144* 142*  BUN 13 14  CREATININE 1.00 0.97  CALCIUM  8.8* 8.9   Liver Function Tests No results for input(s): AST, ALT, ALKPHOS, BILITOT, PROT, ALBUMIN in the last 72 hours. No results for input(s): LIPASE, AMYLASE in the last 72 hours. Cardiac Enzymes  Recent Labs  03/10/16 1805 03/10/16 2335 03/11/16 0502  TROPONINI 9.00* 33.03* 25.76*   BNP Invalid input(s): POCBNP D-Dimer No results for input(s): DDIMER in the last 72 hours. Hemoglobin A1C  Recent Labs  03/10/16 1805  HGBA1C 7.9*   Fasting Lipid Panel  Recent Labs  03/11/16 0502  CHOL 106  HDL 34*  LDLCALC 59  TRIG 65  CHOLHDL 3.1   Thyroid Function Tests  Recent Labs  03/10/16 1805  TSH 1.521    Dg Chest 2 View: 03/11/2016  CLINICAL DATA:  Ill-defined opacity left base EXAM: CHEST  2 VIEW COMPARISON:  Mar 10, 2016 FINDINGS: Current study performed with nipple markers in place. The ill-defined opacity noted in the left base is slightly lateral to the left nipple based on nipple marker placement. Lungs elsewhere are clear. Heart size and pulmonary vascularity are normal. No adenopathy. No bone lesions. IMPRESSION: Ill-defined opacity in the left base is slightly lateral to the left nipple. Given this circumstance, noncontrast enhanced chest CT to further evaluate is advisable. Lungs elsewhere clear. Cardiac silhouette within normal limits. Electronically Signed   By: Bretta Bang III M.D.   On: 03/11/2016 09:16   Dg Chest 2 View: 03/10/2016  CLINICAL DATA:  Left-sided chest pain began last night. Pain radiates down the left arm. EXAM: CHEST  2 VIEW COMPARISON:  None. FINDINGS: There is a poorly defined nodular density at the left lung base. This is not clearly identified on the lateral view. Otherwise, the lungs are clear. Heart and mediastinum are within normal limits. The trachea is midline. No pleural effusions. Mild degenerative changes in the thoracic spine. No large pleural effusions. IMPRESSION: Poorly defined nodular density in the left lower chest is  indeterminate. A nipple shadow is in the differential diagnosis but cannot exclude a lung nodular lesion. Consider follow-up chest radiograph with nipple markers. Alternately, this could be further evaluated with a chest CT. Electronically Signed   By: Richarda Overlie M.D.   On: 03/10/2016 13:24   Ct Chest Wo Contrast: 03/12/2016  CLINICAL DATA:  Abnormal chest radiograph. CT recommended further evaluation. EXAM: CT CHEST WITHOUT CONTRAST TECHNIQUE: Multidetector CT imaging of the chest was performed following the standard protocol without IV contrast. COMPARISON:  Chest radiograph 03/11/2016 FINDINGS: Mediastinum/Nodes: No axillary or supraclavicular lymphadenopathy. No mediastinal hilar adenopathy. No pericardial fluid. Coronary artery calcifications are present. Esophagus normal. Lungs/Pleura: No discrete pulmonary parenchymal nodule within the lingula or LEFT lower lobe to correspond to the plain film abnormality. There is pleural thickening at the RIGHT lung base as well as epicardial fat which likely accounts for the radiographic density. No RIGHT lung lesion present. Upper abdomen: Limited view of the liver, kidneys, and  pancreas are unremarkable. Normal adrenal glands. Hepatic steatosis noted Musculoskeletal: No aggressive osseous lesion per IMPRESSION: 1. No worrisome pulmonary nodules. 2. Hepatic steatosis. 3. Coronary artery calcifications. Electronically Signed   By: Genevive Bi M.D.   On: 03/12/2016 10:38    Diagnostic Studies/Procedures    Cardiac Catheterization: 03/10/2016     Prox RCA lesion, 30% stenosed.  Dist RCA lesion, 100% stenosed. Post intervention, there is a 0% residual stenosis.  1. NSTEMI secondary to occluded distal RCA 2. Successful PTCA/DES of the distal RCA 3. No obstructive disease in the LAD or Circumflex 4. Mild LV systolic dysfunction.   Recommendations: Will continue DAPT with ASA and Brilinta. Will continue statin, beta blocker. Echo tomorrow. Admit to  stepdown.   Echocardiogram: 03/11/2016  Study Conclusions - Procedure narrative: Transthoracic echocardiography. Image  quality was adequate. The study was technically difficult, as a  result of poor sound wave transmission. Intravenous contrast  (Definity) was administered. - Left ventricle: The cavity size was mildly dilated. Wall  thickness was increased in a pattern of mild LVH. Systolic  function was normal. The estimated ejection fraction was in the  range of 55% to 60%. Images were inadequate for LV wall motion  assessment. The study is not technically sufficient to allow  evaluation of LV diastolic function. - Left atrium: The atrium was normal in size. - Inferior vena cava: The vessel was normal in size. The  respirophasic diameter changes were in the normal range (>= 50%),  consistent with normal central venous pressure.  Impressions: - Technically difficult study with poor echo windows. Definity  contrast was minimally helpful. There appears to be grossly  normal LV systolic function, however, I cannot fully evaluate  wall motion.    Disposition   Pt is being discharged home today in good condition.  Follow-up Plans & Appointments    Follow-up Information    Follow up with Tereso Newcomer, PA-C On 03/20/2016.   Specialties:  Cardiology, Physician Assistant   Why:  Cardiology Hospital Follow-Up on 03/20/2016 at 8:30AM.    Contact information:   [address] [place] [address] [phone]      Discharge Instructions    Amb Referral to Cardiac Rehabilitation    Complete by:  As directed   Diagnosis:   NSTEMI Coronary Stents       Diet - low sodium heart healthy    Complete by:  As directed      Discharge instructions    Complete by:  As directed   PLEASE REMEMBER TO BRING ALL OF YOUR MEDICATIONS TO EACH OF YOUR FOLLOW-UP OFFICE VISITS.  PLEASE ATTEND ALL SCHEDULED FOLLOW-UP APPOINTMENTS.   Activity: Increase activity slowly  as tolerated. You may shower, but no soaking baths (or swimming) for 1 week. No driving for 24 hours. No lifting over 5 lbs for 1 week. No sexual activity for 1 week.   You May Return to Work: in 1 week (if applicable)  Wound Care: You may wash cath site gently with soap and water. Keep cath site clean and dry. If you notice pain, swelling, bleeding or pus at your cath site, please call [phone].     Increase activity slowly    Complete by:  As directed            Discharge Medications   Current Discharge Medication List    START taking these medications   Details  carvedilol (COREG) 6.25 MG tablet Take 1 tablet (6.25 mg total) by  mouth 2 (two) times daily with a meal. Qty: 60 tablet, Refills: 6    nitroGLYCERIN (NITROSTAT) 0.4 MG SL tablet Place 1 tablet (0.4 mg total) under the tongue every 5 (five) minutes as needed for chest pain (CP or SOB). Qty: 25 tablet, Refills: 3    ticagrelor (BRILINTA) 90 MG TABS tablet Take 1 tablet (90 mg total) by mouth 2 (two) times daily. Qty: 180 tablet, Refills: 0      CONTINUE these medications which have CHANGED   Details  atorvastatin (LIPITOR) 80 MG tablet Take 1 tablet (80 mg total) by mouth daily at 6 PM. Qty: 30 tablet, Refills: 6      CONTINUE these medications which have NOT CHANGED   Details  amLODipine (NORVASC) 5 MG tablet TAKE 1 TABLET (5 MG TOTAL) BY MOUTH DAILY. Qty: 30 tablet, Refills: 11    aspirin 81 MG tablet Take 81 mg by mouth daily.    losartan (COZAAR) 100 MG tablet TAKE 1 TABLET (100 MG TOTAL) BY MOUTH DAILY. Qty: 30 tablet, Refills: 11    ranitidine (ZANTAC) 150 MG tablet Take 150 mg by mouth 2 (two) times daily.    sildenafil (VIAGRA) 50 MG tablet Take 1 tablet (50 mg total) by mouth daily as needed. Take one by mouth once daily as directed as needed 30 minutes before sexual activity. Qty: 10 tablet, Refills: 11      STOP taking these medications     hydrochlorothiazide (HYDRODIURIL) 25 MG tablet           Aspirin prescribed at discharge?  Yes High Intensity Statin Prescribed? (Lipitor 40-80mg  or Crestor 20-40mg ): Yes Beta Blocker Prescribed? Yes For EF 45% or less, Was ACEI/ARB Prescribed? Yes ADP Receptor Inhibitor Prescribed? (i.e. Plavix etc.-Includes Medically Managed Patients): Yes For EF <40%, Aldosterone Inhibitor Prescribed? No: N/A Was EF assessed during THIS hospitalization? Yes - Cath and Echo Was Cardiac Rehab II ordered? (Included Medically managed Patients): Yes   Allergies Allergies  Allergen Reactions  . Ace Inhibitors     REACTION: cough     Outstanding Labs/Studies   None  Duration of Discharge Encounter   Greater than 30 minutes including physician time.  Signed, Ellsworth LennoxBrittany M Strader, PA-C 03/12/2016, 1:06 PM   Attending Note:   The patient was seen and examined.  Agree with assessment and plan as noted above.  Changes made to the above note as needed.  Pt has done well CT of the chest did not show any suspicious lesions. Will have him return to work in 1 week .  Vesta MixerPhilip J. , Montez HagemanJr., MD, [facility] 03/12/2016, 2:38 PM [address],  [address] Office [phone] Pager [phone]

## 2016-03-12 NOTE — Progress Notes (Signed)
CARDIAC REHAB PHASE I   PRE:  Rate/Rhythm: 64 SR  BP:  Supine: 140/78  Sitting:   Standing:    SaO2: 96%RA  MODE:  Ambulation: 700 ft   POST:  Rate/Rhythm: 86 SR  BP:  Supine: 143/86  Sitting:   Standing:    SaO2: 100%RA 0945-1037 Pt walked 700 ft without CP. Tolerated well. MI education completed with pt and wife who voiced understanding. Stressed importance of brillinta with stent. Asked case manager to see re brilitna. Reviewed MI restrictions, NTG use, heart healthy diet watching carbs, cutting down on alcohol, ex ed and CRP 2. [facility] [place] [employer]   Luetta Nuttingharlene , RN BSN  03/12/2016 10:33 AM

## 2016-03-12 NOTE — Progress Notes (Signed)
Discharged  home by wheelchair stable, Accompanied by wife, discharged instructions given to pt. Belongings  Given to pt.

## 2016-03-12 NOTE — Progress Notes (Signed)
    Subjective:  Denies CP; complains of dyspnea Ready to go home. CXR shows a left basilar lesion Will need to get a CT    Objective:  Filed Vitals:   03/12/16 0424 03/12/16 0500 03/12/16 0539 03/12/16 0734  BP: 151/100  152/97 159/87  Pulse:    53  Temp: 97.8 F (36.6 C)   97.3 F (36.3 C)  TempSrc: Oral   Oral  Resp: 18  15 15   Height:      Weight:  281 lb 11.2 oz (127.778 kg)    SpO2: 98%  95% 95%    Intake/Output from previous day:  Intake/Output Summary (Last 24 hours) at 03/12/16 16100823 Last data filed at 03/12/16 0500  Gross per 24 hour  Intake    606 ml  Output   2100 ml  Net  -1494 ml    Physical Exam: Physical exam: Well-developed well-nourished in no acute distress.  Skin is warm and dry.  HEENT is normal.  Neck is supple.  Chest is clear to auscultation with normal expansion.  Cardiovascular exam is regular rate and rhythm.  Abdominal exam nontender or distended. No masses palpated. Extremities show no edema. Radial cath site with no hematoma neuro grossly intact    Lab Results: Basic Metabolic Panel:  Recent Labs  96/02/5404/07/17 0502 03/12/16 0509  NA 137 137  K 3.7 3.9  CL 102 101  CO2 25 26  GLUCOSE 144* 142*  BUN 13 14  CREATININE 1.00 0.97  CALCIUM 8.8* 8.9   CBC:  Recent Labs  03/11/16 0502 03/12/16 0509  WBC 7.0 7.3  HGB 13.2 14.3  HCT 39.0 41.2  MCV 93.3 95.6  PLT 128* 132*   Cardiac Enzymes:  Recent Labs  03/10/16 1805 03/10/16 2335 03/11/16 0502  TROPONINI 9.00* 33.03* 25.76*     Assessment/Plan:  1 status post myocardial infarction-patient is status post inferior infarct. He had PCI of his right coronary artery. Continue ASA and brilinta. He is having some dyspnea which may be related to brilinta. If it persists we could change to Plavix.     He is still hypertensive.   Will change the metoprolol to Coreg.   Echocardiogram to be done today   Anticipate DC today  He needs a return to work note.   He will  return to work in 1 week - May 15.  . 2 abnormal chest x-ray-repeat PA and lateral chest x-ray   with nipple markers shows that the opacity in the left base persists. Will get a noncontrast enhanced CT   3 Hypertension-changed metoprolol to coreg.   4 hyperlipidemia-continue statin.  Daniel [name] 03/12/2016, 8:23 AM

## 2016-03-12 NOTE — Care Management Note (Signed)
Case Management Note  Patient Details  Name: Daniel CharonDavid G [name] MRN: [id number] Date of Birth: 1966-04-14  Subjective/Objective:      Adm w mi              Action/Plan: lives w fam, pcp dr tower   Expected Discharge Date:                  Expected Discharge Plan:  Home/Self Care  In-House Referral:     Discharge planning Services  CM Consult, Medication Assistance  Post Acute Care Choice:    Choice offered to:     DME Arranged:    DME Agency:     HH Arranged:    HH Agency:     Status of Service:  Completed, signed off  Medicare Important Message Given:    Date Medicare IM Given:    Medicare IM give by:    Date Additional Medicare IM Given:    Additional Medicare Important Message give by:     If discussed at Long Length of Stay Meetings, dates discussed:    Additional Comments: gave pt 30day free brilinta card and copay card for brilinta. Maybe dc home today. States has good ins for meds.  Hanley Haysowell,  T, RN 03/12/2016, 10:30 AM

## 2016-03-12 NOTE — Telephone Encounter (Signed)
7-10 DAY TOC FU APPT PER BRITTAINY-APPT 03-20-16 AT 830A WITH SCOTT

## 2016-03-12 NOTE — Telephone Encounter (Signed)
Spoke with mark wilson, he reports the pt tested positive for MRSA during last admission. Dr Jens Somcrenshaw made aware

## 2016-03-13 NOTE — Telephone Encounter (Signed)
LMTCB

## 2016-03-13 NOTE — Telephone Encounter (Signed)
Left message to call back  

## 2016-03-14 NOTE — Telephone Encounter (Signed)
Called pt home, no VM setup.  Left message to call back on pt cell phone.

## 2016-03-19 NOTE — Progress Notes (Signed)
Cardiology Office Note:    Date:  03/20/2016   ID:  [name], DOB [birth year], MRN [id number]  PCP:  Roxy Manns, MD  Cardiologist:  New was to FU in [facility] >> patient requested [place] FU with Dr. Verne Carrow  Electrophysiologist:  n/a  Referring MD: Judy Pimple, MD   Chief Complaint  Patient presents with  . Hospitalization Follow-up    s/p NSTEMI    History of Present Illness:     Daniel [name] is a 50 y.o. male ex-smoker with a hx of HTN, prior TIA.    Admitted 5/6-5/8 with non-STEMI. He had prolonged chest discomfort in the emergency room with abnormal troponins and development of transient junctional/idioventricular rhythm. He was taken emergently to the cardiac catheterization lab which demonstrated distal RCA occlusion which was treated with a DES. Of note, hemoglobin A1c was 7.9. Echocardiogram demonstrated preserved LV function with an EF of 55-60%. Of note, chest x-ray demonstrated poorly defined nodular density left lower chest.  CT demonstrated no worrisome pulmonary nodules.  Returns for FU.  Here today with his wife. Since discharge, he has been doing fairly well. He has a lot of side effects to Brilinta. He notes some tightness in his chest with certain activities as well as when he gets aggravated. He also notes dyspnea at different times as well as with exertion. His symptoms have been getting better since he left the hospital. He denies syncope. Denies orthopnea or edema. He has awoken short of breath at times. Denies any bleeding. He does complain of a blurriness in his left eye. It feels like there is a film over his eye. He denies any visual field cuts or amaurosis fugax. Denies facial droop or unilateral weakness. The symptoms were present prior to his myocardial infarction. They have continued to get worse over time.  Past Medical History  Diagnosis Date  . Hypertension     a. 02/2015 echo: 55-65%, trace TR/MR. b. 03/2016: echo with EF of  55-60%.   . Ruptured disk   . Obesity   . CVA (cerebral infarction)     a. 02/2015 dyarthria 2/2 CVA involving the lateral aspect of the precentral gyrus.  . History of tobacco abuse   . ETOH abuse   . DM2 (diabetes mellitus, type 2) (HCC)     a. pre-diabetic in the past. b. A1c 03/2016 elevated to 7.9.  Marland Kitchen Chronic low back pain     a. s/p back surgery.  Marland Kitchen CAD (coronary artery disease)     a. NSTEMI: 100% stenosis of the distal RCA --> DES placed  . History of echocardiogram     a. Mild LVH, EF 55-60%, Definity contrast used-LV wall motion could not be adequately assessed  . History of non-ST elevation myocardial infarction (NSTEMI) 03/2016    a. PCI: 3.5 x 24 mm Promus Premier DES to distal RCA    Past Surgical History  Procedure Laterality Date  . Back surgery      ruptured disk, L-S  . Cardiac catheterization  05/2004    minimal CAD  . Cardiac catheterization N/A 03/10/2016    Procedure: Left Heart Cath and Coronary Angiography;  Surgeon: Kathleene Hazel, MD;  Location: [facility] INVASIVE CV LAB;  Service: Cardiovascular;  Laterality: N/A;  . Cardiac catheterization N/A 03/10/2016    Procedure: Coronary Stent Intervention;  Surgeon: Kathleene Hazel, MD;  Location: [facility] INVASIVE CV LAB;  Service: Cardiovascular;  Laterality: N/A;    Current Medications: Outpatient Prescriptions  Prior to Visit  Medication Sig Dispense Refill  . amLODipine (NORVASC) 5 MG tablet TAKE 1 TABLET (5 MG TOTAL) BY MOUTH DAILY. 30 tablet 11  . aspirin 81 MG tablet Take 81 mg by mouth daily.    Marland Kitchen atorvastatin (LIPITOR) 80 MG tablet Take 1 tablet (80 mg total) by mouth daily at 6 PM. 30 tablet 6  . carvedilol (COREG) 6.25 MG tablet Take 1 tablet (6.25 mg total) by mouth 2 (two) times daily with a meal. 60 tablet 6  . losartan (COZAAR) 100 MG tablet TAKE 1 TABLET (100 MG TOTAL) BY MOUTH DAILY. 30 tablet 11  . nitroGLYCERIN (NITROSTAT) 0.4 MG SL tablet Place 1 tablet (0.4 mg total) under the tongue every 5  (five) minutes as needed for chest pain (CP or SOB). 25 tablet 3  . ranitidine (ZANTAC) 150 MG tablet Take 150 mg by mouth 2 (two) times daily as needed for heartburn.     . sildenafil (VIAGRA) 50 MG tablet Take 1 tablet (50 mg total) by mouth daily as needed. Take one by mouth once daily as directed as needed 30 minutes before sexual activity. 10 tablet 11  . ticagrelor (BRILINTA) 90 MG TABS tablet Take 1 tablet (90 mg total) by mouth 2 (two) times daily. 180 tablet 0   No facility-administered medications prior to visit.      Allergies:   Ace inhibitors   Social History   Social History  . Marital Status: Married    Spouse Name: N/A  . Number of Children: 1  . Years of Education: N/A   Occupational History  . Haematologist    Social History Main Topics  . Smoking status: Former Smoker    Types: Cigarettes    Quit date: 01/18/2015  . Smokeless tobacco: Never Used  . Alcohol Use: 0.0 oz/week    0 Standard drinks or equivalent per week     Comment: about 3 12 packs/wk.  . Drug Use: No  . Sexual Activity:    Partners: Female   Other Topics Concern  . None   Social History Narrative   Lives in [place] with wife.  Does not routinely exercise.     Family History:  The patient's family history includes Cancer in his maternal grandmother; Heart disease in his father; Heart failure in his paternal grandfather; Hypertension in his father.   ROS:   Please see the history of present illness.    Review of Systems  Cardiovascular: Positive for dyspnea on exertion.  Respiratory: Positive for shortness of breath.    All other systems reviewed and are negative.   Physical Exam:    VS:  BP 110/64 mmHg  Pulse 62  Ht 5\' 11"  (1.803 m)  Wt 278 lb 3.2 oz (126.191 kg)  BMI 38.82 kg/m2   GEN: Well nourished, well developed, in no acute distress HEENT: normal, PERRL  Neck: no JVD, no masses Cardiac: Normal S1/S2, RRR; no murmurs, rubs, or gallops, no edema;  right wrist without  hematoma or mass    Respiratory:  clear to auscultation bilaterally; no wheezing, rhonchi or rales GI: soft, nontender, nondistended MS: no deformity or atrophy Skin: warm and dry Neuro: No focal deficits  Psych: Alert and oriented x 3, normal affect  Wt Readings from Last 3 Encounters:  03/20/16 278 lb 3.2 oz (126.191 kg)  03/12/16 281 lb 11.2 oz (127.778 kg)  02/10/16 292 lb 12 oz (132.791 kg)      Studies/Labs Reviewed:  EKG:  EKG is  ordered today.  The ekg ordered today demonstrates NSR, HR 62, LAD, inferior Q waves, QTc 391 ms  Recent Labs: 03/10/2016: TSH 1.521 03/12/2016: BUN 14; Creatinine, Ser 0.97; Hemoglobin 14.3; Platelets 132*; Potassium 3.9; Sodium 137   Recent Lipid Panel    Component Value Date/Time   CHOL 106 03/11/2016 0502   CHOL 150 02/27/2015 0645   TRIG 65 03/11/2016 0502   TRIG 98 02/27/2015 0645   HDL 34* 03/11/2016 0502   HDL 34* 02/27/2015 0645   CHOLHDL 3.1 03/11/2016 0502   VLDL 13 03/11/2016 0502   VLDL 20 02/27/2015 0645   LDLCALC 59 03/11/2016 0502   LDLCALC 96 02/27/2015 0645    Additional studies/ records that were reviewed today include:   Chest CT 03/12/16 IMPRESSION: 1. No worrisome pulmonary nodules. 2. Hepatic steatosis. 3. Coronary artery calcifications.  Echo 03/11/16 Mild LVH, EF 55-60%, Definity contrast used-LV wall motion could not be adequately assessed  LHC 03/10/16 LAD ok RI small vessel LCx ok RCA prox 30%, distal 100% EF 45-50% PCI: 3.5 x 24 mm Promus Premier DES to distal RCA 1. NSTEMI secondary to occluded distal RCA 2. Successful PTCA/DES of the distal RCA 3. No obstructive disease in the LAD or Circumflex 4. Mild LV systolic dysfunction.  Recommendations: Will continue DAPT with ASA and Brilinta. Will continue statin, beta blocker. Echo tomorrow. Admit to stepdown.   Carotid US 4/16 IMPRESSION: Small amount of calcified plaque at the right carotid bulb and proximal internal carotid artery, and minimal  calcified plaque at the left carotid bulb and proximal internal carotid artery. No evidence of carotid stenosis. Normal antegrade flow noted within the vertebral arteries bilaterally.  ASSESSMENT:     1. History of non-ST elevation myocardial infarction (NSTEMI)   2. Coronary artery disease involving native coronary artery of native heart without angina pectoris   3. Essential hypertension   4. Hyperlipidemia   5. Controlled type 2 diabetes mellitus with other circulatory complication, without long-term current use of insulin (HCC)   6. Visual changes     PLAN:     In order of problems listed above:  1. Status post MI - He denies recurrent angina. He is having SEs to Brilinta with dyspnea and chest tightness. These seem to be getting better. We discussed trying to continue the medication for longer to see if his side effects will resolve. If not, we will need to change him to Plavix. We discussed the importance of dual at the platelet therapy. He is not interested in formal cardiac rehabilitation.  Continue aspirin, Brilinta, beta blocker, high-dose statin.  2. CAD - status post recent inferior MI treated with DES to the RCA. He had no significant disease elsewhere. Continue aspirin, Brilinta, statin, beta blocker.  3. HTN - Blood pressure is controlled.  4. HL - Continue Lipitor 80. He has had some myalgias and arthralgias with this dose. If these continue, consider changing to Lipitor 40 versus Crestor 20. Arrange lipids and LFTs in 6 weeks.  5. Diabetes - This is a new diagnosis. Follow-up with primary care.  6. Visual changes - These changes do not sound associated to his cardiac catheterization. He needs to follow-up with ophthalmology. He sees his primary care doctor soon and can obtain a referral through her.   Medication Adjustments/Labs and Tests Ordered: Current medicines are reviewed at length with the patient today.  Concerns regarding medicines are outlined above.   Medication changes, Labs and Tests ordered today  are outlined in the Patient Instructions noted below. Patient Instructions  Medication Instructions:  YOU HAVE BEEN GIVEN AN RX FOR BRILINTA Labwork: YOU HAVE BEEN AN RX TO HAVE LAB WORK FOR YOUR CHOLESTEROL PANEL TO BE DONE IN 6 WEEK; PLEASE HAVE THE RESULTS FAXED TO [place], PAC [phone]. Testing/Procedures: NONE Follow-Up: DR. Clifton JamesMCALHANY IN 2 MONTHS; I WILL HAVE HIS NURSE PAT, RN CALL YOU WITH AN APPT Any Other Special Instructions Will Be Listed Below (If Applicable). If you need a refill on your cardiac medications before your next appointment, please call your pharmacy.    Signed, Tereso NewcomerScott Weaver, PA-C  03/20/2016 9:16 AM    [facility] [address], [place], [place]  [address] Phone: [phone]) [phone]; Fax: [phone]

## 2016-03-20 ENCOUNTER — Encounter: Payer: Self-pay | Admitting: Physician Assistant

## 2016-03-20 ENCOUNTER — Ambulatory Visit (INDEPENDENT_AMBULATORY_CARE_PROVIDER_SITE_OTHER): Payer: PRIVATE HEALTH INSURANCE | Admitting: Physician Assistant

## 2016-03-20 VITALS — BP 110/64 | HR 62 | Ht 71.0 in | Wt 278.2 lb

## 2016-03-20 DIAGNOSIS — I252 Old myocardial infarction: Secondary | ICD-10-CM | POA: Diagnosis not present

## 2016-03-20 DIAGNOSIS — E1159 Type 2 diabetes mellitus with other circulatory complications: Secondary | ICD-10-CM

## 2016-03-20 DIAGNOSIS — E785 Hyperlipidemia, unspecified: Secondary | ICD-10-CM

## 2016-03-20 DIAGNOSIS — I10 Essential (primary) hypertension: Secondary | ICD-10-CM

## 2016-03-20 DIAGNOSIS — I2510 Atherosclerotic heart disease of native coronary artery without angina pectoris: Secondary | ICD-10-CM | POA: Diagnosis not present

## 2016-03-20 DIAGNOSIS — H539 Unspecified visual disturbance: Secondary | ICD-10-CM

## 2016-03-20 MED ORDER — TICAGRELOR 90 MG PO TABS
90.0000 mg | ORAL_TABLET | Freq: Two times a day (BID) | ORAL | Status: DC
Start: 2016-03-20 — End: 2016-07-11

## 2016-03-20 NOTE — Patient Instructions (Addendum)
Medication Instructions:  YOU HAVE BEEN GIVEN AN RX FOR BRILINTA Labwork: YOU HAVE BEEN AN RX TO HAVE LAB WORK FOR YOUR CHOLESTEROL PANEL TO BE DONE IN 6 WEEK; PLEASE HAVE THE RESULTS FAXED TO [place], PAC [phone]. Testing/Procedures: NONE Follow-Up: DR. Clifton JamesMCALHANY IN 2 MONTHS; I WILL HAVE HIS NURSE PAT, RN CALL YOU WITH AN APPT Any Other Special Instructions Will Be Listed Below (If Applicable). If you need a refill on your cardiac medications before your next appointment, please call your pharmacy.

## 2016-04-03 ENCOUNTER — Telehealth: Payer: Self-pay | Admitting: Physician Assistant

## 2016-04-03 NOTE — Telephone Encounter (Signed)
LVM for patient to return my call 

## 2016-04-09 LAB — HM DIABETES FOOT EXAM

## 2016-04-13 ENCOUNTER — Other Ambulatory Visit (INDEPENDENT_AMBULATORY_CARE_PROVIDER_SITE_OTHER): Payer: PRIVATE HEALTH INSURANCE

## 2016-04-13 DIAGNOSIS — I10 Essential (primary) hypertension: Secondary | ICD-10-CM

## 2016-04-13 DIAGNOSIS — Z125 Encounter for screening for malignant neoplasm of prostate: Secondary | ICD-10-CM

## 2016-04-13 LAB — CBC WITH DIFFERENTIAL/PLATELET
BASOS PCT: 0.4 % (ref 0.0–3.0)
Basophils Absolute: 0 10*3/uL (ref 0.0–0.1)
EOS PCT: 6.2 % — AB (ref 0.0–5.0)
Eosinophils Absolute: 0.5 10*3/uL (ref 0.0–0.7)
HEMATOCRIT: 46.3 % (ref 39.0–52.0)
HEMOGLOBIN: 15.7 g/dL (ref 13.0–17.0)
LYMPHS PCT: 26.4 % (ref 12.0–46.0)
Lymphs Abs: 2.4 10*3/uL (ref 0.7–4.0)
MCHC: 33.9 g/dL (ref 30.0–36.0)
MCV: 95.7 fl (ref 78.0–100.0)
MONO ABS: 0.9 10*3/uL (ref 0.1–1.0)
MONOS PCT: 9.7 % (ref 3.0–12.0)
Neutro Abs: 5.1 10*3/uL (ref 1.4–7.7)
Neutrophils Relative %: 57.3 % (ref 43.0–77.0)
Platelets: 157 10*3/uL (ref 150.0–400.0)
RBC: 4.83 Mil/uL (ref 4.22–5.81)
RDW: 13 % (ref 11.5–15.5)
WBC: 8.9 10*3/uL (ref 4.0–10.5)

## 2016-04-13 LAB — PSA: PSA: 0.28 ng/mL (ref 0.10–4.00)

## 2016-04-13 LAB — COMPREHENSIVE METABOLIC PANEL
ALBUMIN: 4.5 g/dL (ref 3.5–5.2)
ALT: 87 U/L — ABNORMAL HIGH (ref 0–53)
AST: 49 U/L — ABNORMAL HIGH (ref 0–37)
Alkaline Phosphatase: 116 U/L (ref 39–117)
BILIRUBIN TOTAL: 0.9 mg/dL (ref 0.2–1.2)
BUN: 26 mg/dL — AB (ref 6–23)
CALCIUM: 10.1 mg/dL (ref 8.4–10.5)
CHLORIDE: 101 meq/L (ref 96–112)
CO2: 30 mEq/L (ref 19–32)
CREATININE: 1.26 mg/dL (ref 0.40–1.50)
GFR: 63.85 mL/min (ref >60.00)
Glucose, Bld: 132 mg/dL — ABNORMAL HIGH (ref 70–99)
Potassium: 4.5 mEq/L (ref 3.5–5.1)
Sodium: 140 mEq/L (ref 135–145)
TOTAL PROTEIN: 7.6 g/dL (ref 6.0–8.3)

## 2016-04-13 LAB — LIPID PANEL
CHOLESTEROL: 89 mg/dL (ref 0–200)
HDL: 33.5 mg/dL — ABNORMAL LOW (ref >39.00)
LDL CALC: 44 mg/dL (ref 0–99)
NonHDL: 55.46
Total CHOL/HDL Ratio: 3
Triglycerides: 57 mg/dL (ref 0.0–149.0)
VLDL: 11.4 mg/dL (ref 0.0–40.0)

## 2016-04-13 LAB — TSH: TSH: 2.38 u[IU]/mL (ref 0.35–4.50)

## 2016-04-16 ENCOUNTER — Ambulatory Visit (INDEPENDENT_AMBULATORY_CARE_PROVIDER_SITE_OTHER): Payer: PRIVATE HEALTH INSURANCE | Admitting: Family Medicine

## 2016-04-16 ENCOUNTER — Encounter: Payer: Self-pay | Admitting: Family Medicine

## 2016-04-16 VITALS — BP 118/72 | HR 62 | Temp 98.4°F | Ht 69.0 in | Wt 271.5 lb

## 2016-04-16 DIAGNOSIS — I10 Essential (primary) hypertension: Secondary | ICD-10-CM | POA: Diagnosis not present

## 2016-04-16 DIAGNOSIS — E119 Type 2 diabetes mellitus without complications: Secondary | ICD-10-CM

## 2016-04-16 DIAGNOSIS — E785 Hyperlipidemia, unspecified: Secondary | ICD-10-CM

## 2016-04-16 DIAGNOSIS — R739 Hyperglycemia, unspecified: Secondary | ICD-10-CM | POA: Insufficient documentation

## 2016-04-16 DIAGNOSIS — R7401 Elevation of levels of liver transaminase levels: Secondary | ICD-10-CM | POA: Insufficient documentation

## 2016-04-16 DIAGNOSIS — R740 Nonspecific elevation of levels of transaminase and lactic acid dehydrogenase [LDH]: Secondary | ICD-10-CM

## 2016-04-16 NOTE — Assessment & Plan Note (Signed)
Improved with 80 mg lipitor s/p MI  Tolerating well but ast/alt are slt elevated-will watch this

## 2016-04-16 NOTE — Progress Notes (Signed)
Pre visit review using our clinic review tool, if applicable. No additional management support is needed unless otherwise documented below in the visit note. 

## 2016-04-16 NOTE — Progress Notes (Signed)
Subjective:    Patient ID: Daniel [name], male    DOB: January 10, 1966, 50 y.o.   MRN: [id number]  HPI Here for f/u of chronic health problems   Wt is down 7lb with bmi of 40  Eating better - lots of salads/ gave up soda  Trying to get strength back   bp is stable today  No cp or palpitations or headaches or edema  No side effects to medicines -doing well with them- on cozaar and hctz and coreg and amlodipine  BP Readings from Last 3 Encounters:  04/16/16 118/72  03/20/16 110/64  03/12/16 155/82    Goal is to prevent cva and MI      Chemistry      Component Value Date/Time   NA 140 04/13/2016 1058   NA 136 02/27/2015 0645   K 4.5 04/13/2016 1058   K 4.0 02/27/2015 0645   CL 101 04/13/2016 1058   CL 106 02/27/2015 0645   CO2 30 04/13/2016 1058   CO2 26 02/27/2015 0645   BUN 26* 04/13/2016 1058   BUN 16 02/27/2015 0645   CREATININE 1.26 04/13/2016 1058   CREATININE 1.04 02/27/2015 0645   CREATININE 1.11 03/02/2011 1625      Component Value Date/Time   CALCIUM 10.1 04/13/2016 1058   CALCIUM 8.6* 02/27/2015 0645   ALKPHOS 116 04/13/2016 1058   ALKPHOS 112 02/26/2015 1315   AST 49* 04/13/2016 1058   AST 52* 02/26/2015 1315   ALT 87* 04/13/2016 1058   ALT 57 02/26/2015 1315   BILITOT 0.9 04/13/2016 1058   BILITOT 0.7 02/26/2015 1315       Glucose 132- DM2 Lab Results  Component Value Date   HGBA1C 7.9* 03/10/2016  he is on arb Last eye exam - just had one / cataract needs surgery right now  wife cut all the sugar out of his diet    LFTs are mildly high  He was taken off ibuprofen and changed to tylenol-now needing that less also  Cut back on alcohol - cut back to 12 beers per week  Changed to a much lighter beer now    Hx of hyperlipidemia Lab Results  Component Value Date   CHOL 89 04/13/2016   CHOL 106 03/11/2016   CHOL 150 02/27/2015   Lab Results  Component Value Date   HDL 33.50* 04/13/2016   HDL 34* 03/11/2016   HDL 34* 02/27/2015   Lab  Results  Component Value Date   LDLCALC 44 04/13/2016   LDLCALC 59 03/11/2016   LDLCALC 96 02/27/2015   Lab Results  Component Value Date   TRIG 57.0 04/13/2016   TRIG 65 03/11/2016   TRIG 98 02/27/2015   Lab Results  Component Value Date   CHOLHDL 3 04/13/2016   CHOLHDL 3.1 03/11/2016   CHOLHDL 4 11/16/2013   No results found for: LDLDIRECT Very well controlled on lipitor 80 mg  When he is doing more exercise -HDL will improve Is up to 7 minutes at a time on a machine - gets out of breath (cardiac rehab)   Patient Active Problem List   Diagnosis Date Noted  . DM2 (diabetes mellitus, type 2) (HCC) 04/16/2016  . Elevated transaminase level 04/16/2016  . Coronary artery disease involving native coronary artery of native heart without angina pectoris 03/20/2016  . History of non-ST elevation myocardial infarction (NSTEMI) 03/20/2016  . Dyslipidemia 03/12/2016  . NSTEMI (non-ST elevated myocardial infarction) (HCC) 03/10/2016  . Constipation 02/10/2016  . Prostate  cancer screening 02/10/2016  . CVA (cerebral infarction) 03/01/2015  . Right knee pain 04/06/2014  . Benign paroxysmal positional vertigo 03/10/2014  . BPH (benign prostatic hyperplasia) 11/16/2013  . Routine general medical examination at a health care facility 11/12/2013  . Morbid obesity (HCC) 03/02/2011  . VITAMIN B12 DEFICIENCY 09/29/2007  . ERECTILE DYSFUNCTION 07/01/2007  . History of tobacco abuse 07/01/2007  . LOW BACK PAIN, CHRONIC 07/01/2007  . HYPERTENSION, BENIGN ESSENTIAL 06/03/2007   Past Medical History  Diagnosis Date  . Hypertension     a. 02/2015 echo: 55-65%, trace TR/MR. b. 03/2016: echo with EF of 55-60%.   . Ruptured disk   . Obesity   . CVA (cerebral infarction)     a. 02/2015 dyarthria 2/2 CVA involving the lateral aspect of the precentral gyrus.  . History of tobacco abuse   . ETOH abuse   . DM2 (diabetes mellitus, type 2) (HCC)     a. pre-diabetic in the past. b. A1c 03/2016  elevated to 7.9.  Marland Kitchen Chronic low back pain     a. s/p back surgery.  Marland Kitchen CAD (coronary artery disease)     a. NSTEMI: 100% stenosis of the distal RCA --> DES placed  . History of echocardiogram     a. Mild LVH, EF 55-60%, Definity contrast used-LV wall motion could not be adequately assessed  . History of non-ST elevation myocardial infarction (NSTEMI) 03/2016    a. PCI: 3.5 x 24 mm Promus Premier DES to distal RCA   Past Surgical History  Procedure Laterality Date  . Back surgery      ruptured disk, L-S  . Cardiac catheterization  05/2004    minimal CAD  . Cardiac catheterization N/A 03/10/2016    Procedure: Left Heart Cath and Coronary Angiography;  Surgeon: Kathleene Hazel, MD;  [facility]: [facility] INVASIVE CV LAB;  Service: Cardiovascular;  Laterality: N/A;  . Cardiac catheterization N/A 03/10/2016    Procedure: Coronary Stent Intervention;  Surgeon: Kathleene Hazel, MD;  Location: [facility] INVASIVE CV LAB;  Service: Cardiovascular;  Laterality: N/A;   Social History  Substance Use Topics  . Smoking status: Former Smoker    Types: Cigarettes    Quit date: 01/18/2015  . Smokeless tobacco: Never Used  . Alcohol Use: 0.0 oz/week    0 Standard drinks or equivalent per week     Comment: about 3 12 packs/wk.   Family History  Problem Relation Age of Onset  . Hypertension Father   . Heart disease Father     MI  . Cancer Maternal Grandmother     lung  . Heart failure Paternal Grandfather    Allergies  Allergen Reactions  . Ace Inhibitors     REACTION: cough   Current Outpatient Prescriptions on File Prior to Visit  Medication Sig Dispense Refill  . amLODipine (NORVASC) 5 MG tablet TAKE 1 TABLET (5 MG TOTAL) BY MOUTH DAILY. 30 tablet 11  . aspirin 81 MG tablet Take 81 mg by mouth daily.    Marland Kitchen atorvastatin (LIPITOR) 80 MG tablet Take 1 tablet (80 mg total) by mouth daily at 6 PM. 30 tablet 6  . carvedilol (COREG) 6.25 MG tablet Take 1 tablet (6.25 mg total) by mouth 2 (two) times  daily with a meal. 60 tablet 6  . hydrochlorothiazide (HYDRODIURIL) 25 MG tablet TAKE 1/2 TABLET BY MOUTH ONCE DAILY  11  . losartan (COZAAR) 100 MG tablet TAKE 1 TABLET (100 MG TOTAL) BY MOUTH DAILY. 30 tablet 11  .  nitroGLYCERIN (NITROSTAT) 0.4 MG SL tablet Place 1 tablet (0.4 mg total) under the tongue every 5 (five) minutes as needed for chest pain (CP or SOB). 25 tablet 3  . ranitidine (ZANTAC) 150 MG tablet Take 150 mg by mouth 2 (two) times daily as needed for heartburn.     . sildenafil (VIAGRA) 50 MG tablet Take 1 tablet (50 mg total) by mouth daily as needed. Take one by mouth once daily as directed as needed 30 minutes before sexual activity. 10 tablet 11  . ticagrelor (BRILINTA) 90 MG TABS tablet Take 1 tablet (90 mg total) by mouth 2 (two) times daily. 180 tablet 3   No current facility-administered medications on file prior to visit.     Review of Systems Review of Systems  Constitutional: Negative for fever, appetite change,  and unexpected weight change.  Eyes: Negative for pain and visual disturbance.  Respiratory: Negative for cough and pos for exertional shortness of breath.   Cardiovascular: Negative for cp or palpitations    Gastrointestinal: Negative for nausea, diarrhea and constipation.  Genitourinary: Negative for urgency and frequency.  Skin: Negative for pallor or rash   Neurological: Negative for weakness, light-headedness, numbness and headaches.  Hematological: Negative for adenopathy. Does not bruise/bleed easily.  Psychiatric/Behavioral: Negative for dysphoric mood. The patient is not nervous/anxious.         Objective:   Physical Exam  Constitutional: He appears well-developed and well-nourished. No distress.  Morbidly obese and well appearing   HENT:  Head: Normocephalic and atraumatic.  Mouth/Throat: Oropharynx is clear and moist.  Eyes: Conjunctivae and EOM are normal. Pupils are equal, round, and reactive to light.  Neck: Normal range of motion.  Neck supple. No JVD present. Carotid bruit is not present. No thyromegaly present.  Cardiovascular: Normal rate, regular rhythm, normal heart sounds and intact distal pulses.  Exam reveals no gallop.   Pulmonary/Chest: Effort normal and breath sounds normal. No respiratory distress. He has no wheezes. He has no rales.  No crackles  bs are somewhat distant  Abdominal: Soft. Bowel sounds are normal. He exhibits no distension, no abdominal bruit and no mass. There is no tenderness.  Musculoskeletal: He exhibits no edema or tenderness.  Lymphadenopathy:    He has no cervical adenopathy.  Neurological: He is alert. He has normal reflexes. No cranial nerve deficit. He exhibits normal muscle tone. Coordination normal.  Skin: Skin is warm and dry. No rash noted. No pallor.  Psychiatric: He has a normal mood and affect.          Assessment & Plan:   Problem List Items Addressed This Visit      Cardiovascular and Mediastinum   HYPERTENSION, BENIGN ESSENTIAL - Primary    bp in fair control at this time  BP Readings from Last 1 Encounters:  04/16/16 118/72   No changes needed Disc lifstyle change with low sodium diet and exercise  Pt recovering from last MI and getting more fit and loosing wt         Endocrine   DM2 (diabetes mellitus, type 2) (HCC)    Lab Results  Component Value Date   HGBA1C 7.9* 03/10/2016   Expect this is coming down with wt loss and low glycemic diet Now more exercise- inc as tolerated s/p MI F/u 3 mo with lab prior On arb for renal protection opth utd Will start oral hypoglycemic if not at goal         Other   Morbid obesity (  HCC)    Discussed how this problem influences overall health and the risks it imposes  Reviewed plan for weight loss with lower calorie diet (via better food choices and also portion control or program like weight watchers) and exercise building up to or more than 30 minutes 5 days per week including some aerobic activity    Commended on effort and wt loss so far      Elevated transaminase level    May be multifactorial Obesity--poss fatty liver  Acetaminophen and also etoh intake High dose lipitor  No symptoms Will continue to monitor       Dyslipidemia    Improved with 80 mg lipitor s/p MI  Tolerating well but ast/alt are slt elevated-will watch this

## 2016-04-16 NOTE — Assessment & Plan Note (Signed)
May be multifactorial Obesity--poss fatty liver  Acetaminophen and also etoh intake High dose lipitor  No symptoms Will continue to monitor

## 2016-04-16 NOTE — Assessment & Plan Note (Signed)
Lab Results  Component Value Date   HGBA1C 7.9* 03/10/2016   Expect this is coming down with wt loss and low glycemic diet Now more exercise- inc as tolerated s/p MI F/u 3 mo with lab prior On arb for renal protection opth utd Will start oral hypoglycemic if not at goal

## 2016-04-16 NOTE — Patient Instructions (Signed)
Keep advancing exercise as tolerated  Also stick to a low fat and low sugar diet Follow up in about 3 months with labs prior - we will re check diabetes and also watch liver function  Minimize alcohol the best you can and also only take acetaminophen when you really need it (tylenol)

## 2016-04-16 NOTE — Assessment & Plan Note (Signed)
bp in fair control at this time  BP Readings from Last 1 Encounters:  04/16/16 118/72   No changes needed Disc lifstyle change with low sodium diet and exercise  Pt recovering from last MI and getting more fit and loosing wt

## 2016-04-16 NOTE — Assessment & Plan Note (Signed)
Discussed how this problem influences overall health and the risks it imposes  Reviewed plan for weight loss with lower calorie diet (via better food choices and also portion control or program like weight watchers) and exercise building up to or more than 30 minutes 5 days per week including some aerobic activity   Commended on effort and wt loss so far  

## 2016-05-03 ENCOUNTER — Telehealth: Payer: Self-pay | Admitting: Cardiovascular Disease

## 2016-05-03 NOTE — Telephone Encounter (Signed)
New Message  Pt daughter call stating she was told she would be given a call to schedule an appt with Dr. Clifton JamesMcAlhany by his nurse at last appt on 03/20/16. Pt daughter states she never received call and did not want to schedule the next avail. Pt daughter wanted to know if pt would be able to be seen sooner. Please call back to discuss

## 2016-05-03 NOTE — Telephone Encounter (Signed)
Patient was to follow-up with Dr. Clifton JamesMcAlhany in 2 months. Found patient an appointment for August 17, which is 3 month follow-up. This was the closest date to two month follow-up. If patient needs to be seen sooner, will have Dr. Gibson RampMcAlhany's nurse call patient back if changes need to be made.

## 2016-06-20 NOTE — Progress Notes (Signed)
Chief Complaint  Patient presents with  . Coronary Artery Disease     History of Present Illness: 50 yo male with history of CAD, DM, HTN, prior CVA here today for cardiac follow up. He was admitted to [facility] May 2017 with a NSTEMI. His distal RCA was totally occluded. A Promus DES was placed in the distal RCA. Echo with normal LV function.   He is here today for follow up. He is feeling well. No chest pain. Occasional dyspnea. No near syncope or syncope.   Primary Care Physician: Roxy Manns, MD   Past Medical History:  Diagnosis Date  . CAD (coronary artery disease)    a. NSTEMI: 100% stenosis of the distal RCA --> DES placed  . Chronic low back pain    a. s/p back surgery.  . CVA (cerebral infarction)    a. 02/2015 dyarthria 2/2 CVA involving the lateral aspect of the precentral gyrus.  . DM2 (diabetes mellitus, type 2) (HCC)    a. pre-diabetic in the past. b. A1c 03/2016 elevated to 7.9.  . ETOH abuse   . History of echocardiogram    a. Mild LVH, EF 55-60%, Definity contrast used-LV wall motion could not be adequately assessed  . History of non-ST elevation myocardial infarction (NSTEMI) 03/2016   a. PCI: 3.5 x 24 mm Promus Premier DES to distal RCA  . History of tobacco abuse   . Hypertension    a. 02/2015 echo: 55-65%, trace TR/MR. b. 03/2016: echo with EF of 55-60%.   . Obesity   . Ruptured disk     Past Surgical History:  Procedure Laterality Date  . BACK SURGERY     ruptured disk, L-S  . CARDIAC CATHETERIZATION  05/2004   minimal CAD  . CARDIAC CATHETERIZATION N/A 03/10/2016   Procedure: Left Heart Cath and Coronary Angiography;  Surgeon: Kathleene Hazel, MD;  Location[facility] INVASIVE CV LAB;  Service: Cardiovascular;  Laterality: N/A;  . CARDIAC CATHETERIZATION N/A 03/10/2016   Procedure: Coronary Stent Intervention;  Surgeon: Kathleene Hazel, MD;  Location: [facility] INVASIVE CV LAB;  Service: Cardiovascular;  Laterality: N/A;    Current Outpatient  Prescriptions  Medication Sig Dispense Refill  . amLODipine (NORVASC) 5 MG tablet TAKE 1 TABLET (5 MG TOTAL) BY MOUTH DAILY. 30 tablet 11  . aspirin 81 MG tablet Take 81 mg by mouth daily.    Marland Kitchen atorvastatin (LIPITOR) 80 MG tablet Take 1 tablet (80 mg total) by mouth daily at 6 PM. 30 tablet 6  . carvedilol (COREG) 6.25 MG tablet Take 1 tablet (6.25 mg total) by mouth 2 (two) times daily with a meal. 60 tablet 6  . hydrochlorothiazide (HYDRODIURIL) 25 MG tablet TAKE 1/2 TABLET BY MOUTH ONCE DAILY  11  . losartan (COZAAR) 100 MG tablet TAKE 1 TABLET (100 MG TOTAL) BY MOUTH DAILY. 30 tablet 11  . nitroGLYCERIN (NITROSTAT) 0.4 MG SL tablet Place 1 tablet (0.4 mg total) under the tongue every 5 (five) minutes as needed for chest pain (CP or SOB). 25 tablet 3  . ranitidine (ZANTAC) 150 MG tablet Take 150 mg by mouth 2 (two) times daily as needed for heartburn.     . sildenafil (VIAGRA) 50 MG tablet Take 1 tablet (50 mg total) by mouth daily as needed. Take one by mouth once daily as directed as needed 30 minutes before sexual activity. 10 tablet 11  . ticagrelor (BRILINTA) 90 MG TABS tablet Take 1 tablet (90 mg total) by mouth 2 (  two) times daily. 180 tablet 3   No current facility-administered medications for this visit.     Allergies  Allergen Reactions  . Ace Inhibitors     REACTION: cough    Social History   Social History  . Marital status: Married    Spouse name: N/A  . Number of children: 1  . Years of education: N/A   Occupational History  . Haematologiststeel worker    Social History Main Topics  . Smoking status: Former Smoker    Types: Cigarettes    Quit date: 01/18/2015  . Smokeless tobacco: Never Used  . Alcohol use 0.0 oz/week     Comment: about 3 12 packs/wk.  . Drug use: No  . Sexual activity: Yes    Partners: Female   Other Topics Concern  . Not on file   Social History Narrative   Lives in [place] with wife.  Does not routinely exercise.    Family History  Problem  Relation Age of Onset  . Hypertension Father   . Heart disease Father     MI  . Cancer Maternal Grandmother     lung  . Heart failure Paternal Grandfather     Review of Systems:  As stated in the HPI and otherwise negative.   BP 132/70   Pulse 60   Ht 5\' 10"  (1.778 m)   Wt 270 lb 6.4 oz (122.7 kg)   SpO2 97%   BMI 38.80 kg/m   Physical Examination: General: Well developed, well nourished, NAD  HEENT: OP clear, mucus membranes moist  SKIN: warm, dry. No rashes. Neuro: No focal deficits  Musculoskeletal: Muscle strength 5/5 all ext  Psychiatric: Mood and affect normal  Neck: No JVD, no carotid bruits, no thyromegaly, no lymphadenopathy.  Lungs:Clear bilaterally, no wheezes, rhonci, crackles Cardiovascular: Regular rate and rhythm. No murmurs, gallops or rubs. Abdomen:Soft. Bowel sounds present. Non-tender.  Extremities: No lower extremity edema. Pulses are 2 + in the bilateral DP/PT.  Cardiac cath May 2017: Conclusion    Prox RCA lesion, 30% stenosed.  Dist RCA lesion, 100% stenosed. Post intervention, there is a 0% residual stenosis.   1. NSTEMI secondary to occluded distal RCA 2. Successful PTCA/DES of the distal RCA 3. No obstructive disease in the LAD or Circumflex 4. Mild LV systolic dysfunction.    Echo May 2017: Procedure narrative: Transthoracic echocardiography. Image   quality was adequate. The study was technically difficult, as a   result of poor sound wave transmission. Intravenous contrast   (Definity) was administered. - Left ventricle: The cavity size was mildly dilated. Wall   thickness was increased in a pattern of mild LVH. Systolic   function was normal. The estimated ejection fraction was in the   range of 55% to 60%. Images were inadequate for LV wall motion   assessment. The study is not technically sufficient to allow   evaluation of LV diastolic function. - Left atrium: The atrium was normal in size. - Inferior vena cava: The vessel was  normal in size. The   respirophasic diameter changes were in the normal range (>= 50%),   consistent with normal central venous pressure.  EKG:  EKG is notordered today. The ekg ordered today demonstrates   Recent Labs: 04/13/2016: ALT 87; BUN 26; Creatinine, Ser 1.26; Hemoglobin 15.7; Platelets 157.0; Potassium 4.5; Sodium 140; TSH 2.38   Lipid Panel    Component Value Date/Time   CHOL 89 04/13/2016 1058   CHOL 150 02/27/2015 0645  TRIG 57.0 04/13/2016 1058   TRIG 98 02/27/2015 0645   HDL 33.50 (L) 04/13/2016 1058   HDL 34 (L) 02/27/2015 0645   CHOLHDL 3 04/13/2016 1058   VLDL 11.4 04/13/2016 1058   VLDL 20 02/27/2015 0645   LDLCALC 44 04/13/2016 1058   LDLCALC 96 02/27/2015 0645     Wt Readings from Last 3 Encounters:  06/21/16 270 lb 6.4 oz (122.7 kg)  04/16/16 271 lb 8 oz (123.2 kg)  03/20/16 278 lb 3.2 oz (126.2 kg)     Other studies Reviewed: Additional studies/ records that were reviewed today include: . Review of the above records demonstrates:   Assessment and Plan:   1. CAD: s/p recent NSTEMI may 2017 with occluded RCA treated with Promus DES. He has had no chest pain but he has had some dyspnea since being on Brilinta. This is stable. Continue aspirin, Brilinta, beta blocker, high-dose statin.  2. HTN: Blood pressure is controlled.  3. HLD: Continue Lipitor 80 mg daily. He is tolerating well.   4. Pre-operative cardiovascular examination: He can proceed with his planned cataract surgery. He will not have to hold ASA or Brilinta for this procedure.   Current medicines are reviewed at length with the patient today.  The patient does not have concerns regarding medicines.  The following changes have been made:  no change  Labs/ tests ordered today include:  No orders of the defined types were placed in this encounter.    Disposition:   FU with me in 6 months   Signed, Verne Carrowhristopher McAlhany, MD 06/21/2016 12:08 PM    [facility]  [facility] [address], [place], [place]  [address] Phone: [phone]; Fax: [phone]

## 2016-06-21 ENCOUNTER — Ambulatory Visit (INDEPENDENT_AMBULATORY_CARE_PROVIDER_SITE_OTHER): Payer: PRIVATE HEALTH INSURANCE | Admitting: Cardiovascular Disease

## 2016-06-21 ENCOUNTER — Encounter: Payer: Self-pay | Admitting: Cardiovascular Disease

## 2016-06-21 VITALS — BP 132/70 | HR 60 | Ht 70.0 in | Wt 270.4 lb

## 2016-06-21 DIAGNOSIS — I2510 Atherosclerotic heart disease of native coronary artery without angina pectoris: Secondary | ICD-10-CM | POA: Diagnosis not present

## 2016-06-21 DIAGNOSIS — E785 Hyperlipidemia, unspecified: Secondary | ICD-10-CM | POA: Diagnosis not present

## 2016-06-21 DIAGNOSIS — I10 Essential (primary) hypertension: Secondary | ICD-10-CM | POA: Diagnosis not present

## 2016-06-21 DIAGNOSIS — Z01810 Encounter for preprocedural cardiovascular examination: Secondary | ICD-10-CM

## 2016-06-27 ENCOUNTER — Telehealth: Payer: Self-pay | Admitting: Cardiology

## 2016-06-27 NOTE — Telephone Encounter (Signed)
Walk in pt form-pt nedds RTW note with restrictions and ETC.. Will give to Thurston Holenne L when she Is back in the office on Thursday.

## 2016-06-28 ENCOUNTER — Encounter: Payer: Self-pay | Admitting: Cardiovascular Disease

## 2016-07-04 ENCOUNTER — Encounter: Payer: Self-pay | Admitting: *Deleted

## 2016-07-05 ENCOUNTER — Encounter: Payer: Self-pay | Admitting: *Deleted

## 2016-07-05 NOTE — Pre-Procedure Instructions (Signed)
CLEARED BY CARDIOLOGIST 06/21/16

## 2016-07-10 ENCOUNTER — Other Ambulatory Visit: Payer: Self-pay | Admitting: Student

## 2016-07-10 DIAGNOSIS — I252 Old myocardial infarction: Secondary | ICD-10-CM

## 2016-07-10 DIAGNOSIS — I2510 Atherosclerotic heart disease of native coronary artery without angina pectoris: Secondary | ICD-10-CM

## 2016-07-11 ENCOUNTER — Other Ambulatory Visit: Payer: Self-pay | Admitting: Family Medicine

## 2016-07-11 ENCOUNTER — Other Ambulatory Visit: Payer: Self-pay | Admitting: Student

## 2016-07-11 DIAGNOSIS — I252 Old myocardial infarction: Secondary | ICD-10-CM

## 2016-07-11 DIAGNOSIS — I2510 Atherosclerotic heart disease of native coronary artery without angina pectoris: Secondary | ICD-10-CM

## 2016-07-12 ENCOUNTER — Telehealth: Payer: Self-pay | Admitting: *Deleted

## 2016-07-12 ENCOUNTER — Ambulatory Visit: Payer: PRIVATE HEALTH INSURANCE | Admitting: Anesthesiology

## 2016-07-12 ENCOUNTER — Encounter
Admission: RE | Disposition: A | Discharge status: Home / Self Care | Payer: Self-pay | Source: Ambulatory Visit | Attending: Ophthalmology

## 2016-07-12 ENCOUNTER — Ambulatory Visit
Admission: RE | Admit: 2016-07-12 | Discharge: 2016-07-12 | Disposition: A | Discharge status: Home / Self Care | Payer: PRIVATE HEALTH INSURANCE | Source: Ambulatory Visit | Attending: Ophthalmology | Admitting: Ophthalmology

## 2016-07-12 DIAGNOSIS — Z87891 Personal history of nicotine dependence: Secondary | ICD-10-CM | POA: Insufficient documentation

## 2016-07-12 DIAGNOSIS — H2512 Age-related nuclear cataract, left eye: Secondary | ICD-10-CM | POA: Insufficient documentation

## 2016-07-12 DIAGNOSIS — I252 Old myocardial infarction: Secondary | ICD-10-CM | POA: Diagnosis not present

## 2016-07-12 DIAGNOSIS — E7800 Pure hypercholesterolemia, unspecified: Secondary | ICD-10-CM | POA: Insufficient documentation

## 2016-07-12 DIAGNOSIS — I10 Essential (primary) hypertension: Secondary | ICD-10-CM | POA: Diagnosis not present

## 2016-07-12 DIAGNOSIS — E1136 Type 2 diabetes mellitus with diabetic cataract: Secondary | ICD-10-CM | POA: Diagnosis not present

## 2016-07-12 DIAGNOSIS — Z955 Presence of coronary angioplasty implant and graft: Secondary | ICD-10-CM | POA: Insufficient documentation

## 2016-07-12 DIAGNOSIS — K219 Gastro-esophageal reflux disease without esophagitis: Secondary | ICD-10-CM | POA: Insufficient documentation

## 2016-07-12 DIAGNOSIS — I2510 Atherosclerotic heart disease of native coronary artery without angina pectoris: Secondary | ICD-10-CM | POA: Insufficient documentation

## 2016-07-12 HISTORY — DX: Cerebral infarction, unspecified: I63.9

## 2016-07-12 HISTORY — DX: Gastro-esophageal reflux disease without esophagitis: K21.9

## 2016-07-12 HISTORY — PX: CATARACT EXTRACTION W/PHACO: SHX586

## 2016-07-12 SURGERY — PHACOEMULSIFICATION, CATARACT, WITH IOL INSERTION
Anesthesia: Monitor Anesthesia Care | Site: Eye | Laterality: Left | Wound class: Clean

## 2016-07-12 MED ORDER — ARMC OPHTHALMIC DILATING GEL
1.0000 "application " | OPHTHALMIC | Status: DC | PRN
  Administered 2016-07-12: 1 via OPHTHALMIC

## 2016-07-12 MED ORDER — POVIDONE-IODINE 5 % OP SOLN
1.0000 "application " | Freq: Once | OPHTHALMIC | Status: AC
  Administered 2016-07-12: 1 via OPHTHALMIC

## 2016-07-12 MED ORDER — TETRACAINE HCL 0.5 % OP SOLN
OPHTHALMIC | Status: AC
  Administered 2016-07-12: 1 [drp] via OPHTHALMIC
  Filled 2016-07-12: qty 2

## 2016-07-12 MED ORDER — SODIUM CHLORIDE 0.9 % IV SOLN
INTRAVENOUS | Status: DC
Start: 2016-07-12 — End: 2016-07-12
  Administered 2016-07-12: 11:00:00 via INTRAVENOUS

## 2016-07-12 MED ORDER — POVIDONE-IODINE 5 % OP SOLN
OPHTHALMIC | Status: AC
  Administered 2016-07-12: 1 via OPHTHALMIC
  Filled 2016-07-12: qty 30

## 2016-07-12 MED ORDER — EPINEPHRINE HCL 1 MG/ML IJ SOLN
INTRAMUSCULAR | Status: AC
  Filled 2016-07-12: qty 2

## 2016-07-12 MED ORDER — MOXIFLOXACIN HCL 0.5 % OP SOLN
OPHTHALMIC | Status: DC | PRN
  Administered 2016-07-12: 1 [drp] via OPHTHALMIC

## 2016-07-12 MED ORDER — TETRACAINE HCL 0.5 % OP SOLN
1.0000 [drp] | Freq: Once | OPHTHALMIC | Status: AC
  Administered 2016-07-12: 1 [drp] via OPHTHALMIC

## 2016-07-12 MED ORDER — LIDOCAINE HCL (PF) 4 % IJ SOLN
INTRAOCULAR | Status: DC | PRN
  Administered 2016-07-12: .5 mL via OPHTHALMIC

## 2016-07-12 MED ORDER — MOXIFLOXACIN HCL 0.5 % OP SOLN
OPHTHALMIC | Status: AC
  Filled 2016-07-12: qty 3

## 2016-07-12 MED ORDER — FENTANYL CITRATE (PF) 100 MCG/2ML IJ SOLN
INTRAMUSCULAR | Status: DC | PRN
  Administered 2016-07-12: 50 ug via INTRAVENOUS

## 2016-07-12 MED ORDER — EPINEPHRINE HCL 1 MG/ML IJ SOLN
INTRAOCULAR | Status: DC | PRN
  Administered 2016-07-12: 1 mL via OPHTHALMIC

## 2016-07-12 MED ORDER — LIDOCAINE HCL (PF) 4 % IJ SOLN
INTRAMUSCULAR | Status: AC
  Filled 2016-07-12: qty 5

## 2016-07-12 MED ORDER — MIDAZOLAM HCL 2 MG/2ML IJ SOLN
INTRAMUSCULAR | Status: DC | PRN
  Administered 2016-07-12: 1 mg via INTRAVENOUS

## 2016-07-12 MED ORDER — MOXIFLOXACIN HCL 0.5 % OP SOLN: 1.0000 [drp] | OPHTHALMIC | Status: DC | PRN

## 2016-07-12 MED ORDER — SODIUM HYALURONATE 10 MG/ML IO SOLN
INTRAOCULAR | Status: DC | PRN
  Administered 2016-07-12: .85 mL via INTRAOCULAR

## 2016-07-12 MED ORDER — ARMC OPHTHALMIC DILATING GEL
OPHTHALMIC | Status: AC
  Administered 2016-07-12: 1 via OPHTHALMIC
  Filled 2016-07-12: qty 0.25

## 2016-07-12 MED ORDER — SODIUM HYALURONATE 23 MG/ML IO SOLN
INTRAOCULAR | Status: DC | PRN
  Administered 2016-07-12: .6 mL via INTRAOCULAR

## 2016-07-12 MED ORDER — SODIUM HYALURONATE 10 MG/ML IO SOLN
INTRAOCULAR | Status: AC
  Filled 2016-07-12: qty 0.85

## 2016-07-12 SURGICAL SUPPLY — 22 items
CANNULA ANT/CHMB 27GA (MISCELLANEOUS) ×6 IMPLANT
CUP MEDICINE 2OZ PLAST GRAD ST (MISCELLANEOUS) ×3 IMPLANT
GLOVE BIO SURGEON STRL SZ8 (GLOVE) ×3 IMPLANT
GLOVE BIOGEL M 6.5 STRL (GLOVE) ×3 IMPLANT
GLOVE SURG LX 7.5 STRW (GLOVE) ×2
GLOVE SURG LX STRL 7.5 STRW (GLOVE) ×1 IMPLANT
GOWN STRL REUS W/ TWL LRG LVL3 (GOWN DISPOSABLE) ×2 IMPLANT
GOWN STRL REUS W/TWL LRG LVL3 (GOWN DISPOSABLE) ×4
LENS IOL ACRSF IQ PC 19.5 (Intraocular Lens) ×1 IMPLANT
LENS IOL ACRYSOF IQ POST 19.5 (Intraocular Lens) ×3 IMPLANT
PACK CATARACT (MISCELLANEOUS) ×3 IMPLANT
PACK CATARACT BRASINGTON LX (MISCELLANEOUS) ×3 IMPLANT
PACK EYE AFTER SURG (MISCELLANEOUS) ×3 IMPLANT
SOL BSS BAG (MISCELLANEOUS) ×3
SOL PREP PVP 2OZ (MISCELLANEOUS) ×3
SOLUTION BSS BAG (MISCELLANEOUS) ×1 IMPLANT
SOLUTION PREP PVP 2OZ (MISCELLANEOUS) ×1 IMPLANT
SYR 3ML LL SCALE MARK (SYRINGE) ×6 IMPLANT
SYR 5ML LL (SYRINGE) ×3 IMPLANT
SYR TB 1ML 27GX1/2 LL (SYRINGE) ×3 IMPLANT
WATER STERILE IRR 250ML POUR (IV SOLUTION) ×3 IMPLANT
WIPE NON LINTING 3.25X3.25 (MISCELLANEOUS) ×3 IMPLANT

## 2016-07-12 NOTE — H&P (Signed)
  The History and Physical notes are on paper, have been signed, and are to be scanned. The patient remains stable and unchanged from the H&P.   Previous H&P reviewed, patient examined, and there are no changes.  Daniel [name]  07/12/2016 10:45 AM

## 2016-07-12 NOTE — Transfer of Care (Signed)
Immediate Anesthesia Transfer of Care Note  Patient: Daniel SmartDavid G [name]  Procedure(s) Performed: Procedure(s) with comments: CATARACT EXTRACTION PHACO AND INTRAOCULAR LENS PLACEMENT (IOC) (Left) - US  00:50 AP% 9.2 CDE 4.63 fluid pack lot # [id number]  Patient Location: PACU  Anesthesia Type:MAC  Level of Consciousness: awake, alert  and oriented  Airway & Oxygen Therapy: Patient Spontanous Breathing  Post-op Assessment: Post -op Vital signs reviewed and stable  Post vital signs: stable  Last Vitals:  Vitals:   07/12/16 0948 07/12/16 1123  BP: (!) 159/77 111/74  Pulse: (!) 58 (!) 55  Resp: 18   Temp: 36.7 C 37.2 C    Last Pain:  Vitals:   07/12/16 1123  TempSrc: Tympanic         Complications: No apparent anesthesia complications

## 2016-07-12 NOTE — Discharge Instructions (Signed)
Eye Surgery Discharge Instructions  Expect mild scratchy sensation or mild soreness. DO NOT RUB YOUR EYE!  The day of surgery:  Minimal physical activity, but bed rest is not required  No reading, computer work, or close hand work  No bending, lifting, or straining.  May watch TV  For 24 hours:  No driving, legal decisions, or alcoholic beverages  Safety precautions  Eat anything you prefer: It is better to start with liquids, then soup then solid foods.  _____ Eye patch should be worn until postoperative exam tomorrow.  ____ Solar shield eyeglasses should be worn for comfort in the sunlight/patch while sleeping  Resume all regular medications including aspirin or Coumadin if these were discontinued prior to surgery. You may shower, bathe, shave, or wash your hair. Tylenol may be taken for mild discomfort.  Call your doctor if you experience significant pain, nausea, or vomiting, fever > 101 or other signs of infection. [phone] or [phone] Specific instructions:  Follow-up Information    Willey BladeBradley King, MD Follow up on 07/13/2016.   Specialty:  Ophthalmology Why:  AT 9:50 Contact information: [address] [place] [address] [phone]

## 2016-07-12 NOTE — Anesthesia Preprocedure Evaluation (Signed)
Anesthesia Evaluation  Patient identified by MRN, date of birth, ID band Patient awake    Reviewed: Allergy & Precautions, H&P , NPO status , Patient's Chart, lab work & pertinent test results, reviewed documented beta blocker date and time   History of Anesthesia Complications Negative for: history of anesthetic complications  Airway Mallampati: III  TM Distance: >3 FB Neck ROM: full    Dental no notable dental hx. (+) Caps, Missing, Poor Dentition   Pulmonary neg pulmonary ROS, former smoker,    Pulmonary exam normal breath sounds clear to auscultation       Cardiovascular Exercise Tolerance: Good hypertension, (-) angina+ CAD, + Past MI and + Cardiac Stents (placed in May of 2017)  (-) CABG Normal cardiovascular exam(-) dysrhythmias (-) Valvular Problems/Murmurs Rhythm:regular Rate:Normal     Neuro/Psych neg Seizures CVA, Residual Symptoms negative psych ROS   GI/Hepatic Neg liver ROS, GERD  ,  Endo/Other  diabetes, Well Controlled  Renal/GU negative Renal ROS  negative genitourinary   Musculoskeletal   Abdominal   Peds  Hematology negative hematology ROS (+)   Anesthesia Other Findings Past Medical History: No date: CAD (coronary artery disease)     Comment: a. NSTEMI: 100% stenosis of the distal RCA -->              DES placed 5/17 CLEARED BY CARDIOLOGIST No date: Chronic low back pain     Comment: a. s/p back surgery. No date: CVA (cerebral infarction)     Comment: a. 02/2015 dyarthria 2/2 CVA involving the               lateral aspect of the precentral gyrus. No date: DM2 (diabetes mellitus, type 2) (HCC)     Comment: a. pre-diabetic in the past. b. A1c 03/2016               elevated to 7.9. No date: ETOH abuse No date: GERD (gastroesophageal reflux disease) No date: History of echocardiogram     Comment: a. Mild LVH, EF 55-60%, Definity contrast               used-LV wall motion could not be  adequately               assessed 03/2016: History of non-ST elevation myocardial infarct*     Comment: a. PCI: 3.5 x 24 mm Promus Premier DES to               distal RCA No date: History of tobacco abuse No date: Hypertension     Comment: a. 02/2015 echo: 55-65%, trace TR/MR. b.               03/2016: echo with EF of 55-60%.  No date: Obesity No date: Ruptured disk No date: Stroke [facility](HCC)     Comment: 2016   Reproductive/Obstetrics negative OB ROS                             Anesthesia Physical Anesthesia Plan  ASA: II  Anesthesia Plan: MAC   Post-op Pain Management:    Induction:   Airway Management Planned:   Additional Equipment:   Intra-op Plan:   Post-operative Plan:   Informed Consent: I have reviewed the patients History and Physical, chart, labs and discussed the procedure including the risks, benefits and alternatives for the proposed anesthesia with the patient or authorized representative who has indicated his/her understanding and acceptance.   Dental Advisory  Given  Plan Discussed with: Anesthesiologist, CRNA and Surgeon  Anesthesia Plan Comments:         Anesthesia Quick Evaluation

## 2016-07-12 NOTE — Telephone Encounter (Signed)
Per call from patients wife, the rx for brilinta will cost $1000.00 even with a savings card that the pharmacu had, and they cannot afford that. Wife will call the pharmacy to see if it needs a prior authorization and have them fax the form to the office.

## 2016-07-12 NOTE — Anesthesia Postprocedure Evaluation (Signed)
Anesthesia Post Note  Patient: Daniel SmartDavid G Zody  Procedure(s) Performed: Procedure(s) (LRB): CATARACT EXTRACTION PHACO AND INTRAOCULAR LENS PLACEMENT (IOC) (Left)  Patient location during evaluation: PACU Anesthesia Type: MAC Level of consciousness: awake and alert and oriented Pain management: pain level controlled Vital Signs Assessment: post-procedure vital signs reviewed and stable Respiratory status: spontaneous breathing Cardiovascular status: stable Anesthetic complications: no    Last Vitals:  Vitals:   07/12/16 0948 07/12/16 1123  BP: (!) 159/77 111/74  Pulse: (!) 58 (!) 56  Resp: 18 16  Temp: 36.7 C 37.2 C    Last Pain:  Vitals:   07/12/16 1123  TempSrc: Tympanic                 ,  Alessandra BevelsJennifer M

## 2016-07-12 NOTE — Op Note (Signed)
OPERATIVE NOTE  Daniel CharonDavid G Barrilleaux [id number] 07/12/2016   PREOPERATIVE DIAGNOSIS:  Nuclear sclerotic cataract left eye.  H25.12   POSTOPERATIVE DIAGNOSIS:    Nuclear sclerotic cataract left eye.     PROCEDURE:  Phacoemusification with posterior chamber intraocular lens placement of the left eye   LENS:   Implant Name Type Inv. Item Serial No. Manufacturer Lot No. LRB No. Used  IMPLANT LENS - [id number] Intraocular Lens IMPLANT LENS [id number] ALCON   Left 1       SN60WF 19.5   ULTRASOUND TIME: 0 minutes 50 seconds.  CDE 4.63   SURGEON:  Willey BladeBradley King, MD, MPH   ANESTHESIA:  Topical with tetracaine drops and 2% Xylocaine jelly, augmented with 1% preservative-free intracameral lidocaine.   COMPLICATIONS:  None.   DESCRIPTION OF PROCEDURE:  The patient was identified in the holding room and transported to the operating room and placed in the supine position under the operating microscope.  The left eye was identified as the operative eye and it was prepped and draped in the usual sterile ophthalmic fashion.   A 1.0 millimeter clear-corneal paracentesis was made at the 5:00 position. 0.5 ml of preservative-free 1% lidocaine with epinephrine was injected into the anterior chamber.  The anterior chamber was filled with Healon 5 viscoelastic.  A 2.4 millimeter keratome was used to make a near-clear corneal incision at the 2:00 position.  A curvilinear capsulorrhexis was made with a cystotome and capsulorrhexis forceps.  Balanced salt solution was used to hydrodissect and hydrodelineate the nucleus.   Phacoemulsification was then used in stop and chop fashion to remove the lens nucleus and epinucleus.  The lens was soft.  The remaining cortex was then removed using the irrigation and aspiration handpiece. Healon was then placed into the capsular bag to distend it for lens placement.  A lens was then injected into the capsular bag.  The remaining viscoelastic was aspirated.   Wounds were  hydrated with balanced salt solution.  The anterior chamber was inflated to a physiologic pressure with balanced salt solution.  Intracameral vigamox 0.1 mL undiltued was injected into the eye.  No wound leaks were noted.  Topical Vigamox drops were applied to the eye.  The patient was taken to the recovery room in stable condition without complications of anesthesia or surgery  Willey BladeBradley King 07/12/2016, 11:21 AM

## 2016-07-16 ENCOUNTER — Other Ambulatory Visit (INDEPENDENT_AMBULATORY_CARE_PROVIDER_SITE_OTHER): Payer: PRIVATE HEALTH INSURANCE

## 2016-07-16 DIAGNOSIS — E119 Type 2 diabetes mellitus without complications: Secondary | ICD-10-CM | POA: Diagnosis not present

## 2016-07-16 DIAGNOSIS — E785 Hyperlipidemia, unspecified: Secondary | ICD-10-CM

## 2016-07-16 DIAGNOSIS — R7401 Elevation of levels of liver transaminase levels: Secondary | ICD-10-CM

## 2016-07-16 DIAGNOSIS — R740 Nonspecific elevation of levels of transaminase and lactic acid dehydrogenase [LDH]: Secondary | ICD-10-CM | POA: Diagnosis not present

## 2016-07-16 LAB — HEPATIC FUNCTION PANEL
ALBUMIN: 4.1 g/dL (ref 3.5–5.2)
ALK PHOS: 107 U/L (ref 39–117)
ALT: 49 U/L (ref 0–53)
AST: 27 U/L (ref 0–37)
Bilirubin, Direct: 0.2 mg/dL (ref 0.0–0.3)
TOTAL PROTEIN: 6.9 g/dL (ref 6.0–8.3)
Total Bilirubin: 0.7 mg/dL (ref 0.2–1.2)

## 2016-07-16 LAB — HEMOGLOBIN A1C: HEMOGLOBIN A1C: 5.8 % (ref 4.6–6.5)

## 2016-07-17 LAB — HEPATITIS C ANTIBODY: HCV AB: NEGATIVE

## 2016-07-17 NOTE — Telephone Encounter (Signed)
Left message on patient's cell phone to call us back.

## 2016-07-20 ENCOUNTER — Ambulatory Visit: Payer: PRIVATE HEALTH INSURANCE | Admitting: Family Medicine

## 2016-07-23 ENCOUNTER — Encounter: Payer: Self-pay | Admitting: Family Medicine

## 2016-07-23 ENCOUNTER — Ambulatory Visit (INDEPENDENT_AMBULATORY_CARE_PROVIDER_SITE_OTHER): Payer: PRIVATE HEALTH INSURANCE | Admitting: Family Medicine

## 2016-07-23 VITALS — BP 122/68 | HR 65 | Temp 98.2°F | Ht 70.0 in | Wt 268.5 lb

## 2016-07-23 DIAGNOSIS — R7401 Elevation of levels of liver transaminase levels: Secondary | ICD-10-CM

## 2016-07-23 DIAGNOSIS — R740 Nonspecific elevation of levels of transaminase and lactic acid dehydrogenase [LDH]: Secondary | ICD-10-CM | POA: Diagnosis not present

## 2016-07-23 DIAGNOSIS — R739 Hyperglycemia, unspecified: Secondary | ICD-10-CM

## 2016-07-23 DIAGNOSIS — I10 Essential (primary) hypertension: Secondary | ICD-10-CM

## 2016-07-23 NOTE — Assessment & Plan Note (Signed)
bp in fair control at this time  BP Readings from Last 1 Encounters:  07/23/16 122/68   No changes needed Disc lifstyle change with low sodium diet and exercise  Enc further good lifestyle habits and wt loss

## 2016-07-23 NOTE — Progress Notes (Signed)
Subjective:    Patient ID: Daniel [name], male    DOB: June 20, 1966, 50 y.o.   MRN: [id number]  HPI Here for f/u of chronic medical problems   Wt Readings from Last 3 Encounters:  07/23/16 268 lb 8 oz (121.8 kg)  07/12/16 270 lb (122.5 kg)  06/21/16 270 lb 6.4 oz (122.7 kg)  bmi is 38.5  bp is stable today  No cp or palpitations or headaches or edema  No side effects to medicines  BP Readings from Last 3 Encounters:  07/23/16 122/68  07/12/16 120/66  06/21/16 132/70     DM2 Lab Results  Component Value Date   HGBA1C 5.8 07/16/2016   This is down from 7.9- great improvement! Has made a lot of improvement   Got a treadmill - likes it better than the elliptical  3 miles now -working up to 5 miles  Every day  No CP or sob   Eating better - has given up salty foods and soft drinks - big difference  Cut out all cokes  Cut out red meat -eating chicken and fish  Staying away from fried foods   Starting to notice a difference in how he feels - endurance is gradually returning   Liver tests  Lab Results  Component Value Date   ALT 49 07/16/2016   AST 27 07/16/2016   ALKPHOS 107 07/16/2016   BILITOT 0.7 07/16/2016   also did cut down on beer  He drinks light beer if any - only a few on the weekends   Non smoker   Eating regular meals   Patient Active Problem List   Diagnosis Date Noted  . Hyperglycemia 04/16/2016  . Elevated transaminase level 04/16/2016  . Coronary artery disease involving native coronary artery of native heart without angina pectoris 03/20/2016  . History of non-ST elevation myocardial infarction (NSTEMI) 03/20/2016  . Dyslipidemia 03/12/2016  . NSTEMI (non-ST elevated myocardial infarction) (HCC) 03/10/2016  . Constipation 02/10/2016  . Prostate cancer screening 02/10/2016  . CVA (cerebral infarction) 03/01/2015  . Right knee pain 04/06/2014  . Benign paroxysmal positional vertigo 03/10/2014  . BPH (benign prostatic hyperplasia)  11/16/2013  . Routine general medical examination at a health care facility 11/12/2013  . Morbid obesity (HCC) 03/02/2011  . VITAMIN B12 DEFICIENCY 09/29/2007  . ERECTILE DYSFUNCTION 07/01/2007  . History of tobacco abuse 07/01/2007  . LOW BACK PAIN, CHRONIC 07/01/2007  . HYPERTENSION, BENIGN ESSENTIAL 06/03/2007   Past Medical History:  Diagnosis Date  . CAD (coronary artery disease)    a. NSTEMI: 100% stenosis of the distal RCA --> DES placed 5/17 CLEARED BY CARDIOLOGIST  . Chronic low back pain    a. s/p back surgery.  . CVA (cerebral infarction)    a. 02/2015 dyarthria 2/2 CVA involving the lateral aspect of the precentral gyrus.  . DM2 (diabetes mellitus, type 2) (HCC)    a. pre-diabetic in the past. b. A1c 03/2016 elevated to 7.9.  . ETOH abuse   . GERD (gastroesophageal reflux disease)   . History of echocardiogram    a. Mild LVH, EF 55-60%, Definity contrast used-LV wall motion could not be adequately assessed  . History of non-ST elevation myocardial infarction (NSTEMI) 03/2016   a. PCI: 3.5 x 24 mm Promus Premier DES to distal RCA  . History of tobacco abuse   . Hypertension    a. 02/2015 echo: 55-65%, trace TR/MR. b. 03/2016: echo with EF of 55-60%.   . Obesity   .  Ruptured disk   . Stroke [facility]HCC)    2016   Past Surgical History:  Procedure Laterality Date  . BACK SURGERY     ruptured disk, L-S  . CARDIAC CATHETERIZATION  05/2004   minimal CAD  . CARDIAC CATHETERIZATION N/A 03/10/2016   Procedure: Left Heart Cath and Coronary Angiography;  Surgeon: Kathleene Hazelhristopher D McAlhany, MD;  [facility];  Service: Cardiovascular;  Laterality: N/A;  . CARDIAC CATHETERIZATION N/A 03/10/2016   Procedure: Coronary Stent Intervention;  Surgeon: Kathleene Hazelhristopher D McAlhany, MD;  Location: [facility] INVASIVE CV LAB;  Service: Cardiovascular;  Laterality: N/A;  . CATARACT EXTRACTION W/PHACO Left 07/12/2016   Procedure: CATARACT EXTRACTION PHACO AND INTRAOCULAR LENS PLACEMENT (IOC);  Surgeon:  Nevada CraneBradley Mark King, MD;  Location: [facility] ORS;  Service: Ophthalmology;  Laterality: Left;  US  00:50 AP% 9.2 CDE 4.63 fluid pack lot # [id number]  . CORONARY ANGIOPLASTY     STENT 5/17   Social History  Substance Use Topics  . Smoking status: Former Smoker    Types: Cigarettes    Quit date: 01/18/2015  . Smokeless tobacco: Never Used  . Alcohol use 0.0 oz/week     Comment: about 3 12 packs/wk.   Family History  Problem Relation Age of Onset  . Hypertension Father   . Heart disease Father     MI  . Cancer Maternal Grandmother     lung  . Heart failure Paternal Grandfather    Allergies  Allergen Reactions  . Ace Inhibitors     REACTION: cough   Current Outpatient Prescriptions on File Prior to Visit  Medication Sig Dispense Refill  . amLODipine (NORVASC) 5 MG tablet TAKE 1 TABLET (5 MG TOTAL) BY MOUTH DAILY. 30 tablet 11  . aspirin 81 MG tablet Take 81 mg by mouth daily.    Marland Kitchen. atorvastatin (LIPITOR) 80 MG tablet Take 1 tablet (80 mg total) by mouth daily at 6 PM. 30 tablet 6  . BRILINTA 90 MG TABS tablet TAKE 1 TABLET BY MOUTH TWICE A DAY 180 tablet 1  . carvedilol (COREG) 6.25 MG tablet Take 1 tablet (6.25 mg total) by mouth 2 (two) times daily with a meal. 60 tablet 6  . hydrochlorothiazide (HYDRODIURIL) 25 MG tablet TAKE 1/2 TABLET BY MOUTH ONCE DAILY  11  . losartan (COZAAR) 100 MG tablet TAKE 1 TABLET (100 MG TOTAL) BY MOUTH DAILY. 30 tablet 11  . nitroGLYCERIN (NITROSTAT) 0.4 MG SL tablet Place 1 tablet (0.4 mg total) under the tongue every 5 (five) minutes as needed for chest pain (CP or SOB). 25 tablet 3  . ranitidine (ZANTAC) 150 MG tablet Take 150 mg by mouth 2 (two) times daily as needed for heartburn.     . sildenafil (VIAGRA) 50 MG tablet Take 1 tablet (50 mg total) by mouth daily as needed. Take one by mouth once daily as directed as needed 30 minutes before sexual activity. 10 tablet 11   No current facility-administered medications on file prior to visit.       Review of Systems Review of Systems  Constitutional: Negative for fever, appetite change, fatigue and unexpected weight change.  Eyes: Negative for pain and visual disturbance.  Respiratory: Negative for cough and shortness of breath.   Cardiovascular: Negative for cp or palpitations    Gastrointestinal: Negative for nausea, diarrhea and constipation.  Genitourinary: Negative for urgency and frequency.  Skin: Negative for pallor or rash   Neurological: Negative for weakness, light-headedness, numbness and headaches.  Hematological: Negative for adenopathy. Does not bruise/bleed easily.  Psychiatric/Behavioral: Negative for dysphoric mood. The patient is not nervous/anxious.         Objective:   Physical Exam  Constitutional: He appears well-developed and well-nourished. No distress.  obese and well appearing   HENT:  Head: Normocephalic and atraumatic.  Mouth/Throat: Oropharynx is clear and moist.  Eyes: Conjunctivae and EOM are normal. Pupils are equal, round, and reactive to light.  Neck: Normal range of motion. Neck supple. No JVD present. Carotid bruit is not present. No thyromegaly present.  Cardiovascular: Normal rate, regular rhythm, normal heart sounds and intact distal pulses.  Exam reveals no gallop.   Pulmonary/Chest: Effort normal and breath sounds normal. No respiratory distress. He has no wheezes. He has no rales.  No crackles  Abdominal: Soft. Bowel sounds are normal. He exhibits no distension, no abdominal bruit and no mass. There is no tenderness.  No HSM  Musculoskeletal: He exhibits no edema.  Lymphadenopathy:    He has no cervical adenopathy.  Neurological: He is alert. He has normal reflexes. No cranial nerve deficit. He exhibits normal muscle tone. Coordination normal.  Skin: Skin is warm and dry. No rash noted.  Psychiatric: He has a normal mood and affect.  Pleasant and talkative           Assessment & Plan:   Problem List Items Addressed This  Visit      Cardiovascular and Mediastinum   HYPERTENSION, BENIGN ESSENTIAL - Primary    bp in fair control at this time  BP Readings from Last 1 Encounters:  07/23/16 122/68   No changes needed Disc lifstyle change with low sodium diet and exercise  Enc further good lifestyle habits and wt loss         Other   Hyperglycemia    Lab Results  Component Value Date   HGBA1C 5.8 07/16/2016   This is down from 7.9  Excellent progress with diet and exercise alone Commended Enc to keep up the good work F/u 6 mo lab prior        Elevated transaminase level    Normalized with better/lower fat diet and also cutting back on etoh  Enc to keep up the good work  Continue to follow        Other Visit Diagnoses   None.

## 2016-07-23 NOTE — Assessment & Plan Note (Signed)
Normalized with better/lower fat diet and also cutting back on etoh  Enc to keep up the good work  Continue to follow

## 2016-07-23 NOTE — Patient Instructions (Addendum)
Glucose control is much better  You are in the hyperglycemic range-not longer the diabetic range Keep working on weight loss - let's keep away diabetes  Keep doing what you are doing   Follow up in 6 month with labs prior

## 2016-07-23 NOTE — Progress Notes (Signed)
Pre visit review using our clinic review tool, if applicable. No additional management support is needed unless otherwise documented below in the visit note. 

## 2016-07-23 NOTE — Assessment & Plan Note (Signed)
Lab Results  Component Value Date   HGBA1C 5.8 07/16/2016   This is down from 7.9  Excellent progress with diet and exercise alone Commended Enc to keep up the good work F/u 6 mo lab prior

## 2016-07-23 NOTE — Progress Notes (Signed)
   Subjective:    Patient ID: Daniel [name], male    DOB: 19-Aug-1966, 50 y.o.   MRN: [id number]  HPI Here for f/u of chronic medical problems   Wt Readings from Last 3 Encounters:  07/23/16 268 lb 8 oz (121.8 kg)  07/12/16 270 lb (122.5 kg)  06/21/16 270 lb 6.4 oz (122.7 kg)   bmi is 38.5  bp is stable today  No cp or palpitations or headaches or edema  No side effects to medicines  BP Readings from Last 3 Encounters:  07/23/16 122/68  07/12/16 120/66  06/21/16 132/70     Last visit A1C was high at 7.9 Now much better  Lab Results  Component Value Date   HGBA1C 5.8 07/16/2016       Review of Systems     Objective:   Physical Exam        Assessment & Plan:

## 2016-10-08 ENCOUNTER — Other Ambulatory Visit: Payer: Self-pay | Admitting: Student

## 2017-01-09 ENCOUNTER — Other Ambulatory Visit: Payer: Self-pay | Admitting: Cardiovascular Disease

## 2017-01-09 DIAGNOSIS — I2510 Atherosclerotic heart disease of native coronary artery without angina pectoris: Secondary | ICD-10-CM

## 2017-01-09 DIAGNOSIS — I252 Old myocardial infarction: Secondary | ICD-10-CM

## 2017-01-13 ENCOUNTER — Telehealth: Payer: Self-pay | Admitting: Family Medicine

## 2017-01-13 DIAGNOSIS — R739 Hyperglycemia, unspecified: Secondary | ICD-10-CM

## 2017-01-13 DIAGNOSIS — E785 Hyperlipidemia, unspecified: Secondary | ICD-10-CM

## 2017-01-13 DIAGNOSIS — I10 Essential (primary) hypertension: Secondary | ICD-10-CM

## 2017-01-13 DIAGNOSIS — R7401 Elevation of levels of liver transaminase levels: Secondary | ICD-10-CM

## 2017-01-13 DIAGNOSIS — R740 Nonspecific elevation of levels of transaminase and lactic acid dehydrogenase [LDH]: Secondary | ICD-10-CM

## 2017-01-13 NOTE — Telephone Encounter (Signed)
-----   Message from Alvina Chouerri J Walsh sent at 01/02/2017  4:43 PM EST ----- Regarding: Lab orders for Thursday, 3.15.18 Lab orders for a 6 month follow up appt

## 2017-01-14 NOTE — Progress Notes (Signed)
Cardiology Office Note   Date:  01/15/2017   ID:  Daniel CharonDavid G Garriga, DOB [birth year], MRN [id number]  PCP:  Roxy MannsMarne Tower, MD  Cardiologist:  Dr. Clifton JamesMcAlhany     Chief Complaint  Patient presents with  . Coronary Artery Disease      History of Present Illness: Daniel [name] is a 51 y.o. male who presents for CAD, DM, HTN, prior CVA.  Hx of NSTemi 03/2016.  DES to occluded RCA -normal LV function.    Today he has no complaints.  No chest pain, he still has occ SOB but is the same it has been.  No palpitations.  He would like to come off Brilinta due to cost $250 per month.  Unfortunately his wt has increased and he is not exercising.  We discussed and he does plan to get back to it.      Past Medical History:  Diagnosis Date  . CAD (coronary artery disease)    a. NSTEMI: 100% stenosis of the distal RCA --> DES placed 5/17 CLEARED BY CARDIOLOGIST  . Chronic low back pain    a. s/p back surgery.  . CVA (cerebral infarction)    a. 02/2015 dyarthria 2/2 CVA involving the lateral aspect of the precentral gyrus.  . DM2 (diabetes mellitus, type 2) (HCC)    a. pre-diabetic in the past. b. A1c 03/2016 elevated to 7.9.  . ETOH abuse   . GERD (gastroesophageal reflux disease)   . History of echocardiogram    a. Mild LVH, EF 55-60%, Definity contrast used-LV wall motion could not be adequately assessed  . History of non-ST elevation myocardial infarction (NSTEMI) 03/2016   a. PCI: 3.5 x 24 mm Promus Premier DES to distal RCA  . History of tobacco abuse   . Hypertension    a. 02/2015 echo: 55-65%, trace TR/MR. b. 03/2016: echo with EF of 55-60%.   . Obesity   . Ruptured disk   . Stroke [facility](HCC)    2016    Past Surgical History:  Procedure Laterality Date  . BACK SURGERY     ruptured disk, L-S  . CARDIAC CATHETERIZATION  05/2004   minimal CAD  . CARDIAC CATHETERIZATION N/A 03/10/2016   Procedure: Left Heart Cath and Coronary Angiography;  Surgeon: Kathleene Hazelhristopher D McAlhany, MD;  [facility] LAB;  Service: Cardiovascular;  Laterality: N/A;  . CARDIAC CATHETERIZATION N/A 03/10/2016   Procedure: Coronary Stent Intervention;  Surgeon: Kathleene Hazelhristopher D McAlhany, MD;  Location: [facility] INVASIVE CV LAB;  Service: Cardiovascular;  Laterality: N/A;  . CATARACT EXTRACTION W/PHACO Left 07/12/2016   Procedure: CATARACT EXTRACTION PHACO AND INTRAOCULAR LENS PLACEMENT (IOC);  Surgeon: Nevada CraneBradley Mark King, MD;  Location: [facility] ORS;  Service: Ophthalmology;  Laterality: Left;  US  00:50 AP% 9.2 CDE 4.63 fluid pack lot # [id number]  . CORONARY ANGIOPLASTY     STENT 5/17     Current Outpatient Prescriptions  Medication Sig Dispense Refill  . amLODipine (NORVASC) 5 MG tablet TAKE 1 TABLET (5 MG TOTAL) BY MOUTH DAILY. 30 tablet 11  . aspirin 81 MG tablet Take 81 mg by mouth daily.    Marland Kitchen. atorvastatin (LIPITOR) 80 MG tablet TAKE 1 TABLET (80 MG TOTAL) BY MOUTH DAILY AT 6 PM. 30 tablet 7  . BRILINTA 90 MG TABS tablet TAKE 1 TABLET BY MOUTH TWICE A DAY 180 tablet 0  . carvedilol (COREG) 6.25 MG tablet TAKE 1 TABLET (6.25 MG TOTAL) BY MOUTH 2 (TWO) TIMES DAILY WITH A  MEAL. 60 tablet 7  . hydrochlorothiazide (HYDRODIURIL) 25 MG tablet TAKE 1/2 TABLET BY MOUTH ONCE DAILY  11  . losartan (COZAAR) 100 MG tablet TAKE 1 TABLET (100 MG TOTAL) BY MOUTH DAILY. 30 tablet 11  . nitroGLYCERIN (NITROSTAT) 0.4 MG SL tablet Place 1 tablet (0.4 mg total) under the tongue every 5 (five) minutes as needed for chest pain (CP or SOB). 25 tablet 3  . ranitidine (ZANTAC) 150 MG tablet Take 150 mg by mouth 2 (two) times daily as needed for heartburn.     . sildenafil (VIAGRA) 50 MG tablet Take 1 tablet (50 mg total) by mouth daily as needed. Take one by mouth once daily as directed as needed 30 minutes before sexual activity. 10 tablet 11   No current facility-administered medications for this visit.     Allergies:   Ace inhibitors    Social History:  The patient  reports that he quit smoking about 1 years ago. His smoking  use included Cigarettes. He has never used smokeless tobacco. He reports that he drinks alcohol. He reports that he does not use drugs.   Family History:  The patient's family history includes Cancer in his maternal grandmother; Heart disease in his father; Heart failure in his paternal grandfather; Hypertension in his father.    ROS:  General:no colds or fevers, + weight increase Skin:no rashes or ulcers HEENT:no blurred vision, no congestion CV:see HPI PUL:see HPI GI:no diarrhea constipation or melena, no indigestion GU:no hematuria, no dysuria MS:no joint pain, no claudication Neuro:no syncope, no lightheadedness Endo:no diabetes, no thyroid disease  Wt Readings from Last 3 Encounters:  01/15/17 286 lb 1.9 oz (129.8 kg)  07/23/16 268 lb 8 oz (121.8 kg)  07/12/16 270 lb (122.5 kg)     PHYSICAL EXAM: VS:  BP 130/82   Pulse 64   Ht 5\' 11"  (1.803 m)   Wt 286 lb 1.9 oz (129.8 kg)   BMI 39.91 kg/m  , BMI Body mass index is 39.91 kg/m. General:Pleasant affect, NAD Skin:Warm and dry, brisk capillary refill HEENT:normocephalic, sclera clear, mucus membranes moist Neck:supple, no JVD, no bruits  Heart:S1S2 RRR without murmur, gallup, rub or click Lungs:clear without rales, rhonchi, or wheezes ZOX:WRUEA, soft, non tender, + BS, do not palpate liver spleen or masses Ext:no lower ext edema, 2+ pedal pulses, 2+ radial pulses Neuro:alert and oriented X 3, MAE, follows commands, + facial symmetry    EKG:  EKG is ordered today. The ekg ordered today demonstrates SR old Inf MI.  No acute changes.   Recent Labs: 04/13/2016: BUN 26; Creatinine, Ser 1.26; Hemoglobin 15.7; Platelets 157.0; Potassium 4.5; Sodium 140; TSH 2.38 07/16/2016: ALT 49    Lipid Panel    Component Value Date/Time   CHOL 89 04/13/2016 1058   CHOL 150 02/27/2015 0645   TRIG 57.0 04/13/2016 1058   TRIG 98 02/27/2015 0645   HDL 33.50 (L) 04/13/2016 1058   HDL 34 (L) 02/27/2015 0645   CHOLHDL 3 04/13/2016 1058    VLDL 11.4 04/13/2016 1058   VLDL 20 02/27/2015 0645   LDLCALC 44 04/13/2016 1058   LDLCALC 96 02/27/2015 0645       Other studies Reviewed: Additional studies/ records that were reviewed today include:. TTE 03/2016 Study Conclusions  - Procedure narrative: Transthoracic echocardiography. Image   quality was adequate. The study was technically difficult, as a   result of poor sound wave transmission. Intravenous contrast   (Definity) was administered. - Left ventricle: The cavity size  was mildly dilated. Wall   thickness was increased in a pattern of mild LVH. Systolic   function was normal. The estimated ejection fraction was in the   range of 55% to 60%. Images were inadequate for LV wall motion   assessment. The study is not technically sufficient to allow   evaluation of LV diastolic function. - Left atrium: The atrium was normal in size. - Inferior vena cava: The vessel was normal in size. The   respirophasic diameter changes were in the normal range (>= 50%),   consistent with normal central venous pressure.  Impressions:  - Technically difficult study with poor echo windows. Definity   contrast was minimally helpful. There appears to be grossly   normal LV systolic function, however, I cannot fully evaluate   wall motion.  ASSESSMENT AND PLAN:  1.  CAD with hx of MI and stent to RCA in 03/2016  Has been on Brilinta since May but can no longer afford.  Discussed with Dr. Eldridge Dace and will switch to plavix with 300 mg load dose then 75 mg daily.  In 2 weeks will check P2Y12.   He will follow up with Dr. Clifton James in 6 months or earlier if any problems.  2. HLD  Well controlled in June though HDL low at 33.  Followed by PCP    Current medicines are reviewed with the patient today.  The patient Has no concerns regarding medicines.  The following changes have been made:  See above Labs/ tests ordered today include:see above  Disposition:   FU:  see  above  Signed, Nada Boozer, NP  01/15/2017 11:50 AM    [facility] [address] [place], [place], [place]  [address]/ [address] [place], [place] Phone: [phone]; Fax: [phone]  [phone]

## 2017-01-15 ENCOUNTER — Encounter (INDEPENDENT_AMBULATORY_CARE_PROVIDER_SITE_OTHER): Payer: Self-pay

## 2017-01-15 ENCOUNTER — Ambulatory Visit (INDEPENDENT_AMBULATORY_CARE_PROVIDER_SITE_OTHER): Payer: Self-pay | Admitting: Cardiology

## 2017-01-15 ENCOUNTER — Ambulatory Visit: Payer: PRIVATE HEALTH INSURANCE | Admitting: Cardiovascular Disease

## 2017-01-15 ENCOUNTER — Encounter: Payer: Self-pay | Admitting: Cardiology

## 2017-01-15 VITALS — BP 130/82 | HR 64 | Ht 71.0 in | Wt 286.1 lb

## 2017-01-15 DIAGNOSIS — E782 Mixed hyperlipidemia: Secondary | ICD-10-CM

## 2017-01-15 DIAGNOSIS — I2510 Atherosclerotic heart disease of native coronary artery without angina pectoris: Secondary | ICD-10-CM | POA: Diagnosis not present

## 2017-01-15 DIAGNOSIS — I10 Essential (primary) hypertension: Secondary | ICD-10-CM | POA: Diagnosis not present

## 2017-01-15 DIAGNOSIS — Z79899 Other long term (current) drug therapy: Secondary | ICD-10-CM | POA: Diagnosis not present

## 2017-01-15 MED ORDER — CLOPIDOGREL BISULFATE 75 MG PO TABS: 75.0000 mg | ORAL_TABLET | Freq: Every day | ORAL | 11 refills | Status: DC

## 2017-01-15 NOTE — Patient Instructions (Addendum)
Nada BoozerLaura Ingold, NP, has recommended making the following medication changes: 1. STOP Brilinta 2. START Clopidogrel 75 mg  >>On the first day, take 4 tablets (300 mg total)  >>Then take 1 tablet daily - starting on the second day  Your physician recommends that you return for lab work in 2 weeks at [facility].  Your physician recommends that you schedule a follow-up appointment in 6 months with Dr Clifton JamesMcAlhany. You will receive a reminder letter in the mail two months in advance. If you don't receive a letter, please call our office to schedule the follow-up appointment.  If you need a refill on your cardiac medications before your next appointment, please call your pharmacy.

## 2017-01-17 ENCOUNTER — Other Ambulatory Visit: Payer: PRIVATE HEALTH INSURANCE

## 2017-01-21 ENCOUNTER — Ambulatory Visit: Payer: PRIVATE HEALTH INSURANCE | Admitting: Family Medicine

## 2017-02-06 ENCOUNTER — Other Ambulatory Visit: Payer: Self-pay | Admitting: Family Medicine

## 2017-03-10 ENCOUNTER — Other Ambulatory Visit: Payer: Self-pay | Admitting: Family Medicine

## 2017-05-09 IMAGING — DX DG CHEST 2V
2 series · 2 of 2 positions shown · non-contrast
Comparison: None.

CLINICAL DATA: Left-sided chest pain began last night. Pain
radiates down the left arm.

EXAM:
CHEST  2 VIEW

[chest lat]
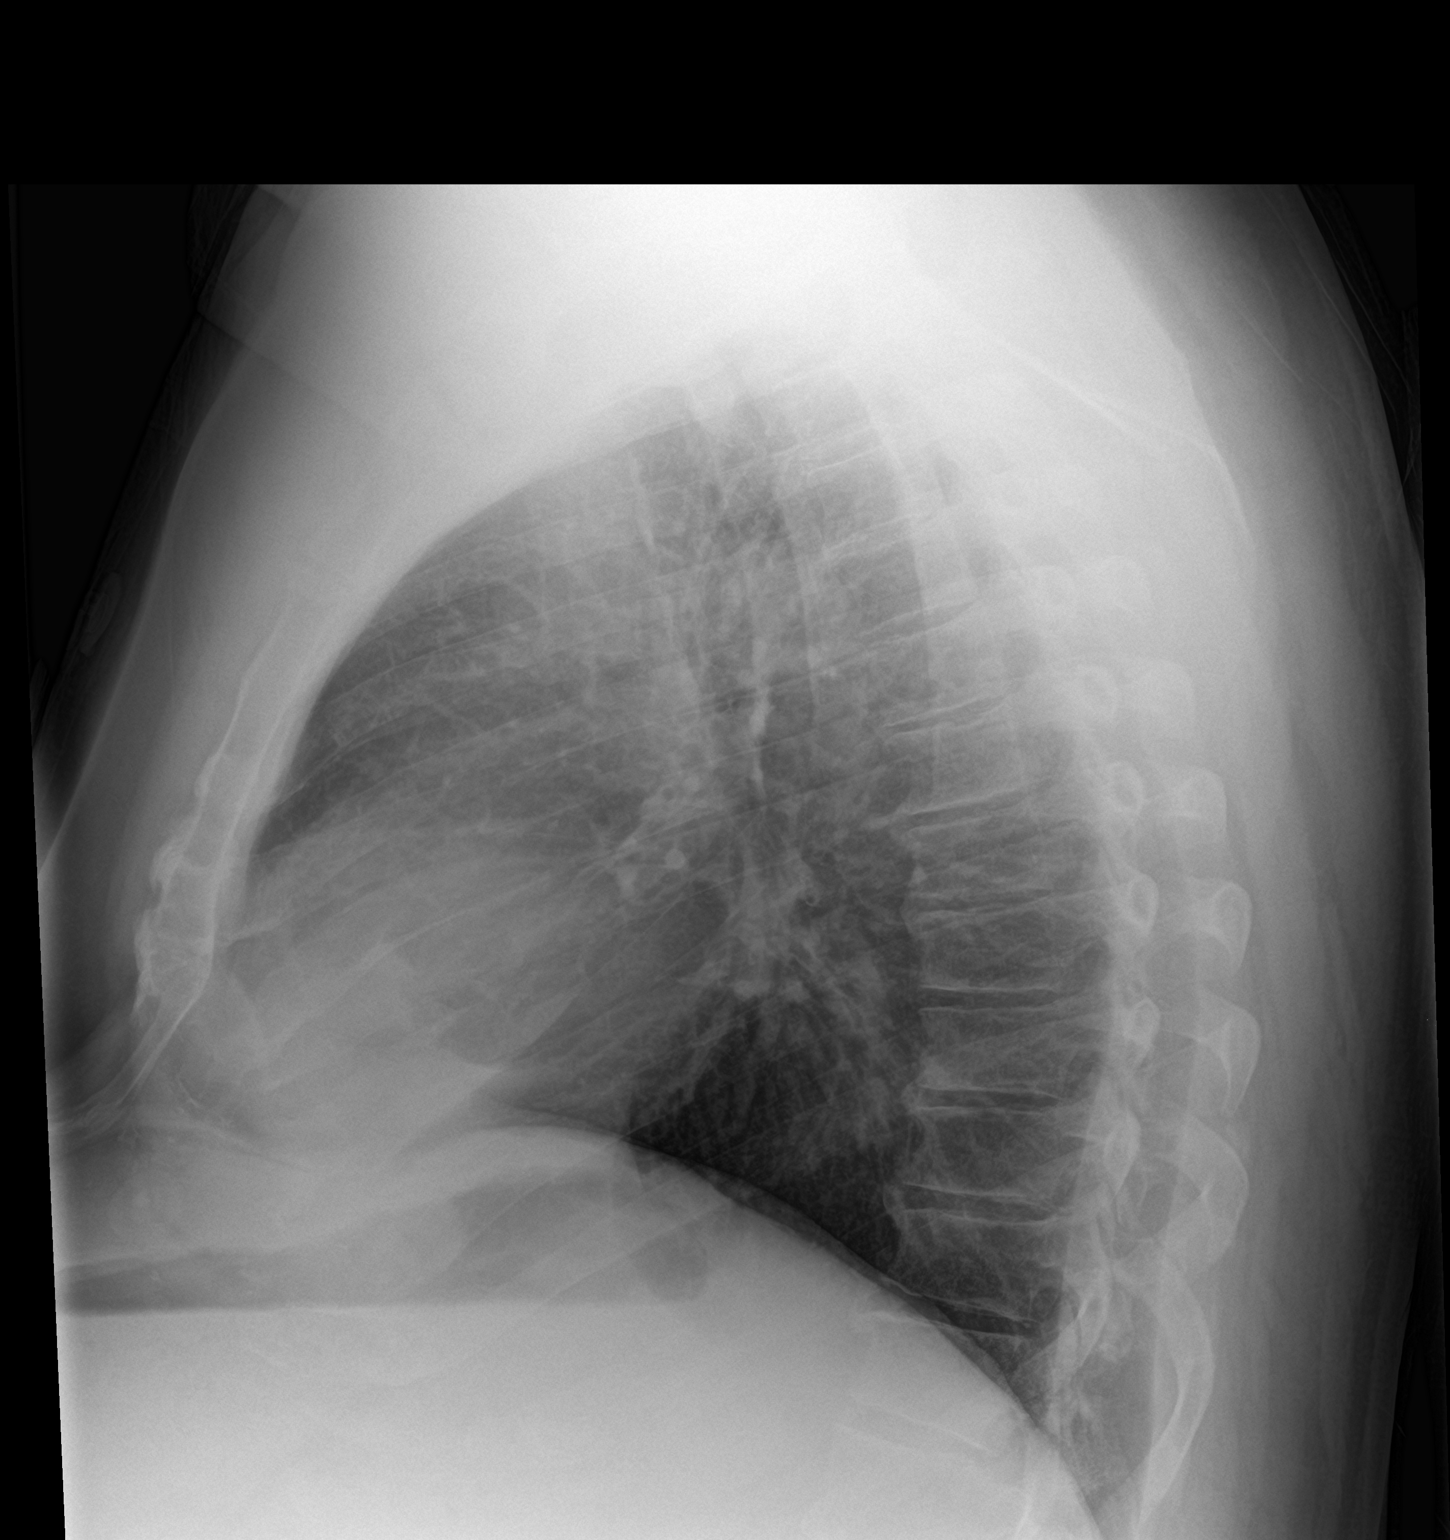

[chest pa]
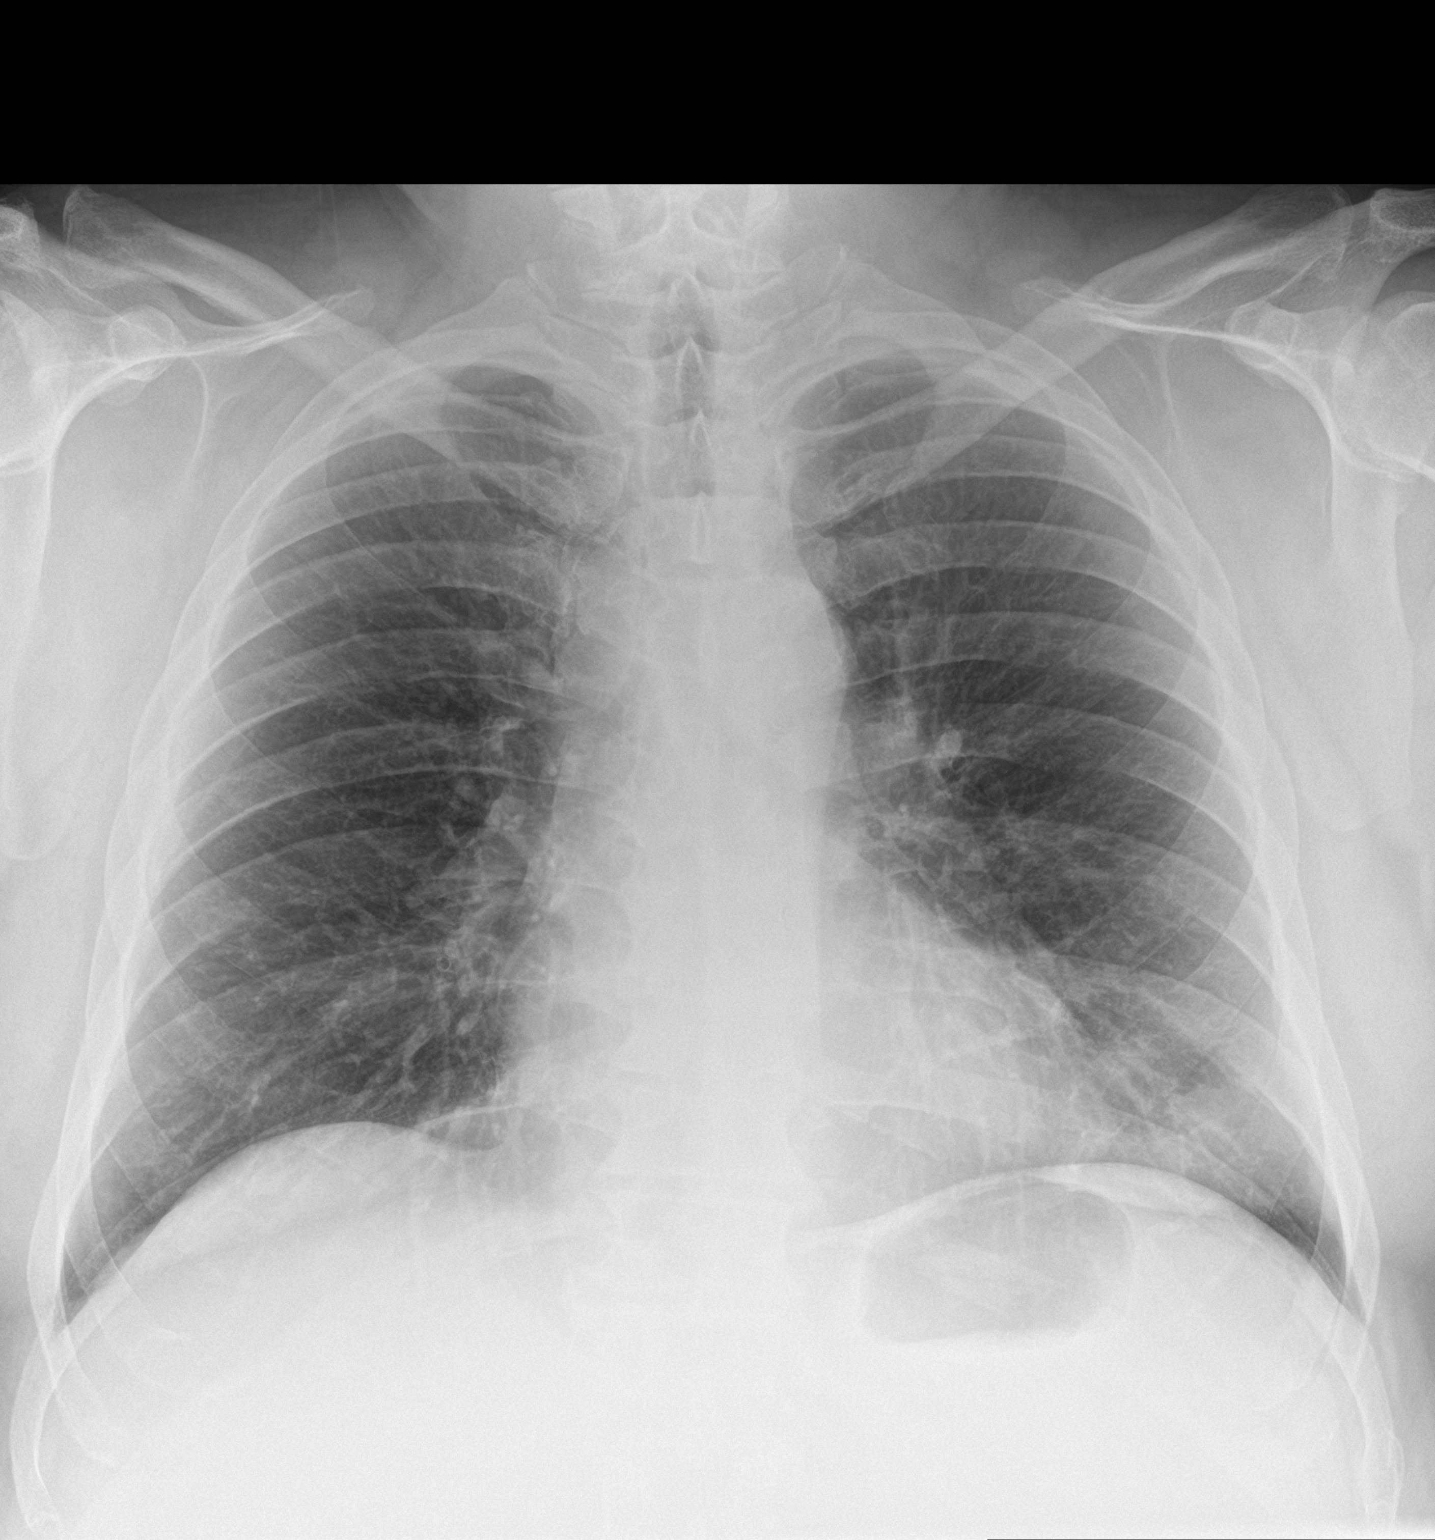

[2 of 2 positions shown; findings below may reference images not displayed]

FINDINGS: There is a poorly defined nodular density at the left lung base.
This is not clearly identified on the lateral view. Otherwise, the
lungs are clear. Heart and mediastinum are within normal limits. The
trachea is midline. No pleural effusions. Mild degenerative changes
in the thoracic spine. No large pleural effusions.
IMPRESSION: Poorly defined nodular density in the left lower chest is
indeterminate. A nipple shadow is in the differential diagnosis but
cannot exclude a lung nodular lesion. Consider follow-up chest
radiograph with nipple markers. Alternately, this could be further
evaluated with a chest CT.

## 2017-05-11 IMAGING — CT CT CHEST W/O CM
2 of 3 series · 15 of 36 positions shown, 18 images · non-contrast
Comparison: Chest radiograph 03/11/2016

CLINICAL DATA: Abnormal chest radiograph. CT recommended further
evaluation.

EXAM:
CT CHEST WITHOUT CONTRAST
TECHNIQUE: Multidetector CT imaging of the chest was performed following the
standard protocol without IV contrast.

[Series 2: chest w/o 2mm st · axial · non-contrast · 0.94mm/px · z∈[+937,+1239]mm · 12 of 179 slices shown, 15 images]
[im 14/179  mediastinal]
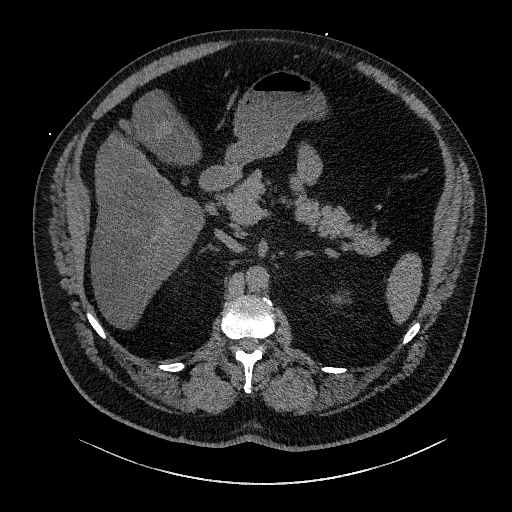
[im 14/179  lung]
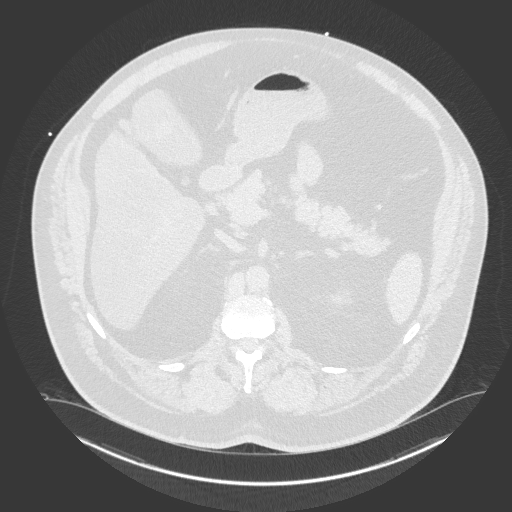
[im 27/179  lung]
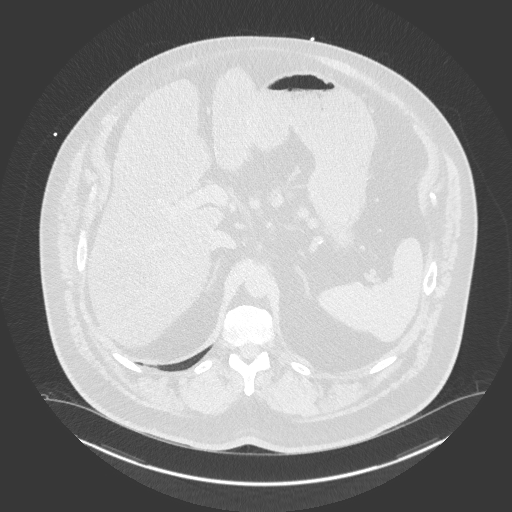
[im 40/179  lung]
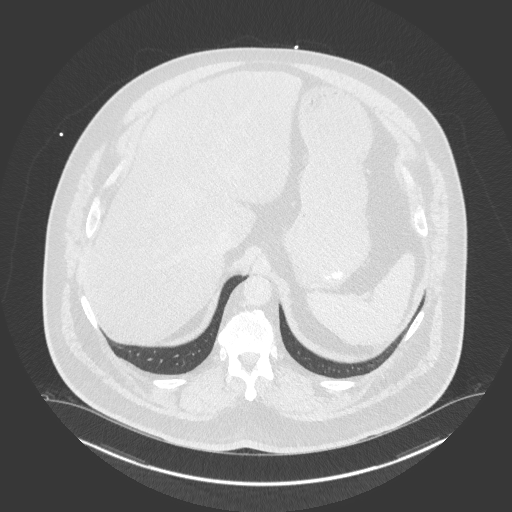
[im 53/179  lung]
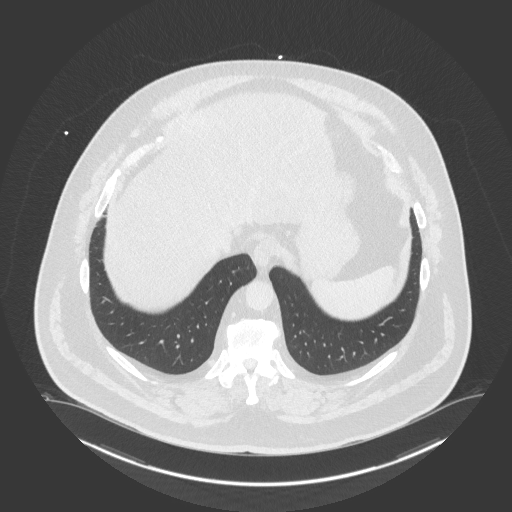
[im 66/179  mediastinal]
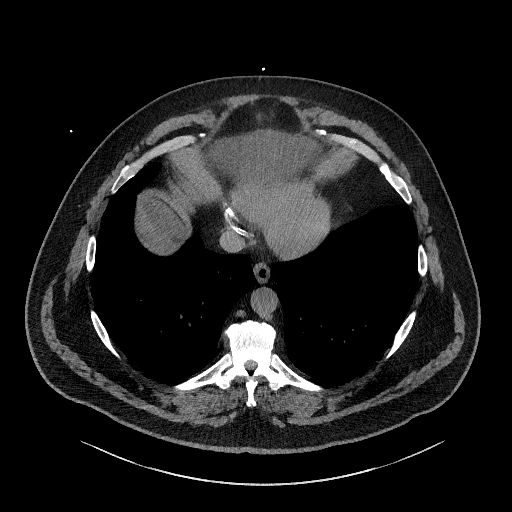
[im 66/179  lung]
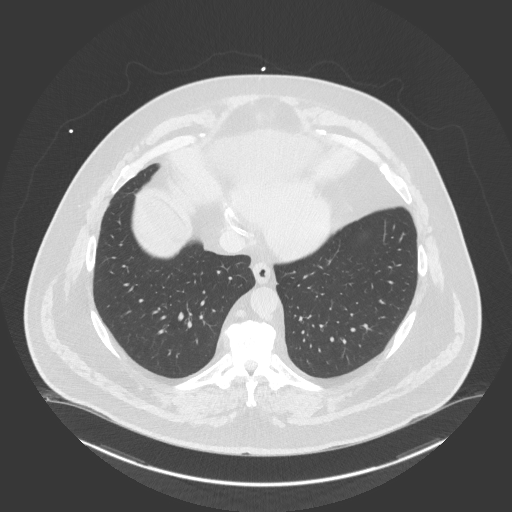
[im 80/179  lung]
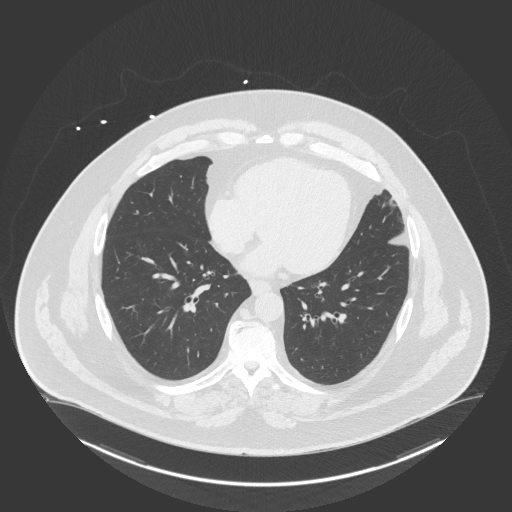
[im 99/179  lung]
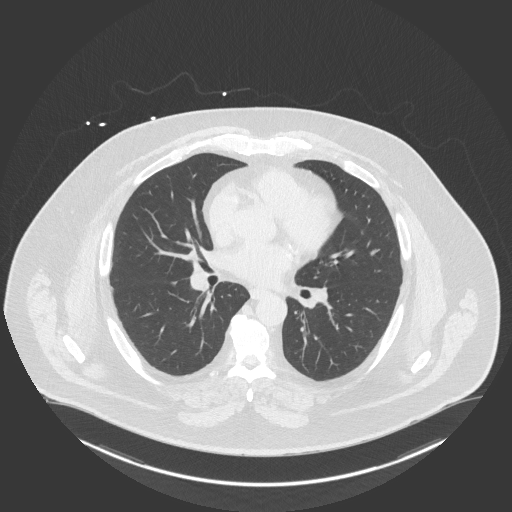
[im 113/179  lung]
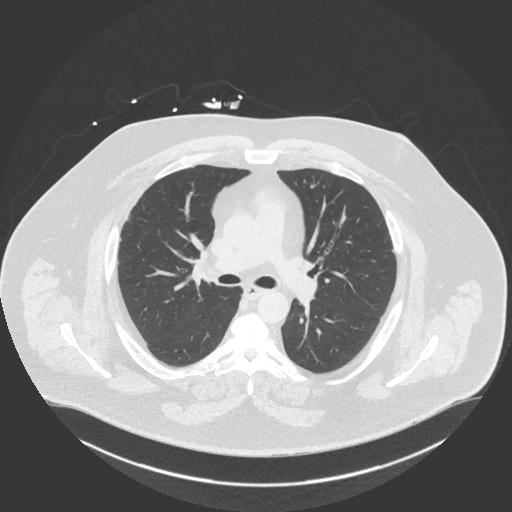
[im 126/179  mediastinal]
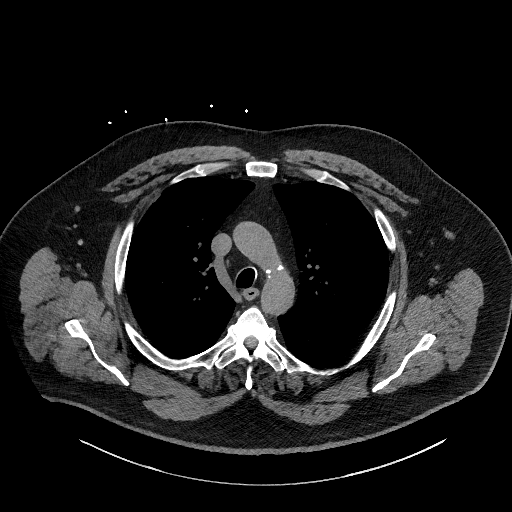
[im 126/179  lung]
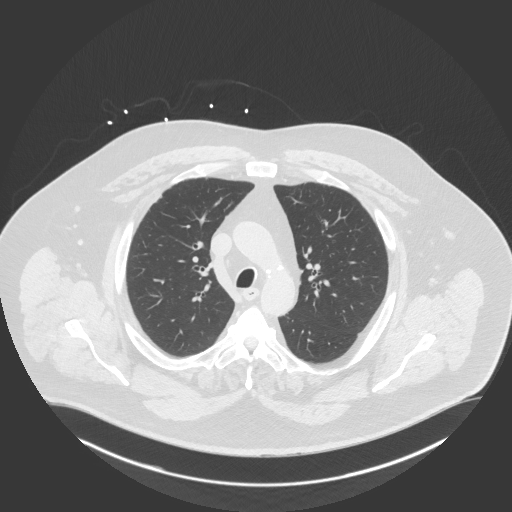
[im 139/179  lung]
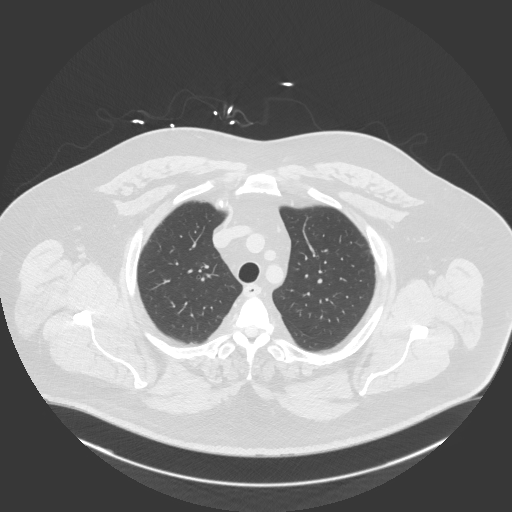
[im 152/179  lung]
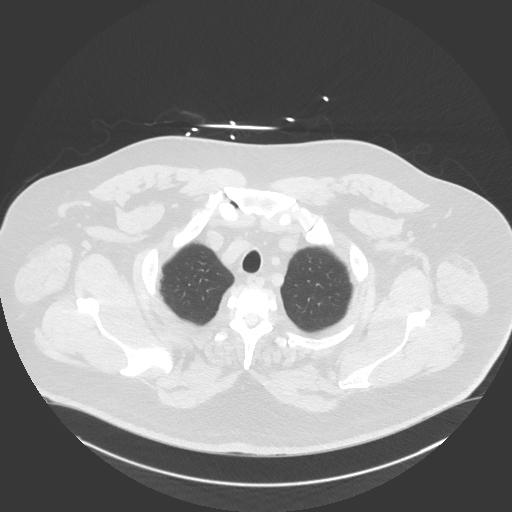
[im 165/179  lung]
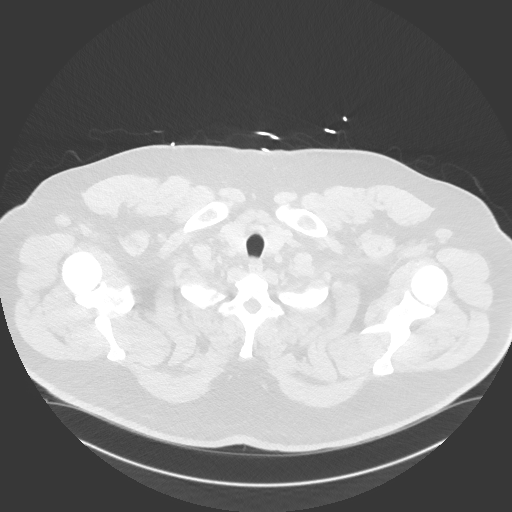

[Series 4: chest w/o 3mm st cor · coronal · non-contrast · 0.75mm/px · 3 of 101 slices shown]
[im 21/101  lung]
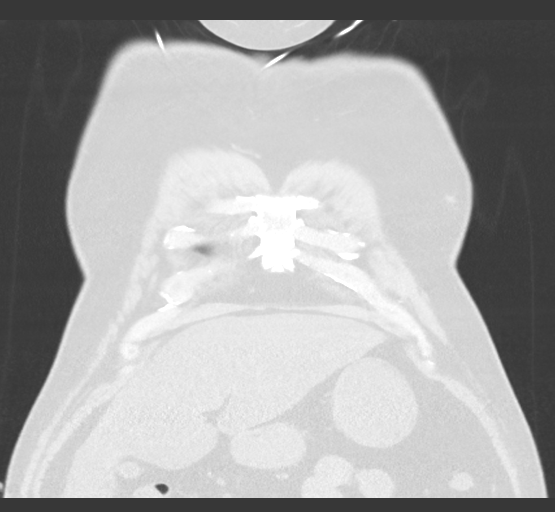
[im 41/101  lung]
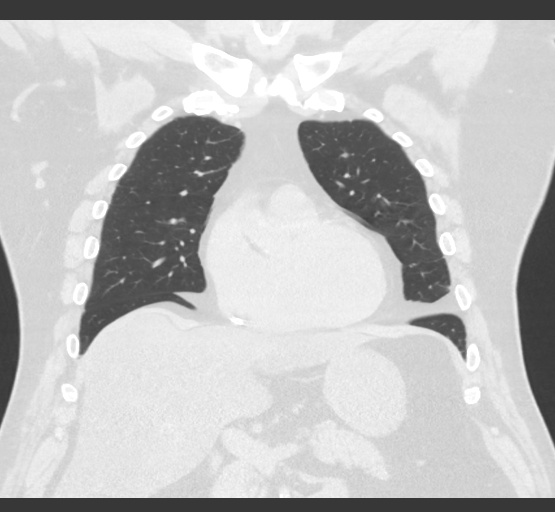
[im 61/101  lung]
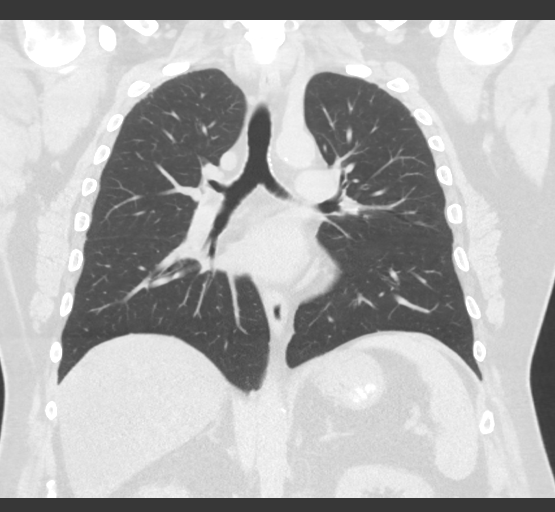

[15 of 36 positions shown; findings below may reference images not displayed]

FINDINGS: Mediastinum/Nodes: No axillary or supraclavicular lymphadenopathy.
No mediastinal hilar adenopathy. No pericardial fluid. Coronary
artery calcifications are present. Esophagus normal.

Lungs/Pleura: No discrete pulmonary parenchymal nodule within the
lingula or LEFT lower lobe to correspond to the plain film
abnormality. There is pleural thickening at the RIGHT lung base as
well as epicardial fat which likely accounts for the radiographic
density. No RIGHT lung lesion present.

Upper abdomen: Limited view of the liver, kidneys, and pancreas are
unremarkable. Normal adrenal glands. Hepatic steatosis noted

Musculoskeletal: No aggressive osseous lesion per
IMPRESSION: 1. No worrisome pulmonary nodules.
2. Hepatic steatosis.
3. Coronary artery calcifications.

## 2017-06-04 ENCOUNTER — Other Ambulatory Visit: Payer: Self-pay | Admitting: Cardiovascular Disease

## 2017-06-04 ENCOUNTER — Other Ambulatory Visit: Payer: Self-pay | Admitting: Family Medicine

## 2017-06-04 NOTE — Telephone Encounter (Signed)
Please schedule sept f/u and refill until then 

## 2017-06-04 NOTE — Telephone Encounter (Signed)
No recent/future appts., please advise  

## 2017-06-05 NOTE — Telephone Encounter (Signed)
Left voicemail requesting pt to call the office back 

## 2017-06-06 NOTE — Telephone Encounter (Signed)
Left voicemail requesting pt call the office back

## 2017-06-07 ENCOUNTER — Other Ambulatory Visit: Payer: Self-pay | Admitting: Family Medicine

## 2017-06-07 MED ORDER — LOSARTAN POTASSIUM 100 MG PO TABS: ORAL_TABLET | ORAL | 3 refills | Status: DC

## 2017-06-07 NOTE — Telephone Encounter (Signed)
Medication sent in for patient. 

## 2017-06-07 NOTE — Telephone Encounter (Signed)
Lm on pts vm requesting a call back 

## 2017-06-07 NOTE — Telephone Encounter (Signed)
Caller Name: Pt      Relationship to Patient:  Best Number:[phone]  Pharmacy:[facility]   Reason for call: scheduled appointment, wants pending medications refilled

## 2017-07-04 ENCOUNTER — Other Ambulatory Visit: Payer: Self-pay | Admitting: Family Medicine

## 2017-07-12 ENCOUNTER — Ambulatory Visit (INDEPENDENT_AMBULATORY_CARE_PROVIDER_SITE_OTHER): Payer: PRIVATE HEALTH INSURANCE | Admitting: Family Medicine

## 2017-07-12 ENCOUNTER — Encounter: Payer: Self-pay | Admitting: Family Medicine

## 2017-07-12 VITALS — BP 112/66 | HR 48 | Temp 98.0°F | Ht 70.0 in | Wt 282.2 lb

## 2017-07-12 DIAGNOSIS — I10 Essential (primary) hypertension: Secondary | ICD-10-CM | POA: Diagnosis not present

## 2017-07-12 DIAGNOSIS — E785 Hyperlipidemia, unspecified: Secondary | ICD-10-CM

## 2017-07-12 DIAGNOSIS — N400 Enlarged prostate without lower urinary tract symptoms: Secondary | ICD-10-CM

## 2017-07-12 DIAGNOSIS — E538 Deficiency of other specified B group vitamins: Secondary | ICD-10-CM

## 2017-07-12 DIAGNOSIS — R739 Hyperglycemia, unspecified: Secondary | ICD-10-CM

## 2017-07-12 LAB — COMPREHENSIVE METABOLIC PANEL
ALK PHOS: 117 U/L (ref 39–117)
ALT: 50 U/L (ref 0–53)
AST: 26 U/L (ref 0–37)
Albumin: 4.3 g/dL (ref 3.5–5.2)
BILIRUBIN TOTAL: 0.7 mg/dL (ref 0.2–1.2)
BUN: 16 mg/dL (ref 6–23)
CALCIUM: 9.6 mg/dL (ref 8.4–10.5)
CO2: 29 meq/L (ref 19–32)
Chloride: 104 mEq/L (ref 96–112)
Creatinine, Ser: 0.99 mg/dL (ref 0.40–1.50)
GFR: 83.93 mL/min (ref >60.00)
Glucose, Bld: 223 mg/dL — ABNORMAL HIGH (ref 70–99)
POTASSIUM: 4.7 meq/L (ref 3.5–5.1)
Sodium: 141 mEq/L (ref 135–145)
TOTAL PROTEIN: 6.8 g/dL (ref 6.0–8.3)

## 2017-07-12 LAB — LIPID PANEL
Cholesterol: 93 mg/dL (ref 0–200)
HDL: 34.7 mg/dL — AB (ref >39.00)
LDL Cholesterol: 48 mg/dL (ref 0–99)
NonHDL: 58.71
TRIGLYCERIDES: 52 mg/dL (ref 0.0–149.0)
Total CHOL/HDL Ratio: 3
VLDL: 10.4 mg/dL (ref 0.0–40.0)

## 2017-07-12 LAB — CBC WITH DIFFERENTIAL/PLATELET
BASOS ABS: 0.1 10*3/uL (ref 0.0–0.1)
Basophils Relative: 1 % (ref 0.0–3.0)
Eosinophils Absolute: 0.4 10*3/uL (ref 0.0–0.7)
Eosinophils Relative: 6 % — ABNORMAL HIGH (ref 0.0–5.0)
HEMATOCRIT: 43.4 % (ref 39.0–52.0)
Hemoglobin: 14.7 g/dL (ref 13.0–17.0)
LYMPHS ABS: 1.6 10*3/uL (ref 0.7–4.0)
LYMPHS PCT: 24.2 % (ref 12.0–46.0)
MCHC: 34 g/dL (ref 30.0–36.0)
MCV: 96.7 fl (ref 78.0–100.0)
MONOS PCT: 8.6 % (ref 3.0–12.0)
Monocytes Absolute: 0.6 10*3/uL (ref 0.1–1.0)
NEUTROS ABS: 3.9 10*3/uL (ref 1.4–7.7)
NEUTROS PCT: 60.2 % (ref 43.0–77.0)
PLATELETS: 146 10*3/uL — AB (ref 150.0–400.0)
RBC: 4.49 Mil/uL (ref 4.22–5.81)
RDW: 12.4 % (ref 11.5–15.5)
WBC: 6.5 10*3/uL (ref 4.0–10.5)

## 2017-07-12 LAB — HEMOGLOBIN A1C: HEMOGLOBIN A1C: 8.1 % — AB (ref 4.6–6.5)

## 2017-07-12 LAB — TSH: TSH: 1.33 u[IU]/mL (ref 0.35–4.50)

## 2017-07-12 LAB — VITAMIN B12: Vitamin B-12: 245 pg/mL (ref 211–911)

## 2017-07-12 LAB — PSA: PSA: 0.27 ng/mL (ref 0.10–4.00)

## 2017-07-12 MED ORDER — HYDROCHLOROTHIAZIDE 25 MG PO TABS: 12.5000 mg | ORAL_TABLET | Freq: Every day | ORAL | 11 refills | Status: DC

## 2017-07-12 MED ORDER — LOSARTAN POTASSIUM 100 MG PO TABS
ORAL_TABLET | ORAL | 11 refills | Status: DC
Start: 2017-07-12 — End: 2018-07-16

## 2017-07-12 MED ORDER — AMLODIPINE BESYLATE 5 MG PO TABS: ORAL_TABLET | ORAL | 11 refills | Status: DC

## 2017-07-12 NOTE — Progress Notes (Signed)
Subjective:    Patient ID: Daniel [name], male    DOB: Jan 30, 1966, 51 y.o.   MRN: [id number]  HPI  Here for f/u of chronic health problems  Wt Readings from Last 3 Encounters:  07/12/17 282 lb 4 oz (128 kg)  01/15/17 286 lb 1.9 oz (129.8 kg)  07/23/16 268 lb 8 oz (121.8 kg)   40.50 kg/m Working on treadmill - 5 miles per night (walks and then runs the last 1/2 mile)  Got down to 250 and then gained back on vacation  Still watching how he eats --- no red meat or fried foods  Goal to get to 200   Continues to see cardiology for CAD and lipids  Changed to plavix due to cost (has a stent)  Also hx of CVA  Flu shot - got one at work   bp is stable today  No cp or palpitations or headaches or edema  No side effects to medicines  BP Readings from Last 3 Encounters:  07/12/17 112/66  01/15/17 130/82  07/23/16 122/68    Very well controlled   Hx of BPH- took saw palmetto and it helped  Nocturia -0-3 times  No change in stream or flow  Lab Results  Component Value Date   PSA 0.28 04/13/2016   PSA 1.27 11/16/2013   Hx of B12 def (not taking any)  Lab Results  Component Value Date   VITAMINB12 284 11/16/2013   Prediabetes Lab Results  Component Value Date   HGBA1C 5.8 07/16/2016  has been back to good diet for 1 mo  Due for labs   On lipitor Lab Results  Component Value Date   CHOL 89 04/13/2016   HDL 33.50 (L) 04/13/2016   LDLCALC 44 04/13/2016   TRIG 57.0 04/13/2016   CHOLHDL 3 04/13/2016   Patient Active Problem List   Diagnosis Date Noted  . Hyperglycemia 04/16/2016  . Elevated transaminase level 04/16/2016  . Coronary artery disease involving native coronary artery of native heart without angina pectoris 03/20/2016  . History of non-ST elevation myocardial infarction (NSTEMI) 03/20/2016  . Dyslipidemia 03/12/2016  . NSTEMI (non-ST elevated myocardial infarction) (HCC) 03/10/2016  . Constipation 02/10/2016  . Prostate cancer screening 02/10/2016  .  CVA (cerebral infarction) 03/01/2015  . Right knee pain 04/06/2014  . Benign paroxysmal positional vertigo 03/10/2014  . BPH (benign prostatic hyperplasia) 11/16/2013  . Routine general medical examination at a health care facility 11/12/2013  . Morbid obesity (HCC) 03/02/2011  . B12 deficiency 09/29/2007  . ERECTILE DYSFUNCTION 07/01/2007  . History of tobacco abuse 07/01/2007  . LOW BACK PAIN, CHRONIC 07/01/2007  . HYPERTENSION, BENIGN ESSENTIAL 06/03/2007   Past Medical History:  Diagnosis Date  . CAD (coronary artery disease)    a. NSTEMI: 100% stenosis of the distal RCA --> DES placed 5/17 CLEARED BY CARDIOLOGIST  . Chronic low back pain    a. s/p back surgery.  . CVA (cerebral infarction)    a. 02/2015 dyarthria 2/2 CVA involving the lateral aspect of the precentral gyrus.  . DM2 (diabetes mellitus, type 2) (HCC)    a. pre-diabetic in the past. b. A1c 03/2016 elevated to 7.9.  . ETOH abuse   . GERD (gastroesophageal reflux disease)   . History of echocardiogram    a. Mild LVH, EF 55-60%, Definity contrast used-LV wall motion could not be adequately assessed  . History of non-ST elevation myocardial infarction (NSTEMI) 03/2016   a. PCI: 3.5 x 24 mm Promus  Premier DES to distal RCA  . History of tobacco abuse   . Hypertension    a. 02/2015 echo: 55-65%, trace TR/MR. b. 03/2016: echo with EF of 55-60%.   . Obesity   . Ruptured disk   . Stroke [facility](HCC)    2016   Past Surgical History:  Procedure Laterality Date  . BACK SURGERY     ruptured disk, L-S  . CARDIAC CATHETERIZATION  05/2004   minimal CAD  . CARDIAC CATHETERIZATION N/A 03/10/2016   Procedure: Left Heart Cath and Coronary Angiography;  Surgeon: Kathleene Hazelhristopher D McAlhany, MD;  [facility] CV LAB;  Service: Cardiovascular;  Laterality: N/A;  . CARDIAC CATHETERIZATION N/A 03/10/2016   Procedure: Coronary Stent Intervention;  Surgeon: Kathleene Hazelhristopher D McAlhany, MD;  Location: [facility] INVASIVE CV LAB;  Service: Cardiovascular;   Laterality: N/A;  . CATARACT EXTRACTION W/PHACO Left 07/12/2016   Procedure: CATARACT EXTRACTION PHACO AND INTRAOCULAR LENS PLACEMENT (IOC);  Surgeon: Nevada CraneBradley Mark King, MD;  Location: [facility] ORS;  Service: Ophthalmology;  Laterality: Left;  US  00:50 AP% 9.2 CDE 4.63 fluid pack lot # [id number] H  . CORONARY ANGIOPLASTY     STENT 5/17   Social History  Substance Use Topics  . Smoking status: Former Smoker    Types: Cigarettes    Quit date: 01/18/2015  . Smokeless tobacco: Never Used  . Alcohol use 0.0 oz/week     Comment: about 3 12 packs/wk.   Family History  Problem Relation Age of Onset  . Hypertension Father   . Heart disease Father        MI  . Cancer Maternal Grandmother        lung  . Heart failure Paternal Grandfather    Allergies  Allergen Reactions  . Ace Inhibitors     REACTION: cough   Current Outpatient Prescriptions on File Prior to Visit  Medication Sig Dispense Refill  . aspirin 81 MG tablet Take 81 mg by mouth daily.    Marland Kitchen. atorvastatin (LIPITOR) 80 MG tablet TAKE 1 TABLET (80 MG TOTAL) BY MOUTH DAILY AT 6 PM. 30 tablet 7  . carvedilol (COREG) 6.25 MG tablet TAKE 1 TABLET (6.25 MG TOTAL) BY MOUTH 2 (TWO) TIMES DAILY WITH A MEAL. 60 tablet 7  . clopidogrel (PLAVIX) 75 MG tablet Take 1 tablet (75 mg total) by mouth daily. 34 tablet 11  . nitroGLYCERIN (NITROSTAT) 0.4 MG SL tablet Place 1 tablet (0.4 mg total) under the tongue every 5 (five) minutes as needed for chest pain (CP or SOB). 25 tablet 3  . ranitidine (ZANTAC) 150 MG tablet Take 150 mg by mouth 2 (two) times daily as needed for heartburn.     . sildenafil (VIAGRA) 50 MG tablet Take 1 tablet (50 mg total) by mouth daily as needed. Take one by mouth once daily as directed as needed 30 minutes before sexual activity. 10 tablet 11   No current facility-administered medications on file prior to visit.     Review of Systems  Constitutional: Negative for activity change, appetite change, fatigue, fever and  unexpected weight change.  HENT: Negative for congestion, rhinorrhea, sore throat and trouble swallowing.   Eyes: Negative for pain, redness, itching and visual disturbance.  Respiratory: Negative for cough, chest tightness, shortness of breath and wheezing.   Cardiovascular: Negative for chest pain and palpitations.  Gastrointestinal: Negative for abdominal pain, blood in stool, constipation, diarrhea and nausea.  Endocrine: Negative for cold intolerance, heat intolerance, polydipsia and polyuria.  Genitourinary: Negative for  difficulty urinating, dysuria, frequency and urgency.  Musculoskeletal: Negative for arthralgias, joint swelling and myalgias.  Skin: Negative for pallor and rash.  Neurological: Negative for dizziness, tremors, weakness, numbness and headaches.  Hematological: Negative for adenopathy. Does not bruise/bleed easily.  Psychiatric/Behavioral: Negative for decreased concentration and dysphoric mood. The patient is not nervous/anxious.        Objective:   Physical Exam  Constitutional: He appears well-developed and well-nourished. No distress.  obese and well appearing  (central obesity)  HENT:  Head: Normocephalic and atraumatic.  Mouth/Throat: Oropharynx is clear and moist.  Eyes: Pupils are equal, round, and reactive to light. Conjunctivae and EOM are normal.  Neck: Normal range of motion. Neck supple. No JVD present. Carotid bruit is not present. No thyromegaly present.  Cardiovascular: Normal rate, regular rhythm, normal heart sounds and intact distal pulses.  Exam reveals no gallop.   Pulmonary/Chest: Effort normal and breath sounds normal. No respiratory distress. He has no wheezes. He has no rales.  No crackles  Abdominal: Soft. Bowel sounds are normal. He exhibits no distension, no abdominal bruit and no mass. There is no tenderness.  Musculoskeletal: He exhibits no edema.  Lymphadenopathy:    He has no cervical adenopathy.  Neurological: He is alert. He  has normal reflexes. No cranial nerve deficit. He exhibits normal muscle tone. Coordination normal.  Skin: Skin is warm and dry. No rash noted. No pallor.  Ruddy complexion  Psychiatric: He has a normal mood and affect.          Assessment & Plan:   Problem List Items Addressed This Visit      Cardiovascular and Mediastinum   HYPERTENSION, BENIGN ESSENTIAL - Primary    bp in fair control at this time  BP Readings from Last 1 Encounters:  07/12/17 112/66   No changes needed Disc lifstyle change with low sodium diet and exercise  Labs today Losartan, hctz, amlodipine , coreg continued  F/u cardiology for CAD      Relevant Medications   losartan (COZAAR) 100 MG tablet   hydrochlorothiazide (HYDRODIURIL) 25 MG tablet   amLODipine (NORVASC) 5 MG tablet   Other Relevant Orders   CBC with Differential/Platelet (Completed)   Comprehensive metabolic panel (Completed)   Lipid panel (Completed)   TSH (Completed)     Genitourinary   BPH (benign prostatic hyperplasia)    Due for PSA No symptoms currently  No family hx of prostate cancer       Relevant Orders   PSA (Completed)     Other   B12 deficiency    Pt is not taking any  B12 level today       Relevant Orders   Vitamin B12 (Completed)   Dyslipidemia    Lipids today  Hx of CAD and MI  On atorvastatin 80 and diet Disc goals for lipids and reasons to control them Rev labs with pt  (last draw)  Rev low sat fat diet in detail       Hyperglycemia    Due for A1C today  disc imp of low glycemic diet and wt loss to prevent DM2  He intends to get back on track with wt loss effort       Relevant Orders   Hemoglobin A1c (Completed)   Morbid obesity (HCC)    Discussed how this problem influences overall health and the risks it imposes  Reviewed plan for weight loss with lower calorie diet (via better food choices and also portion control or  program like weight watchers) and exercise building up to or more than 30  minutes 5 days per week including some aerobic activity

## 2017-07-12 NOTE — Patient Instructions (Signed)
Take care of yourself  Keep working on healthy diet and exercise   Labs today

## 2017-07-14 NOTE — Assessment & Plan Note (Signed)
Pt is not taking any  B12 level today

## 2017-07-14 NOTE — Assessment & Plan Note (Addendum)
Lipids today  Hx of CAD and MI  On atorvastatin 80 and diet Disc goals for lipids and reasons to control them Rev labs with pt  (last draw)  Rev low sat fat diet in detail

## 2017-07-14 NOTE — Assessment & Plan Note (Signed)
Due for PSA No symptoms currently  No family hx of prostate cancer

## 2017-07-14 NOTE — Assessment & Plan Note (Signed)
Discussed how this problem influences overall health and the risks it imposes  Reviewed plan for weight loss with lower calorie diet (via better food choices and also portion control or program like weight watchers) and exercise building up to or more than 30 minutes 5 days per week including some aerobic activity    

## 2017-07-14 NOTE — Assessment & Plan Note (Signed)
Due for A1C today  disc imp of low glycemic diet and wt loss to prevent DM2  He intends to get back on track with wt loss effort

## 2017-07-14 NOTE — Assessment & Plan Note (Signed)
bp in fair control at this time  BP Readings from Last 1 Encounters:  07/12/17 112/66   No changes needed Disc lifstyle change with low sodium diet and exercise  Labs today Losartan, hctz, amlodipine , coreg continued  F/u cardiology for CAD

## 2017-07-22 ENCOUNTER — Encounter: Payer: Self-pay | Admitting: Family Medicine

## 2017-07-22 ENCOUNTER — Ambulatory Visit (INDEPENDENT_AMBULATORY_CARE_PROVIDER_SITE_OTHER): Payer: PRIVATE HEALTH INSURANCE | Admitting: Family Medicine

## 2017-07-22 VITALS — BP 118/76 | HR 68 | Temp 98.0°F | Ht 70.0 in | Wt 279.0 lb

## 2017-07-22 DIAGNOSIS — E785 Hyperlipidemia, unspecified: Secondary | ICD-10-CM

## 2017-07-22 DIAGNOSIS — E119 Type 2 diabetes mellitus without complications: Secondary | ICD-10-CM | POA: Diagnosis not present

## 2017-07-22 DIAGNOSIS — E538 Deficiency of other specified B group vitamins: Secondary | ICD-10-CM

## 2017-07-22 DIAGNOSIS — E1165 Type 2 diabetes mellitus with hyperglycemia: Secondary | ICD-10-CM | POA: Insufficient documentation

## 2017-07-22 MED ORDER — METFORMIN HCL 500 MG PO TABS: 500.0000 mg | ORAL_TABLET | Freq: Two times a day (BID) | ORAL | 11 refills | Status: DC

## 2017-07-22 NOTE — Progress Notes (Signed)
Subjective:    Patient ID: Daniel [name], male    DOB: 1965/12/19, 51 y.o.   MRN: [id number]  HPI  Here for f/u of chronic medical problems   Wt Readings from Last 3 Encounters:  07/22/17 279 lb (126.6 kg)  07/12/17 282 lb 4 oz (128 kg)  01/15/17 286 lb 1.9 oz (129.8 kg)  lost 3 lb  Back on the treadmill  40.03 kg/m   A1C went up  Lab Results  Component Value Date   HGBA1C 8.1 (H) 07/12/2017   This is up from 5.8 the check before  Has been diabetic in the past  On arb for renal protection   He started checking at home 180s-low 200s  Has controlled with diet and exercise  Eating a lot less  Not interested in a diabetes class yet   Eating more salads and lean protein     Back on your treadmill - tries to get at least 3-5 miles per day     On lipitor and also hctz - which can raise glucose   BP Readings from Last 3 Encounters:  07/22/17 118/76  07/12/17 112/66  01/15/17 130/82   bp is in good control  CAD with MI in the past    B12 was low nl in labs Lab Results  Component Value Date   VITAMINB12 245 07/12/2017   Asked to start daily B12 1000 mcg   Other labs Lab Results  Component Value Date   CREATININE 0.99 07/12/2017   BUN 16 07/12/2017   NA 141 07/12/2017   K 4.7 07/12/2017   CL 104 07/12/2017   CO2 29 07/12/2017   Lab Results  Component Value Date   ALT 50 07/12/2017   AST 26 07/12/2017   ALKPHOS 117 07/12/2017   BILITOT 0.7 07/12/2017   Lab Results  Component Value Date   TSH 1.33 07/12/2017   PSA 0.27 Glucose was 223  Cholesterol Lab Results  Component Value Date   CHOL 93 07/12/2017   HDL 34.70 (L) 07/12/2017   LDLCALC 48 07/12/2017   TRIG 52.0 07/12/2017   CHOLHDL 3 07/12/2017   Exercising  On atorvastatin  Avoiding red meat and fatty foods  Patient Active Problem List   Diagnosis Date Noted  . Type 2 diabetes mellitus without complications (HCC) 07/22/2017  . Hyperglycemia 04/16/2016  . Elevated transaminase  level 04/16/2016  . Coronary artery disease involving native coronary artery of native heart without angina pectoris 03/20/2016  . History of non-ST elevation myocardial infarction (NSTEMI) 03/20/2016  . Dyslipidemia 03/12/2016  . NSTEMI (non-ST elevated myocardial infarction) (HCC) 03/10/2016  . Constipation 02/10/2016  . Prostate cancer screening 02/10/2016  . CVA (cerebral infarction) 03/01/2015  . Right knee pain 04/06/2014  . Benign paroxysmal positional vertigo 03/10/2014  . BPH (benign prostatic hyperplasia) 11/16/2013  . Routine general medical examination at a health care facility 11/12/2013  . Morbid obesity (HCC) 03/02/2011  . B12 deficiency 09/29/2007  . ERECTILE DYSFUNCTION 07/01/2007  . History of tobacco abuse 07/01/2007  . LOW BACK PAIN, CHRONIC 07/01/2007  . HYPERTENSION, BENIGN ESSENTIAL 06/03/2007   Past Medical History:  Diagnosis Date  . CAD (coronary artery disease)    a. NSTEMI: 100% stenosis of the distal RCA --> DES placed 5/17 CLEARED BY CARDIOLOGIST  . Chronic low back pain    a. s/p back surgery.  . CVA (cerebral infarction)    a. 02/2015 dyarthria 2/2 CVA involving the lateral aspect of the precentral gyrus.  Marland Kitchen  DM2 (diabetes mellitus, type 2) (HCC)    a. pre-diabetic in the past. b. A1c 03/2016 elevated to 7.9.  . ETOH abuse   . GERD (gastroesophageal reflux disease)   . History of echocardiogram    a. Mild LVH, EF 55-60%, Definity contrast used-LV wall motion could not be adequately assessed  . History of non-ST elevation myocardial infarction (NSTEMI) 03/2016   a. PCI: 3.5 x 24 mm Promus Premier DES to distal RCA  . History of tobacco abuse   . Hypertension    a. 02/2015 echo: 55-65%, trace TR/MR. b. 03/2016: echo with EF of 55-60%.   . Obesity   . Ruptured disk   . Stroke [facility])    2016   Past Surgical History:  Procedure Laterality Date  . BACK SURGERY     ruptured disk, L-S  . CARDIAC CATHETERIZATION  05/2004   minimal CAD  . CARDIAC  CATHETERIZATION N/A 03/10/2016   Procedure: Left Heart Cath and Coronary Angiography;  Surgeon: Kathleene Hazel, MD;  Location: [facility] INVASIVE CV LAB;  Service: Cardiovascular;  Laterality: N/A;  . CARDIAC CATHETERIZATION N/A 03/10/2016   Procedure: Coronary Stent Intervention;  Surgeon: Kathleene Hazel, MD;  Location: [facility] INVASIVE CV LAB;  Service: Cardiovascular;  Laterality: N/A;  . CATARACT EXTRACTION W/PHACO Left 07/12/2016   Procedure: CATARACT EXTRACTION PHACO AND INTRAOCULAR LENS PLACEMENT (IOC);  Surgeon: Nevada Crane, MD;  Location: [facility] ORS;  Service: Ophthalmology;  Laterality: Left;  [place]  00:50 AP% 9.2 CDE 4.63 fluid pack lot # [id number]  . CORONARY ANGIOPLASTY     STENT 5/17   Social History  Substance Use Topics  . Smoking status: Former Smoker    Types: Cigarettes    Quit date: 01/18/2015  . Smokeless tobacco: Never Used  . Alcohol use 0.0 oz/week     Comment: about 3 12 packs/wk.   Family History  Problem Relation Age of Onset  . Hypertension Father   . Heart disease Father        MI  . Cancer Maternal Grandmother        lung  . Heart failure Paternal Grandfather    Allergies  Allergen Reactions  . Ace Inhibitors     REACTION: cough   Current Outpatient Prescriptions on File Prior to Visit  Medication Sig Dispense Refill  . amLODipine (NORVASC) 5 MG tablet TAKE 1 TABLET (5 MG TOTAL) BY MOUTH DAILY. 30 tablet 11  . aspirin 81 MG tablet Take 81 mg by mouth daily.    Marland Kitchen atorvastatin (LIPITOR) 80 MG tablet TAKE 1 TABLET (80 MG TOTAL) BY MOUTH DAILY AT 6 PM. 30 tablet 7  . carvedilol (COREG) 6.25 MG tablet TAKE 1 TABLET (6.25 MG TOTAL) BY MOUTH 2 (TWO) TIMES DAILY WITH A MEAL. 60 tablet 7  . clopidogrel (PLAVIX) 75 MG tablet Take 1 tablet (75 mg total) by mouth daily. 34 tablet 11  . hydrochlorothiazide (HYDRODIURIL) 25 MG tablet Take 0.5 tablets (12.5 mg total) by mouth daily. 30 tablet 11  . losartan (COZAAR) 100 MG tablet TAKE 1 TABLET (100 MG TOTAL) BY  MOUTH DAILY. 30 tablet 11  . nitroGLYCERIN (NITROSTAT) 0.4 MG SL tablet Place 1 tablet (0.4 mg total) under the tongue every 5 (five) minutes as needed for chest pain (CP or SOB). 25 tablet 3  . ranitidine (ZANTAC) 150 MG tablet Take 150 mg by mouth 2 (two) times daily as needed for heartburn.     . sildenafil (VIAGRA) 50 MG tablet Take  1 tablet (50 mg total) by mouth daily as needed. Take one by mouth once daily as directed as needed 30 minutes before sexual activity. 10 tablet 11   No current facility-administered medications on file prior to visit.     Review of Systems  Constitutional: Negative for activity change, appetite change, fatigue, fever and unexpected weight change.  HENT: Negative for congestion, rhinorrhea, sore throat and trouble swallowing.   Eyes: Negative for pain, redness, itching and visual disturbance.  Respiratory: Negative for cough, chest tightness, shortness of breath and wheezing.   Cardiovascular: Negative for chest pain and palpitations.  Gastrointestinal: Negative for abdominal pain, blood in stool, constipation, diarrhea and nausea.  Endocrine: Negative for cold intolerance, heat intolerance, polydipsia and polyuria.  Genitourinary: Negative for difficulty urinating, dysuria, frequency and urgency.  Musculoskeletal: Negative for arthralgias, joint swelling and myalgias.  Skin: Negative for pallor and rash.  Neurological: Negative for dizziness, tremors, weakness, numbness and headaches.  Hematological: Negative for adenopathy. Does not bruise/bleed easily.  Psychiatric/Behavioral: Negative for decreased concentration and dysphoric mood. The patient is not nervous/anxious.        Objective:   Physical Exam  Constitutional: He appears well-developed and well-nourished. No distress.  Very obese and well appearing    HENT:  Head: Normocephalic and atraumatic.  Mouth/Throat: Oropharynx is clear and moist.  Eyes: Pupils are equal, round, and reactive to  light. Conjunctivae and EOM are normal.  Neck: Normal range of motion. Neck supple. No JVD present. Carotid bruit is not present. No thyromegaly present.  Cardiovascular: Normal rate, regular rhythm, normal heart sounds and intact distal pulses.  Exam reveals no gallop.   Pulmonary/Chest: Effort normal and breath sounds normal. No respiratory distress. He has no wheezes. He has no rales.  No crackles  Abdominal: Soft. Bowel sounds are normal. He exhibits no distension, no abdominal bruit and no mass. There is no tenderness.  Musculoskeletal: He exhibits no edema.  Lymphadenopathy:    He has no cervical adenopathy.  Neurological: He is alert. He has normal reflexes.  Skin: Skin is warm and dry. No rash noted. No pallor.  Psychiatric: He has a normal mood and affect.          Assessment & Plan:   Problem List Items Addressed This Visit      Endocrine   Type 2 diabetes mellitus without complications (HCC)    Lab Results  Component Value Date   HGBA1C 8.1 (H) 07/12/2017   This is up much from 5.8 with poor diet and weight gain  Declines dm teaching but may consider it later  Working on exercise  Disc low glycemic diet in detail / will begin by cutting refined carbs  Px metformin 500 mg bid - disc side eff like diarrhea to watch out for  Nl foot exam Disc foot and eye care  F/u 3 mo with lab prior       Relevant Medications   metFORMIN (GLUCOPHAGE) 500 MG tablet     Other   B12 deficiency - Primary    inst to start 1000 mcg B12 daily otc       Dyslipidemia    With hx of cva and MI  LDL at goal below 70 Will work on exercise for low HDL Disc goals for lipids and reasons to control them Rev labs with pt Rev low sat fat diet in detail Will continue high dose statin

## 2017-07-22 NOTE — Assessment & Plan Note (Signed)
With hx of cva and MI  LDL at goal below 70 Will work on exercise for low HDL Disc goals for lipids and reasons to control them Rev labs with pt Rev low sat fat diet in detail Will continue high dose statin

## 2017-07-22 NOTE — Assessment & Plan Note (Signed)
Lab Results  Component Value Date   HGBA1C 8.1 (H) 07/12/2017   This is up much from 5.8 with poor diet and weight gain  Declines dm teaching but may consider it later  Working on exercise  Disc low glycemic diet in detail / will begin by cutting refined carbs  Px metformin 500 mg bid - disc side eff like diarrhea to watch out for  Nl foot exam Disc foot and eye care  F/u 3 mo with lab prior

## 2017-07-22 NOTE — Patient Instructions (Addendum)
For diabetes :   Try to get most of your carbohydrates from produce (with the exception of white potatoes) - sweet potatoes are better  Eat less bread/pasta/rice/snack foods/cereals/sweets and other items from the middle of the grocery store (processed carbs)   Weight loss helps a lot as well  Keep exercising   We would like blood sugar at 120 or below fasting in the am and 140 or below 2 hours after a meal   Start metformin twice daily  If you have side effects like diarrhea hold it and let me know   You have diabetes type 2 (adult onset)   Follow up in 3 months with labs prior

## 2017-07-22 NOTE — Assessment & Plan Note (Signed)
inst to start 1000 mcg B12 daily otc

## 2017-08-28 ENCOUNTER — Emergency Department (HOSPITAL_COMMUNITY)
Admission: EM | Admit: 2017-08-28 | Discharge: 2017-08-28 | Disposition: A | Discharge status: Home / Self Care | Payer: PRIVATE HEALTH INSURANCE | Attending: Emergency Medicine | Admitting: Emergency Medicine

## 2017-08-28 ENCOUNTER — Encounter (HOSPITAL_COMMUNITY): Payer: Self-pay | Admitting: *Deleted

## 2017-08-28 ENCOUNTER — Emergency Department (HOSPITAL_COMMUNITY): Payer: PRIVATE HEALTH INSURANCE

## 2017-08-28 DIAGNOSIS — I2510 Atherosclerotic heart disease of native coronary artery without angina pectoris: Secondary | ICD-10-CM | POA: Insufficient documentation

## 2017-08-28 DIAGNOSIS — I252 Old myocardial infarction: Secondary | ICD-10-CM | POA: Diagnosis not present

## 2017-08-28 DIAGNOSIS — Z79899 Other long term (current) drug therapy: Secondary | ICD-10-CM | POA: Diagnosis not present

## 2017-08-28 DIAGNOSIS — I10 Essential (primary) hypertension: Secondary | ICD-10-CM | POA: Diagnosis not present

## 2017-08-28 DIAGNOSIS — R0602 Shortness of breath: Secondary | ICD-10-CM | POA: Insufficient documentation

## 2017-08-28 DIAGNOSIS — R079 Chest pain, unspecified: Secondary | ICD-10-CM | POA: Diagnosis not present

## 2017-08-28 DIAGNOSIS — Z7982 Long term (current) use of aspirin: Secondary | ICD-10-CM | POA: Diagnosis not present

## 2017-08-28 DIAGNOSIS — Z87891 Personal history of nicotine dependence: Secondary | ICD-10-CM | POA: Insufficient documentation

## 2017-08-28 DIAGNOSIS — E1165 Type 2 diabetes mellitus with hyperglycemia: Secondary | ICD-10-CM | POA: Diagnosis not present

## 2017-08-28 DIAGNOSIS — Z8673 Personal history of transient ischemic attack (TIA), and cerebral infarction without residual deficits: Secondary | ICD-10-CM | POA: Diagnosis not present

## 2017-08-28 LAB — BASIC METABOLIC PANEL
ANION GAP: 10 (ref 5–15)
BUN: 18 mg/dL (ref 6–20)
CALCIUM: 9.6 mg/dL (ref 8.9–10.3)
CHLORIDE: 102 mmol/L (ref 101–111)
CO2: 24 mmol/L (ref 22–32)
Creatinine, Ser: 1.01 mg/dL (ref 0.61–1.24)
GFR calc non Af Amer: >60 mL/min (ref >60)
GLUCOSE: 159 mg/dL — AB (ref 65–99)
POTASSIUM: 4.3 mmol/L (ref 3.5–5.1)
Sodium: 136 mmol/L (ref 135–145)

## 2017-08-28 LAB — CBC
HEMATOCRIT: 44.6 % (ref 39.0–52.0)
HEMOGLOBIN: 15.2 g/dL (ref 13.0–17.0)
MCH: 32.5 pg (ref 26.0–34.0)
MCHC: 34.1 g/dL (ref 30.0–36.0)
MCV: 95.5 fL (ref 78.0–100.0)
Platelets: 178 10*3/uL (ref 150–400)
RBC: 4.67 MIL/uL (ref 4.22–5.81)
RDW: 12.5 % (ref 11.5–15.5)
WBC: 9.1 10*3/uL (ref 4.0–10.5)

## 2017-08-28 LAB — I-STAT TROPONIN, ED
TROPONIN I, POC: 0 ng/mL (ref 0.00–0.08)
TROPONIN I, POC: 0 ng/mL (ref 0.00–0.08)

## 2017-08-28 LAB — D-DIMER, QUANTITATIVE: D-Dimer, Quant: 0.43 ug/mL-FEU (ref 0.00–0.50)

## 2017-08-28 MED ORDER — GI COCKTAIL ~~LOC~~
30.0000 mL | Freq: Once | ORAL | Status: AC
  Administered 2017-08-28: 30 mL via ORAL
  Filled 2017-08-28: qty 30

## 2017-08-28 MED ORDER — ASPIRIN 81 MG PO CHEW
243.0000 mg | CHEWABLE_TABLET | Freq: Once | ORAL | Status: AC
  Administered 2017-08-28: 243 mg via ORAL
  Filled 2017-08-28: qty 3

## 2017-08-28 MED ORDER — NITROGLYCERIN 0.4 MG SL SUBL
0.4000 mg | SUBLINGUAL_TABLET | SUBLINGUAL | Status: DC | PRN
  Administered 2017-08-28: 0.4 mg via SUBLINGUAL
  Filled 2017-08-28: qty 1

## 2017-08-28 NOTE — ED Triage Notes (Signed)
To ED for eval of left chest pressure since Monday. Pt states the pressure is constant and feels similar to his MI a year ago. Skin w/d, resp e/u. Appears in nad.

## 2017-08-28 NOTE — Discharge Instructions (Signed)
Your labs, xray, and EKG are all reassuring, your chest pain could be a variety of things, however given your concerning history and significant heart risk factors, you need to follow up closely with your cardiologist in order to have further evaluation. You declined being admitted today for this, so it's very important that you see your cardiologist in the next few days for an appointment for further evaluation and work up.  Use tylenol as directed as needed for pain. Stay well hydrated. You may consider using heat to the areas of pain, no more than 20 minutes every hour. Call your cardiologist today or tomorrow to schedule an appointment in the next few days for recheck of symptoms. Return to the ER for changes or worsening symptoms.  SEEK IMMEDIATE MEDICAL ATTENTION IF: You develop a fever.  Your chest pains become severe or intolerable.  You develop new, unexplained symptoms (problems).  You develop shortness of breath, nausea, vomiting, sweating or feel light headed.  You develop a new cough or you cough up blood. You develop new leg swelling

## 2017-08-28 NOTE — ED Notes (Signed)
Patient transported to X-ray 

## 2017-08-28 NOTE — ED Provider Notes (Signed)
[facility] EMERGENCY DEPARTMENT Provider Note   CSN: [id number] Arrival date & time: 08/28/17  1039     History   Chief Complaint Chief Complaint  Patient presents with  . Chest Pain    HPI Daniel [name] is a 51 y.o. 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 He describes the pain as 5/10 constant pressure-like and crampy pain in the left chest, nonradiating, worse with inspiration, and improved with one sublingual nitroglycerin that he took on Monday however he has not taken anything else since then. He reports mild SOB as well. States that this pain feels slightly less severe than when he had his MI last year. His cardiologist is Dr. Clifton James and his PCP is Dr. Milinda Antis. He quit smoking 2 years ago. His mother has a history of DVT, his father has a history of massive MI at 39 years old of which he died. He has multiple uncles on his paternal side who have had MIs, one of whom died at a young age. He has not had another cath since his MI in 03/2016, and hasn't had a repeat Echo since then. Didn't have a stress test either.   He denies diaphoresis, lightheadedness, fevers, chills, cough, LE swelling, recent travel/surgery/immobilization, personal hx of DVT/PE, abd pain, N/V/D/C, hematuria, dysuria, myalgias, arthralgias, claudication, orthopnea, numbness, tingling, focal weakness, or any other complaints at this time.   The history is provided by the patient and medical records. No language interpreter was used.  Chest Pain   This is a new problem. The current episode started 2 days ago. The problem occurs constantly.  The problem has not changed since onset.The pain is associated with exertion. The pain is present in the lateral region. The pain is at a severity of 5/10. The pain is mild. The quality of the pain is described as pressure-like (and crampy). The pain does not radiate. Duration of episode(s) is 2 days. The symptoms are aggravated by deep breathing. Associated symptoms include shortness of breath. Pertinent negatives include no abdominal pain, no claudication, no cough, no diaphoresis, no fever, no lower extremity edema, no nausea, no numbness, no orthopnea, no vomiting and no weakness. He has tried nitroglycerin for the symptoms. The treatment provided moderate relief. Risk factors include male gender and obesity.  His past medical history is significant for CAD, diabetes, hyperlipidemia, hypertension and MI.  Pertinent negatives for past medical history include no DVT and no PE.  His family medical history is significant for CAD and early MI.  Procedure history is positive for cardiac catheterization and echocardiogram.    Past Medical History:  Diagnosis Date  . CAD (coronary artery disease)    a. NSTEMI: 100% stenosis of the distal RCA --> DES placed 5/17 CLEARED BY CARDIOLOGIST  . Chronic low back pain    a. s/p back surgery.  . CVA (cerebral infarction)    a. 02/2015 dyarthria 2/2 CVA involving the lateral aspect of the precentral gyrus.  . DM2 (diabetes mellitus, type 2) (HCC)    a. pre-diabetic in the past. b. A1c 03/2016 elevated to 7.9.  . ETOH abuse   .  GERD (gastroesophageal reflux disease)   . History of echocardiogram    a. Mild LVH, EF 55-60%, Definity contrast used-LV wall motion could not be adequately assessed  . History of non-ST elevation myocardial infarction (NSTEMI) 03/2016   a. PCI: 3.5 x 24 mm Promus Premier DES to distal RCA  . History of tobacco abuse   . Hypertension    a. 02/2015 echo: 55-65%, trace TR/MR. b. 03/2016: echo with EF of 55-60%.   . Obesity   . Ruptured  disk   . Stroke [facility](HCC)    2016    Patient Active Problem List   Diagnosis Date Noted  . Type 2 diabetes mellitus without complications (HCC) 07/22/2017  . Hyperglycemia 04/16/2016  . Elevated transaminase level 04/16/2016  . Coronary artery disease involving native coronary artery of native heart without angina pectoris 03/20/2016  . History of non-ST elevation myocardial infarction (NSTEMI) 03/20/2016  . Dyslipidemia 03/12/2016  . NSTEMI (non-ST elevated myocardial infarction) (HCC) 03/10/2016  . Constipation 02/10/2016  . Prostate cancer screening 02/10/2016  . CVA (cerebral infarction) 03/01/2015  . Right knee pain 04/06/2014  . Benign paroxysmal positional vertigo 03/10/2014  . BPH (benign prostatic hyperplasia) 11/16/2013  . Routine general medical examination at a health care facility 11/12/2013  . Morbid obesity (HCC) 03/02/2011  . B12 deficiency 09/29/2007  . ERECTILE DYSFUNCTION 07/01/2007  . History of tobacco abuse 07/01/2007  . LOW BACK PAIN, CHRONIC 07/01/2007  . HYPERTENSION, BENIGN ESSENTIAL 06/03/2007    Past Surgical History:  Procedure Laterality Date  . BACK SURGERY     ruptured disk, L-S  . CARDIAC CATHETERIZATION  05/2004   minimal CAD  . CARDIAC CATHETERIZATION N/A 03/10/2016   Procedure: Left Heart Cath and Coronary Angiography;  Surgeon: Kathleene Hazelhristopher D McAlhany, MD;  [facility];  Service: Cardiovascular;  Laterality: N/A;  . CARDIAC CATHETERIZATION N/A 03/10/2016   Procedure: Coronary Stent Intervention;  Surgeon: Kathleene Hazelhristopher D McAlhany, MD;  Location: [facility] INVASIVE CV LAB;  Service: Cardiovascular;  Laterality: N/A;  . CATARACT EXTRACTION W/PHACO Left 07/12/2016   Procedure: CATARACT EXTRACTION PHACO AND INTRAOCULAR LENS PLACEMENT (IOC);  Surgeon: Nevada CraneBradley Mark King, MD;  Location: [facility] ORS;  Service: Ophthalmology;  Laterality: Left;  US  00:50 AP% 9.2 CDE 4.63 fluid pack lot # [id number]  . CORONARY ANGIOPLASTY     STENT 5/17        Home Medications    Prior to Admission medications   Medication Sig Start Date End Date Taking? Authorizing Provider  amLODipine (NORVASC) 5 MG tablet TAKE 1 TABLET (5 MG TOTAL) BY MOUTH DAILY. 07/12/17   Tower, Audrie GallusMarne A, MD  aspirin 81 MG tablet Take 81 mg by mouth daily.    [provider]  atorvastatin (LIPITOR) 80 MG tablet TAKE 1 TABLET (80 MG TOTAL) BY MOUTH DAILY AT 6 PM. 06/04/17   Kathleene HazelMcAlhany, Christopher D, MD  carvedilol (COREG) 6.25 MG tablet TAKE 1 TABLET (6.25 MG TOTAL) BY MOUTH 2 (TWO) TIMES DAILY WITH A MEAL. 06/04/17   Kathleene HazelMcAlhany, Christopher D, MD  clopidogrel (PLAVIX) 75 MG tablet Take 1 tablet (75 mg total) by mouth daily. 01/15/17   Leone BrandIngold, Laura R, NP  hydrochlorothiazide (HYDRODIURIL) 25 MG tablet Take 0.5 tablets (12.5 mg total) by mouth daily. 07/12/17   Tower, Audrie GallusMarne A, MD  losartan (COZAAR) 100 MG tablet TAKE 1 TABLET (100 MG TOTAL) BY MOUTH DAILY. 07/12/17   Tower, Audrie GallusMarne A, MD  metFORMIN (GLUCOPHAGE) 500 MG tablet Take 1 tablet (500 mg total)  by mouth 2 (two) times daily with a meal. 07/22/17   Tower, Audrie Gallus, MD  nitroGLYCERIN (NITROSTAT) 0.4 MG SL tablet Place 1 tablet (0.4 mg total) under the tongue every 5 (five) minutes as needed for chest pain (CP or SOB). 03/12/16   Strader, Lennart Pall, PA-C  ranitidine (ZANTAC) 150 MG tablet Take 150 mg by mouth 2 (two) times daily as needed for heartburn.     [provider]  sildenafil (VIAGRA) 50 MG tablet Take 1 tablet (50 mg total) by mouth daily as needed. Take one by mouth once daily as directed as needed 30 minutes before sexual activity. 02/10/16   Tower, Audrie Gallus, MD    Family History Family History  Problem Relation Age of Onset  . Hypertension Father   . Heart disease Father        MI  . Cancer Maternal Grandmother        lung  . Heart failure Paternal Grandfather     Social History Social History  Substance Use Topics  . Smoking status: Former Smoker    Types: Cigarettes    Quit date: 01/18/2015   . Smokeless tobacco: Never Used  . Alcohol use 0.0 oz/week     Comment: about 3 12 packs/wk.     Allergies   Ace inhibitors   Review of Systems Review of Systems  Constitutional: Negative for chills, diaphoresis and fever.  Respiratory: Positive for shortness of breath. Negative for cough.   Cardiovascular: Positive for chest pain. Negative for orthopnea, claudication and leg swelling.  Gastrointestinal: Negative for abdominal pain, constipation, diarrhea, nausea and vomiting.  Genitourinary: Negative for dysuria and hematuria.  Musculoskeletal: Negative for arthralgias and myalgias.  Skin: Negative for color change.  Allergic/Immunologic: Positive for immunocompromised state (DM2).  Neurological: Negative for weakness, light-headedness and numbness.  Psychiatric/Behavioral: Negative for confusion.   All other systems reviewed and are negative for acute change except as noted in the HPI.    Physical Exam Updated Vital Signs BP 110/84 (BP Location: Right Arm)   Pulse 75   Temp 98.4 F (36.9 C) (Oral)   Resp 18   SpO2 93%   Physical Exam  Constitutional: He is oriented to person, place, and time. Vital signs are normal. He appears well-developed and well-nourished.  Non-toxic appearance. No distress.  Afebrile, nontoxic, NAD  HENT:  Head: Normocephalic and atraumatic.  Mouth/Throat: Oropharynx is clear and moist and mucous membranes are normal.  Eyes: Conjunctivae and EOM are normal. Right eye exhibits no discharge. Left eye exhibits no discharge.  Neck: Normal range of motion. Neck supple.  Cardiovascular: Normal rate, regular rhythm, normal heart sounds and intact distal pulses.  Exam reveals no gallop and no friction rub.   No murmur heard. RRR, nl s1/s2, no m/r/g, distal pulses intact, no pedal edema   Pulmonary/Chest: Effort normal and breath sounds normal. No respiratory distress. He has no decreased breath sounds. He has no wheezes. He has no rhonchi. He has no  rales. He exhibits no tenderness, no crepitus, no deformity and no retraction.  CTAB in all lung fields, no w/r/r, no hypoxia or increased WOB, speaking in full sentences, SpO2 93-96% on RA Chest wall nonTTP without crepitus, deformities, or retractions   Abdominal: Soft. Normal appearance and bowel sounds are normal. He exhibits no distension. There is no tenderness. There is no rigidity, no rebound, no guarding, no CVA tenderness, no tenderness at McBurney's point and negative Murphy's sign.  Musculoskeletal: Normal range of motion.  MAE x4 Strength and sensation grossly intact in all extremities Distal pulses intact No pedal edema, neg homan's bilaterally   Neurological: He is alert and oriented to person, place, and time. He has normal strength. No sensory deficit.  Skin: Skin is warm, dry and intact. No rash noted.  Psychiatric: He has a normal mood and affect.  Nursing note and vitals reviewed.    ED Treatments / Results  Labs (all labs ordered are listed, but only abnormal results are displayed) Labs Reviewed  BASIC METABOLIC PANEL - Abnormal; Notable for the following:       Result Value   Glucose, Bld 159 (*)    All other components within normal limits  CBC  D-DIMER, QUANTITATIVE (NOT AT [facility])  I-STAT TROPONIN, ED  I-STAT TROPONIN, ED    EKG  EKG Interpretation  Date/Time:  Wednesday August 28 2017 10:53:34 EDT Ventricular Rate:  72 PR Interval:  150 QRS Duration: 96 QT Interval:  384 QTC Calculation: 420 R Axis:   -20 Text Interpretation:  Normal sinus rhythm No significant change since last tracing Confirmed by Cathren Laine ([id number]) on 08/28/2017 12:12:57 PM       Radiology Dg Chest 2 View  Result Date: 08/28/2017 CLINICAL DATA:  Left chest pressure since Monday. EXAM: CHEST  2 VIEW COMPARISON:  03/11/2016 FINDINGS: Normal heart size and stable mediastinal contours. Stable prominent markings at the left lung base. There is no edema, consolidation,  effusion, or pneumothorax. Spondylosis. No acute osseous finding. IMPRESSION: Stable exam.  No evidence of active disease. Electronically Signed   By: Marnee Spring M.D.   On: 08/28/2017 11:29     [facility] 03/10/16: Conclusion   Prox RCA lesion, 30% stenosed.  Dist RCA lesion, 100% stenosed. Post intervention, there is a 0% residual stenosis.   1. NSTEMI secondary to occluded distal RCA 2. Successful PTCA/DES of the distal RCA 3. No obstructive disease in the LAD or Circumflex 4. Mild LV systolic dysfunction.   Recommendations: Will continue DAPT with ASA and Brilinta. Will continue statin, beta blocker. Echo tomorrow. Admit to stepdown.     Echo 03/11/16: Study Conclusions - Procedure narrative: Transthoracic echocardiography. Image   quality was adequate. The study was technically difficult, as a   result of poor sound wave transmission. Intravenous contrast   (Definity) was administered. - Left ventricle: The cavity size was mildly dilated. Wall   thickness was increased in a pattern of mild LVH. Systolic   function was normal. The estimated ejection fraction was in the   range of 55% to 60%. Images were inadequate for LV wall motion   assessment. The study is not technically sufficient to allow   evaluation of LV diastolic function. - Left atrium: The atrium was normal in size. - Inferior vena cava: The vessel was normal in size. The   respirophasic diameter changes were in the normal range (>= 50%),   consistent with normal central venous pressure.  Impressions: - Technically difficult study with poor echo windows. Definity   contrast was minimally helpful. There appears to be grossly   normal LV systolic function, however, I cannot fully evaluate   wall motion.   Procedures Procedures (including critical care time)  Medications Ordered in ED Medications  nitroGLYCERIN (NITROSTAT) SL tablet 0.4 mg (0.4 mg Sublingual Given 08/28/17 1244)  aspirin chewable tablet 243 mg  (243 mg Oral Given 08/28/17 1206)  gi cocktail (Maalox,Lidocaine,Donnatal) (30 mLs Oral Given 08/28/17 1206)     Initial Impression / Assessment  and Plan / ED Course  I have reviewed the triage vital signs and the nursing notes.  Pertinent labs & imaging results that were available during my care of the patient were reviewed by me and considered in my medical decision making (see chart for details).     51 y.o. 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 Took 1 SL NTG which improved it, but it's continued since 2 days ago. On exam, clear lungs, no reproducible pain, no tachycardia but slight relative hypoxia with SpO2 93-96% on RA, no LE swelling. Initial work up reveals: CXR neg, EKG nonischemic and without acute findings, CBC WNL, BMP WNL aside from glucose 159, troponin negative. Will get D-dimer, and give 243mg  ASA (since pt took 81mg  already), as well as GI cocktail and NTG. Will reassess shortly. Discussed case with my attending Dr. Denton Lank who agrees with plan.   12:31 PM D-dimer negative. Will repeat troponin at 3hr mark from first one; will reassess shortly.   3:23 PM Second troponin negative. Pt feeling slightly better. Given concerning history and risk factors, with c/o exertional chest pain, I recommended that he be admitted for further work up and ACS r/o, however pt adamantly refuses admission and states he wants to go home. R/B/A discussed and he continued to decline admission.  Advised that he should f/up closely with his cardiologist, call tomorrow to get appt ASAP, for further evaluation; he may potentially warrant a stress test; very strict return precautions were given. I explained the diagnosis and have given explicit precautions to return to the ER including for any other new or worsening symptoms. The patient understands and accepts the medical plan as it's been dictated and I have answered their questions. Discharge  instructions concerning home care and prescriptions have been given. The patient is STABLE and is discharged to home in good condition.    Final Clinical Impressions(s) / ED Diagnoses   Final diagnoses:  Left sided chest pain  SOB (shortness of breath)  Type 2 diabetes mellitus with hyperglycemia, without long-term current use of insulin [facility])    New Prescriptions New Prescriptions   No medications on [address] [place], [place] 08/28/17 1524    Cathren Laine, MD 08/28/17 1654

## 2017-09-04 ENCOUNTER — Encounter: Payer: Self-pay | Admitting: Physician Assistant

## 2017-09-04 ENCOUNTER — Ambulatory Visit (INDEPENDENT_AMBULATORY_CARE_PROVIDER_SITE_OTHER): Payer: PRIVATE HEALTH INSURANCE | Admitting: Physician Assistant

## 2017-09-04 VITALS — BP 154/90 | HR 67 | Ht 71.0 in | Wt 281.0 lb

## 2017-09-04 DIAGNOSIS — E119 Type 2 diabetes mellitus without complications: Secondary | ICD-10-CM

## 2017-09-04 DIAGNOSIS — I25119 Atherosclerotic heart disease of native coronary artery with unspecified angina pectoris: Secondary | ICD-10-CM

## 2017-09-04 DIAGNOSIS — I10 Essential (primary) hypertension: Secondary | ICD-10-CM | POA: Diagnosis not present

## 2017-09-04 DIAGNOSIS — R072 Precordial pain: Secondary | ICD-10-CM

## 2017-09-04 NOTE — Patient Instructions (Signed)
Medication Instructions: Azalee CourseHao Meng, PA-C recommends that you continue on your current medications as directed. Please refer to the Current Medication list given to you today.  Labwork: NONE ORDERED  Testing/Procedures: 1. Exercise Myoview 1-Day Study - Your physician has requested that you have en exercise stress myoview. For further information please visit [email or web address]. Please follow instruction sheet, as given.  Follow-up: Wynema BirchHao recommends that you schedule a follow-up appointment in 3-4 months with Dr Clifton JamesMcAlhany. Please cancel your November appointment with him.  If you need a refill on your cardiac medications before your next appointment, please call your pharmacy.

## 2017-09-04 NOTE — Progress Notes (Signed)
Cardiology Office Note    Date:  09/06/2017   ID:  [name], DOB Jul 30, 1966, MRN [id number]  PCP:  Judy Pimple, MD  Cardiologist:  Dr. Clifton James   Chief Complaint  Patient presents with  . Follow-up    seen for Dr. Clifton James    History of Present Illness:  Daniel [name] is a 51 y.o. male with PMH of HTN, DM II, CAD, and prior CVA. He had a history of NSTEMI in May 2017 and received DES to occluded RCA. He had normal LV function. Echocardiogram obtained on 03/11/2016 showed EF 55-60%, mild LVH. He recently went to the ED on 08/28/2017 with left-sided chest pain. Serial troponin was negative. D-dimer was negative. ED physician recommended admission for further workup, however patient refused. He presents today for cardiology evaluation.  Patient presents today for cardiology office evaluation for chest discomfort. He continued to have intermittent left-sided chest soreness but it is better since the hospital visit. He says he went back to his usual activity after his hospital release. I pressed on the area, this did not elicit any pain. He says he may notice a little bit increased discomfort when he rotates the body, however chest palpation does not increase the discomfort, nor does the deep inspiration. He says the location and the characteristic of the pain is very similar to the previous angina in 2017, however it is not as intense. I recommended a treadmill Myoview for further assessment. He has a visit with Dr. Clifton James next week, I will delay this for 3-4 months instead as long as his myoview is normal.   Past Medical History:  Diagnosis Date  . CAD (coronary artery disease)    a. NSTEMI: 100% stenosis of the distal RCA --> DES placed 5/17 CLEARED BY CARDIOLOGIST  . Chronic low back pain    a. s/p back surgery.  . CVA (cerebral infarction)    a. 02/2015 dyarthria 2/2 CVA involving the lateral aspect of the precentral gyrus.  . DM2 (diabetes mellitus, type 2) (HCC)    a.  pre-diabetic in the past. b. A1c 03/2016 elevated to 7.9.  . ETOH abuse   . GERD (gastroesophageal reflux disease)   . History of echocardiogram    a. Mild LVH, EF 55-60%, Definity contrast used-LV wall motion could not be adequately assessed  . History of non-ST elevation myocardial infarction (NSTEMI) 03/2016   a. PCI: 3.5 x 24 mm Promus Premier DES to distal RCA  . History of tobacco abuse   . Hypertension    a. 02/2015 echo: 55-65%, trace TR/MR. b. 03/2016: echo with EF of 55-60%.   . Obesity   . Ruptured disk   . Stroke [facility])    2016    Past Surgical History:  Procedure Laterality Date  . BACK SURGERY     ruptured disk, L-S  . CARDIAC CATHETERIZATION  05/2004   minimal CAD  . CARDIAC CATHETERIZATION N/A 03/10/2016   Procedure: Left Heart Cath and Coronary Angiography;  Surgeon: Kathleene Hazel, MD;  Location: [facility] INVASIVE CV LAB;  Service: Cardiovascular;  Laterality: N/A;  . CARDIAC CATHETERIZATION N/A 03/10/2016   Procedure: Coronary Stent Intervention;  Surgeon: Kathleene Hazel, MD;  Location: [facility] INVASIVE CV LAB;  Service: Cardiovascular;  Laterality: N/A;  . CATARACT EXTRACTION W/PHACO Left 07/12/2016   Procedure: CATARACT EXTRACTION PHACO AND INTRAOCULAR LENS PLACEMENT (IOC);  Surgeon: Nevada Crane, MD;  Location: [facility] ORS;  Service: Ophthalmology;  Laterality: Left;  [place]  00:50 AP% 9.2 CDE 4.63 fluid pack lot # [id number]  . CORONARY ANGIOPLASTY     STENT 5/17    Current Medications: Outpatient Medications Prior to Visit  Medication Sig Dispense Refill  . amLODipine (NORVASC) 5 MG tablet TAKE 1 TABLET (5 MG TOTAL) BY MOUTH DAILY. 30 tablet 11  . aspirin 81 MG tablet Take 81 mg by mouth daily.    Marland Kitchen atorvastatin (LIPITOR) 80 MG tablet TAKE 1 TABLET (80 MG TOTAL) BY MOUTH DAILY AT 6 PM. 30 tablet 7  . carvedilol (COREG) 6.25 MG tablet TAKE 1 TABLET (6.25 MG TOTAL) BY MOUTH 2 (TWO) TIMES DAILY WITH A MEAL. 60 tablet 7  . cholecalciferol (VITAMIN D) 1000 units  tablet Take 1,000 Units by mouth daily.    . clopidogrel (PLAVIX) 75 MG tablet Take 1 tablet (75 mg total) by mouth daily. 34 tablet 11  . hydrochlorothiazide (HYDRODIURIL) 25 MG tablet Take 0.5 tablets (12.5 mg total) by mouth daily. 30 tablet 11  . losartan (COZAAR) 100 MG tablet TAKE 1 TABLET (100 MG TOTAL) BY MOUTH DAILY. 30 tablet 11  . metFORMIN (GLUCOPHAGE) 500 MG tablet Take 1 tablet (500 mg total) by mouth 2 (two) times daily with a meal. 60 tablet 11  . nitroGLYCERIN (NITROSTAT) 0.4 MG SL tablet Place 1 tablet (0.4 mg total) under the tongue every 5 (five) minutes as needed for chest pain (CP or SOB). 25 tablet 3  . sildenafil (VIAGRA) 50 MG tablet Take 1 tablet (50 mg total) by mouth daily as needed. Take one by mouth once daily as directed as needed 30 minutes before sexual activity. 10 tablet 11  . thiamine (VITAMIN B-1) 100 MG tablet Take 100 mg by mouth daily.     No facility-administered medications prior to visit.      Allergies:   Ace inhibitors   Social History   Social History  . Marital status: Married    Spouse name: N/A  . Number of children: 1  . Years of education: N/A   Occupational History  . Haematologist    Social History Main Topics  . Smoking status: Former Smoker    Types: Cigarettes    Quit date: 01/18/2015  . Smokeless tobacco: Never Used  . Alcohol use 0.0 oz/week     Comment: about 3 12 packs/wk.  . Drug use: No  . Sexual activity: Yes    Partners: Female   Other Topics Concern  . None   Social History Narrative   Lives in [place] with wife.  Does not routinely exercise.     Family History:  The patient's family history includes Cancer in his maternal grandmother; Heart disease in his father; Heart failure in his paternal grandfather; Hypertension in his father.   ROS:   Please see the history of present illness.    ROS All other systems reviewed and are negative.   PHYSICAL EXAM:   VS:  BP (!) 154/90   Pulse 67   Ht 5\' 11"  (1.803  m)   Wt 281 lb (127.5 kg)   SpO2 95%   BMI 39.19 kg/m    GEN: Well nourished, well developed, in no acute distress  HEENT: normal  Neck: no JVD, carotid bruits, or masses Cardiac: RRR; no murmurs, rubs, or gallops,no edema  Respiratory:  clear to auscultation bilaterally, normal work of breathing GI: soft, nontender, nondistended, + BS MS: no deformity or atrophy  Skin: warm and dry, no rash Neuro:  Alert and Oriented x  3, Strength and sensation are intact Psych: euthymic mood, full affect  Wt Readings from Last 3 Encounters:  09/04/17 281 lb (127.5 kg)  07/22/17 279 lb (126.6 kg)  07/12/17 282 lb 4 oz (128 kg)      Studies/Labs Reviewed:   EKG:  EKG is not ordered today.    Recent Labs: 07/12/2017: ALT 50; TSH 1.33 08/28/2017: BUN 18; Creatinine, Ser 1.01; Hemoglobin 15.2; Platelets 178; Potassium 4.3; Sodium 136   Lipid Panel    Component Value Date/Time   CHOL 93 07/12/2017 1045   CHOL 150 02/27/2015 0645   TRIG 52.0 07/12/2017 1045   TRIG 98 02/27/2015 0645   HDL 34.70 (L) 07/12/2017 1045   HDL 34 (L) 02/27/2015 0645   CHOLHDL 3 07/12/2017 1045   VLDL 10.4 07/12/2017 1045   VLDL 20 02/27/2015 0645   LDLCALC 48 07/12/2017 1045   LDLCALC 96 02/27/2015 0645    Additional studies/ records that were reviewed today include:   Cath 03/10/2016 Conclusion    Prox RCA lesion, 30% stenosed.  Dist RCA lesion, 100% stenosed. Post intervention, there is a 0% residual stenosis.   1. NSTEMI secondary to occluded distal RCA 2. Successful PTCA/DES of the distal RCA 3. No obstructive disease in the LAD or Circumflex 4. Mild LV systolic dysfunction.   Recommendations: Will continue DAPT with ASA and Brilinta. Will continue statin, beta blocker. Echo tomorrow. Admit to stepdown.        Echo 03/11/2016 LV EF: 55% -   60%  Study Conclusions  - Procedure narrative: Transthoracic echocardiography. Image   quality was adequate. The study was technically difficult, as  a   result of poor sound wave transmission. Intravenous contrast   (Definity) was administered. - Left ventricle: The cavity size was mildly dilated. Wall   thickness was increased in a pattern of mild LVH. Systolic   function was normal. The estimated ejection fraction was in the   range of 55% to 60%. Images were inadequate for LV wall motion   assessment. The study is not technically sufficient to allow   evaluation of LV diastolic function. - Left atrium: The atrium was normal in size. - Inferior vena cava: The vessel was normal in size. The   respirophasic diameter changes were in the normal range (>= 50%),   consistent with normal central venous pressure.  Impressions:  - Technically difficult study with poor echo windows. Definity   contrast was minimally helpful. There appears to be grossly   normal LV systolic function, however, I cannot fully evaluate   wall motion.   ASSESSMENT:    1. Precordial chest pain   2. Coronary artery disease involving native coronary artery of native heart with angina pectoris (HCC)   3. Essential hypertension   4. Controlled type 2 diabetes mellitus without complication, without long-term current use of insulin (HCC)      PLAN:  In order of problems listed above:  1. CAD: History of PCI to RCA in 2017. Recently, he has been having intermittent chest discomfort, the characteristic of the chest discomfort is very similar to the previous angina, however does not seem to occur with exertion. I recommended a treadmill Myoview to further assess. Cholesterol is very well controlled other than chronically low HDL.  2. Hypertension: Blood pressure mildly elevated today, however normally it is quite normal. Will not treat a single elevated blood pressure.  3. DM 2: Will defer management to primary care provider.    Medication Adjustments/Labs and  Tests Ordered: Current medicines are reviewed at length with the patient today.  Concerns  regarding medicines are outlined above.  Medication changes, Labs and Tests ordered today are listed in the Patient Instructions below. Patient Instructions  Medication Instructions: Azalee Course , PA-C recommends that you continue on your current medications as directed. Please refer to the Current Medication list given to you today.  Labwork: NONE ORDERED  Testing/Procedures: 1. Exercise Myoview 1-Day Study - Your physician has requested that you have en exercise stress myoview. For further information please visit [email or web address]. Please follow instruction sheet, as given.  Follow-up: Wynema Birch recommends that you schedule a follow-up appointment in 3-4 months with Dr Clifton JamesMcAlhany. Please cancel your November appointment with him.  If you need a refill on your cardiac medications before your next appointment, please call your pharmacy.    Ramond DialSigned,  , [place]PA  09/06/2017 4:22 AM    [facility] [address], [place], [place]  [address] Phone: [phone]([phone]; Fax: [phone]) [phone]

## 2017-09-06 ENCOUNTER — Encounter: Payer: Self-pay | Admitting: Physician Assistant

## 2017-09-09 ENCOUNTER — Ambulatory Visit: Payer: PRIVATE HEALTH INSURANCE | Admitting: Cardiovascular Disease

## 2017-09-18 ENCOUNTER — Inpatient Hospital Stay (HOSPITAL_COMMUNITY): Admission: RE | Admit: 2017-09-18 | Payer: PRIVATE HEALTH INSURANCE | Source: Ambulatory Visit

## 2017-10-21 ENCOUNTER — Other Ambulatory Visit: Payer: PRIVATE HEALTH INSURANCE

## 2017-10-23 ENCOUNTER — Ambulatory Visit: Payer: PRIVATE HEALTH INSURANCE | Admitting: Family Medicine

## 2017-12-16 ENCOUNTER — Ambulatory Visit (INDEPENDENT_AMBULATORY_CARE_PROVIDER_SITE_OTHER): Payer: BLUE CROSS/BLUE SHIELD | Admitting: Cardiovascular Disease

## 2017-12-16 ENCOUNTER — Encounter: Payer: Self-pay | Admitting: Cardiovascular Disease

## 2017-12-16 ENCOUNTER — Encounter (INDEPENDENT_AMBULATORY_CARE_PROVIDER_SITE_OTHER): Payer: Self-pay

## 2017-12-16 VITALS — BP 110/70 | HR 65 | Ht 71.0 in | Wt 281.8 lb

## 2017-12-16 DIAGNOSIS — I10 Essential (primary) hypertension: Secondary | ICD-10-CM | POA: Diagnosis not present

## 2017-12-16 DIAGNOSIS — I2510 Atherosclerotic heart disease of native coronary artery without angina pectoris: Secondary | ICD-10-CM

## 2017-12-16 DIAGNOSIS — E7800 Pure hypercholesterolemia, unspecified: Secondary | ICD-10-CM

## 2017-12-16 NOTE — Patient Instructions (Signed)

## 2017-12-16 NOTE — Progress Notes (Signed)
Chief Complaint  Patient presents with  . Coronary Artery Disease    History of Present Illness: 52 yo male with history of CAD, DM, HTN, prior CVA here today for cardiac follow up. He was admitted to [facility] May 2017 with a NSTEMI. His distal RCA was totally occluded. A Promus DES was placed in the distal RCA. Echo with normal LV function. Episode of chest pain October 2018 and was seen in the ED with negative workup. Pt cancelled outpatient stress test in November 2018.   He is here today for follow up. The patient denies any chest pain, dyspnea, palpitations, lower extremity edema, orthopnea, PND, dizziness, near syncope or syncope.   Primary Care Physician: Tower, Audrie Gallus, MD  Past Medical History:  Diagnosis Date  . CAD (coronary artery disease)    a. NSTEMI: 100% stenosis of the distal RCA --> DES placed 5/17 CLEARED BY CARDIOLOGIST  . Chronic low back pain    a. s/p back surgery.  . CVA (cerebral infarction)    a. 02/2015 dyarthria 2/2 CVA involving the lateral aspect of the precentral gyrus.  . DM2 (diabetes mellitus, type 2) (HCC)    a. pre-diabetic in the past. b. A1c 03/2016 elevated to 7.9.  . ETOH abuse   . GERD (gastroesophageal reflux disease)   . History of echocardiogram    a. Mild LVH, EF 55-60%, Definity contrast used-LV wall motion could not be adequately assessed  . History of non-ST elevation myocardial infarction (NSTEMI) 03/2016   a. PCI: 3.5 x 24 mm Promus Premier DES to distal RCA  . History of tobacco abuse   . Hypertension    a. 02/2015 echo: 55-65%, trace TR/MR. b. 03/2016: echo with EF of 55-60%.   . Obesity   . Ruptured disk   . Stroke [facility])    2016    Past Surgical History:  Procedure Laterality Date  . BACK SURGERY     ruptured disk, L-S  . CARDIAC CATHETERIZATION  05/2004   minimal CAD  . CARDIAC CATHETERIZATION N/A 03/10/2016   Procedure: Left Heart Cath and Coronary Angiography;  Surgeon: Kathleene Hazel, MD;  Location: [facility] INVASIVE CV  LAB;  Service: Cardiovascular;  Laterality: N/A;  . CARDIAC CATHETERIZATION N/A 03/10/2016   Procedure: Coronary Stent Intervention;  Surgeon: Kathleene Hazel, MD;  Location: [facility] INVASIVE CV LAB;  Service: Cardiovascular;  Laterality: N/A;  . CATARACT EXTRACTION W/PHACO Left 07/12/2016   Procedure: CATARACT EXTRACTION PHACO AND INTRAOCULAR LENS PLACEMENT (IOC);  Surgeon: Nevada Crane, MD;  Location: [facility] ORS;  Service: Ophthalmology;  Laterality: Left;  [place]  00:50 AP% 9.2 CDE 4.63 fluid pack lot # [id number]  . CORONARY ANGIOPLASTY     STENT 5/17    Current Outpatient Medications  Medication Sig Dispense Refill  . amLODipine (NORVASC) 5 MG tablet TAKE 1 TABLET (5 MG TOTAL) BY MOUTH DAILY. 30 tablet 11  . aspirin 81 MG tablet Take 81 mg by mouth daily.    Marland Kitchen atorvastatin (LIPITOR) 80 MG tablet TAKE 1 TABLET (80 MG TOTAL) BY MOUTH DAILY AT 6 PM. 30 tablet 7  . carvedilol (COREG) 6.25 MG tablet TAKE 1 TABLET (6.25 MG TOTAL) BY MOUTH 2 (TWO) TIMES DAILY WITH A MEAL. 60 tablet 7  . cholecalciferol (VITAMIN D) 1000 units tablet Take 1,000 Units by mouth daily.    . clopidogrel (PLAVIX) 75 MG tablet Take 1 tablet (75 mg total) by mouth daily. 34 tablet 11  . hydrochlorothiazide (HYDRODIURIL) 25 MG  tablet Take 0.5 tablets (12.5 mg total) by mouth daily. 30 tablet 11  . losartan (COZAAR) 100 MG tablet TAKE 1 TABLET (100 MG TOTAL) BY MOUTH DAILY. 30 tablet 11  . metFORMIN (GLUCOPHAGE) 500 MG tablet Take 1 tablet (500 mg total) by mouth 2 (two) times daily with a meal. 60 tablet 11  . nitroGLYCERIN (NITROSTAT) 0.4 MG SL tablet Place 1 tablet (0.4 mg total) under the tongue every 5 (five) minutes as needed for chest pain (CP or SOB). 25 tablet 3  . sildenafil (VIAGRA) 50 MG tablet Take 1 tablet (50 mg total) by mouth daily as needed. Take one by mouth once daily as directed as needed 30 minutes before sexual activity. 10 tablet 11  . thiamine (VITAMIN B-1) 100 MG tablet Take 100 mg by mouth daily.       No current facility-administered medications for this visit.     Allergies  Allergen Reactions  . Ace Inhibitors Other (See Comments)    REACTION: cough    Social History   Socioeconomic History  . Marital status: Married    Spouse name: Not on file  . Number of children: 1  . Years of education: Not on file  . Highest education level: Not on file  Social Needs  . Financial resource strain: Not on file  . Food insecurity - worry: Not on file  . Food insecurity - inability: Not on file  . Transportation needs - medical: Not on file  . Transportation needs - non-medical: Not on file  Occupational History  . Occupation: Haematologist  Tobacco Use  . Smoking status: Former Smoker    Types: Cigarettes    Last attempt to quit: 01/18/2015    Years since quitting: 2.9  . Smokeless tobacco: Never Used  Substance and Sexual Activity  . Alcohol use: Yes    Alcohol/week: 0.0 oz    Comment: about 3 12 packs/wk.  . Drug use: No  . Sexual activity: Yes    Partners: Female  Other Topics Concern  . Not on file  Social History Narrative   Lives in [place] with wife.  Does not routinely exercise.    Family History  Problem Relation Age of Onset  . Hypertension Father   . Heart disease Father        MI  . Cancer Maternal Grandmother        lung  . Heart failure Paternal Grandfather     Review of Systems:  As stated in the HPI and otherwise negative.   BP 110/70   Pulse 65   Ht 5\' 11"  (1.803 m)   Wt 281 lb 12.8 oz (127.8 kg)   SpO2 95%   BMI 39.30 kg/m   Physical Examination:  General: Well developed, well nourished, NAD  HEENT: OP clear, mucus membranes moist  SKIN: warm, dry. No rashes. Neuro: No focal deficits  Musculoskeletal: Muscle strength 5/5 all ext  Psychiatric: Mood and affect normal  Neck: No JVD, no carotid bruits, no thyromegaly, no lymphadenopathy.  Lungs:Clear bilaterally, no wheezes, rhonci, crackles Cardiovascular: Regular rate and rhythm. No  murmurs, gallops or rubs. Abdomen:Soft. Bowel sounds present. Non-tender.  Extremities: No lower extremity edema. Pulses are 2 + in the bilateral DP/PT.  Cardiac cath May 2017: Conclusion    Prox RCA lesion, 30% stenosed.  Dist RCA lesion, 100% stenosed. Post intervention, there is a 0% residual stenosis.   1. NSTEMI secondary to occluded distal RCA 2. Successful PTCA/DES of the distal RCA  3. No obstructive disease in the LAD or Circumflex 4. Mild LV systolic dysfunction.    Echo May 2017: Procedure narrative: Transthoracic echocardiography. Image   quality was adequate. The study was technically difficult, as a   result of poor sound wave transmission. Intravenous contrast   (Definity) was administered. - Left ventricle: The cavity size was mildly dilated. Wall   thickness was increased in a pattern of mild LVH. Systolic   function was normal. The estimated ejection fraction was in the   range of 55% to 60%. Images were inadequate for LV wall motion   assessment. The study is not technically sufficient to allow   evaluation of LV diastolic function. - Left atrium: The atrium was normal in size. - Inferior vena cava: The vessel was normal in size. The   respirophasic diameter changes were in the normal range (>= 50%),   consistent with normal central venous pressure.  EKG:  EKG is not  ordered today. The ekg ordered today demonstrates   Recent Labs: 07/12/2017: ALT 50; TSH 1.33 08/28/2017: BUN 18; Creatinine, Ser 1.01; Hemoglobin 15.2; Platelets 178; Potassium 4.3; Sodium 136   Lipid Panel    Component Value Date/Time   CHOL 93 07/12/2017 1045   CHOL 150 02/27/2015 0645   TRIG 52.0 07/12/2017 1045   TRIG 98 02/27/2015 0645   HDL 34.70 (L) 07/12/2017 1045   HDL 34 (L) 02/27/2015 0645   CHOLHDL 3 07/12/2017 1045   VLDL 10.4 07/12/2017 1045   VLDL 20 02/27/2015 0645   LDLCALC 48 07/12/2017 1045   LDLCALC 96 02/27/2015 0645     Wt Readings from Last 3 Encounters:    12/16/17 281 lb 12.8 oz (127.8 kg)  09/04/17 281 lb (127.5 kg)  07/22/17 279 lb (126.6 kg)     Other studies Reviewed: Additional studies/ records that were reviewed today include: . Review of the above records demonstrates:   Assessment and Plan:   1. CAD without angina: He is having no chest pain suggestive of angina. Will continue ASA, Plavix, beta blocker and statin.   2. HTN: BP is well controlled. No changes.   3. HLD: Lipids are well controlled. Continue statin.     Current medicines are reviewed at length with the patient today.  The patient does not have concerns regarding medicines.  The following changes have been made:  no change  Labs/ tests ordered today include:  No orders of the defined types were placed in this encounter.    Disposition:   FU with me in 6 months   Signed, Verne Carrowhristopher , MD 12/16/2017 9:37 AM    [facility] [address], [place], [place]  [address] Phone: [phone]([phone]; Fax: [phone](336) [phone]

## 2018-01-30 ENCOUNTER — Ambulatory Visit: Payer: BLUE CROSS/BLUE SHIELD | Admitting: Family Medicine

## 2018-01-30 ENCOUNTER — Encounter: Payer: Self-pay | Admitting: Family Medicine

## 2018-01-30 DIAGNOSIS — E119 Type 2 diabetes mellitus without complications: Secondary | ICD-10-CM | POA: Diagnosis not present

## 2018-01-30 LAB — BASIC METABOLIC PANEL
BUN: 18 mg/dL (ref 6–23)
CO2: 31 meq/L (ref 19–32)
Calcium: 9.5 mg/dL (ref 8.4–10.5)
Chloride: 99 mEq/L (ref 96–112)
Creatinine, Ser: 1.08 mg/dL (ref 0.40–1.50)
GFR: 75.75 mL/min (ref >60.00)
GLUCOSE: 190 mg/dL — AB (ref 70–99)
POTASSIUM: 4.3 meq/L (ref 3.5–5.1)
Sodium: 137 mEq/L (ref 135–145)

## 2018-01-30 LAB — HEMOGLOBIN A1C: Hgb A1c MFr Bld: 8.1 % — ABNORMAL HIGH (ref 4.6–6.5)

## 2018-01-30 NOTE — Progress Notes (Signed)
DM2.  On metformin 500mg  BID.  Sugar has been up to 170s, then up to 400 recently, that was after eating (2 days ago).  He had pancake and syrup before that elevated sugar reading.  He had been having better numbers until the pancake episode.  Not thirsty.  No polyuria.  He feels better now.  He has been drinking plenty of fluid in the meantime.  Sugar is lower after walking on treadmill.  No CP, SOB, BLE edema.    D/w pt about diet and DM2 path/phys.    Meds, vitals, and allergies reviewed.   ROS: Per HPI unless specifically indicated in ROS section   GEN: nad, alert and oriented HEENT: mucous membranes moist NECK: supple w/o LA CV: rrr. PULM: ctab, no inc wob ABD: soft, +bs EXT: no edema

## 2018-01-30 NOTE — Patient Instructions (Signed)
Go to the lab on the way out.  We'll contact you with your lab report. Use the eat right diet.  Update us in a few days about your sugars.  Drink plenty of water in the meantime.  Take care.  Glad to see you.

## 2018-01-31 NOTE — Assessment & Plan Note (Signed)
D/w pt about diet and DM2 path/phys.   Handout given regarding diet.  His primary doctor already had orders in the EMR for labs.  Reasonable to get those done.  He does not appear to need emergent intervention otherwise.  See notes on labs. >15 minutes spent in face to face time with patient, >50% spent in counselling or coordination of care

## 2018-02-02 ENCOUNTER — Other Ambulatory Visit: Payer: Self-pay | Admitting: Family Medicine

## 2018-02-02 MED ORDER — METFORMIN HCL 500 MG PO TABS: 500.0000 mg | ORAL_TABLET | Freq: Two times a day (BID) | ORAL | 2 refills | Status: DC

## 2018-02-04 ENCOUNTER — Telehealth: Payer: Self-pay | Admitting: Family Medicine

## 2018-02-04 DIAGNOSIS — I10 Essential (primary) hypertension: Secondary | ICD-10-CM

## 2018-02-04 DIAGNOSIS — E119 Type 2 diabetes mellitus without complications: Secondary | ICD-10-CM

## 2018-02-04 DIAGNOSIS — E785 Hyperlipidemia, unspecified: Secondary | ICD-10-CM

## 2018-02-04 NOTE — Telephone Encounter (Addendum)
Unable to reach pt at any contact #. Dr Milinda Antisower after reviewing 01/30/18 lab results would you please order whatever lab testing you want so we can let pt know if he needs to fast or not. Pt has lab appt scheduled on 05/12/18 and f/u appt with Dr Milinda Antisower on 05/13/18. Thank you.

## 2018-02-04 NOTE — Telephone Encounter (Signed)
Left message on vm per dpr to be fasting for his labs.

## 2018-02-04 NOTE — Telephone Encounter (Signed)
Attempted to contact pt to make him aware that his lab appointment is Monday 05/12/18 at 0815; left message on voice mail; pt also instructed to disregard the message on his cell phone voice mail of [phone]; will route to office for notification of this encounter; spoke with Rena.

## 2018-02-04 NOTE — Telephone Encounter (Signed)
I ordered the labs Thanks  Fasting is optimal if able because I added lipids

## 2018-02-04 NOTE — Addendum Note (Signed)
Addended by: Roxy MannsWER, MARNE A on: 02/04/2018 12:42 PM   Modules accepted: Orders

## 2018-02-08 ENCOUNTER — Other Ambulatory Visit: Payer: Self-pay | Admitting: Cardiovascular Disease

## 2018-02-10 ENCOUNTER — Other Ambulatory Visit: Payer: BLUE CROSS/BLUE SHIELD

## 2018-05-12 ENCOUNTER — Other Ambulatory Visit: Payer: BLUE CROSS/BLUE SHIELD

## 2018-05-13 ENCOUNTER — Ambulatory Visit: Payer: BLUE CROSS/BLUE SHIELD | Admitting: Family Medicine

## 2018-06-27 ENCOUNTER — Other Ambulatory Visit (INDEPENDENT_AMBULATORY_CARE_PROVIDER_SITE_OTHER): Payer: BLUE CROSS/BLUE SHIELD

## 2018-06-27 ENCOUNTER — Encounter: Payer: Self-pay | Admitting: Family Medicine

## 2018-06-27 DIAGNOSIS — E119 Type 2 diabetes mellitus without complications: Secondary | ICD-10-CM | POA: Diagnosis not present

## 2018-06-27 DIAGNOSIS — E785 Hyperlipidemia, unspecified: Secondary | ICD-10-CM | POA: Diagnosis not present

## 2018-06-27 DIAGNOSIS — I10 Essential (primary) hypertension: Secondary | ICD-10-CM | POA: Diagnosis not present

## 2018-06-27 LAB — LIPID PANEL
Cholesterol: 84 mg/dL (ref 0–200)
HDL: 34.5 mg/dL — ABNORMAL LOW (ref >39.00)
LDL Cholesterol: 38 mg/dL (ref 0–99)
NONHDL: 49.08
Total CHOL/HDL Ratio: 2
Triglycerides: 55 mg/dL (ref 0.0–149.0)
VLDL: 11 mg/dL (ref 0.0–40.0)

## 2018-06-27 LAB — COMPREHENSIVE METABOLIC PANEL
ALT: 62 U/L — AB (ref 0–53)
AST: 31 U/L (ref 0–37)
Albumin: 4.5 g/dL (ref 3.5–5.2)
Alkaline Phosphatase: 94 U/L (ref 39–117)
BUN: 16 mg/dL (ref 6–23)
CHLORIDE: 102 meq/L (ref 96–112)
CO2: 31 meq/L (ref 19–32)
CREATININE: 1.01 mg/dL (ref 0.40–1.50)
Calcium: 9.9 mg/dL (ref 8.4–10.5)
GFR: 81.72 mL/min (ref >60.00)
GLUCOSE: 142 mg/dL — AB (ref 70–99)
Potassium: 4.3 mEq/L (ref 3.5–5.1)
SODIUM: 139 meq/L (ref 135–145)
Total Bilirubin: 0.8 mg/dL (ref 0.2–1.2)
Total Protein: 7.5 g/dL (ref 6.0–8.3)

## 2018-06-27 LAB — HEMOGLOBIN A1C: HEMOGLOBIN A1C: 7.5 % — AB (ref 4.6–6.5)

## 2018-06-30 ENCOUNTER — Ambulatory Visit: Payer: BLUE CROSS/BLUE SHIELD | Admitting: Family Medicine

## 2018-06-30 ENCOUNTER — Encounter: Payer: Self-pay | Admitting: Family Medicine

## 2018-06-30 VITALS — BP 132/76 | HR 62 | Temp 97.9°F | Ht 71.0 in | Wt 280.8 lb

## 2018-06-30 DIAGNOSIS — E119 Type 2 diabetes mellitus without complications: Secondary | ICD-10-CM | POA: Diagnosis not present

## 2018-06-30 DIAGNOSIS — E669 Obesity, unspecified: Secondary | ICD-10-CM

## 2018-06-30 DIAGNOSIS — I10 Essential (primary) hypertension: Secondary | ICD-10-CM

## 2018-06-30 DIAGNOSIS — R7401 Elevation of levels of liver transaminase levels: Secondary | ICD-10-CM

## 2018-06-30 DIAGNOSIS — E1165 Type 2 diabetes mellitus with hyperglycemia: Secondary | ICD-10-CM

## 2018-06-30 DIAGNOSIS — E785 Hyperlipidemia, unspecified: Secondary | ICD-10-CM

## 2018-06-30 DIAGNOSIS — R740 Nonspecific elevation of levels of transaminase and lactic acid dehydrogenase [LDH]: Secondary | ICD-10-CM

## 2018-06-30 MED ORDER — METFORMIN HCL 1000 MG PO TABS: 1000.0000 mg | ORAL_TABLET | Freq: Two times a day (BID) | ORAL | 11 refills | Status: DC

## 2018-06-30 NOTE — Assessment & Plan Note (Signed)
Discussed how this problem influences overall health and the risks it imposes  Reviewed plan for weight loss with lower calorie diet (via better food choices and also portion control or program like weight watchers) and exercise building up to or more than 30 minutes 5 days per week including some aerobic activity  No time for self care currently

## 2018-06-30 NOTE — Progress Notes (Signed)
Subjective:    Patient ID: Daniel [name], male    DOB: 1966/01/14, 52 y.o.   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   Wt Readings from Last 3 Encounters:  06/30/18 280 lb 12.8 oz (127.4 kg)  01/30/18 273 lb 12 oz (124.2 kg)  12/16/17 281 lb 12.8 oz (127.8 kg)  cannot do diabetic teaching  39.16 kg/m   Pt was seen 9/18 and lost to f/u for DM Saw Dr Para March 3/19- with high bs after eating pancakes  bp is stable today (in the setting of CAD and CVA in the past)  No cp or palpitations or headaches or edema  No side effects to medicines  BP Readings from Last 3 Encounters:  06/30/18 132/76  01/30/18 122/64  12/16/17 110/70    On amlodipine and carvedilol and hctz and losartan   DM2 Lab Results  Component Value Date   HGBA1C 7.5 (H) 06/27/2018  he works 11 hours per day - tries to use the treadmill after work  Cut way back on bread and potatoes  This is improved from 8.1   Taking metformin bid 500 mg (no side effects or diarrhea)  No time for DM teaching  No time to eat out  Needs a yearly diabetic eye exam     Liver tests  Lab Results  Component Value Date   ALT 62 (H) 06/27/2018   AST 31 06/27/2018   ALKPHOS 94 06/27/2018   BILITOT 0.8 06/27/2018     Cholesterol  Lab Results  Component Value Date   CHOL 84 06/27/2018   CHOL 93 07/12/2017   CHOL 89 04/13/2016   Lab Results  Component Value Date   HDL 34.50 (L) 06/27/2018   HDL 34.70 (L) 07/12/2017   HDL 33.50 (L) 04/13/2016   Lab Results  Component Value Date   LDLCALC 38 06/27/2018   LDLCALC 48 07/12/2017   LDLCALC 44 04/13/2016   Lab Results  Component Value Date   TRIG 55.0 06/27/2018   TRIG 52.0 07/12/2017   TRIG 57.0 04/13/2016   Lab Results  Component Value Date   CHOLHDL 2 06/27/2018   CHOLHDL 3 07/12/2017   CHOLHDL 3 04/13/2016   No results found for: LDLDIRECT lipitor 80 mg    Patient Active Problem List   Diagnosis Date Noted  . Elevated ALT measurement 06/30/2018  . Type 2 diabetes mellitus with hyperglycemia, without long-term current use of insulin (HCC) 07/22/2017  . Hyperglycemia 04/16/2016  . Elevated transaminase level 04/16/2016  . Coronary artery disease involving native coronary artery of native heart without angina pectoris 03/20/2016  . History of non-ST elevation myocardial infarction (NSTEMI) 03/20/2016  . Dyslipidemia 03/12/2016  . NSTEMI (non-ST elevated myocardial infarction) (HCC) 03/10/2016  . Constipation 02/10/2016  . Prostate cancer screening 02/10/2016  . CVA (cerebral infarction) 03/01/2015  . Right knee pain 04/06/2014  . Benign paroxysmal positional vertigo 03/10/2014  . BPH (benign prostatic hyperplasia) 11/16/2013  . Routine general medical examination at a health care facility 11/12/2013  . Obesity (BMI 30-39.9) 03/02/2011  . B12 deficiency 09/29/2007  . ERECTILE DYSFUNCTION 07/01/2007  . History of tobacco abuse 07/01/2007  . LOW BACK PAIN, CHRONIC 07/01/2007  . HYPERTENSION, BENIGN ESSENTIAL 06/03/2007   Past Medical History:  Diagnosis Date  . CAD (coronary artery disease)    a. NSTEMI: 100% stenosis of the distal RCA --> DES placed 5/17 CLEARED BY CARDIOLOGIST  .  Chronic low back pain    a. s/p back surgery.  . CVA (cerebral infarction)    a. 02/2015 dyarthria 2/2 CVA involving the lateral aspect of the precentral gyrus.  . DM2 (diabetes mellitus, type 2) (HCC)    a. pre-diabetic in the past. b. A1c 03/2016 elevated to 7.9.  . ETOH abuse   . GERD (gastroesophageal reflux disease)   . History of echocardiogram    a. Mild LVH, EF 55-60%, Definity contrast used-LV wall motion could not be adequately assessed  . History of non-ST elevation myocardial infarction (NSTEMI) 03/2016   a. PCI: 3.5 x 24 mm Promus Premier DES to distal RCA  . History of tobacco abuse   . Hypertension    a. 02/2015 echo: 55-65%, trace TR/MR. b. 03/2016: echo with EF of 55-60%.    . Obesity   . Ruptured disk   . Stroke [facility](HCC)    2016   Past Surgical History:  Procedure Laterality Date  . BACK SURGERY     ruptured disk, L-S  . CARDIAC CATHETERIZATION  05/2004   minimal CAD  . CARDIAC CATHETERIZATION N/A 03/10/2016   Procedure: Left Heart Cath and Coronary Angiography;  Surgeon: Kathleene Hazelhristopher D McAlhany, MD;  [facility];  Service: Cardiovascular;  Laterality: N/A;  . CARDIAC CATHETERIZATION N/A 03/10/2016   Procedure: Coronary Stent Intervention;  Surgeon: Kathleene Hazelhristopher D McAlhany, MD;  Location: [facility] INVASIVE CV LAB;  Service: Cardiovascular;  Laterality: N/A;  . CATARACT EXTRACTION W/PHACO Left 07/12/2016   Procedure: CATARACT EXTRACTION PHACO AND INTRAOCULAR LENS PLACEMENT (IOC);  Surgeon: Nevada CraneBradley Mark King, MD;  Location: [facility] ORS;  Service: Ophthalmology;  Laterality: Left;  US  00:50 AP% 9.2 CDE 4.63 fluid pack lot # [id number] H  . CORONARY ANGIOPLASTY     STENT 5/17   Social History   Tobacco Use  . Smoking status: Former Smoker    Types: Cigarettes    Last attempt to quit: 01/18/2015    Years since quitting: 3.4  . Smokeless tobacco: Never Used  Substance Use Topics  . Alcohol use: Yes    Alcohol/week: 0.0 standard drinks    Comment: about 3 12 packs/wk.  . Drug use: No   Family History  Problem Relation Age of Onset  . Hypertension Father   . Heart disease Father        MI  . Cancer Maternal Grandmother        lung  . Heart failure Paternal Grandfather    Allergies  Allergen Reactions  . Ace Inhibitors Other (See Comments)    REACTION: cough   Current Outpatient Medications on File Prior to Visit  Medication Sig Dispense Refill  . amLODipine (NORVASC) 5 MG tablet TAKE 1 TABLET (5 MG TOTAL) BY MOUTH DAILY. 30 tablet 11  . aspirin 81 MG tablet Take 81 mg by mouth daily.    Marland Kitchen. atorvastatin (LIPITOR) 80 MG tablet TAKE 1 TABLET (80 MG TOTAL) BY MOUTH DAILY AT 6 PM. 30 tablet 9  . carvedilol (COREG) 6.25 MG tablet TAKE 1 TABLET (6.25  MG TOTAL) BY MOUTH 2 (TWO) TIMES DAILY WITH A MEAL. 60 tablet 9  . cholecalciferol (VITAMIN D) 1000 units tablet Take 1,000 Units by mouth daily.    . clopidogrel (PLAVIX) 75 MG tablet Take 1 tablet (75 mg total) by mouth daily. 34 tablet 11  . hydrochlorothiazide (HYDRODIURIL) 25 MG tablet Take 0.5 tablets (12.5 mg total) by mouth daily. 30 tablet 11  . losartan (COZAAR) 100 MG tablet  TAKE 1 TABLET (100 MG TOTAL) BY MOUTH DAILY. 30 tablet 11  . nitroGLYCERIN (NITROSTAT) 0.4 MG SL tablet Place 1 tablet (0.4 mg total) under the tongue every 5 (five) minutes as needed for chest pain (CP or SOB). 25 tablet 3  . sildenafil (VIAGRA) 50 MG tablet Take 1 tablet (50 mg total) by mouth daily as needed. Take one by mouth once daily as directed as needed 30 minutes before sexual activity. 10 tablet 11  . thiamine (VITAMIN B-1) 100 MG tablet Take 100 mg by mouth daily.     No current facility-administered medications on file prior to visit.     Review of Systems  Constitutional: Positive for fatigue. Negative for activity change, appetite change, fever and unexpected weight change.  HENT: Negative for congestion, rhinorrhea, sore throat and trouble swallowing.   Eyes: Negative for pain, redness, itching and visual disturbance.  Respiratory: Negative for cough, chest tightness, shortness of breath and wheezing.   Cardiovascular: Negative for chest pain and palpitations.  Gastrointestinal: Negative for abdominal pain, blood in stool, constipation, diarrhea and nausea.  Endocrine: Negative for cold intolerance, heat intolerance, polydipsia and polyuria.  Genitourinary: Negative for difficulty urinating, dysuria, frequency and urgency.  Musculoskeletal: Positive for arthralgias and back pain. Negative for joint swelling and myalgias.       Aches and pains   Skin: Negative for pallor and rash.  Neurological: Negative for dizziness, tremors, weakness, numbness and headaches.  Hematological: Negative for  adenopathy. Does not bruise/bleed easily.  Psychiatric/Behavioral: Negative for decreased concentration and dysphoric mood. The patient is not nervous/anxious.        Objective:   Physical Exam  Constitutional: He appears well-developed and well-nourished. No distress.  obese and well appearing   HENT:  Head: Normocephalic and atraumatic.  Mouth/Throat: Oropharynx is clear and moist.  Eyes: Pupils are equal, round, and reactive to light. Conjunctivae and EOM are normal.  Neck: Normal range of motion. Neck supple. No JVD present. Carotid bruit is not present. No thyromegaly present.  Cardiovascular: Normal rate, regular rhythm, normal heart sounds and intact distal pulses. Exam reveals no gallop.  Pulmonary/Chest: Effort normal and breath sounds normal. No stridor. No respiratory distress. He has no wheezes. He has no rales.  No crackles  Abdominal: Soft. Bowel sounds are normal. He exhibits no distension, no abdominal bruit and no mass. There is no tenderness.  Musculoskeletal: He exhibits no edema or tenderness.  Lymphadenopathy:    He has no cervical adenopathy.  Neurological: He is alert. He has normal reflexes. He displays normal reflexes. No cranial nerve deficit. Coordination normal.  Skin: Skin is warm and dry. No rash noted.  Psychiatric: He has a normal mood and affect.  Pleasant           Assessment & Plan:   Problem List Items Addressed This Visit      Cardiovascular and Mediastinum   HYPERTENSION, BENIGN ESSENTIAL    bp in fair control at this time  BP Readings from Last 1 Encounters:  06/30/18 132/76   No changes needed Most recent labs reviewed  Disc lifstyle change with low sodium diet and exercise          Endocrine   Type 2 diabetes mellitus with hyperglycemia, without long-term current use of insulin (HCC) - Primary    In pt with hx of vascular dz Lab Results  Component Value Date   HGBA1C 7.5 (H) 06/27/2018   Prev lost to f/u but this is  improved  Cannot do DM teaching due to long work hours  Handouts given Would benefit from wt loss Inc metformin to 1000 mg bid (update if side eff) A1C 3 mo - if ok / then f/u 3 mo later  Ref for DM eye exam       Relevant Medications   metFORMIN (GLUCOPHAGE) 1000 MG tablet     Other   Dyslipidemia    Disc goals for lipids and reasons to control them Rev last labs with pt Rev low sat fat diet in detail  LDL is very well controlled with high dose atorvastatin  In setting of CAD  HDL is low-disc eating fish/fish oil and also exercise       Elevated ALT measurement    Disc imp of etoh and acetaminophen avoidance as much as possible Also wt loss       Obesity (BMI 30-39.9)    Discussed how this problem influences overall health and the risks it imposes  Reviewed plan for weight loss with lower calorie diet (via better food choices and also portion control or program like weight watchers) and exercise building up to or more than 30 minutes 5 days per week including some aerobic activity  No time for self care currently      Relevant Medications   metFORMIN (GLUCOPHAGE) 1000 MG tablet

## 2018-06-30 NOTE — Patient Instructions (Addendum)
For diabetes Try to get most of your carbohydrates from produce (with the exception of white potatoes)  Eat less bread/pasta/rice/snack foods/cereals/sweets and other items from the middle of the grocery store (processed carbs)   Look for the book - Sugarbusters   Get your flu shot at work   Your liver is working too hard  Minimize alcohol the best you can  Also only take tylenol (acetaminophen) when absolutely necessary   Go up on metformin to 1000 mg twice daily  If side effects- call and let me know  Try your best with diet and exercise   Schedule nurse visit for A1C in 3 months  Follow up for annual exam in 6 months   We will refer you for diabetic eye exam

## 2018-06-30 NOTE — Assessment & Plan Note (Signed)
Disc goals for lipids and reasons to control them Rev last labs with pt Rev low sat fat diet in detail  LDL is very well controlled with high dose atorvastatin  In setting of CAD  HDL is low-disc eating fish/fish oil and also exercise

## 2018-06-30 NOTE — Assessment & Plan Note (Signed)
Disc imp of etoh and acetaminophen avoidance as much as possible Also wt loss

## 2018-06-30 NOTE — Assessment & Plan Note (Signed)
bp in fair control at this time  BP Readings from Last 1 Encounters:  06/30/18 132/76   No changes needed Most recent labs reviewed  Disc lifstyle change with low sodium diet and exercise

## 2018-06-30 NOTE — Assessment & Plan Note (Signed)
In pt with hx of vascular dz Lab Results  Component Value Date   HGBA1C 7.5 (H) 06/27/2018   Prev lost to f/u but this is improved Cannot do DM teaching due to long work hours  Handouts given Would benefit from wt loss Inc metformin to 1000 mg bid (update if side eff) A1C 3 mo - if ok / then f/u 3 mo later  Ref for DM eye exam

## 2018-07-16 ENCOUNTER — Other Ambulatory Visit: Payer: Self-pay | Admitting: *Deleted

## 2018-07-16 MED ORDER — HYDROCHLOROTHIAZIDE 25 MG PO TABS: 12.5000 mg | ORAL_TABLET | Freq: Every day | ORAL | 5 refills | Status: DC

## 2018-07-16 MED ORDER — LOSARTAN POTASSIUM 100 MG PO TABS: ORAL_TABLET | ORAL | 5 refills | Status: DC

## 2018-07-16 MED ORDER — AMLODIPINE BESYLATE 5 MG PO TABS: ORAL_TABLET | ORAL | 5 refills | Status: DC

## 2018-07-22 ENCOUNTER — Telehealth: Payer: Self-pay | Admitting: Family Medicine

## 2018-07-22 ENCOUNTER — Other Ambulatory Visit: Payer: Self-pay | Admitting: Family Medicine

## 2018-07-22 MED ORDER — METFORMIN HCL ER 500 MG PO TB24: 1000.0000 mg | ORAL_TABLET | Freq: Every day | ORAL | 11 refills | Status: DC

## 2018-07-22 NOTE — Telephone Encounter (Signed)
I sent metformin XR in -this is a longer acting version that does not tend to give diarrhea   The inst say to take 2 pills once daily   Start with one pill daily for the first 2-4 days to make sure he tolerates it and then move to 2   Keep me posted   Letter in IN box

## 2018-07-22 NOTE — Telephone Encounter (Signed)
Pt states increase on metformin is causing him to have diarrhea. He's wondering if there's something else he can take. He also needs a note to excuse him from work because he wasn't able to work yesterday or today.

## 2018-07-22 NOTE — Telephone Encounter (Signed)
Pt notified of Dr. Royden Purlower's comments and instructions regarding new med and verbalized understanding. Letter placed at front desk for pick up

## 2018-07-22 NOTE — Telephone Encounter (Signed)
Pt called to check status. Pt states he can not work because he is just staying in the bathroom. pt needs a note for work. Please advise

## 2018-09-02 LAB — HM DIABETES EYE EXAM

## 2018-09-09 ENCOUNTER — Encounter: Payer: Self-pay | Admitting: Ophthalmology

## 2018-09-30 ENCOUNTER — Other Ambulatory Visit (INDEPENDENT_AMBULATORY_CARE_PROVIDER_SITE_OTHER): Payer: BLUE CROSS/BLUE SHIELD

## 2018-09-30 ENCOUNTER — Encounter: Payer: Self-pay | Admitting: Family Medicine

## 2018-09-30 DIAGNOSIS — E119 Type 2 diabetes mellitus without complications: Secondary | ICD-10-CM | POA: Diagnosis not present

## 2018-09-30 LAB — POCT GLYCOSYLATED HEMOGLOBIN (HGB A1C): HEMOGLOBIN A1C: 7 % — AB (ref 4.0–5.6)

## 2018-12-08 ENCOUNTER — Other Ambulatory Visit: Payer: Self-pay | Admitting: Cardiovascular Disease

## 2018-12-15 ENCOUNTER — Ambulatory Visit (INDEPENDENT_AMBULATORY_CARE_PROVIDER_SITE_OTHER)
Admission: RE | Admit: 2018-12-15 | Discharge: 2018-12-15 | Disposition: A | Discharge status: Home / Self Care | Payer: BLUE CROSS/BLUE SHIELD | Source: Ambulatory Visit | Attending: Family Medicine | Admitting: Family Medicine

## 2018-12-15 ENCOUNTER — Ambulatory Visit: Payer: BLUE CROSS/BLUE SHIELD | Admitting: Family Medicine

## 2018-12-15 ENCOUNTER — Telehealth: Payer: Self-pay | Admitting: Family Medicine

## 2018-12-15 ENCOUNTER — Encounter: Payer: Self-pay | Admitting: Family Medicine

## 2018-12-15 VITALS — BP 116/70 | HR 54 | Temp 98.2°F | Ht 71.0 in

## 2018-12-15 DIAGNOSIS — E669 Obesity, unspecified: Secondary | ICD-10-CM | POA: Diagnosis not present

## 2018-12-15 DIAGNOSIS — M25552 Pain in left hip: Secondary | ICD-10-CM | POA: Diagnosis not present

## 2018-12-15 DIAGNOSIS — E1165 Type 2 diabetes mellitus with hyperglycemia: Secondary | ICD-10-CM | POA: Diagnosis not present

## 2018-12-15 MED ORDER — MELOXICAM 15 MG PO TABS: 7.5000 mg | ORAL_TABLET | Freq: Every day | ORAL | 1 refills | Status: DC

## 2018-12-15 NOTE — Telephone Encounter (Signed)
-----   Message from Shon Millet, [place] sent at 12/15/2018  4:54 PM EST ----- Pt notified of xray results and Dr. Royden Purl comments. Pt agrees with referral to Ortho, he doesn't have preference [place] vs. [place] he said whoever can get him in is fine, I advise pt our [facility] I will call to schedule appt

## 2018-12-15 NOTE — Assessment & Plan Note (Signed)
Lab Results  Component Value Date   HGBA1C 7.0 (A) 09/30/2018   Improved with metformin and diet  Cannot get on treadmill due to hip pain - will work this up  Nl foot exam today  On arb and statin

## 2018-12-15 NOTE — Progress Notes (Signed)
Subjective:    Patient ID: Daniel [name], male    DOB: [birth year], 53 y.o.   MRN: [id number]  HPI  Here for c/o of bilateral hip pain worse on the L  Now it hurts to pick up leg   Catches when he is walking Stairs are bad  Hurts in the groin  No swelling or bulging  No crunching or noise   Taking - tylenol over the counter -does not help much  Heating pad did not help   DM2 Lab Results  Component Value Date   HGBA1C 7.0 (A) 09/30/2018  that had improved from 7.5 and 8 prior  Metformin xr Arb Atorvastatin   BP Readings from Last 3 Encounters:  12/15/18 116/70  06/30/18 132/76  01/30/18 122/64    Work-on feet- climbing ladders   Flu vaccine -had in oct at work  Patient Active Problem List   Diagnosis Date Noted  . Left hip pain 12/15/2018  . Elevated ALT measurement 06/30/2018  . Type 2 diabetes mellitus with hyperglycemia, without long-term current use of insulin (HCC) 07/22/2017  . Hyperglycemia 04/16/2016  . Elevated transaminase level 04/16/2016  . Coronary artery disease involving native coronary artery of native heart without angina pectoris 03/20/2016  . History of non-ST elevation myocardial infarction (NSTEMI) 03/20/2016  . Dyslipidemia 03/12/2016  . NSTEMI (non-ST elevated myocardial infarction) (HCC) 03/10/2016  . Constipation 02/10/2016  . Prostate cancer screening 02/10/2016  . CVA (cerebral infarction) 03/01/2015  . Right knee pain 04/06/2014  . Benign paroxysmal positional vertigo 03/10/2014  . BPH (benign prostatic hyperplasia) 11/16/2013  . Routine general medical examination at a health care facility 11/12/2013  . Obesity (BMI 30-39.9) 03/02/2011  . B12 deficiency 09/29/2007  . ERECTILE DYSFUNCTION 07/01/2007  . History of tobacco abuse 07/01/2007  . LOW BACK PAIN, CHRONIC 07/01/2007  . HYPERTENSION, BENIGN ESSENTIAL 06/03/2007   Past Medical History:  Diagnosis Date  . CAD (coronary artery disease)    a. NSTEMI: 100% stenosis of the  distal RCA --> DES placed 5/17 CLEARED BY CARDIOLOGIST  . Chronic low back pain    a. s/p back surgery.  . CVA (cerebral infarction)    a. 02/2015 dyarthria 2/2 CVA involving the lateral aspect of the precentral gyrus.  . DM2 (diabetes mellitus, type 2) (HCC)    a. pre-diabetic in the past. b. A1c 03/2016 elevated to 7.9.  . ETOH abuse   . GERD (gastroesophageal reflux disease)   . History of echocardiogram    a. Mild LVH, EF 55-60%, Definity contrast used-LV wall motion could not be adequately assessed  . History of non-ST elevation myocardial infarction (NSTEMI) 03/2016   a. PCI: 3.5 x 24 mm Promus Premier DES to distal RCA  . History of tobacco abuse   . Hypertension    a. 02/2015 echo: 55-65%, trace TR/MR. b. 03/2016: echo with EF of 55-60%.   . Obesity   . Ruptured disk   . Stroke [facility](HCC)    2016   Past Surgical History:  Procedure Laterality Date  . BACK SURGERY     ruptured disk, L-S  . CARDIAC CATHETERIZATION  05/2004   minimal CAD  . CARDIAC CATHETERIZATION N/A 03/10/2016   Procedure: Left Heart Cath and Coronary Angiography;  Surgeon: Kathleene Hazelhristopher D McAlhany, MD;  [facility];  Service: Cardiovascular;  Laterality: N/A;  . CARDIAC CATHETERIZATION N/A 03/10/2016   Procedure: Coronary Stent Intervention;  Surgeon: Kathleene Hazelhristopher D McAlhany, MD;  Location: [facility] INVASIVE CV LAB;  Service: Cardiovascular;  Laterality: N/A;  . CATARACT EXTRACTION W/PHACO Left 07/12/2016   Procedure: CATARACT EXTRACTION PHACO AND INTRAOCULAR LENS PLACEMENT (IOC);  Surgeon: Nevada CraneBradley Mark King, MD;  Location: [facility] ORS;  Service: Ophthalmology;  Laterality: Left;  US  00:50 AP% 9.2 CDE 4.63 fluid pack lot # [id number] H  . CORONARY ANGIOPLASTY     STENT 5/17   Social History   Tobacco Use  . Smoking status: Former Smoker    Types: Cigarettes    Last attempt to quit: 01/18/2015    Years since quitting: 3.9  . Smokeless tobacco: Never Used  Substance Use Topics  . Alcohol use: Yes     Alcohol/week: 0.0 standard drinks    Comment: about 3 12 packs/wk.  . Drug use: No   Family History  Problem Relation Age of Onset  . Hypertension Father   . Heart disease Father        MI  . Cancer Maternal Grandmother        lung  . Heart failure Paternal Grandfather    Allergies  Allergen Reactions  . Ace Inhibitors Other (See Comments)    REACTION: cough   Current Outpatient Medications on File Prior to Visit  Medication Sig Dispense Refill  . amLODipine (NORVASC) 5 MG tablet TAKE 1 TABLET (5 MG TOTAL) BY MOUTH DAILY. 30 tablet 5  . aspirin 81 MG tablet Take 81 mg by mouth daily.    Marland Kitchen. atorvastatin (LIPITOR) 80 MG tablet TAKE 1 TABLET (80 MG TOTAL) BY MOUTH DAILY AT 6 PM. 30 tablet 9  . carvedilol (COREG) 6.25 MG tablet TAKE 1 TABLET (6.25 MG TOTAL) BY MOUTH 2 (TWO) TIMES DAILY WITH A MEAL. 60 tablet 9  . cholecalciferol (VITAMIN D) 1000 units tablet Take 1,000 Units by mouth daily.    . clopidogrel (PLAVIX) 75 MG tablet Take 1 tablet (75 mg total) by mouth daily. 34 tablet 11  . hydrochlorothiazide (HYDRODIURIL) 25 MG tablet Take 0.5 tablets (12.5 mg total) by mouth daily. 15 tablet 5  . losartan (COZAAR) 100 MG tablet TAKE 1 TABLET (100 MG TOTAL) BY MOUTH DAILY. 30 tablet 5  . metFORMIN (GLUCOPHAGE-XR) 500 MG 24 hr tablet Take 2 tablets (1,000 mg total) by mouth daily with breakfast. 60 tablet 11  . nitroGLYCERIN (NITROSTAT) 0.4 MG SL tablet Place 1 tablet (0.4 mg total) under the tongue every 5 (five) minutes as needed for chest pain (CP or SOB). 25 tablet 3  . sildenafil (VIAGRA) 50 MG tablet Take 1 tablet (50 mg total) by mouth daily as needed. Take one by mouth once daily as directed as needed 30 minutes before sexual activity. 10 tablet 11  . thiamine (VITAMIN B-1) 100 MG tablet Take 100 mg by mouth daily.     No current facility-administered medications on file prior to visit.       Review of Systems  Constitutional: Negative for activity change, appetite change,  fatigue, fever and unexpected weight change.  HENT: Negative for congestion, rhinorrhea, sore throat and trouble swallowing.   Eyes: Negative for pain, redness, itching and visual disturbance.  Respiratory: Negative for cough, chest tightness, shortness of breath and wheezing.   Cardiovascular: Negative for chest pain and palpitations.  Gastrointestinal: Negative for abdominal pain, blood in stool, constipation, diarrhea and nausea.  Endocrine: Negative for cold intolerance, heat intolerance, polydipsia and polyuria.  Genitourinary: Negative for difficulty urinating, dysuria, frequency and urgency.  Musculoskeletal: Negative for arthralgias, joint swelling and myalgias.  L groin/hip pain - limiting his activities   Skin: Negative for pallor and rash.  Neurological: Negative for dizziness, tremors, weakness, numbness and headaches.  Hematological: Negative for adenopathy. Does not bruise/bleed easily.  Psychiatric/Behavioral: Negative for decreased concentration and dysphoric mood. The patient is not nervous/anxious.        Objective:   Physical Exam Constitutional:      General: He is not in acute distress.    Appearance: He is well-developed. He is obese. He is not ill-appearing.  HENT:     Head: Normocephalic and atraumatic.  Eyes:     Conjunctiva/sclera: Conjunctivae normal.     Pupils: Pupils are equal, round, and reactive to light.  Neck:     Musculoskeletal: Normal range of motion and neck supple.     Thyroid: No thyromegaly.     Vascular: No carotid bruit or JVD.  Cardiovascular:     Rate and Rhythm: Regular rhythm. Bradycardia present.     Pulses: Normal pulses.     Heart sounds: Normal heart sounds. No gallop.   Pulmonary:     Effort: Pulmonary effort is normal. No respiratory distress.     Breath sounds: Normal breath sounds. No wheezing or rales.  Abdominal:     General: Bowel sounds are normal. There is no distension or abdominal bruit.     Palpations: Abdomen  is soft. There is no mass.     Tenderness: There is no abdominal tenderness.  Musculoskeletal:     Left hip: He exhibits decreased range of motion. He exhibits no tenderness, no swelling, no crepitus and no deformity.     Right lower leg: No edema.     Left lower leg: No edema.     Comments: Pain in L groin with flex and int/ext rotation of hip No swelling or bony tenderness   Nl circulation  Gait is affected by leg pain   Lymphadenopathy:     Cervical: No cervical adenopathy.  Skin:    General: Skin is warm and dry.     Capillary Refill: Capillary refill takes less than 2 seconds.     Findings: No lesion or rash.  Neurological:     Mental Status: He is alert. Mental status is at baseline.     Sensory: No sensory deficit.     Motor: No weakness or atrophy.     Deep Tendon Reflexes: Reflexes are normal and symmetric.  Psychiatric:        Mood and Affect: Mood normal.           Assessment & Plan:   Problem List Items Addressed This Visit      Endocrine   Type 2 diabetes mellitus with hyperglycemia, without long-term current use of insulin (HCC)    Lab Results  Component Value Date   HGBA1C 7.0 (A) 09/30/2018   Improved with metformin and diet  Cannot get on treadmill due to hip pain - will work this up  Nl foot exam today  On arb and statin         Other   Obesity (BMI 30-39.9)    Discussed how this problem influences overall health and the risks it imposes  Reviewed plan for weight loss with lower calorie diet (via better food choices and also portion control or program like weight watchers) and exercise building up to or more than 30 minutes 5 days per week including some aerobic activity         Left hip pain - Primary  Pain in groin area (occ bilat but mainly L )  Xray of hip today- suspect OA  Failed tylenol Px meloxicam to use with caution  Out of work 2 d to try it and stay off leg  Plan to follow xr result       Relevant Orders   DG HIP UNILAT  WITH PELVIS 2-3 VIEWS LEFT

## 2018-12-15 NOTE — Assessment & Plan Note (Signed)
Pain in groin area (occ bilat but mainly L )  Xray of hip today- suspect OA  Failed tylenol Px meloxicam to use with caution  Out of work 2 d to try it and stay off leg  Plan to follow xr result

## 2018-12-15 NOTE — Assessment & Plan Note (Signed)
Discussed how this problem influences overall health and the risks it imposes  Reviewed plan for weight loss with lower calorie diet (via better food choices and also portion control or program like weight watchers) and exercise building up to or more than 30 minutes 5 days per week including some aerobic activity    

## 2018-12-15 NOTE — Patient Instructions (Signed)
Xray of hip today   Try the meloxicam as needed with food once daily - this may help pain   Let's take you out of work for a few days

## 2018-12-18 NOTE — Telephone Encounter (Signed)
Appt made and patient aware.  

## 2018-12-25 ENCOUNTER — Telehealth: Payer: Self-pay | Admitting: Family Medicine

## 2018-12-25 DIAGNOSIS — Z0000 Encounter for general adult medical examination without abnormal findings: Secondary | ICD-10-CM

## 2018-12-25 DIAGNOSIS — E538 Deficiency of other specified B group vitamins: Secondary | ICD-10-CM

## 2018-12-25 DIAGNOSIS — E1165 Type 2 diabetes mellitus with hyperglycemia: Secondary | ICD-10-CM

## 2018-12-25 DIAGNOSIS — Z125 Encounter for screening for malignant neoplasm of prostate: Secondary | ICD-10-CM

## 2018-12-25 DIAGNOSIS — E785 Hyperlipidemia, unspecified: Secondary | ICD-10-CM

## 2018-12-25 DIAGNOSIS — I10 Essential (primary) hypertension: Secondary | ICD-10-CM

## 2018-12-25 NOTE — Telephone Encounter (Signed)
-----   Message from Alvina Chou sent at 12/17/2018  2:32 PM EST ----- Regarding: Lab orders for Friday, 2.21.20 Patient is scheduled for CPX labs, please order future labs, Thanks , Camelia Eng

## 2018-12-26 ENCOUNTER — Other Ambulatory Visit (INDEPENDENT_AMBULATORY_CARE_PROVIDER_SITE_OTHER): Payer: BLUE CROSS/BLUE SHIELD

## 2018-12-26 ENCOUNTER — Encounter: Payer: Self-pay | Admitting: Family Medicine

## 2018-12-26 DIAGNOSIS — I10 Essential (primary) hypertension: Secondary | ICD-10-CM | POA: Diagnosis not present

## 2018-12-26 DIAGNOSIS — E1165 Type 2 diabetes mellitus with hyperglycemia: Secondary | ICD-10-CM

## 2018-12-26 DIAGNOSIS — E538 Deficiency of other specified B group vitamins: Secondary | ICD-10-CM | POA: Diagnosis not present

## 2018-12-26 DIAGNOSIS — Z125 Encounter for screening for malignant neoplasm of prostate: Secondary | ICD-10-CM

## 2018-12-26 DIAGNOSIS — E785 Hyperlipidemia, unspecified: Secondary | ICD-10-CM | POA: Diagnosis not present

## 2018-12-26 LAB — COMPREHENSIVE METABOLIC PANEL
ALT: 55 U/L — ABNORMAL HIGH (ref 0–53)
AST: 32 U/L (ref 0–37)
Albumin: 4.3 g/dL (ref 3.5–5.2)
Alkaline Phosphatase: 118 U/L — ABNORMAL HIGH (ref 39–117)
BUN: 14 mg/dL (ref 6–23)
CO2: 32 meq/L (ref 19–32)
Calcium: 9.3 mg/dL (ref 8.4–10.5)
Chloride: 101 mEq/L (ref 96–112)
Creatinine, Ser: 1.03 mg/dL (ref 0.40–1.50)
GFR: 75.03 mL/min (ref >60.00)
Glucose, Bld: 261 mg/dL — ABNORMAL HIGH (ref 70–99)
Potassium: 4.6 mEq/L (ref 3.5–5.1)
Sodium: 140 mEq/L (ref 135–145)
Total Bilirubin: 0.5 mg/dL (ref 0.2–1.2)
Total Protein: 6.4 g/dL (ref 6.0–8.3)

## 2018-12-26 LAB — CBC WITH DIFFERENTIAL/PLATELET
Basophils Absolute: 0.1 10*3/uL (ref 0.0–0.1)
Basophils Relative: 0.8 % (ref 0.0–3.0)
Eosinophils Absolute: 0.9 10*3/uL — ABNORMAL HIGH (ref 0.0–0.7)
Eosinophils Relative: 13.2 % — ABNORMAL HIGH (ref 0.0–5.0)
HCT: 41.4 % (ref 39.0–52.0)
Hemoglobin: 14.2 g/dL (ref 13.0–17.0)
LYMPHS ABS: 1.7 10*3/uL (ref 0.7–4.0)
Lymphocytes Relative: 24.6 % (ref 12.0–46.0)
MCHC: 34.2 g/dL (ref 30.0–36.0)
MCV: 95.1 fl (ref 78.0–100.0)
Monocytes Absolute: 0.6 10*3/uL (ref 0.1–1.0)
Monocytes Relative: 8.4 % (ref 3.0–12.0)
NEUTROS ABS: 3.6 10*3/uL (ref 1.4–7.7)
Neutrophils Relative %: 53 % (ref 43.0–77.0)
PLATELETS: 128 10*3/uL — AB (ref 150.0–400.0)
RBC: 4.36 Mil/uL (ref 4.22–5.81)
RDW: 12.9 % (ref 11.5–15.5)
WBC: 6.7 10*3/uL (ref 4.0–10.5)

## 2018-12-26 LAB — LIPID PANEL
Cholesterol: 81 mg/dL (ref 0–200)
HDL: 34.9 mg/dL — ABNORMAL LOW (ref >39.00)
LDL Cholesterol: 32 mg/dL (ref 0–99)
NonHDL: 45.68
Total CHOL/HDL Ratio: 2
Triglycerides: 66 mg/dL (ref 0.0–149.0)
VLDL: 13.2 mg/dL (ref 0.0–40.0)

## 2018-12-26 LAB — VITAMIN B12: Vitamin B-12: 165 pg/mL — ABNORMAL LOW (ref 211–911)

## 2018-12-26 LAB — TSH: TSH: 1.73 u[IU]/mL (ref 0.35–4.50)

## 2018-12-26 LAB — PSA: PSA: 0.26 ng/mL (ref 0.10–4.00)

## 2018-12-26 LAB — HEMOGLOBIN A1C: Hgb A1c MFr Bld: 7.7 % — ABNORMAL HIGH (ref 4.6–6.5)

## 2018-12-31 ENCOUNTER — Encounter: Payer: Self-pay | Admitting: Family Medicine

## 2018-12-31 ENCOUNTER — Ambulatory Visit (INDEPENDENT_AMBULATORY_CARE_PROVIDER_SITE_OTHER): Payer: BLUE CROSS/BLUE SHIELD | Admitting: Family Medicine

## 2018-12-31 VITALS — BP 134/86 | HR 58 | Temp 97.9°F | Ht 69.0 in | Wt 282.1 lb

## 2018-12-31 DIAGNOSIS — E785 Hyperlipidemia, unspecified: Secondary | ICD-10-CM

## 2018-12-31 DIAGNOSIS — I10 Essential (primary) hypertension: Secondary | ICD-10-CM | POA: Diagnosis not present

## 2018-12-31 DIAGNOSIS — E1165 Type 2 diabetes mellitus with hyperglycemia: Secondary | ICD-10-CM | POA: Diagnosis not present

## 2018-12-31 DIAGNOSIS — D696 Thrombocytopenia, unspecified: Secondary | ICD-10-CM | POA: Insufficient documentation

## 2018-12-31 DIAGNOSIS — R739 Hyperglycemia, unspecified: Secondary | ICD-10-CM

## 2018-12-31 DIAGNOSIS — N400 Enlarged prostate without lower urinary tract symptoms: Secondary | ICD-10-CM

## 2018-12-31 DIAGNOSIS — R7401 Elevation of levels of liver transaminase levels: Secondary | ICD-10-CM

## 2018-12-31 DIAGNOSIS — R740 Nonspecific elevation of levels of transaminase and lactic acid dehydrogenase [LDH]: Secondary | ICD-10-CM

## 2018-12-31 DIAGNOSIS — E538 Deficiency of other specified B group vitamins: Secondary | ICD-10-CM

## 2018-12-31 DIAGNOSIS — Z0000 Encounter for general adult medical examination without abnormal findings: Secondary | ICD-10-CM | POA: Diagnosis not present

## 2018-12-31 MED ORDER — HYDROCHLOROTHIAZIDE 25 MG PO TABS: 12.5000 mg | ORAL_TABLET | Freq: Every day | ORAL | 11 refills | Status: DC

## 2018-12-31 MED ORDER — AMLODIPINE BESYLATE 5 MG PO TABS: ORAL_TABLET | ORAL | 11 refills | Status: DC

## 2018-12-31 MED ORDER — CYANOCOBALAMIN 1000 MCG/ML IJ SOLN
1000.0000 ug | Freq: Once | INTRAMUSCULAR | Status: AC
  Administered 2018-12-31: 1000 ug via INTRAMUSCULAR

## 2018-12-31 MED ORDER — LOSARTAN POTASSIUM 100 MG PO TABS: ORAL_TABLET | ORAL | 11 refills | Status: DC

## 2018-12-31 NOTE — Assessment & Plan Note (Signed)
Reviewed health habits including diet and exercise and skin cancer prevention Reviewed appropriate screening tests for age  Also reviewed health mt list, fam hx and immunization status , as well as social and family history   See HPI Labs rev  Wt loss strongly enc  B12 shot today and will continue monthly  Disc wait list for shingrix vaccine

## 2018-12-31 NOTE — Patient Instructions (Addendum)
If you are interested in a shingles vaccine (Shingrix) -check to see if your insurance covers it and then call us to get on a wait list   For control of diabetes and also weight loss Try to get most of your carbohydrates from produce (with the exception of white potatoes)  Eat less bread/pasta/rice/snack foods/cereals/sweets and other items from the middle of the grocery store (processed carbs)  Try upper body exercise / lifting weights   Let's do a B12 shot today and then once monthly  Please get vitamin B12 over the counter and take 1000 mcg per day  This should give you more energy   Follow up in 3 months

## 2018-12-31 NOTE — Assessment & Plan Note (Signed)
Lab Results  Component Value Date   PSA 0.26 12/26/2018   PSA 0.27 07/12/2017   PSA 0.28 04/13/2016   No fam hx of prostate cancer No change in voiding (nocturia times 2 avg)

## 2018-12-31 NOTE — Progress Notes (Signed)
Subjective:    Patient ID: Daniel [name], male    DOB: [birth year], 53 y.o.   MRN: [id number]  HPI  Here for health maintenance exam and to review chronic medical problems    Wt Readings from Last 3 Encounters:  12/31/18 282 lb 2 oz (128 kg)  06/30/18 280 lb 12.8 oz (127.4 kg)  01/30/18 273 lb 12 oz (124.2 kg)   41.66 kg/m   Colon cancer screening - not interested in any type   Eye exam 10/19   Tetanus shot 1/14  PNA vaccine 23  1/15   Hep c screen neg 9/17  Flu shot 10/19   Zoster status -interested in shingrix   bp is stable today  No cp or palpitations or headaches or edema  No side effects to medicines  BP Readings from Last 3 Encounters:  12/31/18 134/86  12/15/18 116/70  06/30/18 132/76     DM2 Lab Results  Component Value Date   HGBA1C 7.7 (H) 12/26/2018  he cannot get on the treadmill due to his hip (can only do upper body exercise)  Diet : is "allright" - watches it 50% of the  This is up from 7.0  Takes metformin  Eye exam is up to date  Taking arb and statin  Declines more medicine-wants to make a diet change   H/o BPH Lab Results  Component Value Date   PSA 0.26 12/26/2018   PSA 0.27 07/12/2017   PSA 0.28 04/13/2016   no fam hx of prostate cancer  No frequency during the day  Nocturia 2 times    H/o elevated ALT Lab Results  Component Value Date   ALT 55 (H) 12/26/2018   AST 32 12/26/2018   ALKPHOS 118 (H) 12/26/2018   BILITOT 0.5 12/26/2018  has taken more tylenol due to his hip pain  Seeing ortho today    Hyperlipidemia Lab Results  Component Value Date   CHOL 81 12/26/2018   CHOL 84 06/27/2018   CHOL 93 07/12/2017   Lab Results  Component Value Date   HDL 34.90 (L) 12/26/2018   HDL 34.50 (L) 06/27/2018   HDL 34.70 (L) 07/12/2017   Lab Results  Component Value Date   LDLCALC 32 12/26/2018   LDLCALC 38 06/27/2018   LDLCALC 48 07/12/2017   Lab Results  Component Value Date   TRIG 66.0 12/26/2018   TRIG 55.0  06/27/2018   TRIG 52.0 07/12/2017   Lab Results  Component Value Date   CHOLHDL 2 12/26/2018   CHOLHDL 2 06/27/2018   CHOLHDL 3 07/12/2017   No results found for: LDLDIRECT On statin and diet   B12 def Lab Results  Component Value Date   VITAMINB12 165 (L) 12/26/2018  open to a B12 shot today   Mild thrombocytopenia Lab Results  Component Value Date   WBC 6.7 12/26/2018   HGB 14.2 12/26/2018   HCT 41.4 12/26/2018   MCV 95.1 12/26/2018   PLT 128.0 (L) 12/26/2018   Takes asa and plavix with hx of MI and CVA in the past  No excess bruising or bleeding   Patient Active Problem List   Diagnosis Date Noted  . Thrombocytopenia (HCC) 12/31/2018  . Left hip pain 12/15/2018  . Elevated ALT measurement 06/30/2018  . Type 2 diabetes mellitus with hyperglycemia, without long-term current use of insulin (HCC) 07/22/2017  . Elevated transaminase level 04/16/2016  . Coronary artery disease involving native coronary artery of native heart without angina pectoris 03/20/2016  .  History of non-ST elevation myocardial infarction (NSTEMI) 03/20/2016  . Dyslipidemia 03/12/2016  . NSTEMI (non-ST elevated myocardial infarction) (HCC) 03/10/2016  . Constipation 02/10/2016  . Prostate cancer screening 02/10/2016  . CVA (cerebral infarction) 03/01/2015  . Right knee pain 04/06/2014  . Benign paroxysmal positional vertigo 03/10/2014  . BPH (benign prostatic hyperplasia) 11/16/2013  . Routine general medical examination at a health care facility 11/12/2013  . Morbid obesity (HCC) 03/02/2011  . B12 deficiency 09/29/2007  . ERECTILE DYSFUNCTION 07/01/2007  . History of tobacco abuse 07/01/2007  . LOW BACK PAIN, CHRONIC 07/01/2007  . HYPERTENSION, BENIGN ESSENTIAL 06/03/2007   Past Medical History:  Diagnosis Date  . CAD (coronary artery disease)    a. NSTEMI: 100% stenosis of the distal RCA --> DES placed 5/17 CLEARED BY CARDIOLOGIST  . Chronic low back pain    a. s/p back surgery.  .  CVA (cerebral infarction)    a. 02/2015 dyarthria 2/2 CVA involving the lateral aspect of the precentral gyrus.  . DM2 (diabetes mellitus, type 2) (HCC)    a. pre-diabetic in the past. b. A1c 03/2016 elevated to 7.9.  . ETOH abuse   . GERD (gastroesophageal reflux disease)   . History of echocardiogram    a. Mild LVH, EF 55-60%, Definity contrast used-LV wall motion could not be adequately assessed  . History of non-ST elevation myocardial infarction (NSTEMI) 03/2016   a. PCI: 3.5 x 24 mm Promus Premier DES to distal RCA  . History of tobacco abuse   . Hypertension    a. 02/2015 echo: 55-65%, trace TR/MR. b. 03/2016: echo with EF of 55-60%.   . Obesity   . Ruptured disk   . Stroke [facility])    2016   Past Surgical History:  Procedure Laterality Date  . BACK SURGERY     ruptured disk, L-S  . CARDIAC CATHETERIZATION  05/2004   minimal CAD  . CARDIAC CATHETERIZATION N/A 03/10/2016   Procedure: Left Heart Cath and Coronary Angiography;  Surgeon: Kathleene Hazel, MD;  Location: [facility] INVASIVE CV LAB;  Service: Cardiovascular;  Laterality: N/A;  . CARDIAC CATHETERIZATION N/A 03/10/2016   Procedure: Coronary Stent Intervention;  Surgeon: Kathleene Hazel, MD;  Location: [facility] INVASIVE CV LAB;  Service: Cardiovascular;  Laterality: N/A;  . CATARACT EXTRACTION W/PHACO Left 07/12/2016   Procedure: CATARACT EXTRACTION PHACO AND INTRAOCULAR LENS PLACEMENT (IOC);  Surgeon: Nevada Crane, MD;  Location: [facility] ORS;  Service: Ophthalmology;  Laterality: Left;  [place]  00:50 AP% 9.2 CDE 4.63 fluid pack lot # [id number]  . CORONARY ANGIOPLASTY     STENT 5/17   Social History   Tobacco Use  . Smoking status: Former Smoker    Types: Cigarettes    Last attempt to quit: 01/18/2015    Years since quitting: 3.9  . Smokeless tobacco: Never Used  Substance Use Topics  . Alcohol use: Yes    Alcohol/week: 0.0 standard drinks    Comment: about 3 12 packs/wk.  . Drug use: No   Family History  Problem  Relation Age of Onset  . Hypertension Father   . Heart disease Father        MI  . Cancer Maternal Grandmother        lung  . Heart failure Paternal Grandfather    Allergies  Allergen Reactions  . Ace Inhibitors Other (See Comments)    REACTION: cough   Current Outpatient Medications on File Prior to Visit  Medication Sig Dispense Refill  .  aspirin 81 MG tablet Take 81 mg by mouth daily.    Marland Kitchen atorvastatin (LIPITOR) 80 MG tablet TAKE 1 TABLET (80 MG TOTAL) BY MOUTH DAILY AT 6 PM. 30 tablet 9  . carvedilol (COREG) 6.25 MG tablet TAKE 1 TABLET (6.25 MG TOTAL) BY MOUTH 2 (TWO) TIMES DAILY WITH A MEAL. 60 tablet 9  . cholecalciferol (VITAMIN D) 1000 units tablet Take 1,000 Units by mouth daily.    . clopidogrel (PLAVIX) 75 MG tablet Take 1 tablet (75 mg total) by mouth daily. 34 tablet 11  . meloxicam (MOBIC) 15 MG tablet Take 0.5-1 tablets (7.5-15 mg total) by mouth daily. With a meal 30 tablet 1  . metFORMIN (GLUCOPHAGE-XR) 500 MG 24 hr tablet Take 2 tablets (1,000 mg total) by mouth daily with breakfast. 60 tablet 11  . nitroGLYCERIN (NITROSTAT) 0.4 MG SL tablet Place 1 tablet (0.4 mg total) under the tongue every 5 (five) minutes as needed for chest pain (CP or SOB). 25 tablet 3  . sildenafil (VIAGRA) 50 MG tablet Take 1 tablet (50 mg total) by mouth daily as needed. Take one by mouth once daily as directed as needed 30 minutes before sexual activity. 10 tablet 11  . thiamine (VITAMIN B-1) 100 MG tablet Take 100 mg by mouth daily.     No current facility-administered medications on file prior to visit.     Review of Systems  Constitutional: Negative for activity change, appetite change, fatigue, fever and unexpected weight change.  HENT: Negative for congestion, rhinorrhea, sore throat and trouble swallowing.   Eyes: Negative for pain, redness, itching and visual disturbance.  Respiratory: Negative for cough, chest tightness, shortness of breath and wheezing.   Cardiovascular:  Negative for chest pain and palpitations.  Gastrointestinal: Negative for abdominal pain, blood in stool, constipation, diarrhea and nausea.  Endocrine: Negative for cold intolerance, heat intolerance, polydipsia and polyuria.  Genitourinary: Negative for difficulty urinating, dysuria, frequency and urgency.  Musculoskeletal: Positive for arthralgias. Negative for joint swelling and myalgias.       Hip pain  Seeing orthopedics today  Skin: Negative for pallor and rash.  Neurological: Negative for dizziness, tremors, weakness, numbness and headaches.  Hematological: Negative for adenopathy. Does not bruise/bleed easily.  Psychiatric/Behavioral: Negative for decreased concentration and dysphoric mood. The patient is not nervous/anxious.        Objective:   Physical Exam Constitutional:      General: He is not in acute distress.    Appearance: Normal appearance. He is well-developed. He is obese. He is not ill-appearing.  HENT:     Head: Normocephalic and atraumatic.     Right Ear: Tympanic membrane, ear canal and external ear normal.     Left Ear: Tympanic membrane, ear canal and external ear normal.     Ears:     Comments: Scant cerumen in both canals    Nose: Nose normal.     Mouth/Throat:     Mouth: Mucous membranes are moist.     Pharynx: Oropharynx is clear.  Eyes:     General: No scleral icterus.       Right eye: No discharge.        Left eye: No discharge.     Conjunctiva/sclera: Conjunctivae normal.     Pupils: Pupils are equal, round, and reactive to light.  Neck:     Musculoskeletal: Normal range of motion and neck supple.     Thyroid: No thyromegaly.     Vascular: No carotid bruit or JVD.  Cardiovascular:     Rate and Rhythm: Normal rate and regular rhythm.     Heart sounds: Normal heart sounds. No gallop.   Pulmonary:     Effort: Pulmonary effort is normal. No respiratory distress.     Breath sounds: Normal breath sounds. No wheezing or rhonchi.     Comments:  Good air exch Chest:     Chest wall: No tenderness.  Abdominal:     General: Bowel sounds are normal. There is no distension or abdominal bruit.     Palpations: Abdomen is soft. There is no mass.     Tenderness: There is no abdominal tenderness.  Musculoskeletal:        General: No tenderness.     Right lower leg: No edema.     Left lower leg: No edema.     Comments: Hip pain with walking  Lymphadenopathy:     Cervical: No cervical adenopathy.  Skin:    General: Skin is warm and dry.     Capillary Refill: Capillary refill takes less than 2 seconds.     Coloration: Skin is not pale.     Findings: No erythema or rash.     Comments: Solar lentigines diffusely Multiple tattoos  Ruddy complexion  Neurological:     General: No focal deficit present.     Mental Status: He is alert.     Cranial Nerves: No cranial nerve deficit.     Motor: No abnormal muscle tone.     Coordination: Coordination normal.     Deep Tendon Reflexes: Reflexes are normal and symmetric. Reflexes normal.  Psychiatric:        Mood and Affect: Mood normal.           Assessment & Plan:   Problem List Items Addressed This Visit      Cardiovascular and Mediastinum   HYPERTENSION, BENIGN ESSENTIAL - Primary    bp in fair control at this time  BP Readings from Last 1 Encounters:  12/31/18 134/86   No changes needed Most recent labs reviewed  Disc lifstyle change with low sodium diet and exercise  Weight loss enc      Relevant Medications   amLODipine (NORVASC) 5 MG tablet   losartan (COZAAR) 100 MG tablet   hydrochlorothiazide (HYDRODIURIL) 25 MG tablet     Endocrine   Type 2 diabetes mellitus with hyperglycemia, without long-term current use of insulin (HCC)    Lab Results  Component Value Date   HGBA1C 7.7 (H) 12/26/2018   This is up  Pt takes metformin and declines addnl medication or education at this time  Unsure if he is motivated enough to change diet  Less exercise due to hip  problem (seeing ortho today)  Disc eye and foot care  On arb and statin       Relevant Medications   losartan (COZAAR) 100 MG tablet     Genitourinary   BPH (benign prostatic hyperplasia)    Lab Results  Component Value Date   PSA 0.26 12/26/2018   PSA 0.27 07/12/2017   PSA 0.28 04/13/2016   No fam hx of prostate cancer No change in voiding (nocturia times 2 avg)        Other   B12 deficiency   Relevant Medications   cyanocobalamin ((VITAMIN B-12)) injection 1,000 mcg (Completed)   Morbid obesity (HCC)    Discussed how this problem influences overall health and the risks it imposes  Reviewed plan for weight loss with lower calorie  diet (via better food choices and also portion control or program like weight watchers) and exercise building up to or more than 30 minutes 5 days per week including some aerobic activity   Exercise is limited due to hip problem  -may try lifting weights  Enc better diet       Routine general medical examination at a health care facility    Reviewed health habits including diet and exercise and skin cancer prevention Reviewed appropriate screening tests for age  Also reviewed health mt list, fam hx and immunization status , as well as social and family history   See HPI Labs rev  Wt loss strongly enc  B12 shot today and will continue monthly  Disc wait list for shingrix vaccine        Dyslipidemia    Disc goals for lipids and reasons to control them Rev last labs with pt Rev low sat fat diet in detail Controlled with statin  Does not watch diet well       Elevated ALT measurement   Thrombocytopenia (HCC)    Mild with platelet ct 128  Will watch  Taking asa and plavix for CAD and h/o CVA Denies extra bleeding /bruising lately      RESOLVED: Hyperglycemia

## 2018-12-31 NOTE — Assessment & Plan Note (Signed)
Lab Results  Component Value Date   HGBA1C 7.7 (H) 12/26/2018   This is up  Pt takes metformin and declines addnl medication or education at this time  Unsure if he is motivated enough to change diet  Less exercise due to hip problem (seeing ortho today)  Disc eye and foot care  On arb and statin

## 2018-12-31 NOTE — Assessment & Plan Note (Signed)
bp in fair control at this time  BP Readings from Last 1 Encounters:  12/31/18 134/86   No changes needed Most recent labs reviewed  Disc lifstyle change with low sodium diet and exercise  Weight loss enc

## 2018-12-31 NOTE — Assessment & Plan Note (Signed)
Disc goals for lipids and reasons to control them Rev last labs with pt Rev low sat fat diet in detail Controlled with statin  Does not watch diet well

## 2018-12-31 NOTE — Assessment & Plan Note (Signed)
Discussed how this problem influences overall health and the risks it imposes  Reviewed plan for weight loss with lower calorie diet (via better food choices and also portion control or program like weight watchers) and exercise building up to or more than 30 minutes 5 days per week including some aerobic activity   Exercise is limited due to hip problem  -may try lifting weights  Enc better diet

## 2018-12-31 NOTE — Assessment & Plan Note (Signed)
Mild with platelet ct 128  Will watch  Taking asa and plavix for CAD and h/o CVA Denies extra bleeding /bruising lately

## 2019-01-08 ENCOUNTER — Other Ambulatory Visit: Payer: Self-pay | Admitting: Cardiovascular Disease

## 2019-01-29 ENCOUNTER — Telehealth: Payer: Self-pay | Admitting: *Deleted

## 2019-01-29 NOTE — Telephone Encounter (Signed)
Dr. Clifton James, pending a virtual office visit for clearance, can Plavix be held for hip arthroplasty?   Please route response back to P CV DIV PREOP

## 2019-01-29 NOTE — Telephone Encounter (Signed)
   [facility] Pre-operative Risk Assessment    Request for surgical clearance:  1. What type of surgery is being performed? LEFT TOTAL HIP ARTHROPLASTY   2. When is this surgery scheduled? TBD   3. What type of clearance is required (medical clearance vs. Pharmacy clearance to hold med vs. Both)?  BOTH  4. Are there any medications that need to be held prior to surgery and how long? PLAVIX   5. Practice name and name of physician performing surgery? [facility] / DR Marry Guan.   6. What is your office phone number [phone]    7.   What is your office fax number [phone]  8.   Anesthesia type (None, local, MAC, general) ?  SPINAL ANESTHESIA    Jeanann Lewandowsky 01/29/2019, 9:57 AM  _________________________________________________________________   (provider comments below)

## 2019-01-29 NOTE — Telephone Encounter (Signed)
Left message for pt to call back. Pt has CAD and not seen since 12/2017. It would be ideal for the patient to be seen prior to surgery, however, with the COVID 19 restrictions this would need to be a virtual visit. I will ask the patient when his surgery is planned as elective procedures are now being deferred due to the restrictions.

## 2019-02-02 NOTE — Telephone Encounter (Signed)
Can we arrange virtual visit for pt this afternoon if possible if he indeed is to have surgery.  Thanks.

## 2019-02-02 NOTE — Telephone Encounter (Signed)
OK to hold Plavix before surgery. Daniel [name]

## 2019-02-02 NOTE — Telephone Encounter (Signed)
Spoke with pts wife, Marcelino Duster, [place] on file, re: setting up a virtual visit for pt for surgical clearance.  Pt has no smart phone and doesn't have my chart. Wife will speak with pt this evening (pt don't have his cell phone at work) to see what will be a good time to set this up and call the office back and it can be arranged.

## 2019-02-03 NOTE — Telephone Encounter (Signed)
I left a message for the patient to return my call about scheduling a video visit.

## 2019-02-03 NOTE — Telephone Encounter (Signed)
Follow up:    Patient returning call back from  a few days ago concerning a appt. Please call patient.

## 2019-02-03 NOTE — Telephone Encounter (Addendum)
YOUR CARDIOLOGY TEAM HAS ARRANGED FOR AN E-VISIT FOR YOUR APPOINTMENT - PLEASE REVIEW IMPORTANT INFORMATION BELOW SEVERAL DAYS PRIOR TO YOUR APPOINTMENT  Due to the recent COVID-19 pandemic, we are transitioning in-person office visits to tele-medicine visits in an effort to decrease unnecessary exposure to our patients and staff. Medicare and most insurances are covering these visits without a copay needed. We also encourage you to sign up for MyChart if you have not already done so. You will need a smartphone if possible. For patients that do not have this, we can still complete the visit using a regular telephone but do prefer a smartphone to enable video when possible. You may have a close family member that lives with you that can help. If possible, we also ask that you have a blood pressure cuff and scale at home to measure your blood pressure, heart rate and weight prior to your scheduled appointment. Patients with clinical needs that need an in-person evaluation and testing will still be able to come to the office if absolutely necessary. If you have any questions, feel free to call our office.    IF YOU HAVE A SMARTPHONE, PLEASE DOWNLOAD THE WEBEX APP TO YOUR SMARTPHONE  - If Apple, go to App Store and type in WebEx in the search bar. Download [employer] Webex Meetings, the blue/green circle. The app is free but as with any other app download, your phone may require you to verify saved payment information or Apple password. You do NOT have to create a WebEx account.  - If Android, go to Google Play Store and type in WebEx in the search bar. Download [employer] Webex Meetings, the blue/green circle. The app is free but as with any other app download, your phone may require you to verify saved payment information or Android password. You do NOT have to create a WebEx account.  It is very helpful to have this downloaded before your visit.    2-3 DAYS BEFORE YOUR APPOINTMENT  You will receive a  telephone call from one of our [facility] team members - your caller ID may say "Unknown caller." If this is a video visit, we will confirm that you have been able to download the WebEx app. We will remind you check your blood pressure, heart rate and weight prior to your scheduled appointment. If you have an Apple Watch or Kardia, please upload any pertinent ECG strips the day before or morning of your appointment to MyChart. Our staff will also make sure you have reviewed the consent and agree to move forward with your scheduled tele-health visit.     THE DAY OF YOUR APPOINTMENT  Approximately 15 minutes prior to your scheduled appointment, you will receive a telephone call from one of [facility] team - your caller ID may say "Unknown caller."  Our staff will confirm medications, vital signs for the day and any symptoms you may be experiencing. Please have this information available prior to the time of visit start. It may also be helpful for you to have a pad of paper and pen handy for any instructions given during your visit. They will also walk you through joining the WebEx smartphone meeting if this is a video visit.    CONSENT FOR TELE-HEALTH VISIT - PLEASE RVIEW  I hereby voluntarily request, consent and authorize [facility] and its employed or contracted physicians, physician assistants, nurse practitioners or other licensed health care professionals (the Practitioner), to provide me with telemedicine health care services (the "Services") as   deemed necessary by the treating Practitioner. I acknowledge and consent to receive the Services by the Practitioner via telemedicine. I understand that the telemedicine visit will involve communicating with the Practitioner through live audiovisual communication technology and the disclosure of certain medical information by electronic transmission. I acknowledge that I have been given the opportunity to request an in-person assessment or other available  alternative prior to the telemedicine visit and am voluntarily participating in the telemedicine visit.  I understand that I have the right to withhold or withdraw my consent to the use of telemedicine in the course of my care at any time, without affecting my right to future care or treatment, and that the Practitioner or I may terminate the telemedicine visit at any time. I understand that I have the right to inspect all information obtained and/or recorded in the course of the telemedicine visit and may receive copies of available information for a reasonable fee.  I understand that some of the potential risks of receiving the Services via telemedicine include:  Marland Kitchen Delay or interruption in medical evaluation due to technological equipment failure or disruption; . Information transmitted may not be sufficient (e.g. poor resolution of images) to allow for appropriate medical decision making by the Practitioner; and/or  . In rare instances, security protocols could fail, causing a breach of personal health information.  Furthermore, I acknowledge that it is my responsibility to provide information about my medical history, conditions and care that is complete and accurate to the best of my ability. I acknowledge that Practitioner's advice, recommendations, and/or decision may be based on factors not within their control, such as incomplete or inaccurate data provided by me or distortions of diagnostic images or specimens that may result from electronic transmissions. I understand that the practice of medicine is not an exact science and that Practitioner makes no warranties or guarantees regarding treatment outcomes. I acknowledge that I will receive a copy of this consent concurrently upon execution via email to the email address I last provided but may also request a printed copy by calling the office of [facility].    I understand that my insurance will be billed for this visit.   I have read or had  this consent read to me. . I understand the contents of this consent, which adequately explains the benefits and risks of the Services being provided via telemedicine.  . I have been provided ample opportunity to ask questions regarding this consent and the Services and have had my questions answered to my satisfaction. . I give my informed consent for the services to be provided through the use of telemedicine in my medical care  By participating in this telemedicine visit I agree to the above. Yes  Spoke with patient's wife (DPR on file) to set up virtual visit tomorrow with Nada Boozer, NP so patient can be cleared for surgery. Pt has a schedule appt via webex on 02/04/19 at 9 am. Pt's wife will make pt aware and verbalized understanding.

## 2019-02-03 NOTE — Telephone Encounter (Signed)
  Patient will be available tomorrow 02/04/19. Please call to see what can be worked out.

## 2019-02-03 NOTE — Telephone Encounter (Signed)
Please arrange video visit tomorrow for pre-op eval.

## 2019-02-03 NOTE — Progress Notes (Signed)
Virtual Visit via Video Note   Please note the visit was done with combination webex and facetime. Evaluation Performed:  Pre-op eval   This visit type was conducted due to national recommendations for restrictions regarding the COVID-19 Pandemic (e.g. social distancing).  This format is felt to be most appropriate for this patient at this time.  All issues noted in this document were discussed and addressed.  No physical exam was performed (except for noted visual exam findings with Video Visits).  Please refer to the patient's chart (MyChart message for video visits and phone note for telephone visits) for the patient's consent to telehealth for [facility].  Date:  02/04/2019   ID:  Daniel [name], DOB [birth year], MRN [id number]  Patient Location:  home  Provider location:   office  PCP:  Judy Pimple, MD  Cardiologist:  Verne Carrow, MD  Electrophysiologist:  None   Chief Complaint:  Pre-op eval.  History of Present Illness:    Daniel [name] is a 53 y.o. male who presents via audio/video conferencing for a telehealth visit today.  This is pre-op visit for Lt total hip arthroplasty.  The date and time have not yet been scheduled.    The patient does not have symptoms concerning for COVID-19 infection (fever, chills, cough, or new shortness of breath).   Pt with hx of CAD, DM, HTN, prior CVA.  Pt with NSTEMI 03/2016 with cath and total occlusion of dRCA, and underwent DES stent to area.  LV function by Echo was normal.  One episodes of chest pain 08/2017 but enzymes neg and symptoms resolved so he cancelled stress test.  His last office visit was 12/2017 and he was stable.    Today he has no chest pain and no SOB.  He is able to walk up 2 flights of steps without any chest pain though his hips do bother him.  He has no palpations no syncope.  He feels the same with same activity level he had on last visit with Dr. Clifton James.   He is no longer taking his plavix.  He is  out almost 2 years from last stent.  Will check with Dr. Clifton James to see if he should resume.   Prior CV studies:   The following studies were reviewed today:  Cardiac cath 03/10/16  Prox RCA lesion, 30% stenosed.  Dist RCA lesion, 100% stenosed. Post intervention, there is a 0% residual stenosis.   1. NSTEMI secondary to occluded distal RCA 2. Successful PTCA/DES of the distal RCA 3. No obstructive disease in the LAD or Circumflex 4. Mild LV systolic dysfunction.   Recommendations: Will continue DAPT with ASA and Brilinta. Will continue statin, beta blocker. Echo tomorrow. Admit to stepdown.   Echo 03/12/16 Study Conclusions  - Procedure narrative: Transthoracic echocardiography. Image   quality was adequate. The study was technically difficult, as a   result of poor sound wave transmission. Intravenous contrast   (Definity) was administered. - Left ventricle: The cavity size was mildly dilated. Wall   thickness was increased in a pattern of mild LVH. Systolic   function was normal. The estimated ejection fraction was in the   range of 55% to 60%. Images were inadequate for LV wall motion   assessment. The study is not technically sufficient to allow   evaluation of LV diastolic function. - Left atrium: The atrium was normal in size. - Inferior vena cava: The vessel was normal in size. The   respirophasic diameter  changes were in the normal range (>= 50%),   consistent with normal central venous pressure.  Impressions:  - Technically difficult study with poor echo windows. Definity   contrast was minimally helpful. There appears to be grossly   normal LV systolic function, however, I cannot fully evaluate   wall motion.  Last EKG  08/29/17 SR with poor R wave progression no acute findings.  Past Medical History:  Diagnosis Date  . CAD (coronary artery disease)    a. NSTEMI: 100% stenosis of the distal RCA --> DES placed 5/17 CLEARED BY CARDIOLOGIST  . Chronic low  back pain    a. s/p back surgery.  . CVA (cerebral infarction)    a. 02/2015 dyarthria 2/2 CVA involving the lateral aspect of the precentral gyrus.  . DM2 (diabetes mellitus, type 2) (HCC)    a. pre-diabetic in the past. b. A1c 03/2016 elevated to 7.9.  . ETOH abuse   . GERD (gastroesophageal reflux disease)   . History of echocardiogram    a. Mild LVH, EF 55-60%, Definity contrast used-LV wall motion could not be adequately assessed  . History of non-ST elevation myocardial infarction (NSTEMI) 03/2016   a. PCI: 3.5 x 24 mm Promus Premier DES to distal RCA  . History of tobacco abuse   . Hypertension    a. 02/2015 echo: 55-65%, trace TR/MR. b. 03/2016: echo with EF of 55-60%.   . Obesity   . Ruptured disk   . Stroke [facility])    2016   Past Surgical History:  Procedure Laterality Date  . BACK SURGERY     ruptured disk, L-S  . CARDIAC CATHETERIZATION  05/2004   minimal CAD  . CARDIAC CATHETERIZATION N/A 03/10/2016   Procedure: Left Heart Cath and Coronary Angiography;  Surgeon: Kathleene Hazel, MD;  [facility] INVASIVE CV LAB;  Service: Cardiovascular;  Laterality: N/A;  . CARDIAC CATHETERIZATION N/A 03/10/2016   Procedure: Coronary Stent Intervention;  Surgeon: Kathleene Hazel, MD;  Location: [facility] INVASIVE CV LAB;  Service: Cardiovascular;  Laterality: N/A;  . CATARACT EXTRACTION W/PHACO Left 07/12/2016   Procedure: CATARACT EXTRACTION PHACO AND INTRAOCULAR LENS PLACEMENT (IOC);  Surgeon: Nevada Crane, MD;  Location: [facility] ORS;  Service: Ophthalmology;  Laterality: Left;  [place]  00:50 AP% 9.2 CDE 4.63 fluid pack lot # [id number]  . CORONARY ANGIOPLASTY     STENT 5/17     Current Meds  Medication Sig  . amLODipine (NORVASC) 5 MG tablet TAKE 1 TABLET (5 MG TOTAL) BY MOUTH DAILY.  Marland Kitchen aspirin 81 MG tablet Take 81 mg by mouth daily.  Marland Kitchen atorvastatin (LIPITOR) 80 MG tablet Take 1 tablet (80 mg total) by mouth daily at 6 PM. Please call and schedule an appt for further refills. 1st  attempt  . carvedilol (COREG) 6.25 MG tablet TAKE 1 TABLET (6.25 MG TOTAL) BY MOUTH 2 (TWO) TIMES DAILY WITH A MEAL.  . cholecalciferol (VITAMIN D) 1000 units tablet Take 1,000 Units by mouth daily.  . hydrochlorothiazide (HYDRODIURIL) 25 MG tablet Take 0.5 tablets (12.5 mg total) by mouth daily.  Marland Kitchen losartan (COZAAR) 100 MG tablet TAKE 1 TABLET (100 MG TOTAL) BY MOUTH DAILY.  . meloxicam (MOBIC) 15 MG tablet Take 0.5-1 tablets (7.5-15 mg total) by mouth daily. With a meal  . metFORMIN (GLUCOPHAGE-XR) 500 MG 24 hr tablet Take 2 tablets (1,000 mg total) by mouth daily with breakfast.  . nitroGLYCERIN (NITROSTAT) 0.4 MG SL tablet Place 1 tablet (0.4 mg total) under the  tongue every 5 (five) minutes as needed for chest pain (CP or SOB).  Marland Kitchen sildenafil (VIAGRA) 50 MG tablet Take 1 tablet (50 mg total) by mouth daily as needed. Take one by mouth once daily as directed as needed 30 minutes before sexual activity.  . vitamin B-12 (CYANOCOBALAMIN) 1000 MCG tablet Take 1,000 mcg by mouth daily.      Allergies:   Ace inhibitors   Social History   Tobacco Use  . Smoking status: Former Smoker    Types: Cigarettes    Last attempt to quit: 01/18/2015    Years since quitting: 4.0  . Smokeless tobacco: Never Used  Substance Use Topics  . Alcohol use: Yes    Alcohol/week: 0.0 standard drinks    Comment: about 3 12 packs/wk.  . Drug use: No     Family Hx: The patient's family history includes Cancer in his maternal grandmother; Heart disease in his father; Heart failure in his paternal grandfather; Hypertension in his father.  ROS:   Please see the history of present illness.    General:no colds or fevers, no weight changes though he does acknowlege he needs to lose wt, and will work on this after hip surgery. Skin:no rashes or ulcers HEENT:no blurred vision, no congestion CV:see HPI PUL:see HPI GI:no diarrhea constipation or melena, no indigestion GU:no hematuria, no dysuria MS:+ bil hip pain,  no claudication Neuro:no syncope, no lightheadedness Endo:+ diabetes stable but again needs to lose wt., no thyroid disease   All other systems reviewed and are negative.   Labs/Other Tests and Data Reviewed:    Recent Labs: 12/26/2018: ALT 55; BUN 14; Creatinine, Ser 1.03; Hemoglobin 14.2; Platelets 128.0; Potassium 4.6; Sodium 140; TSH 1.73   Recent Lipid Panel Lab Results  Component Value Date/Time   CHOL 81 12/26/2018 11:30 AM   CHOL 150 02/27/2015 06:45 AM   TRIG 66.0 12/26/2018 11:30 AM   TRIG 98 02/27/2015 06:45 AM   HDL 34.90 (L) 12/26/2018 11:30 AM   HDL 34 (L) 02/27/2015 06:45 AM   CHOLHDL 2 12/26/2018 11:30 AM   LDLCALC 32 12/26/2018 11:30 AM   LDLCALC 96 02/27/2015 06:45 AM    Wt Readings from Last 3 Encounters:  02/04/19 278 lb (126.1 kg)  12/31/18 282 lb 2 oz (128 kg)  06/30/18 280 lb 12.8 oz (127.4 kg)     Objective:    Vital Signs:  BP (!) 172/97   Pulse (!) 59   Ht 5\' 10"  (1.778 m)   Wt 278 lb (126.1 kg)   BMI 39.89 kg/m    Well nourished, well developed male in no acute distress. General:Pleasant affect Skin:color normal Lungs:no audible wheezes when talking, no dyspnea with talking Neuro:alert and oriented X 3, MAE, follows commands, + facial symmetry    ASSESSMENT & PLAN:    1.  Pre-op eval.   Lt total hip arthroplasty  No date yet, was for the end of May but cancelled due to COVID 19.   On risk with hx of MI and CVA pt is 6.6% risk of major cardiac event.  With meeting 4 METS without angina he is at acceptable risk for surgery.  Pt is no longer on plavix will check with Dr. Clifton James to see if we will add back.  If we does he could hold plavix  5 days prior to surgery.  It has been close to 2 years since last stent.    We would prefer pt to have EKG prior to surgery, could  not do on telehealth visit.  Perhaps when has pre-op lab.  Thanks.  2.  HTN BP elevated today but he has not yet taken his medication.  He will call back later today and  give up date to triage nurse. Continue current meds  3.  CAD with stent to [facility] 03/2016.  DES stent currently on ASA alone.  No angina, meets 4 METS of activity without problems.  Normal EF on last echo 2017.    4.  HLD with last LDL at 32 on labs 12/2018 continue lipitor 80 mg   5.  DM-2 last hgb A1c of 7.7 followed by PCP   COVID-19 Education: The signs and symptoms of COVID-19 were discussed with the patient and how to seek care for testing (follow up with PCP or arrange E-visit).  The importance of social distancing was discussed today.  Patient Risk:   After full review of this patient's clinical status, I feel that they are at least moderate risk at this time.  Time:   Today, I have spent 20 minutes with the patient with telehealth technology discussing COVID 19 his surgery, ROS and answering questions.     Medication Adjustments/Labs and Tests Ordered: Current medicines are reviewed at length with the patient today.  Concerns regarding medicines are outlined above.  Tests Ordered: No orders of the defined types were placed in this encounter.  Medication Changes: No orders of the defined types were placed in this encounter.   Disposition:  Follow up in 4 month(s) with Dr. Clifton James  Signed, Nada Boozer, NP  02/04/2019 9:16 AM    [facility]

## 2019-02-04 ENCOUNTER — Encounter: Payer: Self-pay | Admitting: Cardiology

## 2019-02-04 ENCOUNTER — Encounter: Payer: Self-pay | Admitting: *Deleted

## 2019-02-04 ENCOUNTER — Other Ambulatory Visit: Payer: Self-pay

## 2019-02-04 ENCOUNTER — Telehealth (INDEPENDENT_AMBULATORY_CARE_PROVIDER_SITE_OTHER): Payer: BLUE CROSS/BLUE SHIELD | Admitting: Cardiology

## 2019-02-04 ENCOUNTER — Ambulatory Visit (INDEPENDENT_AMBULATORY_CARE_PROVIDER_SITE_OTHER): Payer: BLUE CROSS/BLUE SHIELD | Admitting: *Deleted

## 2019-02-04 VITALS — BP 172/97 | HR 59 | Ht 70.0 in | Wt 278.0 lb

## 2019-02-04 DIAGNOSIS — E7800 Pure hypercholesterolemia, unspecified: Secondary | ICD-10-CM

## 2019-02-04 DIAGNOSIS — Z01818 Encounter for other preprocedural examination: Secondary | ICD-10-CM

## 2019-02-04 DIAGNOSIS — I2510 Atherosclerotic heart disease of native coronary artery without angina pectoris: Secondary | ICD-10-CM | POA: Diagnosis not present

## 2019-02-04 DIAGNOSIS — I10 Essential (primary) hypertension: Secondary | ICD-10-CM

## 2019-02-04 DIAGNOSIS — E538 Deficiency of other specified B group vitamins: Secondary | ICD-10-CM

## 2019-02-04 DIAGNOSIS — E119 Type 2 diabetes mellitus without complications: Secondary | ICD-10-CM

## 2019-02-04 MED ORDER — CYANOCOBALAMIN 1000 MCG/ML IJ SOLN
1000.0000 ug | Freq: Once | INTRAMUSCULAR | Status: AC
  Administered 2019-02-04: 15:00:00 1000 ug via INTRAMUSCULAR

## 2019-02-04 NOTE — Progress Notes (Signed)
Per orders of Dr. Tower, injection of B12 given by ,  M. °Patient tolerated injection well.  °

## 2019-02-05 ENCOUNTER — Other Ambulatory Visit: Payer: Self-pay | Admitting: Family Medicine

## 2019-02-05 NOTE — Telephone Encounter (Signed)
CPE was on 12/31/18 last filled on 12/15/18 #30 tabs with 1 refill, please advise

## 2019-03-25 ENCOUNTER — Telehealth: Payer: Self-pay | Admitting: Family Medicine

## 2019-03-25 NOTE — Telephone Encounter (Signed)
I left a detailed message on patient's voice mail to change appointment to doxy.me. °

## 2019-04-03 ENCOUNTER — Ambulatory Visit (INDEPENDENT_AMBULATORY_CARE_PROVIDER_SITE_OTHER): Payer: BLUE CROSS/BLUE SHIELD | Admitting: Family Medicine

## 2019-04-03 VITALS — BP 134/74

## 2019-04-03 DIAGNOSIS — E1165 Type 2 diabetes mellitus with hyperglycemia: Secondary | ICD-10-CM

## 2019-04-03 DIAGNOSIS — E538 Deficiency of other specified B group vitamins: Secondary | ICD-10-CM | POA: Diagnosis not present

## 2019-04-03 DIAGNOSIS — I10 Essential (primary) hypertension: Secondary | ICD-10-CM

## 2019-04-03 DIAGNOSIS — M25552 Pain in left hip: Secondary | ICD-10-CM

## 2019-04-03 NOTE — Assessment & Plan Note (Signed)
Pending date for hip repl surgery Had clearance from cardiology in april

## 2019-04-03 NOTE — Assessment & Plan Note (Signed)
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

## 2019-04-03 NOTE — Assessment & Plan Note (Signed)
bp in fair control at this time  BP Readings from Last 1 Encounters:  04/03/19 134/74  improved from the cardiology visit (when he forgot his medicine) No changes needed Most recent labs reviewed  Disc lifstyle change with low sodium diet and exercise

## 2019-04-03 NOTE — Assessment & Plan Note (Addendum)
Lab Results  Component Value Date   VITAMINB12 165 (L) 12/26/2018   Now getting B12 shots again and taking it orally Next inj is 6/1

## 2019-04-03 NOTE — Progress Notes (Signed)
Virtual Visit via Video Note  I connected with Daniel [name] on 04/03/19 at  2:00 PM EDT by a video enabled telemedicine application and verified that I am speaking with the correct person using two identifiers.  Location: Patient: home Provider: office   I discussed the limitations of evaluation and management by telemedicine and the availability of in person appointments. The patient expressed understanding and agreed to proceed.  History of Present Illness: Pt presents for f/u of chronic medical problems   Planning L hip repl soon (needs both) Really tired of it   Weight  Wt Readings from Last 3 Encounters:  02/04/19 278 lb (126.1 kg)  12/31/18 282 lb 2 oz (128 kg)  06/30/18 280 lb 12.8 oz (127.4 kg)   Lost 5 lb since then Eating cheerios instead of fatty breakfast   HTN  No cp or palpitations or headaches or edema  No side effects to medicines  BP Readings from Last 3 Encounters:  02/04/19 (!) 172/97  12/31/18 134/86  12/15/18 116/70    He re checked it later that day - 135/74  Lab Results  Component Value Date   CREATININE 1.03 12/26/2018   BUN 14 12/26/2018   NA 140 12/26/2018   K 4.6 12/26/2018   CL 101 12/26/2018   CO2 32 12/26/2018   DM2 Lab Results  Component Value Date   HGBA1C 7.7 (H) 12/26/2018  blood glucose is running 101 to 153    (seldom goes up to 200)  Different times of the day  Eating more salads Did away with potatoes- just once per week  Now cheerios instead of big breakfasts  Grilling chicken   Feet -no problems at all  No problems with vision   Lab Results  Component Value Date   CHOL 81 12/26/2018   HDL 34.90 (L) 12/26/2018   LDLCALC 32 12/26/2018   TRIG 66.0 12/26/2018   CHOLHDL 2 12/26/2018   Statin -working well Or[place] Kenner eating better    Lab Results  Component Value Date   VITAMINB12 165 (L) 12/26/2018  he has started getting B12 shots again   Review of Systems  Constitutional: Negative for chills, fever and  malaise/fatigue.  HENT: Negative for sore throat.   Eyes: Negative for discharge and redness.  Respiratory: Negative for cough and shortness of breath.   Cardiovascular: Negative for chest pain and palpitations.  Gastrointestinal: Negative for heartburn, nausea and vomiting.  Musculoskeletal: Positive for joint pain. Negative for myalgias.       Hip pain   Skin: Negative for itching and rash.  Neurological: Negative for dizziness and headaches.  Endo/Heme/Allergies: Negative for polydipsia.  Psychiatric/Behavioral: Negative for depression. The patient is not nervous/anxious.       Patient Active Problem List   Diagnosis Date Noted  . Thrombocytopenia (HCC) 12/31/2018  . Left hip pain 12/15/2018  . Elevated ALT measurement 06/30/2018  . Type 2 diabetes mellitus with hyperglycemia, without long-term current use of insulin (HCC) 07/22/2017  . Elevated transaminase level 04/16/2016  . Coronary artery disease involving native coronary artery of native heart without angina pectoris 03/20/2016  . History of non-ST elevation myocardial infarction (NSTEMI) 03/20/2016  . Dyslipidemia 03/12/2016  . NSTEMI (non-ST elevated myocardial infarction) (HCC) 03/10/2016  . Constipation 02/10/2016  . Prostate cancer screening 02/10/2016  . CVA (cerebral infarction) 03/01/2015  . Right knee pain 04/06/2014  . Benign paroxysmal positional vertigo 03/10/2014  . BPH (benign prostatic hyperplasia) 11/16/2013  . Routine general medical examination at a  health care facility 11/12/2013  . Morbid obesity (HCC) 03/02/2011  . B12 deficiency 09/29/2007  . ERECTILE DYSFUNCTION 07/01/2007  . History of tobacco abuse 07/01/2007  . LOW BACK PAIN, CHRONIC 07/01/2007  . HYPERTENSION, BENIGN ESSENTIAL 06/03/2007   Past Medical History:  Diagnosis Date  . CAD (coronary artery disease)    a. NSTEMI: 100% stenosis of the distal RCA --> DES placed 5/17 CLEARED BY CARDIOLOGIST  . Chronic low back pain    a. s/p back  surgery.  . CVA (cerebral infarction)    a. 02/2015 dyarthria 2/2 CVA involving the lateral aspect of the precentral gyrus.  . DM2 (diabetes mellitus, type 2) (HCC)    a. pre-diabetic in the past. b. A1c 03/2016 elevated to 7.9.  . ETOH abuse   . GERD (gastroesophageal reflux disease)   . History of echocardiogram    a. Mild LVH, EF 55-60%, Definity contrast used-LV wall motion could not be adequately assessed  . History of non-ST elevation myocardial infarction (NSTEMI) 03/2016   a. PCI: 3.5 x 24 mm Promus Premier DES to distal RCA  . History of tobacco abuse   . Hypertension    a. 02/2015 echo: 55-65%, trace TR/MR. b. 03/2016: echo with EF of 55-60%.   . Obesity   . Ruptured disk   . Stroke [facility])    2016   Past Surgical History:  Procedure Laterality Date  . BACK SURGERY     ruptured disk, L-S  . CARDIAC CATHETERIZATION  05/2004   minimal CAD  . CARDIAC CATHETERIZATION N/A 03/10/2016   Procedure: Left Heart Cath and Coronary Angiography;  Surgeon: Kathleene Hazel, MD;  Location: [facility] INVASIVE CV LAB;  Service: Cardiovascular;  Laterality: N/A;  . CARDIAC CATHETERIZATION N/A 03/10/2016   Procedure: Coronary Stent Intervention;  Surgeon: Kathleene Hazel, MD;  Location: [facility] INVASIVE CV LAB;  Service: Cardiovascular;  Laterality: N/A;  . CATARACT EXTRACTION W/PHACO Left 07/12/2016   Procedure: CATARACT EXTRACTION PHACO AND INTRAOCULAR LENS PLACEMENT (IOC);  Surgeon: Nevada Crane, MD;  Location: [facility] ORS;  Service: Ophthalmology;  Laterality: Left;  [place]  00:50 AP% 9.2 CDE 4.63 fluid pack lot # [id number]  . CORONARY ANGIOPLASTY     STENT 5/17   Social History   Tobacco Use  . Smoking status: Former Smoker    Types: Cigarettes    Last attempt to quit: 01/18/2015    Years since quitting: 4.2  . Smokeless tobacco: Never Used  Substance Use Topics  . Alcohol use: Yes    Alcohol/week: 0.0 standard drinks    Comment: about 3 12 packs/wk.  . Drug use: No   Family History   Problem Relation Age of Onset  . Hypertension Father   . Heart disease Father        MI  . Cancer Maternal Grandmother        lung  . Heart failure Paternal Grandfather    Allergies  Allergen Reactions  . Ace Inhibitors Other (See Comments)    REACTION: cough   Current Outpatient Medications on File Prior to Visit  Medication Sig Dispense Refill  . amLODipine (NORVASC) 5 MG tablet TAKE 1 TABLET (5 MG TOTAL) BY MOUTH DAILY. 30 tablet 11  . aspirin 81 MG tablet Take 81 mg by mouth daily.    Marland Kitchen atorvastatin (LIPITOR) 80 MG tablet Take 1 tablet (80 mg total) by mouth daily at 6 PM. Please call and schedule an appt for further refills. 1st attempt 30 tablet 9  .  carvedilol (COREG) 6.25 MG tablet TAKE 1 TABLET (6.25 MG TOTAL) BY MOUTH 2 (TWO) TIMES DAILY WITH A MEAL. 60 tablet 9  . cholecalciferol (VITAMIN D) 1000 units tablet Take 1,000 Units by mouth daily.    . hydrochlorothiazide (HYDRODIURIL) 25 MG tablet Take 0.5 tablets (12.5 mg total) by mouth daily. 15 tablet 11  . losartan (COZAAR) 100 MG tablet TAKE 1 TABLET (100 MG TOTAL) BY MOUTH DAILY. 30 tablet 11  . meloxicam (MOBIC) 15 MG tablet Take 0.5-1 tablets (7.5-15 mg total) by mouth daily. With a meal as needed 30 tablet 3  . metFORMIN (GLUCOPHAGE-XR) 500 MG 24 hr tablet Take 2 tablets (1,000 mg total) by mouth daily with breakfast. 60 tablet 11  . nitroGLYCERIN (NITROSTAT) 0.4 MG SL tablet Place 1 tablet (0.4 mg total) under the tongue every 5 (five) minutes as needed for chest pain (CP or SOB). 25 tablet 3  . sildenafil (VIAGRA) 50 MG tablet Take 1 tablet (50 mg total) by mouth daily as needed. Take one by mouth once daily as directed as needed 30 minutes before sexual activity. 10 tablet 11  . vitamin B-12 (CYANOCOBALAMIN) 1000 MCG tablet Take 1,000 mcg by mouth daily.      No current facility-administered medications on file prior to visit.     Observations/Objective:   Assessment and Plan: Problem List Items Addressed This  Visit      Cardiovascular and Mediastinum   HYPERTENSION, BENIGN ESSENTIAL    bp in fair control at this time  BP Readings from Last 1 Encounters:  04/03/19 134/74  improved from the cardiology visit (when he forgot his medicine) No changes needed Most recent labs reviewed  Disc lifstyle change with low sodium diet and exercise          Endocrine   Type 2 diabetes mellitus with hyperglycemia, without long-term current use of insulin (HCC) - Primary    Pt is eating better (unable to exercise due to hip OA end stage)  Glucose readings are improved Commended on this Encouraged upper body exercise when able  Will schedule him for a POCT A1C and hopefully it will be improved  He is up to date with eye exam  On arb and statin       Relevant Orders   POCT glycosylated hemoglobin (Hb A1C)     Other   B12 deficiency    Lab Results  Component Value Date   VITAMINB12 165 (L) 12/26/2018   Now getting B12 shots again and taking it orally Next inj is 6/1      Morbid obesity (HCC)    Discussed how this problem influences overall health and the risks it imposes  Reviewed plan for weight loss with lower calorie diet (via better food choices and also portion control or program like weight watchers) and exercise building up to or more than 30 minutes 5 days per week including some aerobic activity   Pt has lost 5 lb so far watching diet  Unable to exercise much due to OA hips-pending surgery      Left hip pain    Pending date for hip repl surgery Had clearance from cardiology in april          Follow Up Instructions: Let's get an A1c when you come on Monday for your B12 shot   Keep working on healthy diet for blood sugar and cholesterol  Good job so far Try to get most of your carbohydrates from produce (with the exception of  white potatoes)  Eat less bread/pasta/rice/snack foods/cereals/sweets and other items from the middle of the grocery store (processed carbs)   Try to  do upper body exercise when you can    I discussed the assessment and treatment plan with the patient. The patient was provided an opportunity to ask questions and all were answered. The patient agreed with the plan and demonstrated an understanding of the instructions.   The patient was advised to call back or seek an in-person evaluation if the symptoms worsen or if the condition fails to improve as anticipated.     Roxy MannsMarne , MD

## 2019-04-03 NOTE — Patient Instructions (Signed)
Let's get an A1c when you come on Monday for your B12 shot   Keep working on healthy diet for blood sugar and cholesterol  Good job so far Try to get most of your carbohydrates from produce (with the exception of white potatoes)  Eat less bread/pasta/rice/snack foods/cereals/sweets and other items from the middle of the grocery store (processed carbs)   Try to do upper body exercise when you can

## 2019-04-03 NOTE — Assessment & Plan Note (Signed)
Pt is eating better (unable to exercise due to hip OA end stage)  Glucose readings are improved Commended on this Encouraged upper body exercise when able  Will schedule him for a POCT A1C and hopefully it will be improved  He is up to date with eye exam  On arb and statin

## 2019-04-05 ENCOUNTER — Encounter: Payer: Self-pay | Admitting: Family Medicine

## 2019-04-06 ENCOUNTER — Other Ambulatory Visit (INDEPENDENT_AMBULATORY_CARE_PROVIDER_SITE_OTHER): Payer: BLUE CROSS/BLUE SHIELD

## 2019-04-06 ENCOUNTER — Ambulatory Visit (INDEPENDENT_AMBULATORY_CARE_PROVIDER_SITE_OTHER): Payer: BLUE CROSS/BLUE SHIELD | Admitting: *Deleted

## 2019-04-06 ENCOUNTER — Encounter: Payer: Self-pay | Admitting: Family Medicine

## 2019-04-06 DIAGNOSIS — E538 Deficiency of other specified B group vitamins: Secondary | ICD-10-CM | POA: Diagnosis not present

## 2019-04-06 DIAGNOSIS — E1165 Type 2 diabetes mellitus with hyperglycemia: Secondary | ICD-10-CM

## 2019-04-06 LAB — POCT GLYCOSYLATED HEMOGLOBIN (HGB A1C): Hemoglobin A1C: 8.3 % — AB (ref 4.0–5.6)

## 2019-04-06 MED ORDER — CYANOCOBALAMIN 1000 MCG/ML IJ SOLN
1000.0000 ug | Freq: Once | INTRAMUSCULAR | Status: AC
  Administered 2019-04-06: 1000 ug via INTRAMUSCULAR

## 2019-04-06 NOTE — Progress Notes (Signed)
Per orders of Dr. Tower, injection of B12 given by ,  M. °Patient tolerated injection well.  °

## 2019-04-08 ENCOUNTER — Telehealth: Payer: Self-pay | Admitting: *Deleted

## 2019-04-08 MED ORDER — GLIPIZIDE ER 5 MG PO TB24: 5.0000 mg | ORAL_TABLET | Freq: Every day | ORAL | 11 refills | Status: DC

## 2019-04-08 NOTE — Telephone Encounter (Signed)
-----   Message from Judy Pimple, MD sent at 04/06/2019  7:51 PM EDT ----- Released on mychart  I want to add glipizide XL 5 mg 1 po qd in am #30 11 refills -please send it in if agreeable  This medication has the potential to drop glucose low so keep an eye on it and don't skip meals Keep watching blood glucose and if no improvement in the next 2 weeks please let me know  I want to get next A1C in 3 months (working around his hip surgery when he gets that scheduled)

## 2019-04-08 NOTE — Telephone Encounter (Signed)
Left VM requesting pt to call the office back regarding his lab results  

## 2019-04-08 NOTE — Telephone Encounter (Signed)
Wife notified of lab results and Dr. Royden Purl comments and comments off of mychart. Rx sent to pharmacy and wife will advised him of instructions regarding blood sugars. They will call back and schedule lab appt in 3 months once they know more info regarding surgery date

## 2019-04-08 NOTE — Telephone Encounter (Signed)
Marcelino Duster (spouse) called stating pt will not be home until after 6    She stated you can call her @ [phone]

## 2019-04-09 NOTE — Telephone Encounter (Signed)
Pt left v/m that on 04/08/19 BS was 47 and pt got it up to 123; this AM BS 37 and now is 119, pt request cb with what to do. Unable to reach pt by phone; left v/m for pt to cb. FYI to DR [employer] and [place] CMA.

## 2019-04-09 NOTE — Telephone Encounter (Signed)
I think he just started glipizide so hold it for now  Eat a meal with protein and watch glucose closely through the day  If you cannot get glucose to go up- go to the ED  Please ask what time he first noticed low sugar and how many hours before had he eaten  (I assume dinner the night before)

## 2019-04-09 NOTE — Telephone Encounter (Signed)
I also left a VM requesting pt to call the office back

## 2019-04-13 ENCOUNTER — Ambulatory Visit: Payer: BLUE CROSS/BLUE SHIELD | Admitting: Family Medicine

## 2019-04-13 ENCOUNTER — Other Ambulatory Visit: Payer: Self-pay

## 2019-04-13 ENCOUNTER — Encounter: Payer: Self-pay | Admitting: Family Medicine

## 2019-04-13 VITALS — BP 130/80 | HR 66 | Temp 98.1°F | Ht 70.0 in | Wt 285.0 lb

## 2019-04-13 DIAGNOSIS — E1165 Type 2 diabetes mellitus with hyperglycemia: Secondary | ICD-10-CM

## 2019-04-13 NOTE — Patient Instructions (Addendum)
I would like your fasting blood glucose to be 120s or a little lower (fasting in the am)  2 hours after eating- we aim for 140s or lower Anything below 70 is too low   Don't miss meals Hold the glipizide  (? Too much with the big diet change)   Take your metformin with breakfast or near breakfast time  If you cannot remember and have to take at 4 am - keep an eye on blood sugar   Great job with the diet change   Please let us know this week how your blood glucose is running off the glipizide   Have Dr Marry Guan send me a clearance form for your surgery when it is time

## 2019-04-13 NOTE — Progress Notes (Signed)
Subjective:    Patient ID: Daniel [name], male    DOB: 08/21/1966, 53 y.o.   MRN: [id number]  HPI Pt is here for problems with blood sugar dropping low    Weight  Wt Readings from Last 3 Encounters:  04/13/19 285 lb (129.3 kg)  02/04/19 278 lb (126.1 kg)  12/31/18 282 lb 2 oz (128 kg)  he is not eating carbs any more  Cannot get on the treadmill    A1C was 7.7 in February Then went up in june Lab Results  Component Value Date   HGBA1C 8.3 (A) 04/06/2019  he was trying hard to eat better at that time  He takes metformin xr 1000 mg daily  Glipizide XL 5 mg daily   He called the office on 6/3 with a glucose of 47  I advised to stop the glipizide xl at that time   This am it was 29!   (dizzy and jittery) He drank juice and ate toast   119  Then egg and 165   Watching diet so well that glipizide   Gets up at 4 am  Eats breakfast about 9   Eats lunch at 12:00  Eats dinner at 6-7:00   BP Readings from Last 3 Encounters:  04/13/19 130/80  04/03/19 134/74  02/04/19 (!) 172/97   Patient Active Problem List   Diagnosis Date Noted   Thrombocytopenia (HCC) 12/31/2018   Left hip pain 12/15/2018   Elevated ALT measurement 06/30/2018   Type 2 diabetes mellitus with hyperglycemia, without long-term current use of insulin (HCC) 07/22/2017   Elevated transaminase level 04/16/2016   Coronary artery disease involving native coronary artery of native heart without angina pectoris 03/20/2016   History of non-ST elevation myocardial infarction (NSTEMI) 03/20/2016   Dyslipidemia 03/12/2016   NSTEMI (non-ST elevated myocardial infarction) (HCC) 03/10/2016   Constipation 02/10/2016   Prostate cancer screening 02/10/2016   CVA (cerebral infarction) 03/01/2015   Right knee pain 04/06/2014   Benign paroxysmal positional vertigo 03/10/2014   BPH (benign prostatic hyperplasia) 11/16/2013   Routine general medical examination at a health care facility 11/12/2013    Morbid obesity (HCC) 03/02/2011   B12 deficiency 09/29/2007   ERECTILE DYSFUNCTION 07/01/2007   History of tobacco abuse 07/01/2007   LOW BACK PAIN, CHRONIC 07/01/2007   HYPERTENSION, BENIGN ESSENTIAL 06/03/2007   Past Medical History:  Diagnosis Date   CAD (coronary artery disease)    a. NSTEMI: 100% stenosis of the distal RCA --> DES placed 5/17 CLEARED BY CARDIOLOGIST   Chronic low back pain    a. s/p back surgery.   CVA (cerebral infarction)    a. 02/2015 dyarthria 2/2 CVA involving the lateral aspect of the precentral gyrus.   DM2 (diabetes mellitus, type 2) (HCC)    a. pre-diabetic in the past. b. A1c 03/2016 elevated to 7.9.   ETOH abuse    GERD (gastroesophageal reflux disease)    History of echocardiogram    a. Mild LVH, EF 55-60%, Definity contrast used-LV wall motion could not be adequately assessed   History of non-ST elevation myocardial infarction (NSTEMI) 03/2016   a. PCI: 3.5 x 24 mm Promus Premier DES to distal RCA   History of tobacco abuse    Hypertension    a. 02/2015 echo: 55-65%, trace TR/MR. b. 03/2016: echo with EF of 55-60%.    Obesity    Ruptured disk    Stroke [facility](HCC)    2016   Past Surgical History:  Procedure  Laterality Date   BACK SURGERY     ruptured disk, L-S   CARDIAC CATHETERIZATION  05/2004   minimal CAD   CARDIAC CATHETERIZATION N/A 03/10/2016   Procedure: Left Heart Cath and Coronary Angiography;  Surgeon: Kathleene Hazelhristopher D McAlhany, MD;  [facility];  Service: Cardiovascular;  Laterality: N/A;   CARDIAC CATHETERIZATION N/A 03/10/2016   Procedure: Coronary Stent Intervention;  Surgeon: Kathleene Hazelhristopher D McAlhany, MD;  [facility];  Service: Cardiovascular;  Laterality: N/A;   CATARACT EXTRACTION W/PHACO Left 07/12/2016   Procedure: CATARACT EXTRACTION PHACO AND INTRAOCULAR LENS PLACEMENT (IOC);  Surgeon: Nevada CraneBradley Mark King, MD;  Location: [facility] ORS;  Service: Ophthalmology;  Laterality: Left;  [place]   00:50 AP% 9.2 CDE 4.63 fluid pack lot # [id number] H   CORONARY ANGIOPLASTY     STENT 5/17   Social History   Tobacco Use   Smoking status: Former Smoker    Types: Cigarettes    Last attempt to quit: 01/18/2015    Years since quitting: 4.2   Smokeless tobacco: Never Used  Substance Use Topics   Alcohol use: Yes    Alcohol/week: 0.0 standard drinks    Comment: about 3 12 packs/wk.   Drug use: No   Family History  Problem Relation Age of Onset   Hypertension Father    Heart disease Father        MI   Cancer Maternal Grandmother        lung   Heart failure Paternal Grandfather    Allergies  Allergen Reactions   Ace Inhibitors Other (See Comments)    REACTION: cough   Current Outpatient Medications on File Prior to Visit  Medication Sig Dispense Refill   amLODipine (NORVASC) 5 MG tablet TAKE 1 TABLET (5 MG TOTAL) BY MOUTH DAILY. 30 tablet 11   aspirin 81 MG tablet Take 81 mg by mouth daily.     atorvastatin (LIPITOR) 80 MG tablet Take 1 tablet (80 mg total) by mouth daily at 6 PM. Please call and schedule an appt for further refills. 1st attempt 30 tablet 9   carvedilol (COREG) 6.25 MG tablet TAKE 1 TABLET (6.25 MG TOTAL) BY MOUTH 2 (TWO) TIMES DAILY WITH A MEAL. 60 tablet 9   cholecalciferol (VITAMIN D) 1000 units tablet Take 1,000 Units by mouth daily.     glipiZIDE (GLIPIZIDE XL) 5 MG 24 hr tablet Take 1 tablet (5 mg total) by mouth daily with breakfast. 30 tablet 11   hydrochlorothiazide (HYDRODIURIL) 25 MG tablet Take 0.5 tablets (12.5 mg total) by mouth daily. 15 tablet 11   losartan (COZAAR) 100 MG tablet TAKE 1 TABLET (100 MG TOTAL) BY MOUTH DAILY. 30 tablet 11   meloxicam (MOBIC) 15 MG tablet Take 0.5-1 tablets (7.5-15 mg total) by mouth daily. With a meal as needed 30 tablet 3   metFORMIN (GLUCOPHAGE-XR) 500 MG 24 hr tablet Take 2 tablets (1,000 mg total) by mouth daily with breakfast. 60 tablet 11   nitroGLYCERIN (NITROSTAT) 0.4 MG SL tablet Place  1 tablet (0.4 mg total) under the tongue every 5 (five) minutes as needed for chest pain (CP or SOB). 25 tablet 3   sildenafil (VIAGRA) 50 MG tablet Take 1 tablet (50 mg total) by mouth daily as needed. Take one by mouth once daily as directed as needed 30 minutes before sexual activity. 10 tablet 11   vitamin B-12 (CYANOCOBALAMIN) 1000 MCG tablet Take 1,000 mcg by mouth daily.      No current  facility-administered medications on file prior to visit.     Review of Systems  Constitutional: Negative for activity change, appetite change, fatigue, fever and unexpected weight change.  HENT: Negative for congestion, rhinorrhea, sore throat and trouble swallowing.   Eyes: Negative for pain, redness, itching and visual disturbance.  Respiratory: Negative for cough, chest tightness, shortness of breath and wheezing.   Cardiovascular: Negative for chest pain and palpitations.  Gastrointestinal: Negative for abdominal pain, blood in stool, constipation, diarrhea and nausea.  Endocrine: Negative for cold intolerance, heat intolerance, polydipsia and polyuria.  Genitourinary: Negative for difficulty urinating, dysuria, frequency and urgency.  Musculoskeletal: Positive for arthralgias. Negative for joint swelling and myalgias.       Hip pain- planning on hip replacement  Skin: Negative for pallor and rash.  Allergic/Immunologic: Negative for immunocompromised state.  Neurological: Negative for dizziness, tremors, weakness, numbness and headaches.  Hematological: Negative for adenopathy. Does not bruise/bleed easily.  Psychiatric/Behavioral: Negative for decreased concentration and dysphoric mood. The patient is not nervous/anxious.        Objective:   Physical Exam Constitutional:      General: He is not in acute distress.    Appearance: Normal appearance. He is well-developed. He is obese. He is not ill-appearing or diaphoretic.  HENT:     Head: Normocephalic and atraumatic.     Mouth/Throat:      Mouth: Mucous membranes are moist.     Pharynx: Oropharynx is clear.  Eyes:     General: No scleral icterus.    Conjunctiva/sclera: Conjunctivae normal.     Pupils: Pupils are equal, round, and reactive to light.  Neck:     Musculoskeletal: Normal range of motion and neck supple.     Thyroid: No thyromegaly.     Vascular: No carotid bruit or JVD.  Cardiovascular:     Rate and Rhythm: Normal rate and regular rhythm.     Heart sounds: Normal heart sounds. No gallop.   Pulmonary:     Effort: Pulmonary effort is normal. No respiratory distress.     Breath sounds: Normal breath sounds. No wheezing or rales.  Abdominal:     General: There is no distension or abdominal bruit.     Palpations: There is no mass.  Musculoskeletal:     Right lower leg: No edema.     Left lower leg: No edema.  Lymphadenopathy:     Cervical: No cervical adenopathy.  Skin:    General: Skin is warm and dry.     Findings: No rash.  Neurological:     Mental Status: He is alert. Mental status is at baseline.     Sensory: No sensory deficit.     Deep Tendon Reflexes: Reflexes are normal and symmetric.  Psychiatric:        Mood and Affect: Mood normal.           Assessment & Plan:   Problem List Items Addressed This Visit      Endocrine   Type 2 diabetes mellitus with hyperglycemia, without long-term current use of insulin ([place]) - Primary    Hypoglycemia with addn of glipizide after a1c went up  Lab Results  Component Value Date   HGBA1C 8.3 (A) 04/06/2019   He also started a strict lower carb diet and occ has long periods between meals Glipizide xl -inst to hole  conitnue better diet/do not skip meals Get protein with every meal and snack  Goal is for wt loss Check glucose qd and prn-update  us in a week or so  Needs documented better glucose control before his hip surgery (has already been cleared by cardiol) If hypoglycemia persists-inst to call

## 2019-04-13 NOTE — Assessment & Plan Note (Signed)
Hypoglycemia with addn of glipizide after a1c went up  Lab Results  Component Value Date   HGBA1C 8.3 (A) 04/06/2019   He also started a strict lower carb diet and occ has long periods between meals Glipizide xl -inst to hole  conitnue better diet/do not skip meals Get protein with every meal and snack  Goal is for wt loss Check glucose qd and prn-update [place] in a week or so  Needs documented better glucose control before his hip surgery (has already been cleared by cardiol) If hypoglycemia persists-inst to call

## 2019-04-13 NOTE — Telephone Encounter (Signed)
Patient was seen today in office visit.

## 2019-05-07 NOTE — Discharge Instructions (Signed)
Instructions after Total Hip Replacement ° ° °   P. , Jr., M.D.    ° Dept. of Orthopaedics & Sports Medicine ° [facility] ° [address] ° [place], [place]  [address] ° Phone: [phone]   Fax: [phone] ° °  °DIET: °• Drink plenty of non-alcoholic fluids. °• Resume your normal diet. Include foods high in fiber. ° °ACTIVITY:  °• You may use crutches or a walker with weight-bearing as tolerated, unless instructed otherwise. °• You may be weaned off of the walker or crutches by your Physical Therapist.  °• Do NOT reach below the level of your knees or cross your legs until allowed.    °• Continue doing gentle exercises. Exercising will reduce the pain and swelling, increase motion, and prevent muscle weakness.   °• Please continue to use the TED compression stockings for 6 weeks. You may remove the stockings at night, but should reapply them in the morning. °• Do not drive or operate any equipment until instructed. ° °WOUND CARE:  °• Continue to use ice packs periodically to reduce pain and swelling. °• Keep the incision clean and dry. °• You may bathe or shower after the staples are removed at the first office visit following surgery. ° °MEDICATIONS: °• You may resume your regular medications. °• Please take the pain medication as prescribed on the medication. °• Do not take pain medication on an empty stomach. °• You have been given a prescription for a blood thinner to prevent blood clots. Please take the medication as instructed. (NOTE: After completing a 2 week course of Lovenox, take one Enteric-coated aspirin once a day.) °• Pain medications and iron supplements can cause constipation. Use a stool softener (Senokot or Colace) on a daily basis and a laxative (dulcolax or miralax) as needed. °• Do not drive or drink alcoholic beverages when taking pain medications. ° °CALL THE OFFICE FOR: °• Temperature above 101 degrees °• Excessive bleeding or drainage on the dressing. °• Excessive  swelling, coldness, or paleness of the toes. °• Persistent nausea and vomiting. ° °FOLLOW-UP:  °• You should have an appointment to return to the office in 6 weeks after surgery. °• Arrangements have been made for continuation of Physical Therapy (either home therapy or outpatient therapy). °  °

## 2019-05-13 ENCOUNTER — Telehealth: Payer: Self-pay | Admitting: *Deleted

## 2019-05-13 MED ORDER — VALSARTAN 80 MG PO TABS: 80.0000 mg | ORAL_TABLET | Freq: Every day | ORAL | 11 refills | Status: DC

## 2019-05-13 NOTE — Telephone Encounter (Signed)
Patient's wife left a voicemail stating that [facility] has advised them that his Losartan is on backorder and they do not know how long it will be. Mrs. Common requested a call back as to what they should do since they can not get his Losartan? [facility]

## 2019-05-13 NOTE — Telephone Encounter (Signed)
Called [facility] and they said they have valsartan 80mg , or olmesartan 5mg , 20mg , and 40mg 

## 2019-05-13 NOTE — Telephone Encounter (Signed)
Please ask or have them ask pharmacy what covered alternatives they have  Thanks

## 2019-05-13 NOTE — Telephone Encounter (Signed)
Pt's wife notified Rx sent and I advised her of Dr. Marliss Coots instructions

## 2019-05-13 NOTE — Telephone Encounter (Signed)
I [facility] for the 80 mg dose Let me know if any problems or big blood pressure changes  This should be just about the same  Thanks

## 2019-05-21 ENCOUNTER — Encounter
Admission: RE | Admit: 2019-05-21 | Discharge: 2019-05-21 | Disposition: A | Discharge status: Home / Self Care | Payer: BC Managed Care – PPO | Source: Ambulatory Visit | Attending: Orthopedic Surgery | Admitting: Orthopedic Surgery

## 2019-05-21 ENCOUNTER — Other Ambulatory Visit: Payer: Self-pay

## 2019-05-21 DIAGNOSIS — Z01818 Encounter for other preprocedural examination: Secondary | ICD-10-CM | POA: Insufficient documentation

## 2019-05-21 HISTORY — DX: Acute myocardial infarction, unspecified: I21.9

## 2019-05-21 LAB — CBC WITH DIFFERENTIAL/PLATELET
Abs Immature Granulocytes: 0.02 10*3/uL (ref 0.00–0.07)
Basophils Absolute: 0.1 10*3/uL (ref 0.0–0.1)
Basophils Relative: 1 %
Eosinophils Absolute: 1.2 10*3/uL — ABNORMAL HIGH (ref 0.0–0.5)
Eosinophils Relative: 17 %
HCT: 39.8 % (ref 39.0–52.0)
Hemoglobin: 13.4 g/dL (ref 13.0–17.0)
Immature Granulocytes: 0 %
Lymphocytes Relative: 24 %
Lymphs Abs: 1.6 10*3/uL (ref 0.7–4.0)
MCH: 32.6 pg (ref 26.0–34.0)
MCHC: 33.7 g/dL (ref 30.0–36.0)
MCV: 96.8 fL (ref 80.0–100.0)
Monocytes Absolute: 0.7 10*3/uL (ref 0.1–1.0)
Monocytes Relative: 10 %
Neutro Abs: 3.3 10*3/uL (ref 1.7–7.7)
Neutrophils Relative %: 48 %
Platelets: 113 10*3/uL — ABNORMAL LOW (ref 150–400)
RBC: 4.11 MIL/uL — ABNORMAL LOW (ref 4.22–5.81)
RDW: 12.4 % (ref 11.5–15.5)
WBC: 6.9 10*3/uL (ref 4.0–10.5)
nRBC: 0 % (ref 0.0–0.2)

## 2019-05-21 LAB — COMPREHENSIVE METABOLIC PANEL
ALT: 93 U/L — ABNORMAL HIGH (ref 0–44)
AST: 57 U/L — ABNORMAL HIGH (ref 15–41)
Albumin: 4.2 g/dL (ref 3.5–5.0)
Alkaline Phosphatase: 111 U/L (ref 38–126)
Anion gap: 9 (ref 5–15)
BUN: 16 mg/dL (ref 6–20)
CO2: 24 mmol/L (ref 22–32)
Calcium: 9.1 mg/dL (ref 8.9–10.3)
Chloride: 106 mmol/L (ref 98–111)
Creatinine, Ser: 0.85 mg/dL (ref 0.61–1.24)
GFR calc Af Amer: >60 mL/min (ref >60)
GFR calc non Af Amer: >60 mL/min (ref >60)
Glucose, Bld: 116 mg/dL — ABNORMAL HIGH (ref 70–99)
Potassium: 4.5 mmol/L (ref 3.5–5.1)
Sodium: 139 mmol/L (ref 135–145)
Total Bilirubin: 0.8 mg/dL (ref 0.3–1.2)
Total Protein: 7.1 g/dL (ref 6.5–8.1)

## 2019-05-21 LAB — URINALYSIS, ROUTINE W REFLEX MICROSCOPIC
Bilirubin Urine: NEGATIVE
Glucose, UA: NEGATIVE mg/dL
Hgb urine dipstick: NEGATIVE
Ketones, ur: NEGATIVE mg/dL
Leukocytes,Ua: NEGATIVE
Nitrite: NEGATIVE
Protein, ur: NEGATIVE mg/dL
Specific Gravity, Urine: 1.017 (ref 1.005–1.030)
pH: 5 (ref 5.0–8.0)

## 2019-05-21 LAB — TYPE AND SCREEN
ABO/RH(D): O POS
Antibody Screen: NEGATIVE

## 2019-05-21 LAB — SURGICAL PCR SCREEN
MRSA, PCR: NEGATIVE
Staphylococcus aureus: NEGATIVE

## 2019-05-21 LAB — SEDIMENTATION RATE: Sed Rate: 16 mm/hr (ref 0–20)

## 2019-05-21 LAB — C-REACTIVE PROTEIN: CRP: <0.8 mg/dL (ref <1.0)

## 2019-05-21 LAB — PROTIME-INR
INR: 0.9 (ref 0.8–1.2)
Prothrombin Time: 12 seconds (ref 11.4–15.2)

## 2019-05-21 LAB — APTT: aPTT: 33 seconds (ref 24–36)

## 2019-05-21 NOTE — Patient Instructions (Addendum)
Your procedure is scheduled on: Wednesday 06/03/19 Report to DAY SURGERY DEPARTMENT LOCATED ON 2ND FLOOR MEDICAL MALL ENTRANCE. To find out your arrival time please call [phone] between 1PM - 3PM on Tuesday 06/02/19.  Remember: Instructions that are not followed completely may result in serious medical risk, up to and including death, or upon the discretion of your surgeon and anesthesiologist your surgery may need to be rescheduled.     _X__ 1. Do not eat food after midnight the night before your procedure.                 No gum chewing or hard candies. You may drink clear liquids up to 2 hours                 before you are scheduled to arrive for your surgery- DO not drink clear                 liquids within 2 hours of the start of your surgery.                 Clear Liquids include:  water, apple juice without pulp, clear carbohydrate                 drink such as Clearfast or Gatorade, Black Coffee or Tea (Do not add                 anything to coffee or tea). Diabetics water only   __X__2.  On the morning of surgery brush your teeth with toothpaste and water, you                 may rinse your mouth with mouthwash if you wish.  Do not swallow any              toothpaste of mouthwash.     _X__ 3.  No Alcohol for 24 hours before or after surgery.   _X__ 4.  Do Not Smoke or use e-cigarettes For 24 Hours Prior to Your Surgery.                 Do not use any chewable tobacco products for at least 6 hours prior to                 surgery.  ____  5.  Bring all medications with you on the day of surgery if instructed.   __X__  6.  Notify your doctor if there is any change in your medical condition      (cold, fever, infections).     Do not wear jewelry, make-up, hairpins, clips or nail polish. Do not wear lotions, powders, or perfumes.  Do not shave 48 hours prior to surgery. Men may shave face and neck. Do not bring valuables to the hospital.    [facility] is not responsible  for any belongings or valuables.  Contacts, dentures/partials or body piercings may not be worn into surgery. Bring a case for your contacts, glasses or hearing aids, a denture cup will be supplied. Leave your suitcase in the car. After surgery it may be brought to your room. For patients admitted to the hospital, discharge time is determined by your treatment team.   Patients discharged the day of surgery will not be allowed to drive home.   Please read over the following fact sheets that you were given:   MRSA Information  __X__ Take these medicines the morning of surgery with A SIP OF  WATER:    1. amLODipine (NORVASC  2. carvedilol (COREG  3.   4.  5.  6.  ____ Fleet Enema (as directed)   __X__ Use CHG Soap/SAGE wipes as directed  ____ Use inhalers on the day of surgery  __X__ Stop metformin/Janumet/Farxiga 2 days prior to surgery    ____ Take 1/2 of usual insulin dose the night before surgery. No insulin the morning          of surgery.   __X__ Stop Blood Thinners Coumadin/Plavix/Xarelto/Pleta/Pradaxa/Eliquis/Effient/Aspirin  on   Or contact your Surgeon, Cardiologist or Medical Doctor regarding  ability to stop your blood thinners  __X__ Stop Anti-inflammatories 7 days before surgery such as Advil, Ibuprofen, Motrin,  BC or Goodies Powder, Naprosyn, Naproxen, Aleve,    STOP MELOXICAM 7 DAYS BEFORE SURGERY  __X__ Stop all herbal supplements, fish oil or vitamin E until after surgery.    ____ Bring C-Pap to the hospital.    If you were given Ensure or Gatoraid G2 drink you will need to drink that by 4:30 am for a 6:00 am arrival or 3 hours before arrival if not 6:oo am arrival time.

## 2019-05-22 LAB — URINE CULTURE
Culture: NO GROWTH
Special Requests: NORMAL

## 2019-05-23 LAB — HEMOGLOBIN A1C
Hgb A1c MFr Bld: 6 % — ABNORMAL HIGH (ref 4.8–5.6)
Mean Plasma Glucose: 125.5 mg/dL

## 2019-05-29 ENCOUNTER — Other Ambulatory Visit: Payer: Self-pay

## 2019-05-29 ENCOUNTER — Other Ambulatory Visit
Admission: RE | Admit: 2019-05-29 | Discharge: 2019-05-29 | Disposition: A | Discharge status: Home / Self Care | Payer: BC Managed Care – PPO | Source: Ambulatory Visit | Attending: Orthopedic Surgery | Admitting: Orthopedic Surgery

## 2019-05-29 DIAGNOSIS — Z1159 Encounter for screening for other viral diseases: Secondary | ICD-10-CM | POA: Insufficient documentation

## 2019-05-30 LAB — SARS CORONAVIRUS 2 (TAT 6-24 HRS): SARS Coronavirus 2: NEGATIVE

## 2019-06-02 MED ORDER — CEFAZOLIN SODIUM-DEXTROSE 2-4 GM/100ML-% IV SOLN: 2.0000 g | INTRAVENOUS | Status: DC

## 2019-06-02 MED ORDER — TRANEXAMIC ACID-NACL 1000-0.7 MG/100ML-% IV SOLN
1000.0000 mg | INTRAVENOUS | Status: DC
  Filled 2019-06-02: qty 100

## 2019-06-03 ENCOUNTER — Inpatient Hospital Stay
Admission: RE | Admit: 2019-06-03 | Discharge: 2019-06-05 | DRG: 470 | Disposition: A | Discharge status: Home / Self Care | Payer: BC Managed Care – PPO | Attending: Orthopedic Surgery | Admitting: Orthopedic Surgery

## 2019-06-03 ENCOUNTER — Encounter: Payer: Self-pay | Admitting: *Deleted

## 2019-06-03 ENCOUNTER — Inpatient Hospital Stay: Payer: BC Managed Care – PPO | Admitting: Anesthesiology

## 2019-06-03 ENCOUNTER — Inpatient Hospital Stay: Payer: BC Managed Care – PPO

## 2019-06-03 ENCOUNTER — Other Ambulatory Visit: Payer: Self-pay

## 2019-06-03 ENCOUNTER — Encounter
Admission: RE | Disposition: A | Discharge status: Home / Self Care | Payer: Self-pay | Source: Home / Self Care | Attending: Orthopedic Surgery

## 2019-06-03 DIAGNOSIS — Z6839 Body mass index (BMI) 39.0-39.9, adult: Secondary | ICD-10-CM | POA: Diagnosis not present

## 2019-06-03 DIAGNOSIS — M1612 Unilateral primary osteoarthritis, left hip: Principal | ICD-10-CM | POA: Diagnosis present

## 2019-06-03 DIAGNOSIS — Z801 Family history of malignant neoplasm of trachea, bronchus and lung: Secondary | ICD-10-CM

## 2019-06-03 DIAGNOSIS — K219 Gastro-esophageal reflux disease without esophagitis: Secondary | ICD-10-CM | POA: Diagnosis present

## 2019-06-03 DIAGNOSIS — G8929 Other chronic pain: Secondary | ICD-10-CM | POA: Diagnosis present

## 2019-06-03 DIAGNOSIS — Z888 Allergy status to other drugs, medicaments and biological substances status: Secondary | ICD-10-CM | POA: Diagnosis not present

## 2019-06-03 DIAGNOSIS — Z955 Presence of coronary angioplasty implant and graft: Secondary | ICD-10-CM | POA: Diagnosis not present

## 2019-06-03 DIAGNOSIS — I252 Old myocardial infarction: Secondary | ICD-10-CM | POA: Diagnosis not present

## 2019-06-03 DIAGNOSIS — I2510 Atherosclerotic heart disease of native coronary artery without angina pectoris: Secondary | ICD-10-CM | POA: Diagnosis present

## 2019-06-03 DIAGNOSIS — E119 Type 2 diabetes mellitus without complications: Secondary | ICD-10-CM | POA: Diagnosis present

## 2019-06-03 DIAGNOSIS — Z8249 Family history of ischemic heart disease and other diseases of the circulatory system: Secondary | ICD-10-CM

## 2019-06-03 DIAGNOSIS — Z8673 Personal history of transient ischemic attack (TIA), and cerebral infarction without residual deficits: Secondary | ICD-10-CM | POA: Diagnosis not present

## 2019-06-03 DIAGNOSIS — Z87891 Personal history of nicotine dependence: Secondary | ICD-10-CM | POA: Diagnosis not present

## 2019-06-03 DIAGNOSIS — I10 Essential (primary) hypertension: Secondary | ICD-10-CM | POA: Diagnosis present

## 2019-06-03 DIAGNOSIS — Z7984 Long term (current) use of oral hypoglycemic drugs: Secondary | ICD-10-CM | POA: Diagnosis not present

## 2019-06-03 DIAGNOSIS — Z96649 Presence of unspecified artificial hip joint: Secondary | ICD-10-CM

## 2019-06-03 DIAGNOSIS — Z96642 Presence of left artificial hip joint: Secondary | ICD-10-CM

## 2019-06-03 DIAGNOSIS — N400 Enlarged prostate without lower urinary tract symptoms: Secondary | ICD-10-CM | POA: Diagnosis present

## 2019-06-03 DIAGNOSIS — Z79899 Other long term (current) drug therapy: Secondary | ICD-10-CM | POA: Diagnosis not present

## 2019-06-03 HISTORY — PX: TOTAL HIP ARTHROPLASTY: SHX124

## 2019-06-03 LAB — ABO/RH: ABO/RH(D): O POS

## 2019-06-03 LAB — GLUCOSE, CAPILLARY
Glucose-Capillary: 137 mg/dL — ABNORMAL HIGH (ref 70–99)
Glucose-Capillary: 192 mg/dL — ABNORMAL HIGH (ref 70–99)
Glucose-Capillary: 205 mg/dL — ABNORMAL HIGH (ref 70–99)
Glucose-Capillary: 194 mg/dL — ABNORMAL HIGH (ref 70–99)

## 2019-06-03 SURGERY — ARTHROPLASTY, HIP, TOTAL,POSTERIOR APPROACH
Anesthesia: Spinal | Laterality: Left

## 2019-06-03 MED ORDER — CARVEDILOL 3.125 MG PO TABS
6.2500 mg | ORAL_TABLET | Freq: Two times a day (BID) | ORAL | Status: DC
  Administered 2019-06-04 (×2): 6.25 mg via ORAL
  Filled 2019-06-03 (×3): qty 2

## 2019-06-03 MED ORDER — BISACODYL 10 MG RE SUPP
10.0000 mg | Freq: Every day | RECTAL | Status: DC | PRN
  Filled 2019-06-03: qty 1

## 2019-06-03 MED ORDER — DEXAMETHASONE SODIUM PHOSPHATE 10 MG/ML IJ SOLN
8.0000 mg | Freq: Once | INTRAMUSCULAR | Status: DC
  Administered 2019-06-03: 8 mg via INTRAVENOUS

## 2019-06-03 MED ORDER — TRAMADOL HCL 50 MG PO TABS
50.0000 mg | ORAL_TABLET | ORAL | Status: DC | PRN
  Administered 2019-06-03 – 2019-06-05 (×2): 50 mg via ORAL
  Filled 2019-06-03 (×2): qty 1
  Filled 2019-06-03: qty 2

## 2019-06-03 MED ORDER — GABAPENTIN 300 MG PO CAPS
ORAL_CAPSULE | ORAL | Status: AC
  Administered 2019-06-03: 300 mg via ORAL
  Filled 2019-06-03: qty 1

## 2019-06-03 MED ORDER — ACETAMINOPHEN 325 MG PO TABS: 325.0000 mg | ORAL_TABLET | Freq: Four times a day (QID) | ORAL | Status: DC | PRN

## 2019-06-03 MED ORDER — CHLORHEXIDINE GLUCONATE 4 % EX LIQD: 60.0000 mL | Freq: Once | CUTANEOUS | Status: DC

## 2019-06-03 MED ORDER — PROPOFOL 10 MG/ML IV BOLUS
INTRAVENOUS | Status: AC
  Filled 2019-06-03: qty 20

## 2019-06-03 MED ORDER — OXYCODONE HCL 5 MG PO TABS
10.0000 mg | ORAL_TABLET | ORAL | Status: DC | PRN
  Administered 2019-06-03 – 2019-06-05 (×7): 10 mg via ORAL
  Filled 2019-06-03 (×8): qty 2

## 2019-06-03 MED ORDER — SODIUM CHLORIDE 0.9 % IV SOLN
INTRAVENOUS | Status: DC
  Administered 2019-06-03 (×2): via INTRAVENOUS

## 2019-06-03 MED ORDER — IRBESARTAN 150 MG PO TABS
75.0000 mg | ORAL_TABLET | Freq: Every day | ORAL | Status: DC
  Administered 2019-06-03: 14:00:00 75 mg via ORAL
  Filled 2019-06-03 (×2): qty 1

## 2019-06-03 MED ORDER — DEXAMETHASONE SODIUM PHOSPHATE 10 MG/ML IJ SOLN
INTRAMUSCULAR | Status: AC
  Administered 2019-06-03: 07:00:00 8 mg via INTRAVENOUS
  Filled 2019-06-03: qty 1

## 2019-06-03 MED ORDER — DIPHENHYDRAMINE HCL 12.5 MG/5ML PO ELIX: 12.5000 mg | ORAL_SOLUTION | ORAL | Status: DC | PRN

## 2019-06-03 MED ORDER — HYDROMORPHONE HCL 1 MG/ML IJ SOLN: 0.5000 mg | INTRAMUSCULAR | Status: DC | PRN

## 2019-06-03 MED ORDER — ONDANSETRON HCL 4 MG/2ML IJ SOLN
INTRAMUSCULAR | Status: DC | PRN
  Administered 2019-06-03: 4 mg via INTRAVENOUS

## 2019-06-03 MED ORDER — INSULIN ASPART 100 UNIT/ML ~~LOC~~ SOLN
0.0000 [IU] | Freq: Three times a day (TID) | SUBCUTANEOUS | Status: DC
  Administered 2019-06-03: 5 [IU] via SUBCUTANEOUS
  Administered 2019-06-04 (×3): 2 [IU] via SUBCUTANEOUS
  Filled 2019-06-03 (×4): qty 1

## 2019-06-03 MED ORDER — ALUM & MAG HYDROXIDE-SIMETH 200-200-20 MG/5ML PO SUSP: 30.0000 mL | ORAL | Status: DC | PRN

## 2019-06-03 MED ORDER — ONDANSETRON HCL 4 MG/2ML IJ SOLN: 4.0000 mg | Freq: Four times a day (QID) | INTRAMUSCULAR | Status: DC | PRN

## 2019-06-03 MED ORDER — PHENOL 1.4 % MT LIQD
1.0000 | OROMUCOSAL | Status: DC | PRN
  Filled 2019-06-03: qty 177

## 2019-06-03 MED ORDER — METFORMIN HCL 500 MG PO TABS
1000.0000 mg | ORAL_TABLET | Freq: Two times a day (BID) | ORAL | Status: DC
  Administered 2019-06-03 – 2019-06-05 (×4): 1000 mg via ORAL
  Filled 2019-06-03 (×4): qty 2

## 2019-06-03 MED ORDER — SODIUM CHLORIDE 0.9 % IV SOLN
INTRAVENOUS | Status: DC | PRN
  Administered 2019-06-03: 08:00:00 30 ug/min via INTRAVENOUS

## 2019-06-03 MED ORDER — FENTANYL CITRATE (PF) 100 MCG/2ML IJ SOLN
INTRAMUSCULAR | Status: AC
  Filled 2019-06-03: qty 2

## 2019-06-03 MED ORDER — SODIUM CHLORIDE 0.9 % IV SOLN
INTRAVENOUS | Status: DC | PRN
  Administered 2019-06-03 (×2): via INTRAVENOUS

## 2019-06-03 MED ORDER — NEOMYCIN-POLYMYXIN B GU 40-200000 IR SOLN
Status: DC | PRN
  Administered 2019-06-03: 14 mL

## 2019-06-03 MED ORDER — FAMOTIDINE 20 MG PO TABS
ORAL_TABLET | ORAL | Status: AC
  Administered 2019-06-03: 20 mg via ORAL
  Filled 2019-06-03: qty 1

## 2019-06-03 MED ORDER — ONDANSETRON HCL 4 MG PO TABS: 4.0000 mg | ORAL_TABLET | Freq: Four times a day (QID) | ORAL | Status: DC | PRN

## 2019-06-03 MED ORDER — PROPOFOL 10 MG/ML IV BOLUS
INTRAVENOUS | Status: DC | PRN
  Administered 2019-06-03: 60 mg via INTRAVENOUS
  Administered 2019-06-03 (×2): 30 mg via INTRAVENOUS

## 2019-06-03 MED ORDER — TRANEXAMIC ACID-NACL 1000-0.7 MG/100ML-% IV SOLN
1000.0000 mg | Freq: Once | INTRAVENOUS | Status: AC
  Administered 2019-06-03: 1000 mg via INTRAVENOUS
  Filled 2019-06-03: qty 100

## 2019-06-03 MED ORDER — PROPOFOL 500 MG/50ML IV EMUL
INTRAVENOUS | Status: AC
  Filled 2019-06-03: qty 50

## 2019-06-03 MED ORDER — MENTHOL 3 MG MT LOZG
1.0000 | LOZENGE | OROMUCOSAL | Status: DC | PRN
  Filled 2019-06-03: qty 9

## 2019-06-03 MED ORDER — ENSURE ENLIVE PO LIQD: 296.0000 mL | Freq: Once | ORAL | Status: DC

## 2019-06-03 MED ORDER — BUPIVACAINE HCL (PF) 0.5 % IJ SOLN
INTRAMUSCULAR | Status: DC | PRN
  Administered 2019-06-03: 2.5 mL

## 2019-06-03 MED ORDER — VITAMIN B-12 1000 MCG PO TABS
2000.0000 ug | ORAL_TABLET | Freq: Two times a day (BID) | ORAL | Status: DC
  Administered 2019-06-03 – 2019-06-05 (×4): 2000 ug via ORAL
  Filled 2019-06-03 (×5): qty 2

## 2019-06-03 MED ORDER — GLYCOPYRROLATE 0.2 MG/ML IJ SOLN
INTRAMUSCULAR | Status: DC | PRN
  Administered 2019-06-03: 0.2 mg via INTRAVENOUS

## 2019-06-03 MED ORDER — OXYCODONE HCL 5 MG PO TABS
5.0000 mg | ORAL_TABLET | ORAL | Status: DC | PRN
  Administered 2019-06-04: 5 mg via ORAL
  Filled 2019-06-03 (×2): qty 1

## 2019-06-03 MED ORDER — PROPOFOL 10 MG/ML IV BOLUS
INTRAVENOUS | Status: AC
  Filled 2019-06-03: qty 40

## 2019-06-03 MED ORDER — METOCLOPRAMIDE HCL 10 MG PO TABS: 5.0000 mg | ORAL_TABLET | Freq: Three times a day (TID) | ORAL | Status: DC | PRN

## 2019-06-03 MED ORDER — FLEET ENEMA 7-19 GM/118ML RE ENEM: 1.0000 | ENEMA | Freq: Once | RECTAL | Status: DC | PRN

## 2019-06-03 MED ORDER — FENTANYL CITRATE (PF) 100 MCG/2ML IJ SOLN
INTRAMUSCULAR | Status: DC | PRN
  Administered 2019-06-03: 50 ug via INTRAVENOUS
  Administered 2019-06-03 (×2): 25 ug via INTRAVENOUS

## 2019-06-03 MED ORDER — AMLODIPINE BESYLATE 5 MG PO TABS
5.0000 mg | ORAL_TABLET | Freq: Every day | ORAL | Status: DC
  Filled 2019-06-03: qty 1

## 2019-06-03 MED ORDER — CEFAZOLIN SODIUM-DEXTROSE 2-3 GM-%(50ML) IV SOLR
INTRAVENOUS | Status: DC | PRN
  Administered 2019-06-03: 2 g via INTRAVENOUS

## 2019-06-03 MED ORDER — OXYCODONE HCL 5 MG/5ML PO SOLN: 5.0000 mg | Freq: Once | ORAL | Status: AC | PRN

## 2019-06-03 MED ORDER — CELECOXIB 200 MG PO CAPS
200.0000 mg | ORAL_CAPSULE | Freq: Two times a day (BID) | ORAL | Status: DC
  Administered 2019-06-03 – 2019-06-05 (×5): 200 mg via ORAL
  Filled 2019-06-03 (×5): qty 1

## 2019-06-03 MED ORDER — LIDOCAINE HCL (CARDIAC) PF 100 MG/5ML IV SOSY
PREFILLED_SYRINGE | INTRAVENOUS | Status: DC | PRN
  Administered 2019-06-03: 50 mg via INTRAVENOUS

## 2019-06-03 MED ORDER — RISAQUAD PO CAPS
1.0000 | ORAL_CAPSULE | Freq: Every day | ORAL | Status: DC
  Administered 2019-06-03 – 2019-06-05 (×3): 1 via ORAL
  Filled 2019-06-03 (×3): qty 1

## 2019-06-03 MED ORDER — TRANEXAMIC ACID-NACL 1000-0.7 MG/100ML-% IV SOLN
INTRAVENOUS | Status: DC | PRN
  Administered 2019-06-03: 1000 mg via INTRAVENOUS

## 2019-06-03 MED ORDER — ACETAMINOPHEN 10 MG/ML IV SOLN
1000.0000 mg | Freq: Four times a day (QID) | INTRAVENOUS | Status: AC
  Administered 2019-06-03 – 2019-06-04 (×4): 1000 mg via INTRAVENOUS
  Filled 2019-06-03 (×5): qty 100

## 2019-06-03 MED ORDER — NITROGLYCERIN 0.4 MG SL SUBL: 0.4000 mg | SUBLINGUAL_TABLET | SUBLINGUAL | Status: DC | PRN

## 2019-06-03 MED ORDER — BUPIVACAINE HCL (PF) 0.5 % IJ SOLN
INTRAMUSCULAR | Status: AC
  Filled 2019-06-03: qty 10

## 2019-06-03 MED ORDER — METOCLOPRAMIDE HCL 5 MG/ML IJ SOLN: 5.0000 mg | Freq: Three times a day (TID) | INTRAMUSCULAR | Status: DC | PRN

## 2019-06-03 MED ORDER — FAMOTIDINE 20 MG PO TABS
20.0000 mg | ORAL_TABLET | Freq: Once | ORAL | Status: DC
  Administered 2019-06-03: 20 mg via ORAL

## 2019-06-03 MED ORDER — PROPOFOL 500 MG/50ML IV EMUL
INTRAVENOUS | Status: DC | PRN
  Administered 2019-06-03: 75 ug/kg/min via INTRAVENOUS

## 2019-06-03 MED ORDER — PHENYLEPHRINE HCL (PRESSORS) 10 MG/ML IV SOLN
INTRAVENOUS | Status: AC
  Filled 2019-06-03: qty 1

## 2019-06-03 MED ORDER — EPHEDRINE SULFATE 50 MG/ML IJ SOLN
INTRAMUSCULAR | Status: DC | PRN
  Administered 2019-06-03 (×3): 5 mg via INTRAVENOUS

## 2019-06-03 MED ORDER — FENTANYL CITRATE (PF) 100 MCG/2ML IJ SOLN
INTRAMUSCULAR | Status: AC
  Administered 2019-06-03: 25 ug via INTRAVENOUS
  Filled 2019-06-03: qty 2

## 2019-06-03 MED ORDER — MIDAZOLAM HCL 2 MG/2ML IJ SOLN
INTRAMUSCULAR | Status: AC
  Filled 2019-06-03: qty 2

## 2019-06-03 MED ORDER — LIDOCAINE HCL (PF) 2 % IJ SOLN
INTRAMUSCULAR | Status: AC
  Filled 2019-06-03: qty 10

## 2019-06-03 MED ORDER — GABAPENTIN 300 MG PO CAPS
300.0000 mg | ORAL_CAPSULE | Freq: Every day | ORAL | Status: DC
  Administered 2019-06-03 – 2019-06-04 (×2): 300 mg via ORAL
  Filled 2019-06-03 (×2): qty 1

## 2019-06-03 MED ORDER — METOCLOPRAMIDE HCL 10 MG PO TABS
10.0000 mg | ORAL_TABLET | Freq: Three times a day (TID) | ORAL | Status: DC
  Administered 2019-06-03 – 2019-06-05 (×7): 10 mg via ORAL
  Filled 2019-06-03 (×7): qty 1

## 2019-06-03 MED ORDER — GLYCOPYRROLATE 0.2 MG/ML IJ SOLN
INTRAMUSCULAR | Status: AC
  Filled 2019-06-03: qty 1

## 2019-06-03 MED ORDER — ENOXAPARIN SODIUM 30 MG/0.3ML ~~LOC~~ SOLN
30.0000 mg | Freq: Two times a day (BID) | SUBCUTANEOUS | Status: DC
  Administered 2019-06-04 – 2019-06-05 (×3): 30 mg via SUBCUTANEOUS
  Filled 2019-06-03 (×3): qty 0.3

## 2019-06-03 MED ORDER — FERROUS SULFATE 325 (65 FE) MG PO TABS
325.0000 mg | ORAL_TABLET | Freq: Two times a day (BID) | ORAL | Status: DC
  Administered 2019-06-03 – 2019-06-05 (×4): 325 mg via ORAL
  Filled 2019-06-03 (×4): qty 1

## 2019-06-03 MED ORDER — SODIUM CHLORIDE 0.9 % IV SOLN
INTRAVENOUS | Status: DC
  Administered 2019-06-03: 07:00:00 via INTRAVENOUS

## 2019-06-03 MED ORDER — OXYCODONE HCL 5 MG PO TABS
ORAL_TABLET | ORAL | Status: AC
  Administered 2019-06-03: 5 mg via ORAL
  Filled 2019-06-03: qty 1

## 2019-06-03 MED ORDER — CELECOXIB 200 MG PO CAPS
ORAL_CAPSULE | ORAL | Status: AC
  Administered 2019-06-03: 07:00:00 400 mg via ORAL
  Filled 2019-06-03: qty 2

## 2019-06-03 MED ORDER — PANTOPRAZOLE SODIUM 40 MG PO TBEC
40.0000 mg | DELAYED_RELEASE_TABLET | Freq: Two times a day (BID) | ORAL | Status: DC
  Administered 2019-06-03 – 2019-06-05 (×5): 40 mg via ORAL
  Filled 2019-06-03 (×5): qty 1

## 2019-06-03 MED ORDER — ATORVASTATIN CALCIUM 20 MG PO TABS
80.0000 mg | ORAL_TABLET | Freq: Every day | ORAL | Status: DC
  Administered 2019-06-03 – 2019-06-04 (×2): 80 mg via ORAL
  Filled 2019-06-03 (×2): qty 4

## 2019-06-03 MED ORDER — CELECOXIB 200 MG PO CAPS
400.0000 mg | ORAL_CAPSULE | Freq: Once | ORAL | Status: AC
  Administered 2019-06-03: 400 mg via ORAL

## 2019-06-03 MED ORDER — GABAPENTIN 300 MG PO CAPS
300.0000 mg | ORAL_CAPSULE | Freq: Once | ORAL | Status: DC
  Administered 2019-06-03: 300 mg via ORAL

## 2019-06-03 MED ORDER — ACETAMINOPHEN 10 MG/ML IV SOLN
INTRAVENOUS | Status: DC | PRN
  Administered 2019-06-03: 1000 mg via INTRAVENOUS

## 2019-06-03 MED ORDER — HYDROCHLOROTHIAZIDE 25 MG PO TABS
12.5000 mg | ORAL_TABLET | Freq: Every day | ORAL | Status: DC
  Administered 2019-06-03: 12.5 mg via ORAL
  Filled 2019-06-03 (×2): qty 1

## 2019-06-03 MED ORDER — EPHEDRINE SULFATE 50 MG/ML IJ SOLN
INTRAMUSCULAR | Status: AC
  Filled 2019-06-03: qty 1

## 2019-06-03 MED ORDER — MIDAZOLAM HCL 5 MG/5ML IJ SOLN
INTRAMUSCULAR | Status: DC | PRN
  Administered 2019-06-03: 2 mg via INTRAVENOUS

## 2019-06-03 MED ORDER — ACETAMINOPHEN 10 MG/ML IV SOLN
INTRAVENOUS | Status: AC
  Filled 2019-06-03: qty 100

## 2019-06-03 MED ORDER — PHENYLEPHRINE HCL (PRESSORS) 10 MG/ML IV SOLN
INTRAVENOUS | Status: DC | PRN
  Administered 2019-06-03 (×2): 100 ug via INTRAVENOUS

## 2019-06-03 MED ORDER — SENNOSIDES-DOCUSATE SODIUM 8.6-50 MG PO TABS
1.0000 | ORAL_TABLET | Freq: Two times a day (BID) | ORAL | Status: DC
  Administered 2019-06-03 – 2019-06-04 (×4): 1 via ORAL
  Filled 2019-06-03 (×4): qty 1

## 2019-06-03 MED ORDER — TETRACAINE HCL 1 % IJ SOLN
INTRAMUSCULAR | Status: DC | PRN
  Administered 2019-06-03: 5 mg via INTRASPINAL

## 2019-06-03 MED ORDER — CEFAZOLIN SODIUM-DEXTROSE 2-4 GM/100ML-% IV SOLN
2.0000 g | Freq: Four times a day (QID) | INTRAVENOUS | Status: AC
  Administered 2019-06-03 – 2019-06-04 (×4): 2 g via INTRAVENOUS
  Filled 2019-06-03 (×4): qty 100

## 2019-06-03 MED ORDER — OXYCODONE HCL 5 MG PO TABS
5.0000 mg | ORAL_TABLET | Freq: Once | ORAL | Status: AC | PRN
  Administered 2019-06-03: 5 mg via ORAL

## 2019-06-03 MED ORDER — CEFAZOLIN SODIUM-DEXTROSE 2-4 GM/100ML-% IV SOLN
INTRAVENOUS | Status: AC
  Filled 2019-06-03: qty 100

## 2019-06-03 MED ORDER — MAGNESIUM HYDROXIDE 400 MG/5ML PO SUSP
30.0000 mL | Freq: Every day | ORAL | Status: DC
  Administered 2019-06-04: 30 mL via ORAL
  Filled 2019-06-03: qty 30

## 2019-06-03 MED ORDER — FENTANYL CITRATE (PF) 100 MCG/2ML IJ SOLN
25.0000 ug | INTRAMUSCULAR | Status: DC | PRN
  Administered 2019-06-03 (×4): 25 ug via INTRAVENOUS

## 2019-06-03 SURGICAL SUPPLY — 54 items
BLADE DRUM FLTD (BLADE) ×3 IMPLANT
BLADE SAW 90X25X1.19 OSCILLAT (BLADE) ×3 IMPLANT
CANISTER SUCT 1200ML W/VALVE (MISCELLANEOUS) ×3 IMPLANT
CANISTER SUCT 3000ML PPV (MISCELLANEOUS) ×6 IMPLANT
CARTRIDGE OIL MAESTRO DRILL (MISCELLANEOUS) ×1 IMPLANT
COVER WAND RF STERILE (DRAPES) ×3 IMPLANT
CUP ACETBLR 54 OD 100 SERIES (Hips) ×3 IMPLANT
DIFFUSER DRILL AIR PNEUMATIC (MISCELLANEOUS) ×3 IMPLANT
DRAPE 3/4 80X56 (DRAPES) ×3 IMPLANT
DRAPE INCISE IOBAN 66X60 STRL (DRAPES) ×3 IMPLANT
DRSG DERMACEA 8X12 NADH (GAUZE/BANDAGES/DRESSINGS) ×3 IMPLANT
DRSG OPSITE POSTOP 4X12 (GAUZE/BANDAGES/DRESSINGS) ×3 IMPLANT
DRSG OPSITE POSTOP 4X14 (GAUZE/BANDAGES/DRESSINGS) IMPLANT
DRSG TEGADERM 4X4.75 (GAUZE/BANDAGES/DRESSINGS) ×3 IMPLANT
DURAPREP 26ML APPLICATOR (WOUND CARE) ×3 IMPLANT
GLOVE BIOGEL M STRL SZ7.5 (GLOVE) ×6 IMPLANT
GLOVE INDICATOR 8.0 STRL GRN (GLOVE) ×3 IMPLANT
GOWN STRL REUS W/ TWL LRG LVL3 (GOWN DISPOSABLE) ×2 IMPLANT
GOWN STRL REUS W/TWL LRG LVL3 (GOWN DISPOSABLE) ×4
HEAD M SROM 36MM PLUS 1.5 (Hips) ×1 IMPLANT
HEMOVAC 400CC 10FR (MISCELLANEOUS) ×3 IMPLANT
HOLDER FOLEY CATH W/STRAP (MISCELLANEOUS) ×3 IMPLANT
HOOD PEEL AWAY FLYTE STAYCOOL (MISCELLANEOUS) ×6 IMPLANT
KIT TURNOVER KIT A (KITS) ×3 IMPLANT
LINER NEUTRAL 36ID 54OD (Liner) ×3 IMPLANT
MANIFOLD NEPTUNE II (INSTRUMENTS) ×3 IMPLANT
NDL SAFETY ECLIPSE 18X1.5 (NEEDLE) ×1 IMPLANT
NEEDLE HYPO 18GX1.5 SHARP (NEEDLE) ×2
NS IRRIG 500ML POUR BTL (IV SOLUTION) ×3 IMPLANT
OIL CARTRIDGE MAESTRO DRILL (MISCELLANEOUS) ×3
PACK HIP PROSTHESIS (MISCELLANEOUS) ×3 IMPLANT
PENCIL SMOKE ULTRAEVAC 22 CON (MISCELLANEOUS) ×3 IMPLANT
PIN STEIN THRED 5/32 (Pin) ×3 IMPLANT
PULSAVAC PLUS IRRIG FAN TIP (DISPOSABLE) ×3
SOL .9 NS 3000ML IRR  AL (IV SOLUTION) ×2
SOL .9 NS 3000ML IRR UROMATIC (IV SOLUTION) ×1 IMPLANT
SOL PREP PVP 2OZ (MISCELLANEOUS) ×3
SOLUTION PREP PVP 2OZ (MISCELLANEOUS) ×1 IMPLANT
SPONGE DRAIN TRACH 4X4 STRL 2S (GAUZE/BANDAGES/DRESSINGS) ×3 IMPLANT
SROM M HEAD 36MM PLUS 1.5 (Hips) ×3 IMPLANT
STAPLER SKIN PROX 35W (STAPLE) ×3 IMPLANT
STEM FEM CMNTLSS SM AML 13.5 (Hips) ×3 IMPLANT
SUT ETHIBOND #5 BRAIDED 30INL (SUTURE) ×3 IMPLANT
SUT VIC AB 0 CT1 36 (SUTURE) ×3 IMPLANT
SUT VIC AB 1 CT1 36 (SUTURE) ×6 IMPLANT
SUT VIC AB 2-0 CT1 (SUTURE) ×3 IMPLANT
SUT VIC AB 2-0 CT1 27 (SUTURE) ×2
SUT VIC AB 2-0 CT1 TAPERPNT 27 (SUTURE) ×1 IMPLANT
SYR 20ML LL LF (SYRINGE) ×3 IMPLANT
TAPE CLOTH 3X10 WHT NS LF (GAUZE/BANDAGES/DRESSINGS) ×3 IMPLANT
TAPE TRANSPORE STRL 2 31045 (GAUZE/BANDAGES/DRESSINGS) ×3 IMPLANT
TIP FAN IRRIG PULSAVAC PLUS (DISPOSABLE) ×1 IMPLANT
TOWEL OR 17X26 4PK STRL BLUE (TOWEL DISPOSABLE) ×3 IMPLANT
TRAY FOLEY MTR SLVR 16FR STAT (SET/KITS/TRAYS/PACK) ×3 IMPLANT

## 2019-06-03 NOTE — TOC Benefit Eligibility Note (Signed)
Transition of Care [facility]) Benefit Eligibility Note    Patient Details  Name: Daniel [name] MRN: [id number] Date of Birth: [birth year]   Medication/Dose: Enoxaparin 40mg  once daily for 14 days  Covered?: Yes  Prescription Coverage [facility]  Spoke with Person/Company/Phone Number:: Saralyn Pilar with [employer] at [phone]  Co-Pay: $26.46 estimated copay  Prior Approval: No  Deductible: Unmet    Dannette Barbara Phone Number: [phone] or [phone] 06/03/2019, 11:02 AM

## 2019-06-03 NOTE — Op Note (Signed)
OPERATIVE NOTE  DATE OF SURGERY:  06/03/2019  PATIENT NAME:  Daniel [name]   DOB: Mar 27, 1966  MRN: [id number]  PRE-OPERATIVE DIAGNOSIS: Degenerative arthrosis of the left hip, primary  POST-OPERATIVE DIAGNOSIS:  Same  PROCEDURE:  Left total hip arthroplasty  SURGEON:  Marciano Sequin. M.D.  ASSISTANT: Vance Peper, PA (present and scrubbed throughout the case, critical for assistance with exposure, retraction, instrumentation, and closure)  ANESTHESIA: spinal  ESTIMATED BLOOD LOSS: 400 mL  FLUIDS REPLACED: 1700 mL of crystalloid  DRAINS: 2 medium drains to a Hemovac reservoir  IMPLANTS UTILIZED: DePuy 13.5 mm small stature AML femoral stem, 54 mm OD Pinnacle 100 acetabular component, neutral Pinnacle Altrx polyethylene insert, and a 36 mm M-SPEC +1.5 mm hip ball  INDICATIONS FOR SURGERY: Daniel [name] is a 53 y.o. year old male with a long history of progressive hip and groin  pain. X-rays demonstrated severe degenerative changes. The patient had not seen any significant improvement despite conservative nonsurgical intervention. After discussion of the risks and benefits of surgical intervention, the patient expressed understanding of the risks benefits and agree with plans for total hip arthroplasty.   The risks, benefits, and alternatives were discussed at length including but not limited to the risks of infection, bleeding, nerve injury, stiffness, blood clots, the need for revision surgery, limb length inequality, dislocation, cardiopulmonary complications, among others, and they were willing to proceed.  PROCEDURE IN DETAIL: The patient was brought into the operating room and, after adequate spinal anesthesia was achieved, the patient was placed in a right lateral decubitus position. Axillary roll was placed and all bony prominences were well-padded. The patient's left hip was cleaned and prepped with alcohol and DuraPrep and draped in the usual sterile fashion. A "timeout" was  performed as per usual protocol. A lateral curvilinear incision was made gently curving towards the posterior superior iliac spine. The IT band was incised in line with the skin incision and the fibers of the gluteus maximus were split in line. The piriformis tendon was identified, skeletonized, and incised at its insertion to the proximal femur and reflected posteriorly. A T type posterior capsulotomy was performed.  The gluteal sling was incised so as to better mobilize the proximal femur.  Prior to dislocation of the femoral head, a threaded Steinmann pin was inserted through a separate stab incision into the pelvis superior to the acetabulum and bent in the form of a stylus so as to assess limb length and hip offset throughout the procedure. The femoral head was then dislocated posteriorly. Inspection of the femoral head demonstrated severe degenerative changes with full-thickness loss of articular cartilage. The femoral neck cut was performed using an oscillating saw. The anterior capsule was elevated off of the femoral neck using a periosteal elevator. Attention was then directed to the acetabulum. The remnant of the labrum was excised using electrocautery. Inspection of the acetabulum also demonstrated significant degenerative changes. The acetabulum was reamed in sequential fashion up to a 53 mm diameter. Good punctate bleeding bone was encountered. A 54 mm Pinnacle 100 acetabular component was positioned and impacted into place. Good scratch fit was appreciated. A neutral polyethylene trial was inserted.  Attention was then directed to the proximal femur. A hole for reaming of the proximal femoral canal was created using a high-speed burr. The femoral canal was reamed in sequential fashion up to a 13 mm diameter. This allowed for approximately 7 cm of scratch fit.  It was thus elected to ream up to  a 13.5 mm diameter to allow for a line to line fit.  Serial broaches were inserted up to a 13.5 mm small  stature femoral broach. Calcar region was planed and a trial reduction was performed using a 36 mm hip ball with a +1.5 mm neck length. Good equalization of limb lengths and hip offset was appreciated and excellent stability was noted both anteriorly and posteriorly. Trial components were removed. The acetabular shell was irrigated with copious amounts of normal saline with antibiotic solution and suctioned dry. A neutral Pinnacle Altrx polyethylene insert was positioned and impacted into place. Next, a 13.5 mm small stature AML femoral stem was positioned and impacted into place. Excellent scratch fit was appreciated. A trial reduction was again performed with a 36 mm hip ball with a +1.5 mm neck length. Again, good equalization of limb lengths was appreciated and excellent stability appreciated both anteriorly and posteriorly. The hip was then dislocated and the trial hip ball was removed. The Morse taper was cleaned and dried. A 36 mm M-SPEC hip ball with a +1.5 mm neck length was placed on the trunnion and impacted into place. The hip was then reduced and placed through range of motion. Excellent stability was appreciated both anteriorly and posteriorly.  The wound was irrigated with copious amounts of normal saline with antibiotic solution and suctioned dry. Good hemostasis was appreciated. The posterior capsulotomy was repaired using #5 Ethibond. Piriformis tendon was reapproximated to the undersurface of the gluteus medius tendon using #5 Ethibond. Two medium drains were placed in the wound bed and brought out through separate stab incisions to be attached to a Hemovac reservoir. The IT band was reapproximated using interrupted sutures of #1 Vicryl. Subcutaneous tissue was approximated using first #0 Vicryl followed by #2-0 Vicryl. The skin was closed with skin staples.  The patient tolerated the procedure well and was transported to the recovery room in stable condition.   Jena GaussJames P , Jr., M.D.

## 2019-06-03 NOTE — Anesthesia Post-op Follow-up Note (Signed)
Anesthesia QCDR form completed.        

## 2019-06-03 NOTE — Anesthesia Preprocedure Evaluation (Signed)
Anesthesia Evaluation  Patient identified by MRN, date of birth, ID band Patient awake    Reviewed: Allergy & Precautions, H&P , NPO status , Patient's Chart, lab work & pertinent test results  History of Anesthesia Complications Negative for: history of anesthetic complications  Airway Mallampati: III  TM Distance: >3 FB Neck ROM: full    Dental  (+) Chipped   Pulmonary former smoker,           Cardiovascular hypertension, (-) angina+ CAD, + Past MI and + Cardiac Stents (3 years ago)  (-) dysrhythmias      Neuro/Psych CVA (cannot smell or taste.  No weakness, numbness or paresthesias), Residual Symptoms negative psych ROS   GI/Hepatic GERD  Controlled,(+)     substance abuse  alcohol use,   Endo/Other  diabetes, Type 2, Oral Hypoglycemic AgentsMorbid obesity  Renal/GU      Musculoskeletal   Abdominal   Peds  Hematology negative hematology ROS (+)   Anesthesia Other Findings Past Medical History: No date: CAD (coronary artery disease)     Comment:  a. NSTEMI: 100% stenosis of the distal RCA --> DES               placed 5/17 CLEARED BY CARDIOLOGIST No date: Chronic low back pain     Comment:  a. s/p back surgery. No date: CVA (cerebral infarction)     Comment:  a. 02/2015 dyarthria 2/2 CVA involving the lateral aspect              of the precentral gyrus. No date: DM2 (diabetes mellitus, type 2) (HCC)     Comment:  a. pre-diabetic in the past. b. A1c 03/2016 elevated to               7.9. No date: ETOH abuse No date: GERD (gastroesophageal reflux disease) No date: History of echocardiogram     Comment:  a. Mild LVH, EF 55-60%, Definity contrast used-LV wall               motion could not be adequately assessed 03/2016: History of non-ST elevation myocardial infarction (NSTEMI)     Comment:  a. PCI: 3.5 x 24 mm Promus Premier DES to distal RCA No date: History of tobacco abuse No date: Hypertension  Comment:  a. 02/2015 echo: 55-65%, trace TR/MR. b. 03/2016: echo               with EF of 55-60%.  No date: Myocardial infarction [facility]) No date: Obesity No date: Ruptured disk No date: Stroke [facility])     Comment:  2016  Past Surgical History: No date: BACK SURGERY     Comment:  ruptured disk, L-S 05/2004: CARDIAC CATHETERIZATION     Comment:  minimal CAD 03/10/2016: CARDIAC CATHETERIZATION; N/A     Comment:  Procedure: Left Heart Cath and Coronary Angiography;                Surgeon: Kathleene Hazel, MD;  Location: [facility]               INVASIVE CV LAB;  Service: Cardiovascular;  Laterality:               N/A; 03/10/2016: CARDIAC CATHETERIZATION; N/A     Comment:  Procedure: Coronary Stent Intervention;  Surgeon:               Kathleene Hazel, MD;  Location: [facility] INVASIVE CV  LAB;  Service: Cardiovascular;  Laterality: N/A; 07/12/2016: CATARACT EXTRACTION W/PHACO; Left     Comment:  Procedure: CATARACT EXTRACTION PHACO AND INTRAOCULAR               LENS PLACEMENT (IOC);  Surgeon: Eulogio Bear, MD;                Location: [facility] ORS;  Service: Ophthalmology;  Laterality:              Left;  [place]  00:50 AP% 9.2 CDE 4.63 fluid pack lot #               [id number] No date: CORONARY ANGIOPLASTY     Comment:  STENT 5/17  BMI    Body Mass Index: 39.22 kg/m      Reproductive/Obstetrics negative OB ROS                             Anesthesia Physical Anesthesia Plan  ASA: III  Anesthesia Plan: Spinal   Post-op Pain Management:    Induction:   PONV Risk Score and Plan: Propofol infusion  Airway Management Planned: Natural Airway and Simple Face Mask  Additional Equipment:   Intra-op Plan:   Post-operative Plan:   Informed Consent: I have reviewed the patients History and Physical, chart, labs and discussed the procedure including the risks, benefits and alternatives for the proposed anesthesia with the patient or authorized  representative who has indicated his/her understanding and acceptance.     Dental Advisory Given  Plan Discussed with: Anesthesiologist and CRNA  Anesthesia Plan Comments:         Anesthesia Quick Evaluation

## 2019-06-03 NOTE — H&P (Signed)
The patient has been re-examined, and the chart reviewed, and there have been no interval changes to the documented history and physical.    The risks, benefits, and alternatives have been discussed at length. The patient expressed understanding of the risks benefits and agreed with plans for surgical intervention.   P. , Jr. M.D.    

## 2019-06-03 NOTE — TOC Progression Note (Signed)
Transition of Care [facility]) - Progression Note    Patient Details  Name: Daniel [name] MRN: [id number] Date of Birth: Oct 02, 1966  Transition of Care [facility]) CM/SW Contact  Su Hilt, RN Phone Number: 06/03/2019, 8:46 AM  Clinical Narrative:     Requested the price of Lovenox, will notify the patient once obtained       Expected Discharge Plan and Services                                                 Social Determinants of Health (SDOH) Interventions    Readmission Risk Interventions No flowsheet data found.

## 2019-06-03 NOTE — Evaluation (Signed)
Physical Therapy Evaluation Patient Details Name: Daniel CharonDavid G [name] MRN: [id number] DOB: 02-29-1966 Today's Date: 06/03/2019   History of Present Illness  Pt is a 53 yo male s/p elective L THA.  PMH incudes: CVA, CAD, NSTEMI, back Sx, DM, EtOH abuse, and HTN.  Clinical Impression  Pt presents with deficits in strength, transfers, mobility, gait, balance, and activity tolerance but performed well overall especially considering POD#0 status.  Pt was SBA with bed mobility tasks with min verbal cues for sequencing.  Pt was CGA with sit to/from stand transfers from an elevated EOB with cues for sequencing for hip precaution compliance.  Pt was able to amb 20' with a RW and CGA with slow cadence and antalgic step-to pattern but was steady without LOB.  Pt will benefit from HHPT services upon discharge to safely address above deficits for decreased caregiver assistance and eventual return to PLOF.      Follow Up Recommendations Home health PT    Equipment Recommendations  Rolling walker with 5" wheels;3in1 (PT);Other (comment)(Tub transfer bench)    Recommendations for Other Services       Precautions / Restrictions Precautions Precautions: Posterior Hip Precaution Booklet Issued: Yes (comment) Restrictions Weight Bearing Restrictions: Yes LLE Weight Bearing: Weight bearing as tolerated      Mobility  Bed Mobility Overal bed mobility: Needs Assistance Bed Mobility: Supine to Sit;Sit to Supine     Supine to sit: Supervision Sit to supine: Supervision   General bed mobility comments: Extra time and effort and min verbal cues for sequencing  Transfers Overall transfer level: Needs assistance Equipment used: Rolling walker (2 wheeled) Transfers: Sit to/from Stand Sit to Stand: Min guard         General transfer comment: Mod verbal and visual cues for sequencing to maintain hip precaution compliance  Ambulation/Gait Ambulation/Gait assistance: Min guard Gait Distance (Feet):  20 Feet Assistive device: Rolling walker (2 wheeled) Gait Pattern/deviations: Step-to pattern;Antalgic Gait velocity: decreased   General Gait Details: Slow, antalgic step-to gait pattern with cues for general sequencing but steady without LOB  Stairs            Wheelchair Mobility    Modified Rankin (Stroke Patients Only)       Balance Overall balance assessment: Needs assistance Sitting-balance support: No upper extremity supported;Feet supported Sitting balance-Leahy Scale: Normal     Standing balance support: Bilateral upper extremity supported Standing balance-Leahy Scale: Good                               Pertinent Vitals/Pain Pain Assessment: 0-10 Pain Score: 6  Pain Location: L hip Pain Descriptors / Indicators: Aching;Sore Pain Intervention(s): Monitored during session;Premedicated before session    Home Living Family/patient expects to be discharged to:: Private residence Living Arrangements: Spouse/significant other Available Help at Discharge: Family;Available 24 hours/day Type of Home: House Home Access: Stairs to enter Entrance Stairs-Rails: Right;Left(Too wide for both) Entrance Stairs-Number of Steps: 4 Home Layout: One level Home Equipment: None      Prior Function Level of Independence: Independent         Comments: Ind amb community distances without fall history, works FT as a Recruitment consultantbridge builder, no fall history, Ind with ADLs     Hand Dominance        Extremity/Trunk Assessment   Upper Extremity Assessment Upper Extremity Assessment: Overall WFL for tasks assessed    Lower Extremity Assessment Lower Extremity Assessment: Generalized weakness;LLE deficits/detail  LLE: Unable to fully assess due to pain LLE Sensation: WNL       Communication   Communication: No difficulties  Cognition Arousal/Alertness: Awake/alert Behavior During Therapy: WFL for tasks assessed/performed Overall Cognitive Status: Within  Functional Limits for tasks assessed                                        General Comments      Exercises Total Joint Exercises Ankle Circles/Pumps: AROM;Both;5 reps;10 reps Quad Sets: Strengthening;Both;10 reps Gluteal Sets: Strengthening;Both;10 reps Hip ABduction/ADduction: AROM;Both;5 reps Straight Leg Raises: AROM;Right;10 reps Long Arc Quad: Strengthening;Both;10 reps Knee Flexion: Strengthening;Both;10 reps Marching in Standing: AROM;Both;10 reps;Standing Other Exercises Other Exercises: HEP education/review per handout Other Exercises: Extensive hip precaution education and review with practice compliance during functional tasks Other Exercises: 90 deg left turn sequencing education and practice to avoid CKC L hip IR   Assessment/Plan    PT Assessment Patient needs continued PT services  PT Problem List Decreased strength;Decreased balance;Decreased mobility;Decreased knowledge of use of DME;Decreased knowledge of precautions;Decreased activity tolerance       PT Treatment Interventions DME instruction;Gait training;Stair training;Functional mobility training;Therapeutic activities;Therapeutic exercise;Patient/family education;Balance training    PT Goals (Current goals can be found in the Care Plan section)  Acute Rehab PT Goals Patient Stated Goal: To get back home and to be able to travel PT Goal Formulation: With patient Time For Goal Achievement: 06/16/19 Potential to Achieve Goals: Good    Frequency BID   Barriers to discharge        Co-evaluation               AM-PAC PT "6 Clicks" Mobility  Outcome Measure Help needed turning from your back to your side while in a flat bed without using bedrails?: A Little Help needed moving from lying on your back to sitting on the side of a flat bed without using bedrails?: A Little Help needed moving to and from a bed to a chair (including a wheelchair)?: A Little Help needed standing up  from a chair using your arms (e.g., wheelchair or bedside chair)?: A Little Help needed to walk in hospital room?: A Little Help needed climbing 3-5 steps with a railing? : A Little 6 Click Score: 18    End of Session Equipment Utilized During Treatment: Gait belt Activity Tolerance: Patient tolerated treatment well Patient left: in chair;with call bell/phone within reach;with chair alarm set;with SCD's reapplied;Other (comment)(Abd pillows in place) Nurse Communication: Mobility status PT Visit Diagnosis: Muscle weakness (generalized) (M62.81);Other abnormalities of gait and mobility (R26.89)    Time: 1448-1550 PT Time Calculation (min) (ACUTE ONLY): 62 min   Charges:   PT Evaluation $PT Eval Low Complexity: 1 Low PT Treatments $Therapeutic Exercise: 8-22 mins $Therapeutic Activity: 8-22 mins        D. Royetta Asal PT, DPT 06/03/19, 4:19 PM

## 2019-06-03 NOTE — Anesthesia Procedure Notes (Signed)
Spinal  Patient location during procedure: OR Start time: 06/03/2019 7:44 AM Staffing Anesthesiologist: Durenda Hurt, MD Resident/CRNA: Lowry Bowl, CRNA Performed: anesthesiologist  Preanesthetic Checklist Completed: patient identified, site marked, surgical consent, pre-op evaluation, timeout performed, IV checked, risks and benefits discussed and monitors and equipment checked Spinal Block Patient position: sitting Prep: DuraPrep Patient monitoring: heart rate, cardiac monitor, continuous pulse ox and blood pressure Approach: midline Location: L3-4 Injection technique: single-shot Needle Needle type: Whitacre  Needle gauge: 22 G Needle length: 9 cm Assessment Sensory level: T4 Additional Notes 1st attempt by Tamala Julian, CRNA.

## 2019-06-03 NOTE — Transfer of Care (Signed)
Immediate Anesthesia Transfer of Care Note  Patient: Daniel [name]  Procedure(s) Performed: TOTAL HIP ARTHROPLASTY (Left )  Patient Location: PACU  Anesthesia Type:Spinal  Level of Consciousness: awake, drowsy and patient cooperative  Airway & Oxygen Therapy: Patient Spontanous Breathing and Patient connected to face mask oxygen  Post-op Assessment: Report given to RN and Post -op Vital signs reviewed and stable  Post vital signs: Reviewed and stable  Last Vitals:  Vitals Value Taken Time  BP 109/69 06/03/19 1128  Temp    Pulse 57 06/03/19 1129  Resp 23 06/03/19 1129  SpO2 99 % 06/03/19 1129  Vitals shown include unvalidated device data.  Last Pain:  Vitals:   06/03/19 0619  TempSrc: Temporal  PainSc: 0-No pain         Complications: No apparent anesthesia complications

## 2019-06-04 LAB — GLUCOSE, CAPILLARY
Glucose-Capillary: 122 mg/dL — ABNORMAL HIGH (ref 70–99)
Glucose-Capillary: 126 mg/dL — ABNORMAL HIGH (ref 70–99)
Glucose-Capillary: 148 mg/dL — ABNORMAL HIGH (ref 70–99)
Glucose-Capillary: 121 mg/dL — ABNORMAL HIGH (ref 70–99)

## 2019-06-04 NOTE — Progress Notes (Signed)
Dr. Marry Guan notified on rounds that pt's HR ranging in 40-50s since yesterday. MD aware and instructed to give coreg this am as this is pt's baseline.

## 2019-06-04 NOTE — Discharge Summary (Signed)
Physician Discharge Summary  Patient ID: Daniel [name] MRN: [id number] DOB/AGE: 03/26/1966 53 y.o.  Admit date: 06/03/2019 Discharge date: 06/05/2019  Admission Diagnoses:  Primary Osteoarthritis of left hip  Discharge Diagnoses: Patient Active Problem List   Diagnosis Date Noted  . H/O total hip arthroplasty 06/03/2019  . Thrombocytopenia (HCC) 12/31/2018  . Left hip pain 12/15/2018  . Elevated ALT measurement 06/30/2018  . Type 2 diabetes mellitus with hyperglycemia, without long-term current use of insulin (HCC) 07/22/2017  . Elevated transaminase level 04/16/2016  . Coronary artery disease involving native coronary artery of native heart without angina pectoris 03/20/2016  . History of non-ST elevation myocardial infarction (NSTEMI) 03/20/2016  . Dyslipidemia 03/12/2016  . NSTEMI (non-ST elevated myocardial infarction) (HCC) 03/10/2016  . Constipation 02/10/2016  . Prostate cancer screening 02/10/2016  . CVA (cerebral infarction) 03/01/2015  . Right knee pain 04/06/2014  . Benign paroxysmal positional vertigo 03/10/2014  . BPH (benign prostatic hyperplasia) 11/16/2013  . Routine general medical examination at a health care facility 11/12/2013  . Morbid obesity (HCC) 03/02/2011  . B12 deficiency 09/29/2007  . ERECTILE DYSFUNCTION 07/01/2007  . History of tobacco abuse 07/01/2007  . LOW BACK PAIN, CHRONIC 07/01/2007  . HYPERTENSION, BENIGN ESSENTIAL 06/03/2007    Past Medical History:  Diagnosis Date  . CAD (coronary artery disease)    a. NSTEMI: 100% stenosis of the distal RCA --> DES placed 5/17 CLEARED BY CARDIOLOGIST  . Chronic low back pain    a. s/p back surgery.  . CVA (cerebral infarction)    a. 02/2015 dyarthria 2/2 CVA involving the lateral aspect of the precentral gyrus.  . DM2 (diabetes mellitus, type 2) (HCC)    a. pre-diabetic in the past. b. A1c 03/2016 elevated to 7.9.  . ETOH abuse   . GERD (gastroesophageal reflux disease)   . History of  echocardiogram    a. Mild LVH, EF 55-60%, Definity contrast used-LV wall motion could not be adequately assessed  . History of non-ST elevation myocardial infarction (NSTEMI) 03/2016   a. PCI: 3.5 x 24 mm Promus Premier DES to distal RCA  . History of tobacco abuse   . Hypertension    a. 02/2015 echo: 55-65%, trace TR/MR. b. 03/2016: echo with EF of 55-60%.   . Myocardial infarction (HCC)   . Obesity   . Ruptured disk   . Stroke [facility](HCC)    2016     Transfusion: None.   Consultants (if any):   Discharged Condition: Improved  Hospital Course: Daniel CharonDavid G Wisor is an 53 y.o. male who was admitted 06/03/2019 with a diagnosis of primary osteoarthritis of the left hip and went to the operating room on 06/03/2019 and underwent the above named procedures.    Surgeries: Procedure(s): TOTAL HIP ARTHROPLASTY on 06/03/2019 Patient tolerated the surgery well. Taken to PACU where she was stabilized and then transferred to the orthopedic floor.  Started on Lovenox 30mg  q 12 hrs. Foot pumps applied bilaterally at 80 mm. Heels elevated on bed with rolled towels. No evidence of DVT. Negative Homan. Physical therapy started on day #1 for gait training and transfer. OT started day #1 for ADL and assisted devices.  Patient's IV and hemovac were both removed on POD2.  Foley was removed on POD1.  The patient had a bowel movement on postop day 2.  The patient ambulated 200 feet with physical therapy  Implants: DePuy 13.5 mm small stature AML femoral stem, 54 mm OD Pinnacle 100 acetabular component, neutral Pinnacle Altrx polyethylene  insert, and a 36 mm M-SPEC +1.5 mm hip ball  He was given perioperative antibiotics:  Anti-infectives (From admission, onward)   Start     Dose/Rate Route Frequency Ordered Stop   06/03/19 1315  ceFAZolin (ANCEF) IVPB 2g/100 mL premix     2 g 200 mL/hr over 30 Minutes Intravenous Every 6 hours 06/03/19 1301 06/04/19 0631   06/03/19 0613  ceFAZolin (ANCEF) 2-4 GM/100ML-% IVPB     Note to Pharmacy: Mike CrazeHolmes, Stephen   : cabinet override      06/03/19 0613 06/03/19 1829   06/03/19 0600  ceFAZolin (ANCEF) IVPB 2g/100 mL premix  Status:  Discontinued     2 g 200 mL/hr over 30 Minutes Intravenous On call to O.R. 06/02/19 2238 06/03/19 [id number]    .  He was given sequential compression devices, early ambulation, and Lovenox for DVT prophylaxis.  He benefited maximally from the hospital stay and there were no complications.    Recent vital signs:  Vitals:   06/05/19 0009 06/05/19 0524  BP: 98/61 134/86  Pulse: 61 64  Resp: 16   Temp: 97.6 F (36.4 C)   SpO2: 92% 100%    Recent laboratory studies:  Lab Results  Component Value Date   HGB 13.4 05/21/2019   HGB 14.2 12/26/2018   HGB 15.2 08/28/2017   Lab Results  Component Value Date   WBC 6.9 05/21/2019   PLT 113 (L) 05/21/2019   Lab Results  Component Value Date   INR 0.9 05/21/2019   Lab Results  Component Value Date   NA 139 05/21/2019   K 4.5 05/21/2019   CL 106 05/21/2019   CO2 24 05/21/2019   BUN 16 05/21/2019   CREATININE 0.85 05/21/2019   GLUCOSE 116 (H) 05/21/2019    Discharge Medications:   Allergies as of 06/05/2019      Reactions   Ace Inhibitors Cough      Medication List    STOP taking these medications   meloxicam 15 MG tablet Commonly known as: MOBIC     TAKE these medications   amLODipine 5 MG tablet Commonly known as: NORVASC TAKE 1 TABLET (5 MG TOTAL) BY MOUTH DAILY. What changed:   how much to take  how to take this  when to take this  additional instructions   aspirin 81 MG tablet Take 81 mg by mouth daily.   atorvastatin 80 MG tablet Commonly known as: LIPITOR Take 1 tablet (80 mg total) by mouth daily at 6 PM. Please call and schedule an appt for further refills. 1st attempt   carvedilol 6.25 MG tablet Commonly known as: COREG TAKE 1 TABLET (6.25 MG TOTAL) BY MOUTH 2 (TWO) TIMES DAILY WITH A MEAL.   celecoxib 200 MG capsule Commonly known as:  CELEBREX Take 1 capsule (200 mg total) by mouth 2 (two) times daily.   enoxaparin 40 MG/0.4ML injection Commonly known as: LOVENOX Inject 0.4 mLs (40 mg total) into the skin daily for 14 doses.   hydrochlorothiazide 25 MG tablet Commonly known as: HYDRODIURIL Take 0.5 tablets (12.5 mg total) by mouth daily.   metFORMIN 1000 MG tablet Commonly known as: GLUCOPHAGE Take 1,000 mg by mouth 2 (two) times a day.   nitroGLYCERIN 0.4 MG SL tablet Commonly known as: NITROSTAT Place 1 tablet (0.4 mg total) under the tongue every 5 (five) minutes as needed for chest pain (CP or SOB).   oxyCODONE 5 MG immediate release tablet Commonly known as: Oxy IR/ROXICODONE Take 1 tablet (5  mg total) by mouth every 4 (four) hours as needed for moderate pain (pain score 4-6).   PROBIOTIC PO Take 1 capsule by mouth daily.   sildenafil 50 MG tablet Commonly known as: VIAGRA Take 1 tablet (50 mg total) by mouth daily as needed. Take one by mouth once daily as directed as needed 30 minutes before sexual activity. What changed:   reasons to take this  additional instructions   traMADol 50 MG tablet Commonly known as: ULTRAM Take 1-2 tablets (50-100 mg total) by mouth every 4 (four) hours as needed for moderate pain.   valsartan 80 MG tablet Commonly known as: Diovan Take 1 tablet (80 mg total) by mouth daily.   Vitamin B-12 5000 MCG Tbdp Take 5,000 mcg by mouth 2 (two) times a day.            Durable Medical Equipment  (From admission, onward)         Start     Ordered   06/03/19 1302  DME Walker rolling  Once    Question:  Patient needs a walker to treat with the following condition  Answer:  S/P total hip arthroplasty   06/03/19 1301   06/03/19 1302  DME Bedside commode  Once    Question:  Patient needs a bedside commode to treat with the following condition  Answer:  S/P total hip arthroplasty   06/03/19 1301          Diagnostic Studies: Dg Hip Port Unilat With Pelvis 1v  Left  Result Date: 06/03/2019 CLINICAL DATA:  Postop left hip arthroplasty. EXAM: DG HIP (WITH OR WITHOUT PELVIS) 1V PORT LEFT COMPARISON:  Radiograph 12/15/2018 FINDINGS: Left hip arthroplasty in expected alignment. No periprosthetic lucency or fracture. Recent postsurgical change includes air and edema in the soft tissues and overlying drain in place. Degenerative change of the right hip similar to prior radiograph. IMPRESSION: Left hip arthroplasty without immediate postoperative complication. Electronically Signed   By: Keith Rake M.D.   On: 06/03/2019 14:27    Disposition: Discharge disposition: 01-Home or Self Care     Plan for discharge home this afternoon pending progress with PT and having a BM.    Follow-up Information    Hooten, Laurice Record, MD On 07/16/2019.   Specialty: Orthopedic Surgery Why: at 9:15am Contact information: [place] [address] [phone]          Signed: Joesphine Bare 06/05/2019, 6:19 AM

## 2019-06-04 NOTE — Progress Notes (Signed)
   Subjective: 1 Day Post-Op Procedure(s) (LRB): TOTAL HIP ARTHROPLASTY (Left) Patient reports pain as mild.   Patient is well, and has had no acute complaints or problems Denies any CP, SOB, ABD pain. We will continue therapy today.  Plan is to go Home after hospital stay.  Objective: Vital signs in last 24 hours: Temp:  [97.6 F (36.4 C)-98.3 F (36.8 C)] 97.7 F (36.5 C) (07/30 0752) Pulse Rate:  [47-58] 49 (07/30 0752) Resp:  [13-23] 18 (07/30 0752) BP: (89-119)/(57-77) 115/77 (07/30 0752) SpO2:  [92 %-99 %] 97 % (07/30 0752)  Intake/Output from previous day: 07/29 0701 - 07/30 0700 In: 2263.9 [P.O.:120; I.V.:1804.3; IV Piggyback:339.6] Out: 6834 [Urine:3030; Drains:230; Blood:400] Intake/Output this shift: Total I/O In: 1279.2 [I.V.:818.7; IV Piggyback:460.5] Out: 280 [Urine:250; Drains:30]  No results for input(s): HGB in the last 72 hours. No results for input(s): WBC, RBC, HCT, PLT in the last 72 hours. No results for input(s): NA, K, CL, CO2, BUN, CREATININE, GLUCOSE, CALCIUM in the last 72 hours. No results for input(s): LABPT, INR in the last 72 hours.  EXAM General - Patient is Alert and Appropriate Extremity - Neurovascular intact Sensation intact distally Intact pulses distally Dressing - dressing C/D/I and no drainage, Hemovac intact Motor Function - intact, moving foot and toes well on exam.   Past Medical History:  Diagnosis Date  . CAD (coronary artery disease)    a. NSTEMI: 100% stenosis of the distal RCA --> DES placed 5/17 CLEARED BY CARDIOLOGIST  . Chronic low back pain    a. s/p back surgery.  . CVA (cerebral infarction)    a. 02/2015 dyarthria 2/2 CVA involving the lateral aspect of the precentral gyrus.  . DM2 (diabetes mellitus, type 2) ([facility])    a. pre-diabetic in the past. b. A1c 03/2016 elevated to 7.9.  . ETOH abuse   . GERD (gastroesophageal reflux disease)   . History of echocardiogram    a. Mild LVH, EF 55-60%, Definity contrast  used-LV wall motion could not be adequately assessed  . History of non-ST elevation myocardial infarction (NSTEMI) 03/2016   a. PCI: 3.5 x 24 mm Promus Premier DES to distal RCA  . History of tobacco abuse   . Hypertension    a. 02/2015 echo: 55-65%, trace TR/MR. b. 03/2016: echo with EF of 55-60%.   . Myocardial infarction ([facility])   . Obesity   . Ruptured disk   . Stroke ([facility])    2016    Assessment/Plan:   1 Day Post-Op Procedure(s) (LRB): TOTAL HIP ARTHROPLASTY (Left) Active Problems:   H/O total hip arthroplasty  Estimated body mass index is 39.22 kg/m as calculated from the following:   Height as of this encounter: 5\' 10"  (1.778 m).   Weight as of this encounter: 124 kg. Advance diet Up with therapy  Needs bowel movement Pain well controlled Vital signs are stable Care management to assist with discharge to home with home health  DVT Prophylaxis - Lovenox, Foot Pumps and TED hose Weight-Bearing as tolerated to left leg   T. Rachelle Hora, PA-C [place] 06/04/2019, 8:30 AM

## 2019-06-04 NOTE — Evaluation (Signed)
Occupational Therapy Evaluation Patient Details Name: Daniel [name] MRN: [id number] DOB: Mar 31, 1966 Today's Date: 06/04/2019    History of Present Illness Pt is a 53 yo male s/p elective L THA.  PMH incudes: CVA, CAD, NSTEMI, back Sx, DM, EtOH abuse, and HTN.   Clinical Impression   Pt seen for OT evaluation this date, POD#1 from above surgery. Pt was independent in all ADLs prior to surgery and eager to return to PLOF with less pain and improved safety and independence. Pt currently requires PRN minimal assist for LB dressing while in seated position due to pain and limited AROM of L hip. Pt able to recall 3/3 posterior total hip precautions at start of session and unable to verbalize how to implement during ADL and mobility. Pt instructed in posterior total hip precautions and how to implement, self care skills, falls prevention strategies, home/routines modifications, DME/AE for LB bathing and dressing tasks, compression stocking mgt strategies, and car transfer techniques. At end of session, pt able to recall 3/3 posterior total hip precautions and how to implement. Pt benefited maximally from OT eval and treat. Pt denies any additional OT needs. Pt in agreement to discharge in house. Do not anticipate need for skilled OT services upon discharge.      Follow Up Recommendations  No OT follow up    Equipment Recommendations  None recommended by OT    Recommendations for Other Services       Precautions / Restrictions Precautions Precautions: Posterior Hip Precaution Booklet Issued: Yes (comment) Restrictions Weight Bearing Restrictions: Yes LLE Weight Bearing: Weight bearing as tolerated      Mobility Bed Mobility Overal bed mobility: Needs Assistance Bed Mobility: Sit to Supine     Supine to sit: Supervision Sit to supine: Supervision   General bed mobility comments: Extra time and effort and min verbal cues for sequencing  Transfers Overall transfer level: Needs  assistance Equipment used: Rolling walker (2 wheeled) Transfers: Sit to/from Stand Sit to Stand: Min guard         General transfer comment: Mod verbal and visual cues for sequencing to maintain hip precaution compliance    Balance Overall balance assessment: Needs assistance Sitting-balance support: No upper extremity supported;Feet supported Sitting balance-Leahy Scale: Normal     Standing balance support: Bilateral upper extremity supported Standing balance-Leahy Scale: Good                             ADL either performed or assessed with clinical judgement   ADL Overall ADL's : Needs assistance/impaired                                       General ADL Comments: PRN Min A for LB ADL with AE     Vision Baseline Vision/History: Wears glasses Wears Glasses: Reading only Patient Visual Report: No change from baseline Vision Assessment?: No apparent visual deficits     Perception     Praxis      Pertinent Vitals/Pain Pain Assessment: 0-10 Pain Score: 6  Faces Pain Scale: Hurts little more Pain Location: L hip Pain Descriptors / Indicators: Aching;Sore Pain Intervention(s): Limited activity within patient's tolerance;Monitored during session;Premedicated before session;Repositioned     Hand Dominance Right   Extremity/Trunk Assessment Upper Extremity Assessment Upper Extremity Assessment: Overall WFL for tasks assessed   Lower Extremity Assessment Lower Extremity Assessment:  Defer to PT evaluation;Generalized weakness;LLE deficits/detail LLE: Unable to fully assess due to pain LLE Sensation: WNL       Communication Communication Communication: No difficulties   Cognition Arousal/Alertness: Awake/alert Behavior During Therapy: WFL for tasks assessed/performed Overall Cognitive Status: Within Functional Limits for tasks assessed                                     General Comments       Exercises Other  Exercises Other Exercises: pt instructed in posterior THPs and how to implement during ADL, IADL, functional mobility, falls prevention, pet care considerations, and car transfers; handout provided   Shoulder Instructions      Home Living Family/patient expects to be discharged to:: Private residence Living Arrangements: Spouse/significant other Available Help at Discharge: Family;Available 24 hours/day Type of Home: House Home Access: Stairs to enter Entergy CorporationEntrance Stairs-Number of Steps: 4 Entrance Stairs-Rails: Right;Left(Too wide for both) Home Layout: One level     Bathroom Shower/Tub: Chief Strategy OfficerTub/shower unit   Bathroom Toilet: Handicapped height     Home Equipment: None          Prior Functioning/Environment Level of Independence: Independent        Comments: Ind amb community distances without fall history, works Teacher, 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, no fall history, Ind with ADLs        OT Problem List: Decreased strength;Decreased range of motion      OT Treatment/Interventions:      OT Goals(Current goals can be found in the care plan section) Acute Rehab OT Goals Patient Stated Goal: To get back home and to be able to travel OT Goal Formulation: All assessment and education complete, DC therapy  OT Frequency:     Barriers to D/C:            Co-evaluation              AM-PAC OT "6 Clicks" Daily Activity     Outcome Measure Help from another person eating meals?: None Help from another person taking care of personal grooming?: None Help from another person toileting, which includes using toliet, bedpan, or urinal?: A Little Help from another person bathing (including washing, rinsing, drying)?: A Little Help from another person to put on and taking off regular upper body clothing?: None Help from another person to put on and taking off regular lower body clothing?: A Little 6 Click Score: 21   End of Session    Activity Tolerance: Patient tolerated treatment  well Patient left: in bed;with call bell/phone within reach;with bed alarm set  OT Visit Diagnosis: Other abnormalities of gait and mobility (R26.89)                Time: 1343-1410 OT Time Calculation (min): 27 min Charges:  OT General Charges $OT Visit: 1 Visit OT Evaluation $OT Eval Low Complexity: 1 Low OT Treatments $Self Care/Home Management : 8-22 mins  Richrd PrimeJamie Stiller, MPH, MS, OTR/L ascom [phone] 06/04/19, 2:32 PM

## 2019-06-04 NOTE — TOC Initial Note (Signed)
Transition of Care [facility]) - Initial/Assessment Note    Patient Details  Name: Daniel [name] MRN: [id number] Date of Birth: 11-13-1965  Transition of Care [facility]) CM/SW Contact:    Su Hilt, RN Phone Number: 06/04/2019, 11:48 AM  Clinical Narrative:                 Met with the patient to discuss DC plan and needs He lives at home with his wife, he will need a RW and 3 in 1 I notified Brad with [employer] He chose to use [facility], I notified Helene Kelp His wife provides transportation [facility] and can [facility] his meds Lovenox  Sees [facility] as PCP and is up to date   Expected Discharge Plan: [place] Barriers to Discharge: Continued Medical Work up   Patient Goals and CMS Choice Patient states their goals for this hospitalization and ongoing recovery are:: go home CMS Medicare.gov Compare Post Acute Care list provided to:: Patient Choice offered to / list presented to : Patient  Expected Discharge Plan and Services Expected Discharge Plan: [place]   Discharge Planning Services: CM Consult Post Acute Care Choice: [place] arrangements for the past 2 months: Single Family Home                 DME Arranged: 3-N-1, Walker rolling DME Agency: [employer] Date DME Agency Contacted: 06/04/19 Time DME Agency Contacted: [id number] Representative spoke with at DME Agency: [place]: PT [place]: [facility] [employer] Date [place]: 06/04/19 Time [place]: 50 Representative spoke with at [place] Arrangements/Services Living arrangements for the past 2 months: [place] with:: Spouse Patient language and need for interpreter reviewed:: No Do you feel safe going back to the place where you live?: Yes      Need for Family Participation in Patient Care: No (Comment) Care giver support system in place?: Yes (comment)    Criminal Activity/Legal Involvement Pertinent to Current Situation/Hospitalization: No - Comment as needed  Activities of Daily Living Home Assistive Devices/Equipment: CBG Meter, Blood pressure cuff, Eyeglasses ADL Screening (condition at time of admission) Patient's cognitive ability adequate to safely complete daily activities?: Yes Is the patient deaf or have difficulty hearing?: No Does the patient have difficulty seeing, even when wearing glasses/contacts?: No Does the patient have difficulty concentrating, remembering, or making decisions?: No Patient able to express need for assistance with ADLs?: Yes Does the patient have difficulty dressing or bathing?: Yes Independently performs ADLs?: Yes (appropriate for developmental age) Does the patient have difficulty walking or climbing stairs?: No Weakness of Legs: None Weakness of Arms/Hands: None  Permission Sought/Granted   Permission granted to share information with : Yes, Verbal Permission Granted              Emotional Assessment Appearance:: Appears stated age Attitude/Demeanor/Rapport: Ambitious, Engaged Affect (typically observed): Accepting, Appropriate Orientation: : Oriented to Self, Oriented to Place, Oriented to  Time, Oriented to Situation Alcohol / Substance Use: Not Applicable Psych Involvement: No (comment)  Admission diagnosis:  Primary Osteoarthritis of left hip Patient Active Problem List   Diagnosis Date Noted  . H/O total hip arthroplasty 06/03/2019  . Thrombocytopenia ([place]) 12/31/2018  . Left hip pain 12/15/2018  . Elevated ALT measurement 06/30/2018  . Type 2 diabetes mellitus with hyperglycemia, without long-term current use of insulin ([place]) 07/22/2017  .  Elevated transaminase level 04/16/2016  . Coronary artery disease involving native coronary artery of native heart without angina pectoris 03/20/2016  . History of non-ST elevation myocardial infarction (NSTEMI) 03/20/2016  . Dyslipidemia  03/12/2016  . NSTEMI (non-ST elevated myocardial infarction) ([place]) 03/10/2016  . Constipation 02/10/2016  . Prostate cancer screening 02/10/2016  . CVA (cerebral infarction) 03/01/2015  . Right knee pain 04/06/2014  . Benign paroxysmal positional vertigo 03/10/2014  . BPH (benign prostatic hyperplasia) 11/16/2013  . Routine general medical examination at a health care facility 11/12/2013  . Morbid obesity ([place]) 03/02/2011  . B12 deficiency 09/29/2007  . ERECTILE DYSFUNCTION 07/01/2007  . History of tobacco abuse 07/01/2007  . LOW BACK PAIN, CHRONIC 07/01/2007  . HYPERTENSION, BENIGN ESSENTIAL 06/03/2007   PCP:  Abner Greenspan, MD Pharmacy[facility]   [id number] [place] [id number]: [phone]: [phone]    Social Determinants of Health (SDOH) Interventions    Readmission Risk Interventions No flowsheet data found.

## 2019-06-04 NOTE — Progress Notes (Signed)
Physical Therapy Treatment Patient Details Name: Daniel [name] MRN: [id number] DOB: 11-28-1965 Today's Date: 06/04/2019    History of Present Illness Pt is a 53 yo male s/p elective L THA.  PMH incudes: CVA, CAD, NSTEMI, back Sx, DM, EtOH abuse, and HTN.    PT Comments    Pt in recliner, ready to walk again.  Stood and was able to complete full lap around unit with ease.  Improved gait speed and pattern.  No LOB or buckling.  No dizziness.  Returned to supine with use of rails.  Positioned for comfort.  Anticipate stair training and discharge home tomorrow.   Follow Up Recommendations  Home health PT     Equipment Recommendations  Rolling walker with 5" wheels;3in1 (PT);Other (comment)    Recommendations for Other Services       Precautions / Restrictions Precautions Precautions: Posterior Hip Restrictions Weight Bearing Restrictions: Yes LLE Weight Bearing: Weight bearing as tolerated    Mobility  Bed Mobility Overal bed mobility: Needs Assistance Bed Mobility: Sit to Supine       Sit to supine: Supervision   General bed mobility comments: Extra time and effort and min verbal cues for sequencing  Transfers Overall transfer level: Needs assistance Equipment used: Rolling walker (2 wheeled) Transfers: Sit to/from Stand Sit to Stand: Min guard         General transfer comment: Mod verbal and visual cues for sequencing to maintain hip precaution compliance  Ambulation/Gait Ambulation/Gait assistance: Min guard Gait Distance (Feet): 200 Feet Assistive device: Rolling walker (2 wheeled) Gait Pattern/deviations: Step-to pattern;Step-through pattern Gait velocity: decreased   General Gait Details: more reciprocal gait pattern this session with increased speed.   Stairs             Wheelchair Mobility    Modified Rankin (Stroke Patients Only)       Balance Overall balance assessment: Needs assistance Sitting-balance support: No upper extremity  supported;Feet supported Sitting balance-Leahy Scale: Normal     Standing balance support: Bilateral upper extremity supported Standing balance-Leahy Scale: Good                              Cognition Arousal/Alertness: Awake/alert Behavior During Therapy: WFL for tasks assessed/performed Overall Cognitive Status: Within Functional Limits for tasks assessed                                        Exercises Total Joint Exercises Ankle Circles/Pumps: AROM;Both;5 reps;10 reps;Supine Quad Sets: Strengthening;Both;10 reps;Supine Gluteal Sets: Strengthening;Both;10 reps;Supine Heel Slides: AROM;Left;10 reps;Supine Hip ABduction/ADduction: AROM;10 reps;Left;Supine Long Arc Quad: Strengthening;10 reps;Left;Seated Knee Flexion: Strengthening;10 reps;Left;Seated    General Comments        Pertinent Vitals/Pain Pain Assessment: 0-10 Pain Score: 3  Faces Pain Scale: Hurts little more Pain Location: L hip Pain Descriptors / Indicators: Aching;Sore Pain Intervention(s): Limited activity within patient's tolerance;Monitored during session;Repositioned    Home Living                      Prior Function            PT Goals (current goals can now be found in the care plan section) Progress towards PT goals: Progressing toward goals    Frequency    BID      PT Plan Current plan remains appropriate  Co-evaluation              AM-PAC PT "6 Clicks" Mobility   Outcome Measure  Help needed turning from your back to your side while in a flat bed without using bedrails?: A Little Help needed moving from lying on your back to sitting on the side of a flat bed without using bedrails?: A Little Help needed moving to and from a bed to a chair (including a wheelchair)?: A Little Help needed standing up from a chair using your arms (e.g., wheelchair or bedside chair)?: A Little Help needed to walk in hospital room?: A Little Help needed  climbing 3-5 steps with a railing? : A Little 6 Click Score: 18    End of Session Equipment Utilized During Treatment: Gait belt Activity Tolerance: Patient tolerated treatment well Patient left: in bed;with call bell/phone within reach;with bed alarm set         Time: 1159-1213 PT 1610-9604Time Calculation (min) (ACUTE ONLY): 14 min  Charges:  $Gait Training: 8-22 mins                    Danielle DessSarah , PTA 06/04/19, 1:26 PM

## 2019-06-04 NOTE — Anesthesia Postprocedure Evaluation (Signed)
Anesthesia Post Note  Patient: Daniel [name]  Procedure(s) Performed: TOTAL HIP ARTHROPLASTY (Left )  Patient location during evaluation: Nursing Unit Anesthesia Type: Spinal Level of consciousness: awake, awake and alert, oriented and patient cooperative Pain management: pain level controlled Vital Signs Assessment: post-procedure vital signs reviewed and stable Respiratory status: spontaneous breathing, nonlabored ventilation and respiratory function stable Cardiovascular status: stable Postop Assessment: no headache, no backache, no apparent nausea or vomiting, patient able to bend at knees, adequate PO intake and able to ambulate Anesthetic complications: no     Last Vitals:  Vitals:   06/03/19 2253 06/04/19 0336  BP: 111/67 119/67  Pulse: (!) 52 (!) 47  Resp: 18 19  Temp: 36.7 C 36.7 C  SpO2: 95% 96%    Last Pain:  Vitals:   06/04/19 0441  TempSrc:   PainSc: Asleep                 ,  [facility]

## 2019-06-04 NOTE — Progress Notes (Signed)
Physical Therapy Treatment Patient Details Name: Daniel CharonDavid G [name] MRN: [id number] DOB: Mar 14, 1966 Today's Date: 06/04/2019    History of Present Illness Pt is a 53 yo male s/p elective L THA.  PMH incudes: CVA, CAD, NSTEMI, back Sx, DM, EtOH abuse, and HTN.    PT Comments    Participated in exercises as described below.  Stood and was able to progress ambulation to 120' with walker and slow but steady gait.  No LOB or buckling.  Some dizziness noted upon sitting but relieved with time.  Progressing well towards goals.   Follow Up Recommendations  Home health PT     Equipment Recommendations  Rolling walker with 5" wheels;3in1 (PT);Other (comment)    Recommendations for Other Services       Precautions / Restrictions Precautions Precautions: Posterior Hip Restrictions Weight Bearing Restrictions: Yes LLE Weight Bearing: Weight bearing as tolerated    Mobility  Bed Mobility Overal bed mobility: Needs Assistance Bed Mobility: Supine to Sit           General bed mobility comments: Extra time and effort and min verbal cues for sequencing  Transfers   Equipment used: Rolling walker (2 wheeled) Transfers: Sit to/from Stand Sit to Stand: Min guard         General transfer comment: Mod verbal and visual cues for sequencing to maintain hip precaution compliance  Ambulation/Gait Ambulation/Gait assistance: Min guard Gait Distance (Feet): 120 Feet Assistive device: Rolling walker (2 wheeled) Gait Pattern/deviations: Step-to pattern;Antalgic Gait velocity: decreased   General Gait Details: Slow, antalgic step-to gait pattern with cues for general sequencing but steady without LOB   Stairs             Wheelchair Mobility    Modified Rankin (Stroke Patients Only)       Balance Overall balance assessment: Needs assistance Sitting-balance support: No upper extremity supported;Feet supported Sitting balance-Leahy Scale: Normal     Standing balance  support: Bilateral upper extremity supported Standing balance-Leahy Scale: Good                              Cognition Arousal/Alertness: Awake/alert Behavior During Therapy: WFL for tasks assessed/performed Overall Cognitive Status: Within Functional Limits for tasks assessed                                        Exercises Total Joint Exercises Ankle Circles/Pumps: AROM;Both;5 reps;10 reps;Supine Quad Sets: Strengthening;Both;10 reps;Supine Gluteal Sets: Strengthening;Both;10 reps;Supine Heel Slides: AROM;Left;10 reps;Supine Hip ABduction/ADduction: AROM;10 reps;Left;Supine Long Arc Quad: Strengthening;10 reps;Left;Seated Knee Flexion: Strengthening;10 reps;Left;Seated    General Comments        Pertinent Vitals/Pain Pain Assessment: Faces Faces Pain Scale: Hurts little more Pain Location: L hip Pain Descriptors / Indicators: Aching;Sore Pain Intervention(s): Limited activity within patient's tolerance;Monitored during session    Home Living                      Prior Function            PT Goals (current goals can now be found in the care plan section) Progress towards PT goals: Progressing toward goals    Frequency    BID      PT Plan Current plan remains appropriate    Co-evaluation              AM-PAC  PT "6 Clicks" Mobility   Outcome Measure  Help needed turning from your back to your side while in a flat bed without using bedrails?: A Little Help needed moving from lying on your back to sitting on the side of a flat bed without using bedrails?: A Little Help needed moving to and from a bed to a chair (including a wheelchair)?: A Little Help needed standing up from a chair using your arms (e.g., wheelchair or bedside chair)?: A Little Help needed to walk in hospital room?: A Little Help needed climbing 3-5 steps with a railing? : A Little 6 Click Score: 18    End of Session Equipment Utilized During  Treatment: Gait belt Activity Tolerance: Patient tolerated treatment well Patient left: in chair;with chair alarm set;Other (comment);with call bell/phone within reach         Time: 0859-0916 PT Time Calculation (min) (ACUTE ONLY): 17 min  Charges:  $Gait Training: 8-22 mins                     Chesley Noon, PTA 06/04/19, 10:34 AM

## 2019-06-05 LAB — SURGICAL PATHOLOGY

## 2019-06-05 LAB — GLUCOSE, CAPILLARY: Glucose-Capillary: 106 mg/dL — ABNORMAL HIGH (ref 70–99)

## 2019-06-05 MED ORDER — OXYCODONE HCL 5 MG PO TABS: 5.0000 mg | ORAL_TABLET | ORAL | 0 refills | Status: DC | PRN

## 2019-06-05 MED ORDER — TRAMADOL HCL 50 MG PO TABS: 50.0000 mg | ORAL_TABLET | ORAL | 1 refills | Status: DC | PRN

## 2019-06-05 MED ORDER — CELECOXIB 200 MG PO CAPS: 200.0000 mg | ORAL_CAPSULE | Freq: Two times a day (BID) | ORAL | 1 refills | Status: DC

## 2019-06-05 MED ORDER — ENOXAPARIN SODIUM 40 MG/0.4ML ~~LOC~~ SOLN: 40.0000 mg | SUBCUTANEOUS | 0 refills | Status: DC

## 2019-06-05 NOTE — Progress Notes (Signed)
   Subjective: 2 Days Post-Op Procedure(s) (LRB): TOTAL HIP ARTHROPLASTY (Left) Patient reports pain as mild.   Patient is well, and has had no acute complaints or problems Denies any CP, SOB, ABD pain. We will continue therapy today.  Plan is to go Home after hospital stay.  Objective: Vital signs in last 24 hours: Temp:  [97.6 F (36.4 C)-97.7 F (36.5 C)] 97.6 F (36.4 C) (07/31 0009) Pulse Rate:  [49-64] 64 (07/31 0524) Resp:  [16-18] 16 (07/31 0009) BP: (98-134)/(59-86) 134/86 (07/31 0524) SpO2:  [92 %-100 %] 100 % (07/31 0524)  Intake/Output from previous day: 07/30 0701 - 07/31 0700 In: 2170.6 [P.O.:720; I.V.:990.2; IV Piggyback:460.5] Out: 370 [Urine:250; Drains:120] Intake/Output this shift: No intake/output data recorded.  No results for input(s): HGB in the last 72 hours. No results for input(s): WBC, RBC, HCT, PLT in the last 72 hours. No results for input(s): NA, K, CL, CO2, BUN, CREATININE, GLUCOSE, CALCIUM in the last 72 hours. No results for input(s): LABPT, INR in the last 72 hours.  EXAM General - Patient is Alert and Appropriate Extremity - Neurovascular intact Sensation intact distally Intact pulses distally Dressing - dressing C/D/I and no drainage, Hemovac removed with no complication.  The Hemovac tubing appeared to be intact on removal. Motor Function - intact, moving foot and toes well on exam.  Ambulated 200 feet with physical therapy  Past Medical History:  Diagnosis Date  . CAD (coronary artery disease)    a. NSTEMI: 100% stenosis of the distal RCA --> DES placed 5/17 CLEARED BY CARDIOLOGIST  . Chronic low back pain    a. s/p back surgery.  . CVA (cerebral infarction)    a. 02/2015 dyarthria 2/2 CVA involving the lateral aspect of the precentral gyrus.  . DM2 (diabetes mellitus, type 2) ([place])    a. pre-diabetic in the past. b. A1c 03/2016 elevated to 7.9.  . ETOH abuse   . GERD (gastroesophageal reflux disease)   . History of  echocardiogram    a. Mild LVH, EF 55-60%, Definity contrast used-LV wall motion could not be adequately assessed  . History of non-ST elevation myocardial infarction (NSTEMI) 03/2016   a. PCI: 3.5 x 24 mm Promus Premier DES to distal RCA  . History of tobacco abuse   . Hypertension    a. 02/2015 echo: 55-65%, trace TR/MR. b. 03/2016: echo with EF of 55-60%.   . Myocardial infarction ([place])   . Obesity   . Ruptured disk   . Stroke ([place])    2016    Assessment/Plan:   2 Days Post-Op Procedure(s) (LRB): TOTAL HIP ARTHROPLASTY (Left) Active Problems:   H/O total hip arthroplasty  Estimated body mass index is 39.22 kg/m as calculated from the following:   Height as of this encounter: 5\' 10"  (1.778 m).   Weight as of this encounter: 124 kg. Advance diet Up with therapy  Bowel movement already this morning. Pain well controlled Vital signs are stable Care management to assist with discharge to home with home health today  DVT Prophylaxis - Lovenox, Foot Pumps and TED hose Weight-Bearing as tolerated to left leg   Reche Dixon, PA-C [place] 06/05/2019, 6:15 AM

## 2019-06-05 NOTE — Progress Notes (Addendum)
Pt in no acute distress. VSS. Educated on discharge materials and verbalized understanding.

## 2019-06-05 NOTE — Progress Notes (Addendum)
Physical Therapy Treatment Patient Details Name: Daniel [name] MRN: [id number] DOB: Jan 16, 1966 Today's Date: 06/05/2019    History of Present Illness Pt is a 53 yo male s/p elective L THA.  PMH incudes: CVA, CAD, NSTEMI, back Sx, DM, EtOH abuse, and HTN.    PT Comments    Pt presents with min deficits in strength, transfers, mobility, gait, balance, and activity tolerance but continues to make good progress towards goals.  Pt was Mod Ind with bed mobility tasks with good speed and control and hip precaution compliance.  Pt was SBA with transfers with min verbal cues for general sequencing for hip precaution compliance.  Pt was able to amb 150' with a RW and SBA with improving cadence and LLE stance time with good stability.  Pt was able to ascend/descend 4 steps with one rail demonstrating good eccentric and concentric control as well as proper sequencing.  Pt will benefit from HHPT services upon discharge to safely address above deficits for decreased caregiver assistance and eventual return to PLOF.       Follow Up Recommendations  Home health PT     Equipment Recommendations  Rolling walker with 5" wheels;3in1 (PT)    Recommendations for Other Services       Precautions / Restrictions Precautions Precautions: Posterior Hip Precaution Booklet Issued: Yes (comment) Restrictions Weight Bearing Restrictions: Yes LLE Weight Bearing: Weight bearing as tolerated    Mobility  Bed Mobility Overal bed mobility: Modified Independent Bed Mobility: Supine to Sit     Supine to sit: Modified independent (Device/Increase time)     General bed mobility comments: Improved speed and control with bed mobiltiy tasks  Transfers Overall transfer level: Needs assistance Equipment used: Rolling walker (2 wheeled) Transfers: Sit to/from Stand Sit to Stand: Supervision         General transfer comment: Min verbal and visual cues for sequencing to maintain hip precaution  compliance  Ambulation/Gait Ambulation/Gait assistance: Supervision Gait Distance (Feet): 150 Feet Assistive device: Rolling walker (2 wheeled) Gait Pattern/deviations: Step-through pattern;Decreased step length - right;Decreased step length - left;Antalgic Gait velocity: decreased   General Gait Details: Improved cadence and LLE stance time during amb   Stairs Stairs: Yes Stairs assistance: Min guard Stair Management: One rail Left Number of Stairs: 4 General stair comments: Good eccentric and concentric control during stair training with min verbal and visual cues for sequencing   Wheelchair Mobility    Modified Rankin (Stroke Patients Only)       Balance Overall balance assessment: Needs assistance Sitting-balance support: No upper extremity supported;Feet supported Sitting balance-Leahy Scale: Normal     Standing balance support: Bilateral upper extremity supported Standing balance-Leahy Scale: Good                              Cognition Arousal/Alertness: Awake/alert Behavior During Therapy: WFL for tasks assessed/performed Overall Cognitive Status: Within Functional Limits for tasks assessed                                        Exercises Total Joint Exercises Ankle Circles/Pumps: AROM;Both;10 reps Quad Sets: Both;10 reps;Strengthening Gluteal Sets: Strengthening;Both;10 reps Long Arc Quad: Strengthening;10 reps;Left;Seated;15 reps Knee Flexion: Strengthening;10 reps;Left;Seated;15 reps Marching in Standing: AROM;Both;10 reps;Standing Other Exercises Other Exercises: HEP education/review per handout Other Exercises: Hip precaution education and review with practice compliance during functional  tasks; pt recalled 2/3 hip precautions and 3/3 with min verbal cue Other Exercises: 90 deg left turn sequencing education and practice to avoid CKC L hip IR  Car transfer training verbal and visual education for proper sequencing for hip  precaution compliance.    General Comments        Pertinent Vitals/Pain Pain Assessment: 0-10 Pain Score: 5  Pain Location: L hip Pain Descriptors / Indicators: Aching;Sore Pain Intervention(s): Premedicated before session;Monitored during session    Home Living                      Prior Function            PT Goals (current goals can now be found in the care plan section) Progress towards PT goals: Progressing toward goals    Frequency    BID      PT Plan Current plan remains appropriate    Co-evaluation              AM-PAC PT "6 Clicks" Mobility   Outcome Measure  Help needed turning from your back to your side while in a flat bed without using bedrails?: None Help needed moving from lying on your back to sitting on the side of a flat bed without using bedrails?: None Help needed moving to and from a bed to a chair (including a wheelchair)?: A Little Help needed standing up from a chair using your arms (e.g., wheelchair or bedside chair)?: A Little Help needed to walk in hospital room?: A Little Help needed climbing 3-5 steps with a railing? : A Little 6 Click Score: 20    End of Session Equipment Utilized During Treatment: Gait belt Activity Tolerance: Patient tolerated treatment well Patient left: in chair;with call bell/phone within reach;with chair alarm set Nurse Communication: Mobility status PT Visit Diagnosis: Muscle weakness (generalized) (M62.81);Other abnormalities of gait and mobility (R26.89)     Time: 1010-1036 PT Time Calculation (min) (ACUTE ONLY): 26 min  Charges:  $Gait Training: 8-22 mins $Therapeutic Exercise: 8-22 mins                     D. Scott  PT, DPT 06/05/19, 12:07 PM

## 2019-06-06 ENCOUNTER — Other Ambulatory Visit: Payer: Self-pay | Admitting: Family Medicine

## 2019-06-08 NOTE — Telephone Encounter (Signed)
Not on med list, it says the hospital doc d/c med on 06/05/19, please advise

## 2019-06-08 NOTE — Telephone Encounter (Signed)
He just had hip surgery so I don't think he should be on it Thanks

## 2019-07-08 ENCOUNTER — Other Ambulatory Visit: Payer: Self-pay | Admitting: *Deleted

## 2019-07-08 MED ORDER — METFORMIN HCL 1000 MG PO TABS: 1000.0000 mg | ORAL_TABLET | Freq: Two times a day (BID) | ORAL | 1 refills | Status: DC

## 2019-09-17 ENCOUNTER — Other Ambulatory Visit: Payer: Self-pay

## 2019-09-17 DIAGNOSIS — Z20822 Contact with and (suspected) exposure to covid-19: Secondary | ICD-10-CM

## 2019-09-19 LAB — NOVEL CORONAVIRUS, NAA: SARS-CoV-2, NAA: NOT DETECTED

## 2019-09-21 ENCOUNTER — Telehealth: Payer: Self-pay | Admitting: *Deleted

## 2019-09-21 NOTE — Telephone Encounter (Signed)
Left VM requesting pt to call the office back, to let him know covid test negative and to see how he's feeling

## 2019-10-19 ENCOUNTER — Other Ambulatory Visit: Payer: Self-pay | Admitting: Cardiovascular Disease

## 2019-11-10 ENCOUNTER — Other Ambulatory Visit: Payer: Self-pay | Admitting: Cardiovascular Disease

## 2019-12-09 ENCOUNTER — Other Ambulatory Visit: Payer: Self-pay | Admitting: Cardiology

## 2020-01-08 ENCOUNTER — Telehealth: Payer: Self-pay | Admitting: Family Medicine

## 2020-01-08 NOTE — Telephone Encounter (Signed)
Patient scheduled labs on 01/22/20 and cpx on 01/28/20.

## 2020-01-08 NOTE — Telephone Encounter (Signed)
Pt needs a CPE, last CPE was 12/31/18 and no recent DM labs, Daniel [name] please schedule appt when able, I refilled DM med Metformin once

## 2020-01-11 ENCOUNTER — Other Ambulatory Visit: Payer: Self-pay | Admitting: Family Medicine

## 2020-01-12 ENCOUNTER — Other Ambulatory Visit: Payer: Self-pay | Admitting: Family Medicine

## 2020-01-21 ENCOUNTER — Telehealth: Payer: Self-pay | Admitting: Family Medicine

## 2020-01-21 DIAGNOSIS — E538 Deficiency of other specified B group vitamins: Secondary | ICD-10-CM

## 2020-01-21 DIAGNOSIS — Z125 Encounter for screening for malignant neoplasm of prostate: Secondary | ICD-10-CM

## 2020-01-21 DIAGNOSIS — D696 Thrombocytopenia, unspecified: Secondary | ICD-10-CM

## 2020-01-21 DIAGNOSIS — N400 Enlarged prostate without lower urinary tract symptoms: Secondary | ICD-10-CM

## 2020-01-21 DIAGNOSIS — I10 Essential (primary) hypertension: Secondary | ICD-10-CM

## 2020-01-21 DIAGNOSIS — Z0000 Encounter for general adult medical examination without abnormal findings: Secondary | ICD-10-CM

## 2020-01-21 DIAGNOSIS — E1165 Type 2 diabetes mellitus with hyperglycemia: Secondary | ICD-10-CM

## 2020-01-21 DIAGNOSIS — E1169 Type 2 diabetes mellitus with other specified complication: Secondary | ICD-10-CM

## 2020-01-21 NOTE — Telephone Encounter (Signed)
-----   Message from Aquilla Solian, RT sent at 01/13/2020 10:15 AM EST ----- Regarding: Lab Orders for Friday 3.19.2021 Please place lab orders for Friday 3.19.2021, office visit for physical on Thursday 3.25.2021 Thank you, Jones Bales RT(R)

## 2020-01-22 ENCOUNTER — Other Ambulatory Visit: Payer: Self-pay

## 2020-01-22 ENCOUNTER — Other Ambulatory Visit (INDEPENDENT_AMBULATORY_CARE_PROVIDER_SITE_OTHER): Payer: BC Managed Care – PPO

## 2020-01-22 DIAGNOSIS — Z125 Encounter for screening for malignant neoplasm of prostate: Secondary | ICD-10-CM | POA: Diagnosis not present

## 2020-01-22 DIAGNOSIS — E1165 Type 2 diabetes mellitus with hyperglycemia: Secondary | ICD-10-CM | POA: Diagnosis not present

## 2020-01-22 DIAGNOSIS — E785 Hyperlipidemia, unspecified: Secondary | ICD-10-CM | POA: Diagnosis not present

## 2020-01-22 DIAGNOSIS — E1169 Type 2 diabetes mellitus with other specified complication: Secondary | ICD-10-CM

## 2020-01-22 DIAGNOSIS — N400 Enlarged prostate without lower urinary tract symptoms: Secondary | ICD-10-CM | POA: Diagnosis not present

## 2020-01-22 DIAGNOSIS — D696 Thrombocytopenia, unspecified: Secondary | ICD-10-CM | POA: Diagnosis not present

## 2020-01-22 DIAGNOSIS — I10 Essential (primary) hypertension: Secondary | ICD-10-CM | POA: Diagnosis not present

## 2020-01-22 DIAGNOSIS — E538 Deficiency of other specified B group vitamins: Secondary | ICD-10-CM | POA: Diagnosis not present

## 2020-01-22 LAB — CBC WITH DIFFERENTIAL/PLATELET
Basophils Absolute: 0.1 10*3/uL (ref 0.0–0.1)
Basophils Relative: 1.3 % (ref 0.0–3.0)
Eosinophils Absolute: 0.5 10*3/uL (ref 0.0–0.7)
Eosinophils Relative: 9.3 % — ABNORMAL HIGH (ref 0.0–5.0)
HCT: 43.6 % (ref 39.0–52.0)
Hemoglobin: 14.9 g/dL (ref 13.0–17.0)
Lymphocytes Relative: 27.1 % (ref 12.0–46.0)
Lymphs Abs: 1.5 10*3/uL (ref 0.7–4.0)
MCHC: 34.2 g/dL (ref 30.0–36.0)
MCV: 94.7 fl (ref 78.0–100.0)
Monocytes Absolute: 0.6 10*3/uL (ref 0.1–1.0)
Monocytes Relative: 10.4 % (ref 3.0–12.0)
Neutro Abs: 2.9 10*3/uL (ref 1.4–7.7)
Neutrophils Relative %: 51.9 % (ref 43.0–77.0)
Platelets: 119 10*3/uL — ABNORMAL LOW (ref 150.0–400.0)
RBC: 4.61 Mil/uL (ref 4.22–5.81)
RDW: 13 % (ref 11.5–15.5)
WBC: 5.6 10*3/uL (ref 4.0–10.5)

## 2020-01-22 LAB — COMPREHENSIVE METABOLIC PANEL
ALT: 53 U/L (ref 0–53)
AST: 34 U/L (ref 0–37)
Albumin: 4.4 g/dL (ref 3.5–5.2)
Alkaline Phosphatase: 111 U/L (ref 39–117)
BUN: 23 mg/dL (ref 6–23)
CO2: 29 mEq/L (ref 19–32)
Calcium: 9.4 mg/dL (ref 8.4–10.5)
Chloride: 103 mEq/L (ref 96–112)
Creatinine, Ser: 0.88 mg/dL (ref 0.40–1.50)
GFR: 89.62 mL/min (ref >60.00)
Glucose, Bld: 141 mg/dL — ABNORMAL HIGH (ref 70–99)
Potassium: 4.7 mEq/L (ref 3.5–5.1)
Sodium: 138 mEq/L (ref 135–145)
Total Bilirubin: 0.5 mg/dL (ref 0.2–1.2)
Total Protein: 7 g/dL (ref 6.0–8.3)

## 2020-01-22 LAB — LIPID PANEL
Cholesterol: 66 mg/dL (ref 0–200)
HDL: 26 mg/dL — ABNORMAL LOW (ref >39.00)
LDL Cholesterol: 31 mg/dL (ref 0–99)
NonHDL: 40.25
Total CHOL/HDL Ratio: 3
Triglycerides: 48 mg/dL (ref 0.0–149.0)
VLDL: 9.6 mg/dL (ref 0.0–40.0)

## 2020-01-22 LAB — VITAMIN B12: Vitamin B-12: 301 pg/mL (ref 211–911)

## 2020-01-22 LAB — PSA: PSA: 0.14 ng/mL (ref 0.10–4.00)

## 2020-01-22 LAB — HEMOGLOBIN A1C: Hgb A1c MFr Bld: 6.5 % (ref 4.6–6.5)

## 2020-01-22 LAB — TSH: TSH: 2.09 u[IU]/mL (ref 0.35–4.50)

## 2020-01-28 ENCOUNTER — Encounter: Payer: BC Managed Care – PPO | Admitting: Family Medicine

## 2020-02-07 ENCOUNTER — Other Ambulatory Visit: Payer: Self-pay | Admitting: Family Medicine

## 2020-03-08 ENCOUNTER — Other Ambulatory Visit: Payer: Self-pay | Admitting: Family Medicine

## 2020-03-08 ENCOUNTER — Other Ambulatory Visit: Payer: Self-pay | Admitting: Cardiology

## 2020-03-08 NOTE — Telephone Encounter (Signed)
Meds refilled once  

## 2020-03-08 NOTE — Telephone Encounter (Signed)
Pt cancelled his CPE in March, please call and get CPE re-scheduled, once appt on the books I can refill med, Thanks  (no labs needed had labs done in March)

## 2020-03-08 NOTE — Telephone Encounter (Signed)
I spoke to patient's wife,MIchelle.  Patient's having surgery the end of the month.  She said when he gets home from work, she'll discuss it with him and call back tomorrow to schedule cpx.

## 2020-03-08 NOTE — Telephone Encounter (Signed)
Pt called and scheduled cpe for 03/11/20.

## 2020-03-11 ENCOUNTER — Encounter: Payer: Self-pay | Admitting: Family Medicine

## 2020-03-11 ENCOUNTER — Other Ambulatory Visit: Payer: Self-pay

## 2020-03-11 ENCOUNTER — Ambulatory Visit (INDEPENDENT_AMBULATORY_CARE_PROVIDER_SITE_OTHER): Payer: BC Managed Care – PPO | Admitting: Family Medicine

## 2020-03-11 VITALS — BP 136/86 | HR 70 | Temp 98.0°F | Ht 69.0 in | Wt 273.3 lb

## 2020-03-11 DIAGNOSIS — N400 Enlarged prostate without lower urinary tract symptoms: Secondary | ICD-10-CM

## 2020-03-11 DIAGNOSIS — Z125 Encounter for screening for malignant neoplasm of prostate: Secondary | ICD-10-CM

## 2020-03-11 DIAGNOSIS — E785 Hyperlipidemia, unspecified: Secondary | ICD-10-CM

## 2020-03-11 DIAGNOSIS — E1165 Type 2 diabetes mellitus with hyperglycemia: Secondary | ICD-10-CM

## 2020-03-11 DIAGNOSIS — Z0000 Encounter for general adult medical examination without abnormal findings: Secondary | ICD-10-CM

## 2020-03-11 DIAGNOSIS — E1169 Type 2 diabetes mellitus with other specified complication: Secondary | ICD-10-CM

## 2020-03-11 DIAGNOSIS — I10 Essential (primary) hypertension: Secondary | ICD-10-CM | POA: Diagnosis not present

## 2020-03-11 DIAGNOSIS — D696 Thrombocytopenia, unspecified: Secondary | ICD-10-CM | POA: Diagnosis not present

## 2020-03-11 DIAGNOSIS — E538 Deficiency of other specified B group vitamins: Secondary | ICD-10-CM

## 2020-03-11 DIAGNOSIS — I25119 Atherosclerotic heart disease of native coronary artery with unspecified angina pectoris: Secondary | ICD-10-CM

## 2020-03-11 MED ORDER — AMLODIPINE BESYLATE 5 MG PO TABS: 5.0000 mg | ORAL_TABLET | Freq: Every day | ORAL | 11 refills | Status: DC

## 2020-03-11 MED ORDER — VALSARTAN 80 MG PO TABS: 80.0000 mg | ORAL_TABLET | Freq: Every day | ORAL | 11 refills | Status: DC

## 2020-03-11 MED ORDER — HYDROCHLOROTHIAZIDE 25 MG PO TABS: 12.5000 mg | ORAL_TABLET | Freq: Every day | ORAL | 11 refills | Status: DC

## 2020-03-11 MED ORDER — METFORMIN HCL 1000 MG PO TABS: 1000.0000 mg | ORAL_TABLET | Freq: Two times a day (BID) | ORAL | 11 refills | Status: DC

## 2020-03-11 NOTE — Assessment & Plan Note (Signed)
.   Lab Results  Component Value Date   HGBA1C 6.5 01/22/2020   Great improvement with better diet  Wt is stable Plans on exercise after hip is replaced Continue metformin  F/u 6 mo  Pt is unable to schedule eye exam until hip surgery is done/over  Will plan a pna vaccine at next PE

## 2020-03-11 NOTE — Progress Notes (Signed)
Subjective:    Patient ID: Daniel [name], male    DOB: [birth year], 54 y.o.   MRN: [id number]   This visit occurred during the SARS-CoV-2 public health emergency.  Safety protocols were in place, including screening questions prior to the visit, additional usage of staff PPE, and extensive cleaning of exam room while observing appropriate contact time as indicated for disinfecting solutions.    HPI Here for health maintenance exam and to review chronic medical problems    Wt Readings from Last 3 Encounters:  03/11/20 273 lb 5 oz (124 kg)  06/03/19 273 lb 5.9 oz (124 kg)  05/21/19 273 lb (123.8 kg)   40.36 kg/m  Has not been able to get on treadmill due to hip  Planning hip replacement soon -looking forward to exercise in the future   covid status  Has not had the vaccine-is thinking about it    Eye exam 10/19 -due for it  Having his hip replaced soon - will get eye exam later  He is ready   Colon cancer screening -declines colonoscopy  Wants to do ifob kit   Flu shot -had it in the fall  Tdap 1/14 Pneumovax 1/15  Zoster status - unsure if covered  Prostate health  Lab Results  Component Value Date   PSA 0.14 01/22/2020   PSA 0.26 12/26/2018   PSA 0.27 07/12/2017  no prostate problems at all  No nocturia     HTN in setting of CAD and past MI bp is stable today  No cp or palpitations or headaches or edema  No side effects to medicines  BP Readings from Last 3 Encounters:  03/11/20 136/86  06/05/19 (!) 95/55  05/21/19 139/79      DM2 Lab Results  Component Value Date   HGBA1C 6.5 01/22/2020  this is down from 8.3 in June! Was starting to eat much lower carb -has stuck with it  He feels about the same     Hyperlipidemia Lab Results  Component Value Date   CHOL 66 01/22/2020   CHOL 81 12/26/2018   CHOL 84 06/27/2018   Lab Results  Component Value Date   HDL 26.00 (L) 01/22/2020   HDL 34.90 (L) 12/26/2018   HDL 34.50 (L) 06/27/2018   Lab  Results  Component Value Date   LDLCALC 31 01/22/2020   LDLCALC 32 12/26/2018   LDLCALC 38 06/27/2018   Lab Results  Component Value Date   TRIG 48.0 01/22/2020   TRIG 66.0 12/26/2018   TRIG 55.0 06/27/2018   Lab Results  Component Value Date   CHOLHDL 3 01/22/2020   CHOLHDL 2 12/26/2018   CHOLHDL 2 06/27/2018   No results found for: LDLDIRECT  Atorvastatin 80 mg  HDL is low- cannot exercise right now   Has cardiology later this month All is stable    H/o low platelets Lab Results  Component Value Date   WBC 5.6 01/22/2020   HGB 14.9 01/22/2020   HCT 43.6 01/22/2020   MCV 94.7 01/22/2020   PLT 119.0 (L) 01/22/2020  last time 128   Lab Results  Component Value Date   VITAMINB12 301 01/22/2020   Patient Active Problem List   Diagnosis Date Noted  . H/O total hip arthroplasty 06/03/2019  . Thrombocytopenia ([place]) 12/31/2018  . Left hip pain 12/15/2018  . Type 2 diabetes mellitus with hyperglycemia, without long-term current use of insulin ([place]) 07/22/2017  . Coronary artery disease involving native coronary artery of native heart  without angina pectoris 03/20/2016  . History of non-ST elevation myocardial infarction (NSTEMI) 03/20/2016  . Hyperlipidemia associated with type 2 diabetes mellitus ([place]) 03/12/2016  . NSTEMI (non-ST elevated myocardial infarction) ([place]) 03/10/2016  . Prostate cancer screening 02/10/2016  . H/O: CVA (cerebrovascular accident) 03/01/2015  . Right knee pain 04/06/2014  . Benign paroxysmal positional vertigo 03/10/2014  . BPH (benign prostatic hyperplasia) 11/16/2013  . Routine general medical examination at a health care facility 11/12/2013  . Morbid obesity ([place]) 03/02/2011  . B12 deficiency 09/29/2007  . ERECTILE DYSFUNCTION 07/01/2007  . History of tobacco abuse 07/01/2007  . LOW BACK PAIN, CHRONIC 07/01/2007  . HYPERTENSION, BENIGN ESSENTIAL 06/03/2007   Past Medical History:  Diagnosis Date  . CAD (coronary artery disease)      a. NSTEMI: 100% stenosis of the distal RCA --> DES placed 5/17 CLEARED BY CARDIOLOGIST  . Chronic low back pain    a. s/p back surgery.  . CVA (cerebral infarction)    a. 02/2015 dyarthria 2/2 CVA involving the lateral aspect of the precentral gyrus.  . DM2 (diabetes mellitus, type 2) ([place])    a. pre-diabetic in the past. b. A1c 03/2016 elevated to 7.9.  . ETOH abuse   . GERD (gastroesophageal reflux disease)   . History of echocardiogram    a. Mild LVH, EF 55-60%, Definity contrast used-LV wall motion could not be adequately assessed  . History of non-ST elevation myocardial infarction (NSTEMI) 03/2016   a. PCI: 3.5 x 24 mm Promus Premier DES to distal RCA  . History of tobacco abuse   . Hypertension    a. 02/2015 echo: 55-65%, trace TR/MR. b. 03/2016: echo with EF of 55-60%.   . Myocardial infarction ([place])   . Obesity   . Ruptured disk   . Stroke [facility])    2016   Past Surgical History:  Procedure Laterality Date  . BACK SURGERY     ruptured disk, L-S  . CARDIAC CATHETERIZATION  05/2004   minimal CAD  . CARDIAC CATHETERIZATION N/A 03/10/2016   Procedure: Left Heart Cath and Coronary Angiography;  Surgeon: Burnell Blanks, MD;  Location: [facility];  Service: Cardiovascular;  Laterality: N/A;  . CARDIAC CATHETERIZATION N/A 03/10/2016   Procedure: Coronary Stent Intervention;  Surgeon: Burnell Blanks, MD;  Location: [facility];  Service: Cardiovascular;  Laterality: N/A;  . CATARACT EXTRACTION W/PHACO Left 07/12/2016   Procedure: CATARACT EXTRACTION PHACO AND INTRAOCULAR LENS PLACEMENT (IOC);  Surgeon: Eulogio Bear, MD;  Location: [facility] OR[facility];  Service: Ophthalmology;  Laterality: Left;  [place]  00:50 AP% 9.2 CDE 4.63 fluid pack lot # [id number]  . CORONARY ANGIOPLASTY     STENT 5/17  . TOTAL HIP ARTHROPLASTY Left 06/03/2019   Procedure: TOTAL HIP ARTHROPLASTY;  Surgeon: Dereck Leep, MD;  Location: [facility] OR[facility];  Service: Orthopedics;  Laterality: Left;    Social History   Tobacco Use  . Smoking status: Former Smoker    Types: Cigarettes    Quit date: 01/18/2015    Years since quitting: 5.1  . Smokeless tobacco: Never Used  Substance Use Topics  . Alcohol use: Yes    Alcohol/week: 12.0 standard drinks    Types: 12 Cans of beer per week  . Drug use: No   Family History  Problem Relation Age of Onset  . Dementia Mother   . Hypertension Father   . Heart disease Father        MI  . Cancer Maternal  Grandmother        lung  . Heart failure Paternal Grandfather    Allergies  Allergen Reactions  . Ace Inhibitors Cough   Current Outpatient Medications on File Prior to Visit  Medication Sig Dispense Refill  . aspirin 81 MG tablet Take 81 mg by mouth daily.    Marland Kitchen atorvastatin (LIPITOR) 80 MG tablet TAKE 1 TABLET (80 MG TOTAL) BY MOUTH DAILY AT 6 PM. NEEDS APPT WITH DR Angelena Form FOR APRIL FOR FURTHER REFILLS 30 tablet 2  . carvedilol (COREG) 6.25 MG tablet TAKE 1 TABLET (6.25 MG TOTAL) BY MOUTH 2 (TWO) TIMES DAILY WITH A MEAL. 60 tablet 4  . celecoxib (CELEBREX) 200 MG capsule Take 1 capsule (200 mg total) by mouth 2 (two) times daily. 60 capsule 1  . Cyanocobalamin (VITAMIN B-12) 5000 MCG TBDP Take 5,000 mcg by mouth 2 (two) times a day.     . nitroGLYCERIN (NITROSTAT) 0.4 MG SL tablet Place 1 tablet (0.4 mg total) under the tongue every 5 (five) minutes as needed for chest pain (CP or SOB). 25 tablet 3  . oxyCODONE (OXY IR/ROXICODONE) 5 MG immediate release tablet Take 1 tablet (5 mg total) by mouth every 4 (four) hours as needed for moderate pain (pain score 4-6). 30 tablet 0  . Probiotic Product (PROBIOTIC PO) Take 1 capsule by mouth daily.    . sildenafil (VIAGRA) 50 MG tablet Take 1 tablet (50 mg total) by mouth daily as needed. Take one by mouth once daily as directed as needed 30 minutes before sexual activity. (Patient taking differently: Take 50 mg by mouth daily as needed for erectile dysfunction. ) 10 tablet 11  . traMADol  (ULTRAM) 50 MG tablet Take 1-2 tablets (50-100 mg total) by mouth every 4 (four) hours as needed for moderate pain. 30 tablet 1  . enoxaparin (LOVENOX) 40 MG/0.4ML injection Inject 0.4 mLs (40 mg total) into the skin daily for 14 doses. 5.6 mL 0   No current facility-administered medications on file prior to visit.     Review of Systems  Constitutional: Negative for activity change, appetite change, fatigue, fever and unexpected weight change.  HENT: Negative for congestion, rhinorrhea, sore throat and trouble swallowing.   Eyes: Negative for pain, redness, itching and visual disturbance.  Respiratory: Negative for cough, chest tightness, shortness of breath and wheezing.   Cardiovascular: Negative for chest pain and palpitations.  Gastrointestinal: Negative for abdominal pain, blood in stool, constipation, diarrhea and nausea.  Endocrine: Negative for cold intolerance, heat intolerance, polydipsia and polyuria.  Genitourinary: Negative for difficulty urinating, dysuria, frequency and urgency.  Musculoskeletal: Positive for arthralgias and back pain. Negative for joint swelling and myalgias.       Hip pain -awaiting hip replacement  OA in hands-also painful  Skin: Negative for pallor and rash.  Neurological: Negative for dizziness, tremors, weakness, numbness and headaches.  Hematological: Negative for adenopathy. Does not bruise/bleed easily.  Psychiatric/Behavioral: Negative for decreased concentration and dysphoric mood. The patient is not nervous/anxious.        Objective:   Physical Exam Constitutional:      General: He is not in acute distress.    Appearance: Normal appearance. He is well-developed. He is obese. He is not ill-appearing or diaphoretic.  HENT:     Head: Normocephalic and atraumatic.     Right Ear: Tympanic membrane, ear canal and external ear normal.     Left Ear: Tympanic membrane, ear canal and external ear normal.  Nose: Nose normal. No congestion.      Mouth/Throat:     Mouth: Mucous membranes are moist.     Pharynx: Oropharynx is clear. No posterior oropharyngeal erythema.  Eyes:     General: No scleral icterus.       Right eye: No discharge.        Left eye: No discharge.     Conjunctiva/sclera: Conjunctivae normal.     Pupils: Pupils are equal, round, and reactive to light.  Neck:     Thyroid: No thyromegaly.     Vascular: No carotid bruit or JVD.  Cardiovascular:     Rate and Rhythm: Normal rate and regular rhythm.     Pulses: Normal pulses.     Heart sounds: Normal heart sounds. No gallop.   Pulmonary:     Effort: Pulmonary effort is normal. No respiratory distress.     Breath sounds: Normal breath sounds. No wheezing or rales.     Comments: Good air exch Chest:     Chest wall: No tenderness.  Abdominal:     General: Bowel sounds are normal. There is no distension or abdominal bruit.     Palpations: Abdomen is soft. There is no mass.     Tenderness: There is no abdominal tenderness.     Hernia: No hernia is present.  Musculoskeletal:        General: No tenderness.     Cervical back: Normal range of motion and neck supple. No rigidity. No muscular tenderness.     Right lower leg: No edema.     Left lower leg: No edema.     Comments: Poor rom hips   Lymphadenopathy:     Cervical: No cervical adenopathy.  Skin:    General: Skin is warm and dry.     Coloration: Skin is not pale.     Findings: No erythema or rash.     Comments: Many tattoos  Solar lentigines diffusely  Ruddy complexion  Neurological:     Mental Status: He is alert.     Cranial Nerves: No cranial nerve deficit.     Motor: No abnormal muscle tone.     Coordination: Coordination normal.     Gait: Gait normal.     Deep Tendon Reflexes: Reflexes are normal and symmetric. Reflexes normal.  Psychiatric:        Mood and Affect: Mood normal.        Cognition and Memory: Cognition and memory normal.           Assessment & Plan:   Problem List  Items Addressed This Visit      Cardiovascular and Mediastinum   HYPERTENSION, BENIGN ESSENTIAL    bp in fair control at this time  BP Readings from Last 1 Encounters:  03/11/20 136/86   No changes needed Most recent labs reviewed  Disc lifstyle change with low sodium diet and exercise  Continues cardiology f/u      Relevant Medications   amLODipine (NORVASC) 5 MG tablet   hydrochlorothiazide (HYDRODIURIL) 25 MG tablet   valsartan (DIOVAN) 80 MG tablet     Endocrine   Hyperlipidemia associated with type 2 diabetes mellitus ([place])    Disc goals for lipids and reasons to control them Rev last labs with pt Rev low sat fat diet in detail LDL is well controlled with atorvastatin  HDL is very low- this is chronic but worse w/o ability to exercise (this will change after hip replacement)  Relevant Medications   metFORMIN (GLUCOPHAGE) 1000 MG tablet   valsartan (DIOVAN) 80 MG tablet   Type 2 diabetes mellitus with hyperglycemia, without long-term current use of insulin ([place])    . Lab Results  Component Value Date   HGBA1C 6.5 01/22/2020   Great improvement with better diet  Wt is stable Plans on exercise after hip is replaced Continue metformin  F/u 6 mo  Pt is unable to schedule eye exam until hip surgery is done/over  Will plan a pna vaccine at next PE       Relevant Medications   metFORMIN (GLUCOPHAGE) 1000 MG tablet   valsartan (DIOVAN) 80 MG tablet     Genitourinary   BPH (benign prostatic hyperplasia)    No clinical changes Lab Results  Component Value Date   PSA 0.14 01/22/2020   PSA 0.26 12/26/2018   PSA 0.27 07/12/2017            Other   B12 deficiency    Improved with supplementation Lab Results  Component Value Date   VITAMINB12 301 01/22/2020         Morbid obesity ([place])    Discussed how this problem influences overall health and the risks it imposes  Reviewed plan for weight loss with lower calorie diet (via better food choices and  also portion control or program like weight watchers) and exercise building up to or more than 30 minutes 5 days per week including some aerobic activity   Diet is better and DM2 is better controlled Will be able to exercise after hip is replaced       Relevant Medications   metFORMIN (GLUCOPHAGE) 1000 MG tablet   Routine general medical examination at a health care facility - Primary    Reviewed health habits including diet and exercise and skin cancer prevention Reviewed appropriate screening tests for age  Also reviewed health mt list, fam hx and immunization status , as well as social and family history   See HPI Labs reviewed  Considering covid vaccine shingrix discussed - plans to check coverage Will need pna vaccine at next PE Give ifob kit for colon screening (declines colonoscopy)       Prostate cancer screening    No fam hx No clinical changes Lab Results  Component Value Date   PSA 0.14 01/22/2020   PSA 0.26 12/26/2018   PSA 0.27 07/12/2017          Thrombocytopenia ([place])    Platelet ct 119  Continues to watch  No bleed/bruising  Aware for hip surgery in the future (no problem with last one)

## 2020-03-11 NOTE — Assessment & Plan Note (Signed)
No clinical changes Lab Results  Component Value Date   PSA 0.14 01/22/2020   PSA 0.26 12/26/2018   PSA 0.27 07/12/2017

## 2020-03-11 NOTE — Assessment & Plan Note (Signed)
Disc goals for lipids and reasons to control them Rev last labs with pt Rev low sat fat diet in detail LDL is well controlled with atorvastatin  HDL is very low- this is chronic but worse w/o ability to exercise (this will change after hip replacement)

## 2020-03-11 NOTE — Assessment & Plan Note (Signed)
Reviewed health habits including diet and exercise and skin cancer prevention Reviewed appropriate screening tests for age  Also reviewed health mt list, fam hx and immunization status , as well as social and family history   See HPI Labs reviewed  Considering covid vaccine shingrix discussed - plans to check coverage Will need pna vaccine at next PE Give ifob kit for colon screening (declines colonoscopy)

## 2020-03-11 NOTE — Assessment & Plan Note (Signed)
Improved with supplementation Lab Results  Component Value Date   VITAMINB12 301 01/22/2020

## 2020-03-11 NOTE — Patient Instructions (Addendum)
Think about covid vaccination   Then shingrix  If you are interested in the shingles vaccine series (Shingrix), call your insurance or pharmacy to check on coverage and location it must be given.  If affordable - you can schedule it here or at your pharmacy depending on coverage   Follow up in 6 months   Please do ifob kit for colon cancer screening

## 2020-03-11 NOTE — Assessment & Plan Note (Signed)
Platelet ct 119  Continues to watch  No bleed/bruising  Aware for hip surgery in the future (no problem with last one)

## 2020-03-11 NOTE — Assessment & Plan Note (Signed)
Discussed how this problem influences overall health and the risks it imposes  Reviewed plan for weight loss with lower calorie diet (via better food choices and also portion control or program like weight watchers) and exercise building up to or more than 30 minutes 5 days per week including some aerobic activity   Diet is better and DM2 is better controlled Will be able to exercise after hip is replaced

## 2020-03-11 NOTE — Assessment & Plan Note (Signed)
No fam hx No clinical changes Lab Results  Component Value Date   PSA 0.14 01/22/2020   PSA 0.26 12/26/2018   PSA 0.27 07/12/2017

## 2020-03-11 NOTE — Assessment & Plan Note (Signed)
bp in fair control at this time  BP Readings from Last 1 Encounters:  03/11/20 136/86   No changes needed Most recent labs reviewed  Disc lifstyle change with low sodium diet and exercise  Continues cardiology f/u

## 2020-03-14 ENCOUNTER — Other Ambulatory Visit: Payer: Self-pay | Admitting: Cardiovascular Disease

## 2020-03-15 ENCOUNTER — Other Ambulatory Visit: Payer: Self-pay

## 2020-03-15 MED ORDER — CARVEDILOL 6.25 MG PO TABS: 6.2500 mg | ORAL_TABLET | Freq: Two times a day (BID) | ORAL | 0 refills | Status: DC

## 2020-03-15 NOTE — Telephone Encounter (Signed)
Pt's medication was sent to pt's pharmacy as requested. Confirmation received.  °

## 2020-03-20 NOTE — Discharge Instructions (Signed)
Instructions after Total Hip Replacement      P. , Jr., M.D.     Dept. of Orthopaedics & Sports Medicine  [facility]  [address]  [place], [place]  [address]  Phone: [phone]   Fax: [phone]    DIET: . Drink plenty of non-alcoholic fluids. . Resume your normal diet. Include foods high in fiber.  ACTIVITY:  . You may use crutches or a walker with weight-bearing as tolerated, unless instructed otherwise. . You may be weaned off of the walker or crutches by your Physical Therapist.  . Do NOT reach below the level of your knees or cross your legs until allowed.    . Continue doing gentle exercises. Exercising will reduce the pain and swelling, increase motion, and prevent muscle weakness.   . Please continue to use the TED compression stockings for 6 weeks. You may remove the stockings at night, but should reapply them in the morning. . Do not drive or operate any equipment until instructed.  WOUND CARE:  . Continue to use ice packs periodically to reduce pain and swelling. . Keep the incision clean and dry. . You may bathe or shower after the staples are removed at the first office visit following surgery.  MEDICATIONS: . You may resume your regular medications. . Please take the pain medication as prescribed on the medication. . Do not take pain medication on an empty stomach. . You have been given a prescription for a blood thinner to prevent blood clots. Please take the medication as instructed. (NOTE: After completing a 2 week course of Lovenox, take one Enteric-coated aspirin once a day.) . Pain medications and iron supplements can cause constipation. Use a stool softener (Senokot or Colace) on a daily basis and a laxative (dulcolax or miralax) as needed. . Do not drive or drink alcoholic beverages when taking pain medications.  CALL THE OFFICE FOR: . Temperature above 101 degrees . Excessive bleeding or drainage on the dressing. . Excessive  swelling, coldness, or paleness of the toes. . Persistent nausea and vomiting.  FOLLOW-UP:  . You should have an appointment to return to the office in 6 weeks after surgery. . Arrangements have been made for continuation of Physical Therapy (either home therapy or outpatient therapy).     [facility] Department Directory         [email or web address]       [email or web address]          Cardiology  Appointments: [place] - [phone] [place] - [phone]  Endocrinology  Appointments: [place] - [phone] [place] - [phone]  Gastroenterology  Appointments: [place] - [phone] [place] - [phone]        General Surgery   Appointments: [place] - [phone]  Internal Medicine/Family Medicine  Appointments: [place] - [phone] [place] - [phone] [place] - [phone]  Metabolic and Weigh Loss Surgery  Appointments: [place] - [phone]        Neurology  Appointments: [place] - [phone] [place] - [phone]  Neurosurgery  Appointments: [place] - [phone]  Obstetrics & Gynecology  Appointments: [place] - [phone] [place] - [phone]        Pediatrics  Appointments: [place] - [phone] [place] - [phone]  Physiatry  Appointments: [place] -[phone]  Physical Therapy  Appointments: [place] - [phone] [place] - [phone]        Podiatry  Appointments: [place] - [phone] [place] - [phone]  Pulmonology  Appointments: [place] - [phone]  Rheumatology  Appointments:  - [phone]         Location: [facility]  [address] [place], [place]  [address]  [place] Location: [facility] [address] [place], [place]  [address]  [place] Location: [facility] [address] [place],   [address]    

## 2020-03-21 ENCOUNTER — Other Ambulatory Visit: Payer: Self-pay

## 2020-03-21 ENCOUNTER — Encounter
Admission: RE | Admit: 2020-03-21 | Discharge: 2020-03-21 | Disposition: A | Discharge status: Home / Self Care | Payer: BC Managed Care – PPO | Source: Ambulatory Visit | Attending: Orthopedic Surgery | Admitting: Orthopedic Surgery

## 2020-03-21 DIAGNOSIS — Z01812 Encounter for preprocedural laboratory examination: Secondary | ICD-10-CM | POA: Insufficient documentation

## 2020-03-21 HISTORY — DX: Unspecified osteoarthritis, unspecified site: M19.90

## 2020-03-21 NOTE — Patient Instructions (Signed)
Your procedure is scheduled on: Wednesday Mar 30, 2020 Report to Day Surgery. To find out your arrival time please call [phone] between 1PM - 3PM on Tuesday Mar 29, 2020.  Remember: Instructions that are not followed completely may result in serious medical risk,  up to and including death, or upon the discretion of your surgeon and anesthesiologist your  surgery may need to be rescheduled.     _X__ 1. Do not eat food after midnight the night before your procedure.                 No gum chewing or hard candies. You may drink clear liquids up to 2 hours                 before you are scheduled to arrive for your surgery- DO not drink clear                 liquids within 2 hours of the start of your surgery.                 Clear Liquids include:  water, apple juice without pulp, clear Gatorade, G2 or                  Gatorade Zero (avoid Red/Purple/Blue), Black Coffee or Tea (Do not add                 anything to coffee or tea).  __X__2.   Complete the carbohydrate drink provided to you, 2 hours before arrival.  __X__3.  On the morning of surgery brush your teeth with toothpaste and water, you                may rinse your mouth with mouthwash if you wish.  Do not swallow any toothpaste of mouthwash.     _X__ 4.  No Alcohol for 24 hours before or after surgery.   _X__ 5.  Do Not Smoke or use e-cigarettes For 24 Hours Prior to Your Surgery.                 Do not use any chewable tobacco products for at least 6 hours prior to                 Surgery.  _X__  6.  Do not use any recreational drugs (marijuana, cocaine, heroin, ecstacy, MDMA or other)                For at least one week prior to your surgery.  Combination of these drugs with anesthesia                May have life threatening results.  __x__  7.  Notify your doctor if there is any change in your medical condition      (cold, fever, infections).     Do not wear jewelry, make-up, hairpins,  clips or nail polish. Do not wear lotions, powders, or perfumes. You may wear deodorant. Do not shave 48 hours prior to surgery. Men may shave face and neck. Do not bring valuables to the hospital.    [facility] is not responsible for any belongings or valuables.  Contacts, dentures or bridgework may not be worn into surgery. Leave your suitcase in the car. After surgery it may be brought to your room. For patients admitted to the hospital, discharge time is determined by your treatment team.   Patients discharged the day of surgery will not be allowed  to drive home.   Make arrangements for someone to be with you for the first 24 hours of your Same Day Discharge.    Please read over the following fact sheets that you were given:   Total Joint Packet   __X__ Take these medicines the morning of surgery with A SIP OF WATER:    1. amLODipine (NORVASC) 5 MG   2. carvedilol (COREG)   3. celecoxib (CELEBREX) 200 MG   __X__ Use CHG Soap as directed  __X__ Stop metformin 2 days prior to surgery (Last dose will be Sunday May 23)    __X__ Call your cardiologist for instructions on when to stop aspirin 81 mg.   __X__ Stop Anti-inflammatories such as Ibuprofen, Aleve, naproxen, and or BC powders.    __X__ Stop supplements until after surgery.    __X__ Do not start any herbal supplements before your surgery.

## 2020-03-24 ENCOUNTER — Other Ambulatory Visit: Payer: Self-pay

## 2020-03-24 ENCOUNTER — Encounter
Admission: RE | Admit: 2020-03-24 | Discharge: 2020-03-24 | Disposition: A | Discharge status: Home / Self Care | Payer: BC Managed Care – PPO | Source: Ambulatory Visit | Attending: Orthopedic Surgery | Admitting: Orthopedic Surgery

## 2020-03-24 DIAGNOSIS — M1611 Unilateral primary osteoarthritis, right hip: Secondary | ICD-10-CM | POA: Diagnosis not present

## 2020-03-24 DIAGNOSIS — I119 Hypertensive heart disease without heart failure: Secondary | ICD-10-CM | POA: Insufficient documentation

## 2020-03-24 DIAGNOSIS — Z87891 Personal history of nicotine dependence: Secondary | ICD-10-CM | POA: Diagnosis not present

## 2020-03-24 DIAGNOSIS — Z79899 Other long term (current) drug therapy: Secondary | ICD-10-CM | POA: Insufficient documentation

## 2020-03-24 DIAGNOSIS — K219 Gastro-esophageal reflux disease without esophagitis: Secondary | ICD-10-CM | POA: Diagnosis not present

## 2020-03-24 DIAGNOSIS — E119 Type 2 diabetes mellitus without complications: Secondary | ICD-10-CM | POA: Insufficient documentation

## 2020-03-24 DIAGNOSIS — Z01818 Encounter for other preprocedural examination: Secondary | ICD-10-CM | POA: Diagnosis present

## 2020-03-24 DIAGNOSIS — Z7982 Long term (current) use of aspirin: Secondary | ICD-10-CM | POA: Diagnosis not present

## 2020-03-24 DIAGNOSIS — Z7984 Long term (current) use of oral hypoglycemic drugs: Secondary | ICD-10-CM | POA: Insufficient documentation

## 2020-03-24 LAB — CBC
HCT: 41.8 % (ref 39.0–52.0)
Hemoglobin: 14.5 g/dL (ref 13.0–17.0)
MCH: 32.1 pg (ref 26.0–34.0)
MCHC: 34.7 g/dL (ref 30.0–36.0)
MCV: 92.5 fL (ref 80.0–100.0)
Platelets: 125 10*3/uL — ABNORMAL LOW (ref 150–400)
RBC: 4.52 MIL/uL (ref 4.22–5.81)
RDW: 12 % (ref 11.5–15.5)
WBC: 6.5 10*3/uL (ref 4.0–10.5)
nRBC: 0 % (ref 0.0–0.2)

## 2020-03-24 LAB — URINALYSIS, ROUTINE W REFLEX MICROSCOPIC
Bilirubin Urine: NEGATIVE
Glucose, UA: 50 mg/dL — AB
Hgb urine dipstick: NEGATIVE
Ketones, ur: NEGATIVE mg/dL
Leukocytes,Ua: NEGATIVE
Nitrite: NEGATIVE
Protein, ur: NEGATIVE mg/dL
Specific Gravity, Urine: 1.023 (ref 1.005–1.030)
pH: 5 (ref 5.0–8.0)

## 2020-03-24 LAB — TYPE AND SCREEN
ABO/RH(D): O POS
Antibody Screen: NEGATIVE

## 2020-03-24 LAB — COMPREHENSIVE METABOLIC PANEL
ALT: 54 U/L — ABNORMAL HIGH (ref 0–44)
AST: 38 U/L (ref 15–41)
Albumin: 4.2 g/dL (ref 3.5–5.0)
Alkaline Phosphatase: 94 U/L (ref 38–126)
Anion gap: 10 (ref 5–15)
BUN: 20 mg/dL (ref 6–20)
CO2: 24 mmol/L (ref 22–32)
Calcium: 9.1 mg/dL (ref 8.9–10.3)
Chloride: 106 mmol/L (ref 98–111)
Creatinine, Ser: 0.77 mg/dL (ref 0.61–1.24)
GFR calc Af Amer: >60 mL/min (ref >60)
GFR calc non Af Amer: >60 mL/min (ref >60)
Glucose, Bld: 152 mg/dL — ABNORMAL HIGH (ref 70–99)
Potassium: 4.1 mmol/L (ref 3.5–5.1)
Sodium: 140 mmol/L (ref 135–145)
Total Bilirubin: 1 mg/dL (ref 0.3–1.2)
Total Protein: 7.3 g/dL (ref 6.5–8.1)

## 2020-03-24 LAB — APTT: aPTT: 34 seconds (ref 24–36)

## 2020-03-24 LAB — SURGICAL PCR SCREEN
MRSA, PCR: NEGATIVE
Staphylococcus aureus: NEGATIVE

## 2020-03-24 LAB — PROTIME-INR
INR: 1 (ref 0.8–1.2)
Prothrombin Time: 12.5 seconds (ref 11.4–15.2)

## 2020-03-24 LAB — SEDIMENTATION RATE: Sed Rate: 10 mm/hr (ref 0–20)

## 2020-03-24 LAB — C-REACTIVE PROTEIN: CRP: <0.5 mg/dL (ref <1.0)

## 2020-03-25 LAB — URINE CULTURE
Culture: NO GROWTH
Special Requests: NORMAL

## 2020-03-25 LAB — HEMOGLOBIN A1C
Hgb A1c MFr Bld: 7.1 % — ABNORMAL HIGH (ref 4.8–5.6)
Mean Plasma Glucose: 157 mg/dL

## 2020-03-28 ENCOUNTER — Ambulatory Visit: Payer: BC Managed Care – PPO | Admitting: Cardiovascular Disease

## 2020-03-28 ENCOUNTER — Other Ambulatory Visit
Admission: RE | Admit: 2020-03-28 | Discharge: 2020-03-28 | Disposition: A | Discharge status: Home / Self Care | Payer: BC Managed Care – PPO | Source: Ambulatory Visit | Attending: Orthopedic Surgery | Admitting: Orthopedic Surgery

## 2020-03-28 ENCOUNTER — Encounter: Payer: Self-pay | Admitting: Cardiovascular Disease

## 2020-03-28 ENCOUNTER — Other Ambulatory Visit: Payer: Self-pay

## 2020-03-28 VITALS — BP 130/68 | HR 66 | Ht 70.0 in | Wt 273.0 lb

## 2020-03-28 DIAGNOSIS — E7800 Pure hypercholesterolemia, unspecified: Secondary | ICD-10-CM

## 2020-03-28 DIAGNOSIS — I10 Essential (primary) hypertension: Secondary | ICD-10-CM | POA: Diagnosis not present

## 2020-03-28 DIAGNOSIS — Z01818 Encounter for other preprocedural examination: Secondary | ICD-10-CM

## 2020-03-28 DIAGNOSIS — Z01812 Encounter for preprocedural laboratory examination: Secondary | ICD-10-CM | POA: Insufficient documentation

## 2020-03-28 DIAGNOSIS — I2510 Atherosclerotic heart disease of native coronary artery without angina pectoris: Secondary | ICD-10-CM

## 2020-03-28 DIAGNOSIS — Z20822 Contact with and (suspected) exposure to covid-19: Secondary | ICD-10-CM | POA: Insufficient documentation

## 2020-03-28 LAB — SARS CORONAVIRUS 2 (TAT 6-24 HRS): SARS Coronavirus 2: NEGATIVE

## 2020-03-28 NOTE — Patient Instructions (Signed)

## 2020-03-28 NOTE — Progress Notes (Signed)
Chief Complaint  Patient presents with  . Follow-up    CAD    History of Present Illness: 54 yo male with history of CAD, DM, HTN, prior CVA here today for cardiac follow up. He was admitted to [facility] May 2017 with a NSTEMI. His distal RCA was totally occluded. A Promus DES was placed in the distal RCA. Echo with normal LV function. Episode of chest pain October 2018 and was seen in the ED with negative workup. Pt cancelled outpatient stress test in November 2018.   He is here today for follow up. The patient denies any chest pain, dyspnea, palpitations, lower extremity edema, orthopnea, PND, dizziness, near syncope or syncope.   Primary Care Physician: Tower, Audrie Gallus, MD  Past Medical History:  Diagnosis Date  . Arthritis   . CAD (coronary artery disease)    a. NSTEMI: 100% stenosis of the distal RCA --> DES placed 5/17 CLEARED BY CARDIOLOGIST  . Chronic low back pain    a. s/p back surgery.  . CVA (cerebral infarction)    a. 02/2015 dyarthria 2/2 CVA involving the lateral aspect of the precentral gyrus.  . DM2 (diabetes mellitus, type 2) (HCC)    a. pre-diabetic in the past. b. A1c 03/2016 elevated to 7.9.  . ETOH abuse   . GERD (gastroesophageal reflux disease)   . History of echocardiogram    a. Mild LVH, EF 55-60%, Definity contrast used-LV wall motion could not be adequately assessed  . History of non-ST elevation myocardial infarction (NSTEMI) 03/2016   a. PCI: 3.5 x 24 mm Promus Premier DES to distal RCA  . History of tobacco abuse   . Hypertension    a. 02/2015 echo: 55-65%, trace TR/MR. b. 03/2016: echo with EF of 55-60%.   . Myocardial infarction (HCC)   . Obesity   . Ruptured disk   . Stroke [facility])    2016    Past Surgical History:  Procedure Laterality Date  . BACK SURGERY  2000   ruptured disk, L-S  . CARDIAC CATHETERIZATION  05/2004   minimal CAD  . CARDIAC CATHETERIZATION N/A 03/10/2016   Procedure: Left Heart Cath and Coronary Angiography;  Surgeon:  Kathleene Hazel, MD;  Location: [facility] INVASIVE CV LAB;  Service: Cardiovascular;  Laterality: N/A;  . CARDIAC CATHETERIZATION N/A 03/10/2016   Procedure: Coronary Stent Intervention;  Surgeon: Kathleene Hazel, MD;  Location: [facility] INVASIVE CV LAB;  Service: Cardiovascular;  Laterality: N/A;  . CATARACT EXTRACTION W/PHACO Left 07/12/2016   Procedure: CATARACT EXTRACTION PHACO AND INTRAOCULAR LENS PLACEMENT (IOC);  Surgeon: Nevada Crane, MD;  Location: [facility] ORS;  Service: Ophthalmology;  Laterality: Left;  [place]  00:50 AP% 9.2 CDE 4.63 fluid pack lot # [id number]  . CORONARY ANGIOPLASTY     STENT 5/17  . TOTAL HIP ARTHROPLASTY Left 06/03/2019   Procedure: TOTAL HIP ARTHROPLASTY;  Surgeon: Donato Heinz, MD;  Location: [facility] ORS;  Service: Orthopedics;  Laterality: Left;    Current Outpatient Medications  Medication Sig Dispense Refill  . amLODipine (NORVASC) 5 MG tablet Take 1 tablet (5 mg total) by mouth daily. 30 tablet 11  . aspirin 81 MG tablet Take 81 mg by mouth daily.    Marland Kitchen atorvastatin (LIPITOR) 80 MG tablet TAKE 1 TABLET (80 MG TOTAL) BY MOUTH DAILY AT 6 PM. NEEDS APPT WITH DR Clifton James FOR APRIL FOR FURTHER REFILLS (Patient taking differently: Take 80 mg by mouth daily. ) 30 tablet 2  . carvedilol (COREG) 6.25  MG tablet Take 1 tablet (6.25 mg total) by mouth 2 (two) times daily with a meal. Please keep upcoming appt in May with Dr. Angelena Form before anymore refills. Thank you (Patient taking differently: Take 6.25 mg by mouth 2 (two) times daily with a meal. ) 60 tablet 0  . celecoxib (CELEBREX) 200 MG capsule Take 1 capsule (200 mg total) by mouth 2 (two) times daily. 60 capsule 1  . Cyanocobalamin (VITAMIN B-12) 5000 MCG TBDP Take 5,000 mcg by mouth 2 (two) times a day.     . hydrochlorothiazide (HYDRODIURIL) 25 MG tablet Take 0.5 tablets (12.5 mg total) by mouth daily. 15 tablet 11  . metFORMIN (GLUCOPHAGE) 1000 MG tablet Take 1 tablet (1,000 mg total) by mouth 2 (two) times daily  with a meal. 60 tablet 11  . nitroGLYCERIN (NITROSTAT) 0.4 MG SL tablet Place 1 tablet (0.4 mg total) under the tongue every 5 (five) minutes as needed for chest pain (CP or SOB). 25 tablet 3  . oxyCODONE (OXY IR/ROXICODONE) 5 MG immediate release tablet Take 1 tablet (5 mg total) by mouth every 4 (four) hours as needed for moderate pain (pain score 4-6). 30 tablet 0  . Probiotic Product (PROBIOTIC PO) Take 1 capsule by mouth daily.    . sildenafil (VIAGRA) 50 MG tablet Take 1 tablet (50 mg total) by mouth daily as needed. Take one by mouth once daily as directed as needed 30 minutes before sexual activity. (Patient taking differently: Take 50 mg by mouth daily as needed for erectile dysfunction. ) 10 tablet 11  . traMADol (ULTRAM) 50 MG tablet Take 1-2 tablets (50-100 mg total) by mouth every 4 (four) hours as needed for moderate pain. 30 tablet 1  . valsartan (DIOVAN) 80 MG tablet Take 1 tablet (80 mg total) by mouth daily. 30 tablet 11  . enoxaparin (LOVENOX) 40 MG/0.4ML injection Inject 0.4 mLs (40 mg total) into the skin daily for 14 doses. (Patient not taking: Reported on 03/15/2020) 5.6 mL 0   No current facility-administered medications for this visit.    Allergies  Allergen Reactions  . Ace Inhibitors Cough    Social History   Socioeconomic History  . Marital status: Married    Spouse name: Not on file  . Number of children: 1  . Years of education: Not on file  . Highest education level: Not on file  Occupational History  . Occupation: Administrator, Civil Service  Tobacco Use  . Smoking status: Former Smoker    Types: Cigarettes    Quit date: 01/18/2015    Years since quitting: 5.1  . Smokeless tobacco: Never Used  Substance and Sexual Activity  . Alcohol use: Yes    Alcohol/week: 12.0 standard drinks    Types: 12 Cans of beer per week  . Drug use: No  . Sexual activity: Yes    Partners: Female  Other Topics Concern  . Not on file  Social History Narrative   Lives in [place] with  wife.  Does not routinely exercise.   Social Determinants of Health   Financial Resource Strain:   . Difficulty of Paying Living Expenses:   Food Insecurity:   . Worried About Charity fundraiser in the Last Year:   . Arboriculturist in the Last Year:   Transportation Needs:   . Film/video editor (Medical):   Marland Kitchen Lack of Transportation (Non-Medical):   Physical Activity:   . Days of Exercise per Week:   . Minutes of Exercise  per Session:   Stress:   . Feeling of Stress :   Social Connections:   . Frequency of Communication with Friends and Family:   . Frequency of Social Gatherings with Friends and Family:   . Attends Religious Services:   . Active Member of Clubs or Organizations:   . Attends Banker Meetings:   Marland Kitchen Marital Status:   Intimate Partner Violence:   . Fear of Current or Ex-Partner:   . Emotionally Abused:   Marland Kitchen Physically Abused:   . Sexually Abused:     Family History  Problem Relation Age of Onset  . Dementia Mother   . Hypertension Father   . Heart disease Father        MI  . Cancer Maternal Grandmother        lung  . Heart failure Paternal Grandfather     Review of Systems:  As stated in the HPI and otherwise negative.   BP 130/68   Pulse 66   Ht 5\' 10"  (1.778 m)   Wt 273 lb (123.8 kg)   SpO2 97%   BMI 39.17 kg/m   Physical Examination:  General: Well developed, well nourished, NAD  HEENT: OP clear, mucus membranes moist  SKIN: warm, dry. No rashes. Neuro: No focal deficits  Musculoskeletal: Muscle strength 5/5 all ext  Psychiatric: Mood and affect normal  Neck: No JVD, no carotid bruits, no thyromegaly, no lymphadenopathy.  Lungs:Clear bilaterally, no wheezes, rhonci, crackles Cardiovascular: Regular rate and rhythm. No murmurs, gallops or rubs. Abdomen:Soft. Bowel sounds present. Non-tender.  Extremities: No lower extremity edema. Pulses are 2 + in the bilateral DP/PT.  Cardiac cath May 2017: Conclusion    Prox RCA  lesion, 30% stenosed.  Dist RCA lesion, 100% stenosed. Post intervention, there is a 0% residual stenosis.   1. NSTEMI secondary to occluded distal RCA 2. Successful PTCA/DES of the distal RCA 3. No obstructive disease in the LAD or Circumflex 4. Mild LV systolic dysfunction.    Echo May 2017: Procedure narrative: Transthoracic echocardiography. Image   quality was adequate. The study was technically difficult, as a   result of poor sound wave transmission. Intravenous contrast   (Definity) was administered. - Left ventricle: The cavity size was mildly dilated. Wall   thickness was increased in a pattern of mild LVH. Systolic   function was normal. The estimated ejection fraction was in the   range of 55% to 60%. Images were inadequate for LV wall motion   assessment. The study is not technically sufficient to allow   evaluation of LV diastolic function. - Left atrium: The atrium was normal in size. - Inferior vena cava: The vessel was normal in size. The   respirophasic diameter changes were in the normal range (>= 50%),   consistent with normal central venous pressure.  EKG:  EKG is not  ordered today. The ekg ordered today demonstrates   Recent Labs: 01/22/2020: TSH 2.09 03/24/2020: ALT 54; BUN 20; Creatinine, Ser 0.77; Hemoglobin 14.5; Platelets 125; Potassium 4.1; Sodium 140   Lipid Panel    Component Value Date/Time   CHOL 66 01/22/2020 0950   CHOL 150 02/27/2015 0645   TRIG 48.0 01/22/2020 0950   TRIG 98 02/27/2015 0645   HDL 26.00 (L) 01/22/2020 0950   HDL 34 (L) 02/27/2015 0645   CHOLHDL 3 01/22/2020 0950   VLDL 9.6 01/22/2020 0950   VLDL 20 02/27/2015 0645   LDLCALC 31 01/22/2020 0950   LDLCALC 96 02/27/2015 0645  Wt Readings from Last 3 Encounters:  03/28/20 273 lb (123.8 kg)  03/21/20 269 lb (122 kg)  03/11/20 273 lb 5 oz (124 kg)     Other studies Reviewed: Additional studies/ records that were reviewed today include: . Review of the above  records demonstrates:   Assessment and Plan:   1. CAD without angina: No chest pain. Continue ASA, statin and beta blocker.    2. HTN: BP controlled. Continue current therapy  3. HLD: LDL at goal. Continue statin  4. Pre-operative cardiovascular examination: He has no chest pain or dyspnea. No evidence of CHF. OK to proceed with his planned surgical procedure.     Current medicines are reviewed at length with the patient today.  The patient does not have concerns regarding medicines.  The following changes have been made:  no change  Labs/ tests ordered today include:  No orders of the defined types were placed in this encounter.    Disposition:   FU with me in 6 months   Signed, Verne Carrow, MD 03/28/2020 4:11 PM    [facility] [address] [place], [place], [place]  [address] Phone: [phone]; Fax: [phone]

## 2020-03-29 ENCOUNTER — Encounter: Payer: Self-pay | Admitting: Orthopedic Surgery

## 2020-03-29 NOTE — H&P (Signed)
ORTHOPAEDIC HISTORY & PHYSICAL Progress Notes Michelene Gardener, [place] - 03/24/2020 8:45 AM EDT [facility] Chief Complaint:   Chief Complaint  Patient presents with  . Hip Pain  H & P RIGHT HIP   History of Present Illness:   Daniel [name] is a 54 y.o. male that presents to clinic today for his preoperative history and evaluation. Patient presents unaccompanied. The patient is scheduled to undergo a right total hip arthroplasty on 03/30/20 by Dr. Ernest Pine. His pain began approximately 2 years ago. The pain is located in the right hip and groin. He describes his pain as worse with weightbearing. He reports associated decreased range of motion of the hip as well as difficulty putting on removing socks and shoes. He denies associated numbness or tingling.   The patient's symptoms have progressed to the point that they decrease his quality of life. The patient has previously undergone conservative treatment including NSAIDS and activity modification without adequate control of his symptoms.  Denies history of blood clots. Patient does report a previous history of lumbar back surgery with hardware and states he does see a cardiologist.  Past Medical, Surgical, Family, Social History, Allergies, Medications:   Past Medical History:  Past Medical History:  Diagnosis Date  . BPH (benign prostatic hyperplasia)  . Diabetes mellitus without complication (CMS-HCC)  . ED (erectile dysfunction)  . GERD (gastroesophageal reflux disease)  . H/O alcohol abuse  . Heart disease  . History of stroke 03/01/2015  Cannot taste or smell  . History of tobacco abuse  . Hypertension   Past Surgical History:  Past Surgical History:  Procedure Laterality Date  . BACK SURGERY  Ruptured disk L-S  . Cardiac cathetherization [id number]  . CATARACT EXTRACTION Left 07/12/2016  Dr Yolonda Kida  . CORONARY ANGIOPLASTY 03/2016  stent placement  . Left total hip  arthroplasty 06/03/2019  Dr Ernest Pine   Current Medications:  Current Outpatient Medications  Medication Sig Dispense Refill  . docusate (STOOL SOFTENER) 100 mg tablet Take 100 mg by mouth 2 (two) times daily as needed for Constipation  . amLODIPine (NORVASC) 5 MG tablet TAKE 1 TABLET (5 MG TOTAL) BY MOUTH DAILY  . aspirin 81 MG EC tablet Take by mouth  . atorvastatin (LIPITOR) 80 MG tablet Take by mouth TAKE 1 TABLET (80 MG TOTAL) BY MOUTH DAILY AT 6 PM  . carvediloL (COREG) 6.25 MG tablet Take by mouth TAKE 1 TABLET (6.25 MG TOTAL) BY MOUTH 2 (TWO) TIMES DAILY WITH A MEAL.  . celecoxib (CELEBREX) 200 MG capsule TAKE 1 CAPSULE BY MOUTH TWICE A DAY 60 capsule 1  . cyanocobalamin, vitamin B-12, 5,000 mcg TbDL Take 1 tablet by mouth once daily  . hydroCHLOROthiazide (HYDRODIURIL) 25 MG tablet Take by mouth Take 0.5 tablets (12.5 mg total) by mouth daily  . Lactobacillus acidophilus (PROBIOTIC) 10 billion cell Cap Take 1 caplet by mouth once daily  . metFORMIN (GLUCOPHAGE) 1000 MG tablet Take 1,000 mg by mouth 2 (two) times daily with meals  . nitroGLYcerin (NITROSTAT) 0.4 MG SL tablet Place under the tongue Place 1 tablet (0.4 mg total) under the tongue every 5 (five) minutes as needed for chest pain (CP or SOB).  Marland Kitchen sildenafiL (VIAGRA) 50 MG tablet Take by mouth Take 1 tablet (50 mg total) by mouth daily as needed. Take one by mouth once daily as directed as needed 30 minutes before sexual activity.  . valsartan (DIOVAN) 80 MG tablet Take 80  mg by mouth once daily   No current facility-administered medications for this visit.   Allergies:  Allergies  Allergen Reactions  . Ace Inhibitors Cough   Social History:  Social History   Socioeconomic History  . Marital status: Married  Spouse name: Barnett Applebaum  . Number of children: 2  . Years of education: 37  . Highest education level: High school graduate  Occupational History  . Occupation: Full-time-Fitter Building control surveyor  Tobacco Use  . Smoking  status: Former Smoker  Packs/day: 1.00  Years: 35.00  Pack years: 35.00  Types: Cigarettes  Quit date: 01/15/2015  Years since quitting: 5.1  . Smokeless tobacco: Never Used  Vaping Use  . Vaping Use: Never used  Substance and Sexual Activity  . Alcohol use: Yes  Alcohol/week: 12.0 standard drinks  Types: 12 Cans of beer per week  Comment: 12 pack  . Drug use: Defer  . Sexual activity: Yes  Partners: Female  Other Topics Concern  . Not on file  Social History Narrative  . Not on file   Social Determinants of Health   Financial Resource Strain:  . Difficulty of Paying Living Expenses:  Food Insecurity:  . Worried About Charity fundraiser in the Last Year:  . Arboriculturist in the Last Year:  Transportation Needs:  . Film/video editor (Medical):  Marland Kitchen Lack of Transportation (Non-Medical):  Physical Activity:  . Days of Exercise per Week:  . Minutes of Exercise per Session:  Stress:  . Feeling of Stress :  Social Connections:  . Frequency of Communication with Friends and Family:  . Frequency of Social Gatherings with Friends and Family:  . Attends Religious Services:  . Active Member of Clubs or Organizations:  . Attends Archivist Meetings:  Marland Kitchen Marital Status:   Family History:  Family History  Problem Relation Age of Onset  . High blood pressure (Hypertension) Father  . Heart disease Father  . Lung cancer Maternal Grandmother  . Heart disease Paternal Grandfather   Review of Systems:   A 10+ ROS was performed, reviewed, and the pertinent orthopaedic findings are documented in the HPI.   Physical Examination:   BP 130/80  Ht 177.8 cm (5\' 10" )  Wt (!) 123.8 kg (273 lb)  BMI 39.17 kg/m   Patient is a well-developed, well-nourished male in no acute distress. Patient has normal mood and affect. Patient is alert and oriented to person, place, and time.   HEENT: Atraumatic, normocephalic. Pupils equal and reactive to light. Extraocular motion  intact. Noninjected sclera.  Cardiovascular: Regular rate and rhythm, with no murmurs, rubs, or gallops. Distal pulses auscultated with Doppler.  Respiratory: Lungs clear to auscultation bilaterally.    Right Hip: Soft tissue swelling: Negative Erythema: Negative Crepitance: Positive Tenderness: Greater trochanter is nontender to palpation. Moderate pain is elicited by axial compression or extremes of rotation. Atrophy: No atrophy. Good hip flexor and abductor strength. Range of Motion: EXT/FLEX: 0/0/100 ADD/ABD: 20/0/20 IR/ER: 0/0/30  Sensation is intact over the saphenous, lateral cutaneous, superficial fibular, and deep fibular nerve distributions.  Tests Performed/Reviewed:  X-rays  Anteroposterior view of the pelvis and anteroposterior and lateral views of the right hip were reviewed from 01/16/2019 and revealed severe loss of femoral acetabular joint space with significant osteophyte formation of the acetabulum and femoral head. Cystic changes noted to the femoral head. Left total hip arthroplasty without signs of loosening, migration, or failure noted. No fractures or dislocations noted.  I personally ordered and  interpreted radiographs obtained today.  Impression:   ICD-10-CM  1. Primary osteoarthritis of right hip M16.11   Plan:   The patient has end-stage degenerative changes of the right hip. It was explained to the patient that the condition is progressive in nature. Having failed conservative treatment, the patient has elected to proceed with a total joint arthroplasty. The patient will undergo a total joint arthroplasty with Dr. Ernest Pine. The risks of surgery, including blood clot and infection, were discussed with the patient. Measures to reduce these risks, including the use of anticoagulation, perioperative antibiotics, and early ambulation were discussed. The importance of postoperative physical therapy was discussed with the patient. The patient elects to proceed with  surgery. The patient is instructed to stop all blood thinners prior to surgery. The patient is instructed to call the hospital the day before surgery to learn of the proper arrival time.   Contact our office with any questions or concerns. Follow up as indicated, or sooner should any new problems arise, if conditions worsen, or if they are otherwise concerned.   Michelene Gardener, PA-C [facility] [address] [place], [place] [address] Phone: [phone]  This note was generated in part with voice recognition software and I apologize for any typographical errors that were not detected and corrected.   Electronically signed by Michelene Gardener, PA at 03/28/2020 6:03 PM EDT

## 2020-03-30 ENCOUNTER — Inpatient Hospital Stay: Payer: BC Managed Care – PPO

## 2020-03-30 ENCOUNTER — Inpatient Hospital Stay: Payer: BC Managed Care – PPO | Admitting: Anesthesiology

## 2020-03-30 ENCOUNTER — Encounter: Payer: Self-pay | Admitting: Orthopedic Surgery

## 2020-03-30 ENCOUNTER — Other Ambulatory Visit: Payer: Self-pay

## 2020-03-30 ENCOUNTER — Inpatient Hospital Stay
Admission: RE | Admit: 2020-03-30 | Discharge: 2020-04-01 | DRG: 470 | Disposition: A | Discharge status: Home Health | Payer: BC Managed Care – PPO | Attending: Orthopedic Surgery | Admitting: Orthopedic Surgery

## 2020-03-30 ENCOUNTER — Encounter
Admission: RE | Disposition: A | Discharge status: Home / Self Care | Payer: Self-pay | Source: Home / Self Care | Attending: Orthopedic Surgery

## 2020-03-30 DIAGNOSIS — E785 Hyperlipidemia, unspecified: Secondary | ICD-10-CM | POA: Diagnosis present

## 2020-03-30 DIAGNOSIS — Z888 Allergy status to other drugs, medicaments and biological substances status: Secondary | ICD-10-CM | POA: Diagnosis not present

## 2020-03-30 DIAGNOSIS — Z8673 Personal history of transient ischemic attack (TIA), and cerebral infarction without residual deficits: Secondary | ICD-10-CM

## 2020-03-30 DIAGNOSIS — Z79899 Other long term (current) drug therapy: Secondary | ICD-10-CM

## 2020-03-30 DIAGNOSIS — K219 Gastro-esophageal reflux disease without esophagitis: Secondary | ICD-10-CM | POA: Diagnosis present

## 2020-03-30 DIAGNOSIS — E1169 Type 2 diabetes mellitus with other specified complication: Secondary | ICD-10-CM | POA: Diagnosis present

## 2020-03-30 DIAGNOSIS — I252 Old myocardial infarction: Secondary | ICD-10-CM

## 2020-03-30 DIAGNOSIS — I2510 Atherosclerotic heart disease of native coronary artery without angina pectoris: Secondary | ICD-10-CM | POA: Diagnosis present

## 2020-03-30 DIAGNOSIS — Z6839 Body mass index (BMI) 39.0-39.9, adult: Secondary | ICD-10-CM

## 2020-03-30 DIAGNOSIS — Z20822 Contact with and (suspected) exposure to covid-19: Secondary | ICD-10-CM | POA: Diagnosis present

## 2020-03-30 DIAGNOSIS — Z96649 Presence of unspecified artificial hip joint: Secondary | ICD-10-CM

## 2020-03-30 DIAGNOSIS — M1611 Unilateral primary osteoarthritis, right hip: Principal | ICD-10-CM | POA: Diagnosis present

## 2020-03-30 DIAGNOSIS — Z955 Presence of coronary angioplasty implant and graft: Secondary | ICD-10-CM

## 2020-03-30 DIAGNOSIS — Z7982 Long term (current) use of aspirin: Secondary | ICD-10-CM

## 2020-03-30 DIAGNOSIS — Z96642 Presence of left artificial hip joint: Secondary | ICD-10-CM | POA: Diagnosis present

## 2020-03-30 DIAGNOSIS — Z7984 Long term (current) use of oral hypoglycemic drugs: Secondary | ICD-10-CM

## 2020-03-30 DIAGNOSIS — Z87891 Personal history of nicotine dependence: Secondary | ICD-10-CM | POA: Diagnosis not present

## 2020-03-30 DIAGNOSIS — I10 Essential (primary) hypertension: Secondary | ICD-10-CM | POA: Diagnosis present

## 2020-03-30 DIAGNOSIS — Z791 Long term (current) use of non-steroidal anti-inflammatories (NSAID): Secondary | ICD-10-CM

## 2020-03-30 HISTORY — PX: TOTAL HIP ARTHROPLASTY: SHX124

## 2020-03-30 LAB — GLUCOSE, CAPILLARY
Glucose-Capillary: 181 mg/dL — ABNORMAL HIGH (ref 70–99)
Glucose-Capillary: 226 mg/dL — ABNORMAL HIGH (ref 70–99)
Glucose-Capillary: 248 mg/dL — ABNORMAL HIGH (ref 70–99)

## 2020-03-30 SURGERY — ARTHROPLASTY, HIP, TOTAL,POSTERIOR APPROACH
Anesthesia: Spinal | Site: Hip | Laterality: Right

## 2020-03-30 MED ORDER — FENTANYL CITRATE (PF) 100 MCG/2ML IJ SOLN
25.0000 ug | INTRAMUSCULAR | Status: DC | PRN
  Administered 2020-03-30 (×3): 25 ug via INTRAVENOUS

## 2020-03-30 MED ORDER — MAGNESIUM HYDROXIDE 400 MG/5ML PO SUSP
30.0000 mL | Freq: Every day | ORAL | Status: DC
  Administered 2020-03-31: 30 mL via ORAL
  Filled 2020-03-30 (×2): qty 30

## 2020-03-30 MED ORDER — PROPOFOL 10 MG/ML IV BOLUS
INTRAVENOUS | Status: DC | PRN
  Administered 2020-03-30: 200 mg via INTRAVENOUS

## 2020-03-30 MED ORDER — ACETAMINOPHEN 10 MG/ML IV SOLN
INTRAVENOUS | Status: DC | PRN
  Administered 2020-03-30: 1000 mg via INTRAVENOUS

## 2020-03-30 MED ORDER — ATORVASTATIN CALCIUM 20 MG PO TABS
80.0000 mg | ORAL_TABLET | Freq: Every day | ORAL | Status: DC
  Administered 2020-03-31 – 2020-04-01 (×2): 80 mg via ORAL
  Filled 2020-03-30 (×2): qty 4

## 2020-03-30 MED ORDER — ALUM & MAG HYDROXIDE-SIMETH 200-200-20 MG/5ML PO SUSP: 30.0000 mL | ORAL | Status: DC | PRN

## 2020-03-30 MED ORDER — HYDROMORPHONE HCL 1 MG/ML IJ SOLN: 0.5000 mg | INTRAMUSCULAR | Status: DC | PRN

## 2020-03-30 MED ORDER — CEFAZOLIN SODIUM-DEXTROSE 2-4 GM/100ML-% IV SOLN
INTRAVENOUS | Status: AC
  Filled 2020-03-30: qty 100

## 2020-03-30 MED ORDER — SODIUM CHLORIDE 0.9 % IV SOLN: INTRAVENOUS | Status: DC

## 2020-03-30 MED ORDER — FLEET ENEMA 7-19 GM/118ML RE ENEM: 1.0000 | ENEMA | Freq: Once | RECTAL | Status: DC | PRN

## 2020-03-30 MED ORDER — METFORMIN HCL 500 MG PO TABS
1000.0000 mg | ORAL_TABLET | Freq: Two times a day (BID) | ORAL | Status: DC
  Administered 2020-03-31 – 2020-04-01 (×3): 1000 mg via ORAL
  Filled 2020-03-30 (×3): qty 2

## 2020-03-30 MED ORDER — PHENOL 1.4 % MT LIQD
1.0000 | OROMUCOSAL | Status: DC | PRN
  Filled 2020-03-30: qty 177

## 2020-03-30 MED ORDER — ACETAMINOPHEN 325 MG PO TABS: 325.0000 mg | ORAL_TABLET | Freq: Four times a day (QID) | ORAL | Status: DC | PRN

## 2020-03-30 MED ORDER — LIDOCAINE HCL (CARDIAC) PF 100 MG/5ML IV SOSY
PREFILLED_SYRINGE | INTRAVENOUS | Status: DC | PRN
  Administered 2020-03-30: 100 mg via INTRAVENOUS

## 2020-03-30 MED ORDER — BISACODYL 10 MG RE SUPP: 10.0000 mg | Freq: Every day | RECTAL | Status: DC | PRN

## 2020-03-30 MED ORDER — CEFAZOLIN SODIUM-DEXTROSE 2-4 GM/100ML-% IV SOLN
2.0000 g | INTRAVENOUS | Status: AC
  Administered 2020-03-30: 2 g via INTRAVENOUS

## 2020-03-30 MED ORDER — DEXAMETHASONE SODIUM PHOSPHATE 10 MG/ML IJ SOLN: 8.0000 mg | Freq: Once | INTRAMUSCULAR | Status: AC

## 2020-03-30 MED ORDER — ONDANSETRON HCL 4 MG/2ML IJ SOLN: 4.0000 mg | Freq: Four times a day (QID) | INTRAMUSCULAR | Status: DC | PRN

## 2020-03-30 MED ORDER — DEXAMETHASONE SODIUM PHOSPHATE 10 MG/ML IJ SOLN
INTRAMUSCULAR | Status: AC
  Administered 2020-03-30: 8 mg via INTRAVENOUS
  Filled 2020-03-30: qty 1

## 2020-03-30 MED ORDER — RISAQUAD PO CAPS
1.0000 | ORAL_CAPSULE | Freq: Every day | ORAL | Status: DC
  Administered 2020-03-31 – 2020-04-01 (×2): 1 via ORAL
  Filled 2020-03-30 (×3): qty 1

## 2020-03-30 MED ORDER — IRBESARTAN 150 MG PO TABS
75.0000 mg | ORAL_TABLET | Freq: Every day | ORAL | Status: DC
  Administered 2020-03-31 – 2020-04-01 (×2): 75 mg via ORAL
  Filled 2020-03-30 (×2): qty 1

## 2020-03-30 MED ORDER — CELECOXIB 200 MG PO CAPS
200.0000 mg | ORAL_CAPSULE | Freq: Two times a day (BID) | ORAL | Status: DC
  Administered 2020-03-30 – 2020-04-01 (×4): 200 mg via ORAL
  Filled 2020-03-30 (×4): qty 1

## 2020-03-30 MED ORDER — SUGAMMADEX SODIUM 200 MG/2ML IV SOLN
INTRAVENOUS | Status: DC | PRN
  Administered 2020-03-30: 245 mg via INTRAVENOUS

## 2020-03-30 MED ORDER — HYDROMORPHONE HCL 1 MG/ML IJ SOLN
0.2500 mg | INTRAMUSCULAR | Status: DC | PRN
  Administered 2020-03-30 (×2): 0.5 mg via INTRAVENOUS

## 2020-03-30 MED ORDER — OXYCODONE HCL 5 MG PO TABS: 5.0000 mg | ORAL_TABLET | ORAL | Status: DC | PRN

## 2020-03-30 MED ORDER — MENTHOL 3 MG MT LOZG
1.0000 | LOZENGE | OROMUCOSAL | Status: DC | PRN
  Filled 2020-03-30: qty 9

## 2020-03-30 MED ORDER — ACETAMINOPHEN 10 MG/ML IV SOLN
1000.0000 mg | Freq: Four times a day (QID) | INTRAVENOUS | Status: AC
  Administered 2020-03-30 – 2020-03-31 (×4): 1000 mg via INTRAVENOUS
  Filled 2020-03-30 (×4): qty 100

## 2020-03-30 MED ORDER — TRANEXAMIC ACID-NACL 1000-0.7 MG/100ML-% IV SOLN
INTRAVENOUS | Status: AC
  Filled 2020-03-30: qty 100

## 2020-03-30 MED ORDER — GABAPENTIN 300 MG PO CAPS
300.0000 mg | ORAL_CAPSULE | Freq: Every day | ORAL | Status: DC
  Administered 2020-03-30 – 2020-03-31 (×2): 300 mg via ORAL
  Filled 2020-03-30 (×2): qty 1

## 2020-03-30 MED ORDER — CELECOXIB 200 MG PO CAPS: 400.0000 mg | ORAL_CAPSULE | Freq: Once | ORAL | Status: DC

## 2020-03-30 MED ORDER — ONDANSETRON HCL 4 MG/2ML IJ SOLN
INTRAMUSCULAR | Status: DC | PRN
  Administered 2020-03-30 (×2): 4 mg via INTRAVENOUS

## 2020-03-30 MED ORDER — METOCLOPRAMIDE HCL 10 MG PO TABS
10.0000 mg | ORAL_TABLET | Freq: Three times a day (TID) | ORAL | Status: DC
  Administered 2020-03-30 – 2020-04-01 (×7): 10 mg via ORAL
  Filled 2020-03-30 (×7): qty 1

## 2020-03-30 MED ORDER — DIPHENHYDRAMINE HCL 12.5 MG/5ML PO ELIX: 12.5000 mg | ORAL_SOLUTION | ORAL | Status: DC | PRN

## 2020-03-30 MED ORDER — FENTANYL CITRATE (PF) 100 MCG/2ML IJ SOLN
INTRAMUSCULAR | Status: AC
  Filled 2020-03-30: qty 2

## 2020-03-30 MED ORDER — AMLODIPINE BESYLATE 5 MG PO TABS
5.0000 mg | ORAL_TABLET | Freq: Every day | ORAL | Status: DC
  Administered 2020-04-01: 5 mg via ORAL
  Filled 2020-03-30 (×2): qty 1

## 2020-03-30 MED ORDER — DEXAMETHASONE SODIUM PHOSPHATE 10 MG/ML IJ SOLN
INTRAMUSCULAR | Status: DC | PRN
  Administered 2020-03-30: 10 mg via INTRAVENOUS

## 2020-03-30 MED ORDER — FAMOTIDINE 20 MG PO TABS: 20.0000 mg | ORAL_TABLET | Freq: Once | ORAL | Status: AC

## 2020-03-30 MED ORDER — OXYCODONE HCL 5 MG PO TABS
10.0000 mg | ORAL_TABLET | ORAL | Status: DC | PRN
  Administered 2020-03-30 – 2020-04-01 (×8): 10 mg via ORAL
  Filled 2020-03-30 (×8): qty 2

## 2020-03-30 MED ORDER — ONDANSETRON HCL 4 MG/2ML IJ SOLN: 4.0000 mg | Freq: Once | INTRAMUSCULAR | Status: DC | PRN

## 2020-03-30 MED ORDER — FAMOTIDINE 20 MG PO TABS
ORAL_TABLET | ORAL | Status: AC
  Administered 2020-03-30: 20 mg via ORAL
  Filled 2020-03-30: qty 1

## 2020-03-30 MED ORDER — MIDAZOLAM HCL 5 MG/5ML IJ SOLN
INTRAMUSCULAR | Status: DC | PRN
  Administered 2020-03-30: 2 mg via INTRAVENOUS

## 2020-03-30 MED ORDER — TRANEXAMIC ACID-NACL 1000-0.7 MG/100ML-% IV SOLN: 1000.0000 mg | INTRAVENOUS | Status: DC

## 2020-03-30 MED ORDER — ENSURE PRE-SURGERY PO LIQD
296.0000 mL | Freq: Once | ORAL | Status: AC
  Administered 2020-03-30: 296 mL via ORAL
  Filled 2020-03-30: qty 296

## 2020-03-30 MED ORDER — ROCURONIUM BROMIDE 100 MG/10ML IV SOLN
INTRAVENOUS | Status: DC | PRN
  Administered 2020-03-30: 30 mg via INTRAVENOUS
  Administered 2020-03-30 (×2): 10 mg via INTRAVENOUS
  Administered 2020-03-30: 50 mg via INTRAVENOUS
  Administered 2020-03-30: 10 mg via INTRAVENOUS
  Administered 2020-03-30: 30 mg via INTRAVENOUS
  Administered 2020-03-30: 10 mg via INTRAVENOUS

## 2020-03-30 MED ORDER — CELECOXIB 200 MG PO CAPS
ORAL_CAPSULE | ORAL | Status: AC
  Administered 2020-03-30: 200 mg
  Filled 2020-03-30: qty 2

## 2020-03-30 MED ORDER — TRAMADOL HCL 50 MG PO TABS
50.0000 mg | ORAL_TABLET | ORAL | Status: DC | PRN
  Administered 2020-03-30 – 2020-03-31 (×3): 50 mg via ORAL
  Administered 2020-04-01: 100 mg via ORAL
  Filled 2020-03-30: qty 1
  Filled 2020-03-30: qty 2
  Filled 2020-03-30: qty 1
  Filled 2020-03-30: qty 2

## 2020-03-30 MED ORDER — METOCLOPRAMIDE HCL 5 MG/ML IJ SOLN: 5.0000 mg | Freq: Three times a day (TID) | INTRAMUSCULAR | Status: DC | PRN

## 2020-03-30 MED ORDER — METOCLOPRAMIDE HCL 10 MG PO TABS: 5.0000 mg | ORAL_TABLET | Freq: Three times a day (TID) | ORAL | Status: DC | PRN

## 2020-03-30 MED ORDER — GABAPENTIN 300 MG PO CAPS: 300.0000 mg | ORAL_CAPSULE | Freq: Once | ORAL | Status: AC

## 2020-03-30 MED ORDER — PANTOPRAZOLE SODIUM 40 MG PO TBEC
40.0000 mg | DELAYED_RELEASE_TABLET | Freq: Two times a day (BID) | ORAL | Status: DC
  Administered 2020-03-30 – 2020-04-01 (×4): 40 mg via ORAL
  Filled 2020-03-30 (×4): qty 1

## 2020-03-30 MED ORDER — MIDAZOLAM HCL 2 MG/2ML IJ SOLN
INTRAMUSCULAR | Status: AC
  Filled 2020-03-30: qty 2

## 2020-03-30 MED ORDER — CEFAZOLIN SODIUM-DEXTROSE 2-4 GM/100ML-% IV SOLN
2.0000 g | Freq: Four times a day (QID) | INTRAVENOUS | Status: AC
  Administered 2020-03-30 – 2020-03-31 (×4): 2 g via INTRAVENOUS
  Filled 2020-03-30 (×4): qty 100

## 2020-03-30 MED ORDER — FERROUS SULFATE 325 (65 FE) MG PO TABS
325.0000 mg | ORAL_TABLET | Freq: Two times a day (BID) | ORAL | Status: DC
  Administered 2020-03-31 – 2020-04-01 (×3): 325 mg via ORAL
  Filled 2020-03-30 (×3): qty 1

## 2020-03-30 MED ORDER — ACETAMINOPHEN 10 MG/ML IV SOLN
INTRAVENOUS | Status: AC
  Filled 2020-03-30: qty 100

## 2020-03-30 MED ORDER — HYDROMORPHONE HCL 1 MG/ML IJ SOLN
INTRAMUSCULAR | Status: AC
  Administered 2020-03-30: 0.5 mg via INTRAVENOUS
  Filled 2020-03-30: qty 1

## 2020-03-30 MED ORDER — SODIUM CHLORIDE 0.9 % IV SOLN
INTRAVENOUS | Status: DC | PRN
  Administered 2020-03-30: 25 ug/min via INTRAVENOUS

## 2020-03-30 MED ORDER — HYDROCHLOROTHIAZIDE 25 MG PO TABS
12.5000 mg | ORAL_TABLET | Freq: Every day | ORAL | Status: DC
  Administered 2020-03-31 – 2020-04-01 (×2): 12.5 mg via ORAL
  Filled 2020-03-30 (×2): qty 1

## 2020-03-30 MED ORDER — VITAMIN B-12 1000 MCG PO TABS
5000.0000 ug | ORAL_TABLET | Freq: Two times a day (BID) | ORAL | Status: DC
  Administered 2020-03-30 – 2020-04-01 (×4): 5000 ug via ORAL
  Filled 2020-03-30 (×4): qty 5

## 2020-03-30 MED ORDER — GABAPENTIN 300 MG PO CAPS
ORAL_CAPSULE | ORAL | Status: AC
  Administered 2020-03-30: 300 mg via ORAL
  Filled 2020-03-30: qty 1

## 2020-03-30 MED ORDER — ONDANSETRON HCL 4 MG PO TABS: 4.0000 mg | ORAL_TABLET | Freq: Four times a day (QID) | ORAL | Status: DC | PRN

## 2020-03-30 MED ORDER — ENOXAPARIN SODIUM 30 MG/0.3ML ~~LOC~~ SOLN
30.0000 mg | Freq: Two times a day (BID) | SUBCUTANEOUS | Status: DC
  Administered 2020-03-31 – 2020-04-01 (×3): 30 mg via SUBCUTANEOUS
  Filled 2020-03-30 (×3): qty 0.3

## 2020-03-30 MED ORDER — CARVEDILOL 3.125 MG PO TABS
6.2500 mg | ORAL_TABLET | Freq: Two times a day (BID) | ORAL | Status: DC
  Administered 2020-03-31 – 2020-04-01 (×2): 6.25 mg via ORAL
  Filled 2020-03-30 (×2): qty 2

## 2020-03-30 MED ORDER — HYDROMORPHONE HCL 1 MG/ML IJ SOLN
INTRAMUSCULAR | Status: AC
  Filled 2020-03-30: qty 1

## 2020-03-30 MED ORDER — HYDROMORPHONE HCL 1 MG/ML IJ SOLN
INTRAMUSCULAR | Status: DC | PRN
  Administered 2020-03-30: 1 mg via INTRAVENOUS

## 2020-03-30 MED ORDER — GLYCOPYRROLATE 0.2 MG/ML IJ SOLN
INTRAMUSCULAR | Status: DC | PRN
  Administered 2020-03-30: .2 mg via INTRAVENOUS

## 2020-03-30 MED ORDER — NEOMYCIN-POLYMYXIN B GU 40-200000 IR SOLN
Status: DC | PRN
  Administered 2020-03-30: 14 mL

## 2020-03-30 MED ORDER — TRANEXAMIC ACID-NACL 1000-0.7 MG/100ML-% IV SOLN
1000.0000 mg | Freq: Once | INTRAVENOUS | Status: AC
  Administered 2020-03-30: 1000 mg via INTRAVENOUS

## 2020-03-30 MED ORDER — SUCCINYLCHOLINE CHLORIDE 20 MG/ML IJ SOLN
INTRAMUSCULAR | Status: DC | PRN
  Administered 2020-03-30: 120 mg via INTRAVENOUS

## 2020-03-30 MED ORDER — FENTANYL CITRATE (PF) 100 MCG/2ML IJ SOLN
INTRAMUSCULAR | Status: AC
  Administered 2020-03-30: 25 ug via INTRAVENOUS
  Filled 2020-03-30: qty 2

## 2020-03-30 MED ORDER — CHLORHEXIDINE GLUCONATE 4 % EX LIQD
60.0000 mL | Freq: Once | CUTANEOUS | Status: AC
  Administered 2020-03-30: 4 via TOPICAL

## 2020-03-30 MED ORDER — INSULIN ASPART 100 UNIT/ML ~~LOC~~ SOLN
0.0000 [IU] | Freq: Three times a day (TID) | SUBCUTANEOUS | Status: DC
  Administered 2020-03-31: 10 [IU] via SUBCUTANEOUS
  Administered 2020-03-31: 5 [IU] via SUBCUTANEOUS
  Administered 2020-04-01: 2 [IU] via SUBCUTANEOUS
  Administered 2020-04-01: 3 [IU] via SUBCUTANEOUS
  Filled 2020-03-30 (×5): qty 1

## 2020-03-30 MED ORDER — NITROGLYCERIN 0.4 MG SL SUBL: 0.4000 mg | SUBLINGUAL_TABLET | SUBLINGUAL | Status: DC | PRN

## 2020-03-30 MED ORDER — SENNOSIDES-DOCUSATE SODIUM 8.6-50 MG PO TABS
1.0000 | ORAL_TABLET | Freq: Two times a day (BID) | ORAL | Status: DC
  Administered 2020-03-30 – 2020-04-01 (×4): 1 via ORAL
  Filled 2020-03-30 (×4): qty 1

## 2020-03-30 MED ORDER — FENTANYL CITRATE (PF) 100 MCG/2ML IJ SOLN
INTRAMUSCULAR | Status: DC | PRN
  Administered 2020-03-30 (×2): 50 ug via INTRAVENOUS
  Administered 2020-03-30: 100 ug via INTRAVENOUS

## 2020-03-30 SURGICAL SUPPLY — 61 items
BLADE DRUM FLTD (BLADE) ×3 IMPLANT
BLADE SAW 90X25X1.19 OSCILLAT (BLADE) ×3 IMPLANT
BNDG COHESIVE 6X5 TAN STRL LF (GAUZE/BANDAGES/DRESSINGS) ×2 IMPLANT
CANISTER SUCT 1200ML W/VALVE (MISCELLANEOUS) ×3 IMPLANT
CANISTER SUCT 3000ML PPV (MISCELLANEOUS) ×6 IMPLANT
CARTRIDGE OIL MAESTRO DRILL (MISCELLANEOUS) ×1 IMPLANT
COVER WAND RF STERILE (DRAPES) ×3 IMPLANT
CUP ACETBLR 54 OD 100 SERIES (Hips) ×2 IMPLANT
DIFFUSER DRILL AIR PNEUMATIC (MISCELLANEOUS) ×3 IMPLANT
DRAPE 3/4 80X56 (DRAPES) ×3 IMPLANT
DRAPE INCISE IOBAN 66X60 STRL (DRAPES) ×3 IMPLANT
DRSG DERMACEA 8X12 NADH (GAUZE/BANDAGES/DRESSINGS) ×3 IMPLANT
DRSG OPSITE POSTOP 4X12 (GAUZE/BANDAGES/DRESSINGS) ×3 IMPLANT
DRSG OPSITE POSTOP 4X14 (GAUZE/BANDAGES/DRESSINGS) ×2 IMPLANT
DRSG TEGADERM 4X4.75 (GAUZE/BANDAGES/DRESSINGS) ×3 IMPLANT
DURAPREP 26ML APPLICATOR (WOUND CARE) ×3 IMPLANT
ELECT REM PT RETURN 9FT ADLT (ELECTROSURGICAL) ×3
ELECTRODE REM PT RTRN 9FT ADLT (ELECTROSURGICAL) ×1 IMPLANT
GLOVE BIO SURGEON STRL SZ7.5 (GLOVE) ×6 IMPLANT
GLOVE BIOGEL M STRL SZ7.5 (GLOVE) ×6 IMPLANT
GLOVE BIOGEL PI IND STRL 7.5 (GLOVE) ×1 IMPLANT
GLOVE BIOGEL PI INDICATOR 7.5 (GLOVE) ×2
GLOVE INDICATOR 8.0 STRL GRN (GLOVE) ×3 IMPLANT
GOWN STRL REUS W/ TWL LRG LVL3 (GOWN DISPOSABLE) ×2 IMPLANT
GOWN STRL REUS W/ TWL XL LVL3 (GOWN DISPOSABLE) ×1 IMPLANT
GOWN STRL REUS W/TWL LRG LVL3 (GOWN DISPOSABLE) ×18
GOWN STRL REUS W/TWL XL LVL3 (GOWN DISPOSABLE) ×3
HEAD M SROM 36MM PLUS 1.5 (Hips) IMPLANT
HEMOVAC 400CC 10FR (MISCELLANEOUS) ×3 IMPLANT
HOLDER FOLEY CATH W/STRAP (MISCELLANEOUS) ×3 IMPLANT
HOOD PEEL AWAY FLYTE STAYCOOL (MISCELLANEOUS) ×6 IMPLANT
KIT TURNOVER KIT A (KITS) ×3 IMPLANT
LINER NEUTRAL 36ID 54OD (Liner) ×2 IMPLANT
MANIFOLD NEPTUNE II (INSTRUMENTS) ×3 IMPLANT
NDL SAFETY ECLIPSE 18X1.5 (NEEDLE) ×1 IMPLANT
NEEDLE HYPO 18GX1.5 SHARP (NEEDLE) ×3
NS IRRIG 500ML POUR BTL (IV SOLUTION) ×3 IMPLANT
OIL CARTRIDGE MAESTRO DRILL (MISCELLANEOUS) ×3
PACK HIP PROSTHESIS (MISCELLANEOUS) ×3 IMPLANT
PENCIL SMOKE ULTRAEVAC 22 CON (MISCELLANEOUS) ×3 IMPLANT
PIN STEIN THRED 5/32 (Pin) ×3 IMPLANT
PULSAVAC PLUS IRRIG FAN TIP (DISPOSABLE) ×3
SOL .9 NS 3000ML IRR  AL (IV SOLUTION) ×3
SOL .9 NS 3000ML IRR UROMATIC (IV SOLUTION) ×1 IMPLANT
SOL PREP PVP 2OZ (MISCELLANEOUS) ×3
SOLUTION PREP PVP 2OZ (MISCELLANEOUS) ×1 IMPLANT
SPONGE DRAIN TRACH 4X4 STRL 2S (GAUZE/BANDAGES/DRESSINGS) ×3 IMPLANT
SROM M HEAD 36MM PLUS 1.5 (Hips) ×3 IMPLANT
STAPLER SKIN PROX 35W (STAPLE) ×3 IMPLANT
STEM FEM CMNTLSS SM AML 13.5 (Hips) ×2 IMPLANT
SUT ETHIBOND #5 BRAIDED 30INL (SUTURE) ×3 IMPLANT
SUT VIC AB 0 CT1 36 (SUTURE) ×3 IMPLANT
SUT VIC AB 1 CT1 36 (SUTURE) ×6 IMPLANT
SUT VIC AB 2-0 CT1 27 (SUTURE) ×3
SUT VIC AB 2-0 CT1 TAPERPNT 27 (SUTURE) ×1 IMPLANT
SYR 20ML LL LF (SYRINGE) ×3 IMPLANT
TAPE CLOTH 3X10 WHT NS LF (GAUZE/BANDAGES/DRESSINGS) ×3 IMPLANT
TAPE TRANSPORE STRL 2 31045 (GAUZE/BANDAGES/DRESSINGS) ×3 IMPLANT
TIP FAN IRRIG PULSAVAC PLUS (DISPOSABLE) ×1 IMPLANT
TOWEL OR 17X26 4PK STRL BLUE (TOWEL DISPOSABLE) ×3 IMPLANT
TRAY FOLEY MTR SLVR 16FR STAT (SET/KITS/TRAYS/PACK) ×3 IMPLANT

## 2020-03-30 NOTE — Transfer of Care (Signed)
Immediate Anesthesia Transfer of Care Note  Patient: Daniel [name]  Procedure(s) Performed: TOTAL HIP ARTHROPLASTY (Right Hip)  Patient Location: PACU  Anesthesia Type:General  Level of Consciousness: awake, alert  and oriented  Airway & Oxygen Therapy: Patient Spontanous Breathing and Patient connected to face mask oxygen  Post-op Assessment: Report given to RN and Post -op Vital signs reviewed and stable  Post vital signs: Reviewed and stable  Last Vitals:  Vitals Value Taken Time  BP    Temp    Pulse    Resp    SpO2      Last Pain:  Vitals:   03/30/20 1005  TempSrc: Tympanic  PainSc: 0-No pain      Patients Stated Pain Goal: 0 (03/30/20 1005)  Complications: No apparent anesthesia complications

## 2020-03-30 NOTE — Anesthesia Postprocedure Evaluation (Signed)
Anesthesia Post Note  Patient: Daniel [name]  Procedure(s) Performed: TOTAL HIP ARTHROPLASTY (Right Hip)  Patient location during evaluation: PACU Anesthesia Type: General Level of consciousness: awake and alert and oriented Pain management: pain level controlled Vital Signs Assessment: post-procedure vital signs reviewed and stable Respiratory status: spontaneous breathing Cardiovascular status: blood pressure returned to baseline Anesthetic complications: no     Last Vitals:  Vitals:   03/30/20 1655 03/30/20 1713  BP: (!) 159/91 (!) 149/84  Pulse: (!) 57 (!) 50  Resp: 12 17  Temp:    SpO2: 100% 100%    Last Pain:  Vitals:   03/30/20 1713  TempSrc:   PainSc: 5                  ,

## 2020-03-30 NOTE — Op Note (Signed)
OPERATIVE NOTE  DATE OF SURGERY:  03/30/2020  PATIENT NAME:  Daniel [name]   DOB: [birth year]  MRN: [id number]  PRE-OPERATIVE DIAGNOSIS: Degenerative arthrosis of the right hip, primary  POST-OPERATIVE DIAGNOSIS:  Same  PROCEDURE:  Right total hip arthroplasty  SURGEON:  Jena Gauss. M.D.  ASSISTANT: Baldwin Jamaica, PA-C (present and scrubbed throughout the case, critical for assistance with exposure, retraction, instrumentation, and closure)  ANESTHESIA: general  ESTIMATED BLOOD LOSS: 500 mL  FLUIDS REPLACED: 1300 mL of crystalloid  DRAINS: 2 medium drains to a Hemovac reservoir  IMPLANTS UTILIZED: DePuy 13.5 mm small stature AML femoral stem, 54 mm OD Pinnacle 100 acetabular component, neutral Pinnacle Marathon polyethylene insert, and a 36 mm M-SPEC +1.5 mm hip ball  INDICATIONS FOR SURGERY: [name] is a 54 y.o. year old male with a long history of progressive hip and groin  pain. X-rays demonstrated severe degenerative changes. The patient had not seen any significant improvement despite conservative nonsurgical intervention. After discussion of the risks and benefits of surgical intervention, the patient expressed understanding of the risks benefits and agree with plans for total hip arthroplasty.   The risks, benefits, and alternatives were discussed at length including but not limited to the risks of infection, bleeding, nerve injury, stiffness, blood clots, the need for revision surgery, limb length inequality, dislocation, cardiopulmonary complications, among others, and they were willing to proceed.  PROCEDURE IN DETAIL: The patient was brought into the operating room and, after adequate general anesthesia was achieved, the patient was placed in a left lateral decubitus position. Axillary roll was placed and all bony prominences were well-padded. The patient's right hip was cleaned and prepped with alcohol and DuraPrep and draped in the usual sterile fashion. A  "timeout" was performed as per usual protocol. A lateral curvilinear incision was made gently curving towards the posterior superior iliac spine. The IT band was incised in line with the skin incision and the fibers of the gluteus maximus were split in line. The piriformis tendon was identified, skeletonized, and incised at its insertion to the proximal femur and reflected posteriorly. A T type posterior capsulotomy was performed. Prior to dislocation of the femoral head, a threaded Steinmann pin was inserted through a separate stab incision into the pelvis superior to the acetabulum and bent in the form of a stylus so as to assess limb length and hip offset throughout the procedure. The femoral head was then dislocated posteriorly. Inspection of the femoral head demonstrated severe degenerative changes with full-thickness loss of articular cartilage.  The gluteal sling was incised so as to better mobilize the proximal femur.  The femoral neck cut was performed using an oscillating saw. The anterior capsule was elevated off of the femoral neck using a periosteal elevator. Attention was then directed to the acetabulum. The remnant of the labrum was excised using electrocautery. Inspection of the acetabulum also demonstrated significant degenerative changes. The acetabulum was reamed in sequential fashion up to a 53 mm diameter. Good punctate bleeding bone was encountered. A 54 mm Pinnacle 100 acetabular component was positioned and impacted into place. Good scratch fit was appreciated. A neutral polyethylene trial was inserted.  Attention was then directed to the proximal femur. A hole for reaming of the proximal femoral canal was created using a high-speed burr. The femoral canal was reamed in sequential fashion up to a 13 mm diameter. This allowed for approximately 6 cm of scratch fit.  It was thus elected to ream up to  a 13.5 mm diameter to allow for a line to line fit.  Serial broaches were inserted up to a  13.5 mm small stature femoral broach. Calcar region was planed and a trial reduction was performed using a 36 mm hip ball with a +1.5 mm neck length. Good equalization of limb lengths and hip offset was appreciated and excellent stability was noted both anteriorly and posteriorly. Trial components were removed. The acetabular shell was irrigated with copious amounts of normal saline with antibiotic solution and suctioned dry. A neutral Pinnacle Marathon polyethylene insert was positioned and impacted into place. Next, a 13.5 mm small stature AML femoral stem was positioned and impacted into place. Excellent scratch fit was appreciated. A trial reduction was again performed with a 36 mm hip ball with a +1.5 mm neck length. Again, good equalization of limb lengths was appreciated and excellent stability appreciated both anteriorly and posteriorly. The hip was then dislocated and the trial hip ball was removed. The Morse taper was cleaned and dried. A 36 mm M-SPEC hip ball with a +1.5 mm neck length was placed on the trunnion and impacted into place. The hip was then reduced and placed through range of motion. Excellent stability was appreciated both anteriorly and posteriorly.  The wound was irrigated with copious amounts of normal saline with antibiotic solution and suctioned dry. Good hemostasis was appreciated. The posterior capsulotomy was repaired using #5 Ethibond. Piriformis tendon was reapproximated to the undersurface of the gluteus medius tendon using #5 Ethibond. Two medium drains were placed in the wound bed and brought out through separate stab incisions to be attached to a Hemovac reservoir. The IT band was reapproximated using interrupted sutures of #1 Vicryl. Subcutaneous tissue was approximated using first #0 Vicryl followed by #2-0 Vicryl. The skin was closed with skin staples.  The patient tolerated the procedure well and was transported to the recovery room in stable condition.   Marciano Sequin., M.D.

## 2020-03-30 NOTE — Anesthesia Preprocedure Evaluation (Signed)
Anesthesia Evaluation  Patient identified by MRN, date of birth, ID band Patient awake    Reviewed: Allergy & Precautions, H&P , NPO status , Patient's Chart, lab work & pertinent test results  History of Anesthesia Complications Negative for: history of anesthetic complications  Airway Mallampati: III  TM Distance: >3 FB Neck ROM: full    Dental  (+) Chipped   Pulmonary former smoker,           Cardiovascular hypertension, (-) angina+ CAD, + Past MI and + Cardiac Stents (3 years ago)  (-) dysrhythmias      Neuro/Psych CVA (cannot smell or taste.  No weakness, numbness or paresthesias), Residual Symptoms negative psych ROS   GI/Hepatic GERD  Controlled,(+)     substance abuse  alcohol use,   Endo/Other  diabetes, Type 2, Oral Hypoglycemic AgentsMorbid obesity  Renal/GU      Musculoskeletal   Abdominal   Peds  Hematology negative hematology ROS (+)   Anesthesia Other Findings Past Medical History: No date: CAD (coronary artery disease)     Comment:  a. NSTEMI: 100% stenosis of the distal RCA --> DES               placed 5/17 CLEARED BY CARDIOLOGIST No date: Chronic low back pain     Comment:  a. s/p back surgery. No date: CVA (cerebral infarction)     Comment:  a. 02/2015 dyarthria 2/2 CVA involving the lateral aspect              of the precentral gyrus. No date: DM2 (diabetes mellitus, type 2) (HCC)     Comment:  a. pre-diabetic in the past. b. A1c 03/2016 elevated to               7.9. No date: ETOH abuse No date: GERD (gastroesophageal reflux disease) No date: History of echocardiogram     Comment:  a. Mild LVH, EF 55-60%, Definity contrast used-LV wall               motion could not be adequately assessed 03/2016: History of non-ST elevation myocardial infarction (NSTEMI)     Comment:  a. PCI: 3.5 x 24 mm Promus Premier DES to distal RCA No date: History of tobacco abuse No date: Hypertension  Comment:  a. 02/2015 echo: 55-65%, trace TR/MR. b. 03/2016: echo               with EF of 55-60%.  No date: Myocardial infarction [facility]) No date: Obesity No date: Ruptured disk No date: Stroke [facility])     Comment:  2016  Past Surgical History: No date: BACK SURGERY     Comment:  ruptured disk, L-S 05/2004: CARDIAC CATHETERIZATION     Comment:  minimal CAD 03/10/2016: CARDIAC CATHETERIZATION; N/A     Comment:  Procedure: Left Heart Cath and Coronary Angiography;                Surgeon: Kathleene Hazel, MD;  Location: [facility]               INVASIVE CV LAB;  Service: Cardiovascular;  Laterality:               N/A; 03/10/2016: CARDIAC CATHETERIZATION; N/A     Comment:  Procedure: Coronary Stent Intervention;  Surgeon:               Kathleene Hazel, MD;  Location: [facility] INVASIVE CV  LAB;  Service: Cardiovascular;  Laterality: N/A; 07/12/2016: CATARACT EXTRACTION W/PHACO; Left     Comment:  Procedure: CATARACT EXTRACTION PHACO AND INTRAOCULAR               LENS PLACEMENT (IOC);  Surgeon: Eulogio Bear, MD;                Location: [facility] ORS;  Service: Ophthalmology;  Laterality:              Left;  [place]  00:50 AP% 9.2 CDE 4.63 fluid pack lot #               [id number] No date: CORONARY ANGIOPLASTY     Comment:  STENT 5/17  BMI    Body Mass Index: 39.22 kg/m      Reproductive/Obstetrics negative OB ROS                             Anesthesia Physical  Anesthesia Plan  ASA: III  Anesthesia Plan: Spinal   Post-op Pain Management:    Induction:   PONV Risk Score and Plan: Propofol infusion  Airway Management Planned: Natural Airway, Simple Face Mask and Nasal Cannula  Additional Equipment:   Intra-op Plan:   Post-operative Plan:   Informed Consent: I have reviewed the patients History and Physical, chart, labs and discussed the procedure including the risks, benefits and alternatives for the proposed anesthesia with the patient or  authorized representative who has indicated his/her understanding and acceptance.     Dental Advisory Given  Plan Discussed with: Anesthesiologist and CRNA  Anesthesia Plan Comments:         Anesthesia Quick Evaluation

## 2020-03-30 NOTE — H&P (Signed)
The patient has been re-examined, and the chart reviewed, and there have been no interval changes to the documented history and physical.    The risks, benefits, and alternatives have been discussed at length. The patient expressed understanding of the risks benefits and agreed with plans for surgical intervention.   P. , Jr. M.D.    

## 2020-03-31 LAB — GLUCOSE, CAPILLARY
Glucose-Capillary: 246 mg/dL — ABNORMAL HIGH (ref 70–99)
Glucose-Capillary: 223 mg/dL — ABNORMAL HIGH (ref 70–99)
Glucose-Capillary: 253 mg/dL — ABNORMAL HIGH (ref 70–99)
Glucose-Capillary: 213 mg/dL — ABNORMAL HIGH (ref 70–99)

## 2020-03-31 NOTE — TOC Initial Note (Signed)
Transition of Care [facility]) - Initial/Assessment Note    Patient Details  Name: Daniel [name] MRN: [id number] Date of Birth: 1966/07/14  Transition of Care [facility]) CM/SW Contact:    Su Hilt, RN Phone Number: 03/31/2020, 10:58 AM  Clinical Narrative:                 Met with the patient in the room to dsicuss DC plan and needs, He lives at home with his spouse and drives but wife will provide transportation if needed, he is up to date with PCP and can afford his medications He has a rolling walker and a 3 in 1 at home and does not need additional DME, He is [facility] with [facility] No additional needs  Expected Discharge Plan: [place] Barriers to Discharge: Continued Medical Work up   Patient Goals and CMS Choice Patient states their goals for this hospitalization and ongoing recovery are:: go home as soon as possible      Expected Discharge Plan and Services Expected Discharge Plan: [place]   Discharge Planning Services: CM Consult   Living arrangements for the past 2 months: Single Family Home                 DME Arranged: N/A         HH Arranged: PT HH Agency: [facility] (formerly [employer]) Date [place]: 03/31/20 Time [place]: [place] Representative spoke with at [place]: Helene Kelp  Prior Living Arrangements/Services Living arrangements for the past 2 months: [place] Lives with:: Spouse Patient language and need for interpreter reviewed:: Yes Do you feel safe going back to the place where you live?: Yes      Need for Family Participation in Patient Care: No (Comment) Care giver support system in place?: Yes (comment) Current home services: DME(RW and 3 in 1) Criminal Activity/Legal Involvement Pertinent to Current Situation/Hospitalization: No - Comment as needed  Activities of Daily Living Home Assistive Devices/Equipment: CBG Meter ADL Screening  (condition at time of admission) Patient's cognitive ability adequate to safely complete daily activities?: Yes Is the patient deaf or have difficulty hearing?: No Does the patient have difficulty seeing, even when wearing glasses/contacts?: No Does the patient have difficulty concentrating, remembering, or making decisions?: No Patient able to express need for assistance with ADLs?: Yes Does the patient have difficulty dressing or bathing?: No Independently performs ADLs?: Yes (appropriate for developmental age) Does the patient have difficulty walking or climbing stairs?: Yes Weakness of Legs: None Weakness of Arms/Hands: None  Permission Sought/Granted   Permission granted to share information with : Yes, Verbal Permission Granted              Emotional Assessment Appearance:: Appears stated age Attitude/Demeanor/Rapport: Engaged Affect (typically observed): Pleasant, Appropriate Orientation: : Oriented to Situation, Oriented to  Time, Oriented to Place, Oriented to Self Alcohol / Substance Use: Not Applicable Psych Involvement: No (comment)  Admission diagnosis:  H/O total hip arthroplasty [Z96.649] Patient Active Problem List   Diagnosis Date Noted  . H/O total hip arthroplasty 03/30/2020  . Status post total replacement of left hip 06/03/2019  . Thrombocytopenia ([place]) 12/31/2018  . Left hip pain 12/15/2018  . Type 2 diabetes mellitus with hyperglycemia, without long-term current use of insulin ([place]) 07/22/2017  . Coronary artery disease involving native coronary artery of native heart without angina pectoris 03/20/2016  . History of non-ST elevation  myocardial infarction (NSTEMI) 03/20/2016  . Hyperlipidemia associated with type 2 diabetes mellitus ([place]) 03/12/2016  . NSTEMI (non-ST elevated myocardial infarction) ([place]) 03/10/2016  . Prostate cancer screening 02/10/2016  . H/O: CVA (cerebrovascular accident) 03/01/2015  . Cerebral infarction ([place]) 03/01/2015  . Right  knee pain 04/06/2014  . Benign paroxysmal positional vertigo 03/10/2014  . BPH (benign prostatic hyperplasia) 11/16/2013  . Routine general medical examination at a health care facility 11/12/2013  . Morbid obesity ([place]) 03/02/2011  . B12 deficiency 09/29/2007  . ERECTILE DYSFUNCTION 07/01/2007  . History of tobacco abuse 07/01/2007  . LOW BACK PAIN, CHRONIC 07/01/2007  . HYPERTENSION, BENIGN ESSENTIAL 06/03/2007   PCP:  Abner Greenspan, MD Pharmacy:   [id number] [place] [address] [address]Phone: [phone]: [phone]    Social Determinants of Health (SDOH) Interventions    Readmission Risk Interventions No flowsheet data found.

## 2020-03-31 NOTE — Progress Notes (Signed)
  Subjective: 1 Day Post-Op Procedure(s) (LRB): TOTAL HIP ARTHROPLASTY (Right) Patient reports pain as well-controlled.   Patient is well, and has had no acute complaints or problems Plan is to go Home after hospital stay. Negative for chest pain and shortness of breath Fever: no Gastrointestinal: negative for nausea and vomiting.   Patient has not had a bowel movement.  Objective: Vital signs in last 24 hours: Temp:  [97.3 F (36.3 C)-98.7 F (37.1 C)] 97.7 F (36.5 C) (05/27 0742) Pulse Rate:  [50-73] 54 (05/27 0742) Resp:  [8-20] 15 (05/27 0742) BP: (109-161)/(57-95) 109/57 (05/27 0742) SpO2:  [92 %-100 %] 97 % (05/27 0413) Weight:  [122.5 kg] 122.5 kg (05/26 1005)  Intake/Output from previous day:  Intake/Output Summary (Last 24 hours) at 03/31/2020 0944 Last data filed at 03/31/2020 9675 Gross per 24 hour  Intake 3131.25 ml  Output 2510 ml  Net 621.25 ml    Intake/Output this shift: Total I/O In: -  Out: 500 [Urine:500]  Labs: No results for input(s): HGB in the last 72 hours. No results for input(s): WBC, RBC, HCT, PLT in the last 72 hours. No results for input(s): NA, K, CL, CO2, BUN, CREATININE, GLUCOSE, CALCIUM in the last 72 hours. No results for input(s): LABPT, INR in the last 72 hours.   EXAM General - Patient is Alert, Appropriate and Oriented Extremity - Neurovascular intact Dorsiflexion/Plantar flexion intact Compartment soft Dressing/Incision -clean, dry, no drainage, Hemovac in place.  Motor Function - intact, moving foot and toes well on exam.  Cardiovascular- Regular rate and rhythm, no murmurs/rubs/gallops Respiratory- Lungs clear to auscultation bilaterally Gastrointestinal- soft, nontender and active bowel sounds   Assessment/Plan: 1 Day Post-Op Procedure(s) (LRB): TOTAL HIP ARTHROPLASTY (Right) Active Problems:   H/O total hip arthroplasty  Estimated body mass index is 38.74 kg/m as calculated from the following:   Height as of  this encounter: 5\' 10"  (1.778 m).   Weight as of this encounter: 122.5 kg. Advance diet Up with therapy Plan for discharge tomorrow    DVT Prophylaxis - Lovenox, Ted hose and foot pumps Weight-Bearing as tolerated to right leg  , PA-C [facility] 03/31/2020, 9:44 AM

## 2020-03-31 NOTE — Evaluation (Signed)
Occupational Therapy Evaluation Patient Details Name: Daniel [name] MRN: [id number] DOB: 1966/04/23 Today's Date: 03/31/2020    History of Present Illness Pt is a 54 y.o. 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  PMH includes L THA 06/03/2019, DM, BPH, h/o alcohol abuse, h/o stroke 02/2015, htn, h/o back surgery, coronary angioplasty with stent placement, NSTEMI.   Clinical Impression   Daniel [name] presents this morning awake and alert in bed, agreeable to evaluation. He lives with his wife in a 1 level home with 3 stairs to enter, b/l rails, a BSC over toilet and various DME from prior L THA (2020), including 2 lift recliners for sleeping. He works for a [employer] (33+ years) and enjoys motorcycles, hunting and fishing. Prior to surgery, he was independent in mobility, ADL and IADL mgt. He did have 1 fall 2 months ago when his dog tripped him.   Pt completed bed mobility with Mod I, using increased time and bed rails for heavy UE assistance. He sat EOB and completed UB bathing with Setup A, standing ~30 seconds with no support or noted LOB. He demonstrates good functional strength, independent adherence to posterior THP and safety awareness throughout session. Pt educated on posterior hip precautions, compression stocking mgt, safe AE/DME use for ADL and home setup. He was receptive and verbalized understanding; handout provided for carryover. Given pt functional status, home setup and support (wife and prior L THA), no further acute or follow-up OT needs anticipated at this time.      Follow Up Recommendations  No OT follow up    Equipment Recommendations  None recommended by OT    Recommendations for Other Services       Precautions / Restrictions Precautions Precautions: Posterior Hip;Fall Precaution Booklet Issued: Yes (comment) Restrictions Weight Bearing Restrictions: Yes RLE Weight Bearing: Weight bearing as tolerated       Mobility Bed Mobility Overal bed mobility: Needs Assistance Bed Mobility: Supine to Sit;Sit to Supine     Supine to sit: Modified independent (Device/Increase time);HOB elevated Sit to supine: Modified independent (Device/Increase time);HOB elevated   General bed mobility comments: required increased time and reliance on multiple bed rails for positioning and support  Transfers Overall transfer level: Needs assistance Transfers: Sit to/from Stand Sit to Stand: Supervision         General transfer comment: Pt stood ~30 seconds independently during UB bathing without assistive device with no noted unsteadiness. Still recommend use of RW for additional support.    Balance Overall balance assessment: Needs assistance Sitting-balance support: No upper extremity supported;Feet supported Sitting balance-Leahy Scale: Good Sitting balance - Comments: steady sitting while reaching outside BOS during UB bathing, able to weight shift with some limitation 2/2 pain   Standing balance support: No upper extremity supported;During functional activity Standing balance-Leahy Scale: Good Standing balance comment: steady standing during UB bathing                           ADL either performed or assessed with clinical judgement   ADL Overall ADL's : Needs assistance/impaired         Upper Body Bathing: Bed level;Sitting;Set up Upper Body Bathing Details (indicate cue type and reason): Pt sat EOB and completed UB sponge bath with Setup A and supervision for safety. No cueing or physical assist needed. Lower Body Bathing: Moderate assistance;Sit to/from stand;Adhering to hip precautions Lower Body Bathing Details (indicate cue type and reason):  Per clinical obs and pt sit <> stand with supervision     Lower Body Dressing: With adaptive equipment;Set up;Cueing for sequencing(AE and RW) Lower Body Dressing Details (indicate cue type and reason): Per clinical obs, no physical  assist needed for transfers, pt verbalized understanding of AE use for LB dressing following demonstration             Functional mobility during ADLs: (mobility deferred this session) General ADL Comments: Pt verbalized understanding of adaptive ADL strategies and named 3/3 posterior THP independently, had L hip surgery last year (2020). Verbalized plan for dressing, bathing and sleeping at home. Pt WFL to complete ADL safely with home setup.     Vision Baseline Vision/History: No visual deficits       Perception     Praxis      Pertinent Vitals/Pain Pain Assessment: 0-10 Pain Score: 8  Pain Location: R hip Pain Descriptors / Indicators: Grimacing;Operative site guarding Pain Intervention(s): Limited activity within patient's tolerance;Monitored during session;Repositioned;Premedicated before session;Ice applied     Hand Dominance     Extremity/Trunk Assessment     Lower Extremity Assessment RLE Deficits / Details: Anticipated deficits in RLE strength/ROM       Communication Communication Communication: No difficulties   Cognition Arousal/Alertness: Awake/alert Behavior During Therapy: WFL for tasks assessed/performed Overall Cognitive Status: Within Functional Limits for tasks assessed                                     General Comments  R hip hemovac in place beginning/end of session    Exercises Other Exercises: Pt educated on posterior hip precautions, compression stocking mgt, safe AE/DME use for ADL/IADL, falls prevention strategies and home setup. He was receptive and verbalized understanding of all education; handout provided for reinforcement.   Shoulder Instructions      Home Living Family/patient expects to be discharged to:: Private residence Living Arrangements: Spouse/significant other Available Help at Discharge: Family;Available 24 hours/day Type of Home: House Home Access: Stairs to enter [employer] of Steps:  3 Entrance Stairs-Rails: Right;Left;Can reach both Home Layout: One level     Bathroom Shower/Tub: Occupational psychologist: (BSC over toilet)     Home Equipment: Walker - 2 wheels;Bedside commode;Other (comment)(uses BSC as shower chair; 2 lift recliners)   Additional Comments: pt also has sock aid and basket for his walker from prior surgery      Prior Functioning/Environment Level of Independence: Independent        Comments: 1 fall about 2 months ago (tripped on dog), still drives, works for [employer] (33 years), drives a motorcycle        OT Problem List: Decreased strength;Decreased range of motion;Decreased knowledge of use of DME or AE;Pain      OT Treatment/Interventions:      OT Goals(Current goals can be found in the care plan section) Acute Rehab OT Goals Patient Stated Goal: To get out of the hospital soon OT Goal Formulation: All assessment and education complete, DC therapy  OT Frequency:     Barriers to D/C:            Co-evaluation              AM-PAC OT "6 Clicks" Daily Activity     Outcome Measure Help from another person eating meals?: None Help from another person taking care of personal grooming?: None Help from  another person toileting, which includes using toliet, bedpan, or urinal?: A Little Help from another person bathing (including washing, rinsing, drying)?: A Lot Help from another person to put on and taking off regular upper body clothing?: None Help from another person to put on and taking off regular lower body clothing?: A Little 6 Click Score: 20   End of Session    Activity Tolerance: Patient tolerated treatment well Patient left: in bed;with call bell/phone within reach;with bed alarm set;with SCD's reapplied  OT Visit Diagnosis: Other abnormalities of gait and mobility (R26.89);Pain Pain - Right/Left: Right Pain - part of body: Hip                Time: 1937-9024 OT Time Calculation  (min): 31 min Charges:  OT General Charges $OT Visit: 1 Visit OT Evaluation $OT Eval Low Complexity: 1 Low OT Treatments $Self Care/Home Management : 23-37 mins  Maurilio Lovely, OTS 03/31/20, 4:12 PM

## 2020-03-31 NOTE — Evaluation (Signed)
Physical Therapy Evaluation Patient Details Name: Daniel [name] MRN: [id number] DOB: 11/23/1965 Today's Date: 03/31/2020   History of Present Illness  Pt is a 54 y.o. 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  PMH includes L THA 06/03/2019, DM, BPH, h/o alcohol abuse, h/o stroke 02/2015, htn, h/o back surgery, coronary angioplasty with stent placement, NSTEMI.  Clinical Impression  Prior to hospital admission, pt was ambulatory; lives with his wife in 1 level home with 3 STE (B railings).  Currently pt is min assist semi-supine to sitting edge of bed; CGA with transfers; and CGA with ambulation 70 feet with RW.  Occasional vc's for posterior THP's during sessions activities (pt able to state 2/3 beginning of session).  Pain R hip 7/10 beginning of session at rest and 8/10 end of session at rest (ice pack applied; pt reporting he was waiting until 0945 to ask for pain medication--nurse notified).  Pt would benefit from skilled PT to address noted impairments and functional limitations (see below for any additional details).  Upon hospital discharge, pt would benefit from HHPT.    Follow Up Recommendations Home health PT    Equipment Recommendations  None recommended by PT;Other (comment)(pt has RW and BSC at home already)    Recommendations for Other Services OT consult     Precautions / Restrictions Precautions Precautions: Posterior Hip;Fall Precaution Booklet Issued: Yes (comment) Restrictions Weight Bearing Restrictions: Yes RLE Weight Bearing: Weight bearing as tolerated     Mobility  Bed Mobility Overal bed mobility: Needs Assistance Bed Mobility: Supine to Sit     Supine to sit: Min assist;HOB elevated     General bed mobility comments: assist for R LE; vc's for posterior THP's  Transfers Overall transfer level: Needs assistance Equipment used: Rolling walker (2 wheeled) Transfers: Sit to/from Stand Sit to Stand: Min guard          General transfer comment: x1 trial standing from elevated bed and x1 trial standing from recliner up to RW; occasional vc's for posterior THP's; fairly strong stand and controlled descent sitting noted  Ambulation/Gait Ambulation/Gait assistance: Min guard Gait Distance (Feet): 70 Feet Assistive device: Rolling walker (2 wheeled) Gait Pattern/deviations: Antalgic Gait velocity: mildly decreased   General Gait Details: decreased stance time R LE; steady with RW; did well maintaining posterior THP's with turning without cueing  Stairs            Wheelchair Mobility    Modified Rankin (Stroke Patients Only)       Balance Overall balance assessment: Needs assistance Sitting-balance support: No upper extremity supported;Feet supported Sitting balance-Leahy Scale: Good Sitting balance - Comments: steady sitting reaching within BOS   Standing balance support: No upper extremity supported Standing balance-Leahy Scale: Good Standing balance comment: steady standing reaching within BOS                             Pertinent Vitals/Pain Pain Assessment: 0-10 Pain Score: 8  Pain Location: R hip Pain Descriptors / Indicators: Sore;Tender Pain Intervention(s): Limited activity within patient's tolerance;Monitored during session;Repositioned;Ice applied(pt reports plan to ask for pain medication at 0945--nurse notified)      Home Living Family/patient expects to be discharged to:: Private residence Living Arrangements: Spouse/significant other Available Help at Discharge: Family Type of Home: House Home Access: Stairs to enter Entrance Stairs-Rails: Right;Left;Can reach both Entrance Stairs-Number of Steps: 3 Home Layout: One level Home Equipment: Environmental consultant - 2  wheels;Bedside commode      Prior Function Level of Independence: Independent         Comments: 1 fall about 2 months ago (tripped on dog)     Hand Dominance        Extremity/Trunk Assessment    Upper Extremity Assessment Upper Extremity Assessment: Overall WFL for tasks assessed    Lower Extremity Assessment Lower Extremity Assessment: RLE deficits/detail RLE Deficits / Details: hip flexion at least 3-/5; knee flexion/extension at least 3/5 AROM; DF/PF at least 3/5 AROM RLE: Unable to fully assess due to pain    Cervical / Trunk Assessment Cervical / Trunk Assessment: Normal  Communication   Communication: No difficulties  Cognition Arousal/Alertness: Awake/alert Behavior During Therapy: WFL for tasks assessed/performed Overall Cognitive Status: Within Functional Limits for tasks assessed                                        General Comments General comments (skin integrity, edema, etc.): R hip hemovac in place beginning/end of session.  Nursing cleared pt for participation in physical therapy.  Pt agreeable to PT session.    Exercises Total Joint Exercises Ankle Circles/Pumps: AROM;Strengthening;Both;10 reps;Supine Quad Sets: AROM;Strengthening;Both;10 reps;Supine Gluteal Sets: AROM;Strengthening;Both;10 reps;Supine Towel Squeeze: AROM;Strengthening;Both;10 reps;Supine(pillow between pt's knees) Short Arc Quad: AROM;Strengthening;Right;10 reps;Supine Heel Slides: AAROM;AROM;Strengthening;Right;10 reps;Supine Hip ABduction/ADduction: AAROM;AROM;Strengthening;Right;10 reps;Supine   Assessment/Plan    PT Assessment Patient needs continued PT services  PT Problem List Decreased strength;Decreased activity tolerance;Decreased balance;Decreased mobility;Decreased knowledge of use of DME;Decreased knowledge of precautions;Pain;Decreased skin integrity       PT Treatment Interventions DME instruction;Gait training;Stair training;Functional mobility training;Therapeutic activities;Therapeutic exercise;Balance training;Patient/family education    PT Goals (Current goals can be found in the Care Plan section)  Acute Rehab PT Goals Patient Stated Goal: to  improve walking and decrease pain PT Goal Formulation: With patient Time For Goal Achievement: 04/14/20 Potential to Achieve Goals: Good    Frequency BID   Barriers to discharge        Co-evaluation               AM-PAC PT "6 Clicks" Mobility  Outcome Measure Help needed turning from your back to your side while in a flat bed without using bedrails?: A Little Help needed moving from lying on your back to sitting on the side of a flat bed without using bedrails?: A Little Help needed moving to and from a bed to a chair (including a wheelchair)?: A Little Help needed standing up from a chair using your arms (e.g., wheelchair or bedside chair)?: A Little Help needed to walk in hospital room?: A Little Help needed climbing 3-5 steps with a railing? : A Little 6 Click Score: 18    End of Session Equipment Utilized During Treatment: Gait belt Activity Tolerance: Patient tolerated treatment well Patient left: in chair;with call bell/phone within reach;with chair alarm set;with SCD's reapplied;Other (comment)(pillow between pt's knees for posterior THP's and B heels floating via pillow support) Nurse Communication: Mobility status;Precautions;Weight bearing status;Other (comment)(pt's pain status and plan to ask for pain medication at 0945) PT Visit Diagnosis: Other abnormalities of gait and mobility (R26.89);Muscle weakness (generalized) (M62.81);Difficulty in walking, not elsewhere classified (R26.2);Pain Pain - Right/Left: Right Pain - part of body: Hip    Time: 0177-9390 PT Time Calculation (min) (ACUTE ONLY): 35 min   Charges:   PT Evaluation $PT Eval Low Complexity: 1 Low  PT Treatments $Therapeutic Exercise: 8-22 mins $Therapeutic Activity: 8-22 mins       Leitha Bleak, PT 03/31/20, 9:43 AM

## 2020-03-31 NOTE — Progress Notes (Signed)
Physical Therapy Treatment Patient Details Name: Daniel [name] MRN: [id number] DOB: Jul 19, 1966 Today's Date: 03/31/2020    History of Present Illness Pt is a 54 y.o. 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  PMH includes L THA 06/03/2019, DM, BPH, h/o alcohol abuse, h/o stroke 02/2015, htn, h/o back surgery, coronary angioplasty with stent placement, NSTEMI.    PT Comments    Pt resting in bed upon PT arrival and reporting 7/10 R hip pain at rest (pt received pain medication prior to PT session).  Able to state 3/3 posterior THP's and maintain hip precautions during sessions activities.  SBA with bed mobility; SBA with transfers; and CGA ambulating 150 feet with RW.  Pain R hip 8/10 end of session at rest (nurse notified; ice pack applied to R hip).  Will continue to progress pt with strengthening, increasing ambulation distance; and trial stairs as appropriate.   Follow Up Recommendations  Home health PT     Equipment Recommendations  None recommended by PT;Other (comment)(pt has RW and BSC at home already)    Recommendations for Other Services OT consult     Precautions / Restrictions Precautions Precautions: Posterior Hip;Fall Precaution Booklet Issued: Yes (comment) Restrictions Weight Bearing Restrictions: Yes RLE Weight Bearing: Weight bearing as tolerated    Mobility  Bed Mobility Overal bed mobility: Needs Assistance Bed Mobility: Supine to Sit;Sit to Supine     Supine to sit: Supervision;HOB elevated Sit to supine: Supervision;HOB elevated   General bed mobility comments: mild increased effort to perform on own; SBA for hemovac management  Transfers Overall transfer level: Needs assistance Equipment used: Rolling walker (2 wheeled) Transfers: Sit to/from Stand Sit to Stand: Supervision;From elevated surface         General transfer comment: pt requesting bed height to be elevated; steady with transfers and able to maintain  posterior THP's  Ambulation/Gait Ambulation/Gait assistance: Min guard Gait Distance (Feet): 150 Feet Assistive device: Rolling walker (2 wheeled) Gait Pattern/deviations: Antalgic Gait velocity: mildly decreased   General Gait Details: decreased stance time R LE; steady with RW; did well maintaining posterior THP's with turning without cueing; 1 brief standing rest break (per cueing of therapist)   Stairs             Wheelchair Mobility    Modified Rankin (Stroke Patients Only)       Balance Overall balance assessment: Needs assistance Sitting-balance support: No upper extremity supported;Feet supported Sitting balance-Leahy Scale: Good Sitting balance - Comments: steady sitting reaching within BOS   Standing balance support: No upper extremity supported Standing balance-Leahy Scale: Good Standing balance comment: steady standing reaching within BOS                            Cognition Arousal/Alertness: Awake/alert Behavior During Therapy: WFL for tasks assessed/performed Overall Cognitive Status: Within Functional Limits for tasks assessed                                        Exercises Total Joint Exercises Long Arc Quad: AROM;Strengthening;Both;10 reps;Seated    General Comments General comments (skin integrity, edema, etc.): R hip hemovac in place beginning/end of session.  Pt agreeable to PT session; pt's wife left beginning of therapy session.      Pertinent Vitals/Pain Pain Assessment: 0-10 Pain Score: 8  Pain Location: R  hip Pain Descriptors / Indicators: Aching Pain Intervention(s): Limited activity within patient's tolerance;Monitored during session;Repositioned;Premedicated before session;Ice applied    Home Living    Prior Function       PT Goals (current goals can now be found in the care plan section) Acute Rehab PT Goals Patient Stated Goal: To get out of the hospital soon PT Goal Formulation: With  patient Time For Goal Achievement: 04/14/20 Potential to Achieve Goals: Good Progress towards PT goals: Progressing toward goals    Frequency    BID      PT Plan Current plan remains appropriate    Co-evaluation              AM-PAC PT "6 Clicks" Mobility   Outcome Measure  Help needed turning from your back to your side while in a flat bed without using bedrails?: A Little Help needed moving from lying on your back to sitting on the side of a flat bed without using bedrails?: A Little Help needed moving to and from a bed to a chair (including a wheelchair)?: A Little Help needed standing up from a chair using your arms (e.g., wheelchair or bedside chair)?: A Little Help needed to walk in hospital room?: A Little Help needed climbing 3-5 steps with a railing? : A Little 6 Click Score: 18    End of Session Equipment Utilized During Treatment: Gait belt Activity Tolerance: Patient tolerated treatment well Patient left: in bed;with call bell/phone within reach;with bed alarm set;with SCD's reapplied;Other (comment)(pillow between pt's knees (for posterior THP's) and B heels floating via pillow support) Nurse Communication: Mobility status;Precautions;Weight bearing status;Other (comment)(Pt's pain status) PT Visit Diagnosis: Other abnormalities of gait and mobility (R26.89);Muscle weakness (generalized) (M62.81);Difficulty in walking, not elsewhere classified (R26.2);Pain Pain - Right/Left: Right Pain - part of body: Hip     Time: 2956-2130 PT Time Calculation (min) (ACUTE ONLY): 23 min  Charges:  $Gait Training: 8-22 mins $Therapeutic Activity: 8-22 mins                    Leitha Bleak, PT 03/31/20, 3:29 PM

## 2020-04-01 LAB — GLUCOSE, CAPILLARY
Glucose-Capillary: 125 mg/dL — ABNORMAL HIGH (ref 70–99)
Glucose-Capillary: 199 mg/dL — ABNORMAL HIGH (ref 70–99)

## 2020-04-01 LAB — SURGICAL PATHOLOGY

## 2020-04-01 MED ORDER — OXYCODONE HCL 5 MG PO TABS: 5.0000 mg | ORAL_TABLET | ORAL | 0 refills | Status: DC | PRN

## 2020-04-01 MED ORDER — TRAMADOL HCL 50 MG PO TABS: 50.0000 mg | ORAL_TABLET | ORAL | 0 refills | Status: DC | PRN

## 2020-04-01 MED ORDER — ENOXAPARIN SODIUM 40 MG/0.4ML ~~LOC~~ SOLN
40.0000 mg | SUBCUTANEOUS | 0 refills | Status: DC
Start: 2020-04-01 — End: 2021-04-14

## 2020-04-01 NOTE — Progress Notes (Signed)
Physical Therapy Treatment Patient Details Name: Daniel [name] MRN: [id number] DOB: [birth year] Today's Date: 04/01/2020    History of Present Illness Pt is a 54 y.o. 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  PMH includes L THA 06/03/2019, DM, BPH, h/o alcohol abuse, h/o stroke 02/2015, htn, h/o back surgery, coronary angioplasty with stent placement, NSTEMI.    PT Comments     Pt resting in bed upon PT arrival; reporting pain meds this morning and 6/10 R hip pain at rest.  Modified independent with bed mobility; modified independent with transfers; CGA to SBA with ambulation around nursing loop with RW; and CGA navigating 4 steps with B railings.  Pain 6-7/10 R hip end of session at rest (nurse notified).  Pt able to verbalize 3/3 posterior THP's and also maintain posterior THP's during sessions activities.  Reviewed car transfer technique/safety and pt verbalizing appropriate understanding.  Pt reports plan to discharge home today (pt appears functionally safe to discharge home when medically appropriate).   Follow Up Recommendations  Home health PT     Equipment Recommendations  None recommended by PT;Other (comment)(pt has RW and BSC at home already)    Recommendations for Other Services OT consult     Precautions / Restrictions Precautions Precautions: Posterior Hip;Fall Precaution Booklet Issued: Yes (comment) Restrictions Weight Bearing Restrictions: Yes RLE Weight Bearing: Weight bearing as tolerated    Mobility  Bed Mobility Overal bed mobility: Needs Assistance Bed Mobility: Supine to Sit;Sit to Supine     Supine to sit: Modified independent (Device/Increase time)(x2 trials; use of R and L bed rail 1st trial and only L railing (has at home) 2nd trial) Sit to supine: Modified independent (Device/Increase time)(x2 trials; no bed rail use)   General bed mobility comments: mild increased time/effort to perform on own supine to sit (compared to  sit to supine); able to maintain posterior THP's  Transfers Overall transfer level: Needs assistance Equipment used: Rolling walker (2 wheeled) Transfers: Sit to/from Stand Sit to Stand: Modified independent (Device/Increase time)         General transfer comment: x1 trial from bed, x1 trial from mat table, and x1 trial from [facility] over toilet (sit to stand transfers); steady and able to maintain posterior THP's without cueing  Ambulation/Gait Ambulation/Gait assistance: Min guard;Supervision Gait Distance (Feet): 220 Feet Assistive device: Rolling walker (2 wheeled) Gait Pattern/deviations: Antalgic Gait velocity: mildly decreased   General Gait Details: decreased stance time R LE; steady with RW; did well maintaining posterior THP's with turning without cueing; 1 brief standing rest break; good heelstrike   Stairs Stairs: Yes Stairs assistance: Min guard Stair Management: Two rails;Step to pattern;Forwards Number of Stairs: 4 General stair comments: pt able to verbalize appropriate LE sequencing and appearing steady and safe with stairs navigation   Wheelchair Mobility    Modified Rankin (Stroke Patients Only)       Balance Overall balance assessment: Needs assistance Sitting-balance support: No upper extremity supported;Feet supported Sitting balance-Leahy Scale: Normal Sitting balance - Comments: steady sitting reaching outside BOS (maintaining posterior THP's)   Standing balance support: No upper extremity supported Standing balance-Leahy Scale: Good Standing balance comment: steady standing washing hands at sink and brushing teeth at sink                            Cognition Arousal/Alertness: Awake/alert Behavior During Therapy: WFL for tasks assessed/performed Overall Cognitive Status: Within Functional Limits  for tasks assessed                                        Exercises Total Joint Exercises Ankle Circles/Pumps:  AROM;Strengthening;Both;10 reps;Supine Quad Sets: AROM;Strengthening;Both;10 reps;Supine Gluteal Sets: AROM;Strengthening;Both;10 reps;Supine Towel Squeeze: AROM;Strengthening;Both;10 reps;Supine(pillow between pt's knees) Short Arc Quad: AROM;Strengthening;Right;10 reps;Supine Heel Slides: AROM;Strengthening;Right;10 reps;Supine Hip ABduction/ADduction: AAROM;AROM;Strengthening;Right;10 reps;Supine    General Comments  Pt agreeable to PT session.      Pertinent Vitals/Pain Pain Assessment: 0-10 Pain Score: 6  Pain Descriptors / Indicators: Aching Pain Intervention(s): Limited activity within patient's tolerance;Monitored during session;Premedicated before session;Repositioned  Vitals (HR and O2 on room air) stable and WFL throughout treatment session.    Home Living                      Prior Function            PT Goals (current goals can now be found in the care plan section) Acute Rehab PT Goals Patient Stated Goal: To get out of the hospital soon PT Goal Formulation: With patient Time For Goal Achievement: 04/14/20 Potential to Achieve Goals: Good    Frequency    BID      PT Plan      Co-evaluation              AM-PAC PT "6 Clicks" Mobility   Outcome Measure  Help needed turning from your back to your side while in a flat bed without using bedrails?: None Help needed moving from lying on your back to sitting on the side of a flat bed without using bedrails?: None Help needed moving to and from a bed to a chair (including a wheelchair)?: A Little Help needed standing up from a chair using your arms (e.g., wheelchair or bedside chair)?: A Little Help needed to walk in hospital room?: A Little Help needed climbing 3-5 steps with a railing? : A Little 6 Click Score: 20    End of Session Equipment Utilized During Treatment: Gait belt Activity Tolerance: Patient tolerated treatment well Patient left: in bed;with call bell/phone within reach;with  bed alarm set;with SCD's reapplied;Other (comment)(pillow between pt's knees for posterior THP's and B heels floating via pillow support) Nurse Communication: Mobility status;Precautions;Weight bearing status;Other (comment)(pt's pain status; need for ice for ice pack; pt had bowel movement during session) PT Visit Diagnosis: Other abnormalities of gait and mobility (R26.89);Muscle weakness (generalized) (M62.81);Difficulty in walking, not elsewhere classified (R26.2);Pain Pain - Right/Left: Right Pain - part of body: Hip     Time: 6283-1517 PT Time Calculation (min) (ACUTE ONLY): 43 min  Charges:  $Gait Training: 8-22 mins $Therapeutic Exercise: 8-22 mins $Therapeutic Activity: 8-22 mins                     Leitha Bleak, PT 04/01/20, 10:03 AM

## 2020-04-01 NOTE — Discharge Summary (Signed)
Physician Discharge Summary  Patient ID: DEMAURION DICIOCCIO MRN: [id number] DOB/AGE: 1966-04-25 54 y.o.  Admit date: 03/30/2020 Discharge date: 04/01/2020  Admission Diagnoses:  H/O total hip arthroplasty [Z96.649]  Surgeries:Procedure(s): Right total hip arthroplasty  SURGEON:  Jena Gauss. M.D.  ASSISTANT: Baldwin Jamaica, PA-C (present and scrubbed throughout the case, critical for assistance with exposure, retraction, instrumentation, and closure)  ANESTHESIA: general  ESTIMATED BLOOD LOSS: 500 mL  FLUIDS REPLACED: 1300 mL of crystalloid  DRAINS: 2 medium drains to a Hemovac reservoir  IMPLANTS UTILIZED: DePuy 13.5 mm small stature AML femoral stem, 54 mm OD Pinnacle 100 acetabular component, neutral Pinnacle Marathon polyethylene insert, and a 36 mm M-SPEC +1.5 mm hip ball   Discharge Diagnoses: Patient Active Problem List   Diagnosis Date Noted  . H/O total hip arthroplasty 03/30/2020  . Status post total replacement of left hip 06/03/2019  . Thrombocytopenia (HCC) 12/31/2018  . Left hip pain 12/15/2018  . Type 2 diabetes mellitus with hyperglycemia, without long-term current use of insulin (HCC) 07/22/2017  . Coronary artery disease involving native coronary artery of native heart without angina pectoris 03/20/2016  . History of non-ST elevation myocardial infarction (NSTEMI) 03/20/2016  . Hyperlipidemia associated with type 2 diabetes mellitus (HCC) 03/12/2016  . NSTEMI (non-ST elevated myocardial infarction) (HCC) 03/10/2016  . Prostate cancer screening 02/10/2016  . H/O: CVA (cerebrovascular accident) 03/01/2015  . Cerebral infarction (HCC) 03/01/2015  . Right knee pain 04/06/2014  . Benign paroxysmal positional vertigo 03/10/2014  . BPH (benign prostatic hyperplasia) 11/16/2013  . Routine general medical examination at a health care facility 11/12/2013  . Morbid obesity (HCC) 03/02/2011  . B12 deficiency 09/29/2007  . ERECTILE DYSFUNCTION 07/01/2007  .  History of tobacco abuse 07/01/2007  . LOW BACK PAIN, CHRONIC 07/01/2007  . HYPERTENSION, BENIGN ESSENTIAL 06/03/2007    Past Medical History:  Diagnosis Date  . Arthritis   . CAD (coronary artery disease)    a. NSTEMI: 100% stenosis of the distal RCA --> DES placed 5/17 CLEARED BY CARDIOLOGIST  . Chronic low back pain    a. s/p back surgery.  . CVA (cerebral infarction)    a. 02/2015 dyarthria 2/2 CVA involving the lateral aspect of the precentral gyrus.  . DM2 (diabetes mellitus, type 2) (HCC)    a. pre-diabetic in the past. b. A1c 03/2016 elevated to 7.9.  . ETOH abuse   . GERD (gastroesophageal reflux disease)   . History of echocardiogram    a. Mild LVH, EF 55-60%, Definity contrast used-LV wall motion could not be adequately assessed  . History of non-ST elevation myocardial infarction (NSTEMI) 03/2016   a. PCI: 3.5 x 24 mm Promus Premier DES to distal RCA  . History of tobacco abuse   . Hypertension    a. 02/2015 echo: 55-65%, trace TR/MR. b. 03/2016: echo with EF of 55-60%.   . Myocardial infarction (HCC)   . Obesity   . Ruptured disk   . Stroke [facility])    2016     Transfusion:    Consultants (if any):   Discharged Condition: Improved  Hospital Course: Daniel [name] is an 54 y.o. male who was admitted 03/30/2020 with a diagnosis of right hip osteoarthritis and went to the operating room on 03/30/2020 and underwent right total hip arthroplasty. The patient received perioperative antibiotics for prophylaxis (see below). The patient tolerated the procedure well and was transported to PACU in stable condition. After meeting PACU criteria, the patient was subsequently transferred to  the Orthopaedics/Rehabilitation unit.   The patient received DVT prophylaxis in the form of early mobilization, Lovenox, Foot Pumps and TED hose. A sacral pad had been placed and heels were elevated off of the bed with rolled towels in order to protect skin integrity. Foley catheter was discontinued  on postoperative day #0. Wound drains were discontinued on postoperative day #2. The surgical incision was healing well without signs of infection.  Physical therapy was initiated postoperatively for transfers, gait training, and strengthening. Occupational therapy was initiated for activities of daily living and evaluation for assisted devices. Rehabilitation goals were reviewed in detail with the patient. The patient made steady progress with physical therapy and physical therapy recommended discharge to Home.   The patient achieved his preliminary goals of this hospitalization and was felt to be medically and orthopaedically appropriate for discharge.  He was given perioperative antibiotics:  Anti-infectives (From admission, onward)   Start     Dose/Rate Route Frequency Ordered Stop   03/30/20 1753  ceFAZolin (ANCEF) IVPB 2g/100 mL premix     2 g 200 mL/hr over 30 Minutes Intravenous Every 6 hours 03/30/20 1753 03/31/20 1224   03/30/20 1002  ceFAZolin (ANCEF) 2-4 GM/100ML-% IVPB    Note to Pharmacy: Dewayne Hatch   : cabinet override      03/30/20 1002 03/30/20 1246   03/30/20 0830  ceFAZolin (ANCEF) IVPB 2g/100 mL premix     2 g 200 mL/hr over 30 Minutes Intravenous On call to O.R. 03/30/20 1194 03/30/20 1235    .  Recent vital signs:  Vitals:   04/01/20 0001 04/01/20 0727  BP: 110/61 139/70  Pulse: 62 (!) 59  Resp: 17 18  Temp: 98 F (36.7 C) 97.8 F (36.6 C)  SpO2: 91% 97%    Recent laboratory studies:  No results for input(s): WBC, HGB, HCT, PLT, K, CL, CO2, BUN, CREATININE, GLUCOSE, CALCIUM, LABPT, INR in the last 72 hours.  Diagnostic Studies: DG Hip Port Unilat With Pelvis 1V Right  Result Date: 03/30/2020 CLINICAL DATA:  Post right hip replacement EXAM: DG HIP (WITH OR WITHOUT PELVIS) 1V PORT RIGHT COMPARISON:  06/03/2019 FINDINGS: Previous left hip replacement with normal alignment. Interval right hip replacement with normal alignment and intact hardware. Pubic  symphysis and rami are intact. Surgical drain over the right hip. Cutaneous staples and mild gas in the soft tissues. IMPRESSION: Interval right hip replacement with expected postsurgical change. Electronically Signed   By: Donavan Foil M.D.   On: 03/30/2020 16:58    Discharge Medications:   Allergies as of 04/01/2020      Reactions   Ace Inhibitors Cough      Medication List    STOP taking these medications   aspirin 81 MG tablet     TAKE these medications   amLODipine 5 MG tablet Commonly known as: NORVASC Take 1 tablet (5 mg total) by mouth daily.   atorvastatin 80 MG tablet Commonly known as: LIPITOR TAKE 1 TABLET (80 MG TOTAL) BY MOUTH DAILY AT 6 PM. NEEDS APPT WITH DR Angelena Form FOR APRIL FOR FURTHER REFILLS What changed: See the new instructions.   carvedilol 6.25 MG tablet Commonly known as: COREG Take 1 tablet (6.25 mg total) by mouth 2 (two) times daily with a meal. Please keep upcoming appt in May with Dr. Angelena Form before anymore refills. Thank you What changed: additional instructions   celecoxib 200 MG capsule Commonly known as: CELEBREX Take 1 capsule (200 mg total) by mouth 2 (two) times  daily.   enoxaparin 40 MG/0.4ML injection Commonly known as: LOVENOX Inject 0.4 mLs (40 mg total) into the skin daily for 14 days.   hydrochlorothiazide 25 MG tablet Commonly known as: HYDRODIURIL Take 0.5 tablets (12.5 mg total) by mouth daily.   metFORMIN 1000 MG tablet Commonly known as: GLUCOPHAGE Take 1 tablet (1,000 mg total) by mouth 2 (two) times daily with a meal.   nitroGLYCERIN 0.4 MG SL tablet Commonly known as: NITROSTAT Place 1 tablet (0.4 mg total) under the tongue every 5 (five) minutes as needed for chest pain (CP or SOB).   oxyCODONE 5 MG immediate release tablet Commonly known as: Oxy IR/ROXICODONE Take 1 tablet (5 mg total) by mouth every 4 (four) hours as needed for moderate pain (pain score 4-6).   PROBIOTIC PO Take 1 capsule by mouth daily.     sildenafil 50 MG tablet Commonly known as: VIAGRA Take 1 tablet (50 mg total) by mouth daily as needed. Take one by mouth once daily as directed as needed 30 minutes before sexual activity. What changed:   reasons to take this  additional instructions   traMADol 50 MG tablet Commonly known as: ULTRAM Take 1 tablet (50 mg total) by mouth every 4 (four) hours as needed for moderate pain.   valsartan 80 MG tablet Commonly known as: Diovan Take 1 tablet (80 mg total) by mouth daily.   Vitamin B-12 5000 MCG Tbdp Take 5,000 mcg by mouth 2 (two) times a day.            Durable Medical Equipment  (From admission, onward)         Start     Ordered   03/30/20 1754  DME Walker rolling  Once    Question:  Patient needs a walker to treat with the following condition  Answer:  S/P total hip arthroplasty   03/30/20 1753   03/30/20 1754  DME Bedside commode  Once    Question:  Patient needs a bedside commode to treat with the following condition  Answer:  S/P total hip arthroplasty   03/30/20 1753          Disposition: home with home health PT     Follow-up Information    Donato Heinz, MD On 05/12/2020.   Specialty: Orthopedic Surgery Why: at 2:45pm Contact information: [address] [facility] [place] [address] [phone]            Lasandra Beech, PA-C 04/01/2020, 12:26 PM

## 2020-04-01 NOTE — Progress Notes (Signed)
Patient given discharge instructions and discharged home with family member via personal vehicle. Two Honeycomb bandages sent home with patient. Follow-up appointments and medications gone over with patient prior to d/c.

## 2020-04-01 NOTE — Progress Notes (Signed)
  Subjective: 2 Days Post-Op Procedure(s) (LRB): TOTAL HIP ARTHROPLASTY (Right) Patient reports pain as well-controlled.   Patient is well, and has had no acute complaints or problems Plan is to go Home after hospital stay. Negative for chest pain and shortness of breath Fever: no Gastrointestinal: negative for nausea and vomiting.   Patient has not had a bowel movement.  Objective: Vital signs in last 24 hours: Temp:  [97.8 F (36.6 C)-98 F (36.7 C)] 97.8 F (36.6 C) (05/28 0727) Pulse Rate:  [56-62] 59 (05/28 0727) Resp:  [16-18] 18 (05/28 0727) BP: (106-139)/(61-70) 139/70 (05/28 0727) SpO2:  [91 %-97 %] 97 % (05/28 0727)  Intake/Output from previous day:  Intake/Output Summary (Last 24 hours) at 04/01/2020 1214 Last data filed at 04/01/2020 1027 Gross per 24 hour  Intake 120 ml  Output 1050 ml  Net -930 ml    Intake/Output this shift: Total I/O In: 120 [P.O.:120] Out: 600 [Urine:600]  Labs: No results for input(s): HGB in the last 72 hours. No results for input(s): WBC, RBC, HCT, PLT in the last 72 hours. No results for input(s): NA, K, CL, CO2, BUN, CREATININE, GLUCOSE, CALCIUM in the last 72 hours. No results for input(s): LABPT, INR in the last 72 hours.   EXAM General - Patient is Alert, Appropriate and Oriented Extremity - Neurovascular intact Dorsiflexion/Plantar flexion intact Compartment soft Dressing/Incision -Hemovac in place. Minimal sanguinous drainage noted over dressing. Motor Function - intact, moving foot and toes well on exam.  Cardiovascular- Regular rate and rhythm, no murmurs/rubs/gallops Respiratory- Lungs clear to auscultation bilaterally Gastrointestinal- soft, nontender and active bowel sounds   Assessment/Plan: 2 Days Post-Op Procedure(s) (LRB): TOTAL HIP ARTHROPLASTY (Right) Active Problems:   H/O total hip arthroplasty  Estimated body mass index is 38.74 kg/m as calculated from the following:   Height as of this encounter:  5\' 10"  (1.778 m).   Weight as of this encounter: 122.5 kg. Advance diet Up with therapy Discharge home with home health  Hemovac removed.   DVT Prophylaxis - Lovenox, Ted hose and foot pumps Weight-Bearing as tolerated to right leg  , PA-C [facility] Orthopaedic Surgery 04/01/2020, 12:14 PM

## 2020-04-01 NOTE — TOC Transition Note (Signed)
Transition of Care [facility]) - CM/SW Discharge Note   Patient Details  Name: Daniel [name] MRN: [id number] Date of Birth: 1966/11/03  Transition of Care [facility]) CM/SW Contact:  Barrie Dunker, RN Phone Number: 04/01/2020, 1:03 PM   Clinical Narrative:     The patient is Set up with [facility] for Home health services, He has DME at home and does not need additional  Final next level of care: Home w Home Health Services Barriers to Discharge: Continued Medical Work up   Patient Goals and CMS Choice Patient states their goals for this hospitalization and ongoing recovery are:: go home as soon as possible      Discharge Placement                       Discharge Plan and Services   Discharge Planning Services: CM Consult            DME Arranged: N/A         HH Arranged: PT HH Agency: [facility] (formerly [facility]) Date HH Agency Contacted: 03/31/20 Time HH Agency Contacted: [id number] Representative spoke with at [facility] Agency: Rosey Bath  Social Determinants of Health (SDOH) Interventions     Readmission Risk Interventions No flowsheet data found.

## 2020-04-08 ENCOUNTER — Other Ambulatory Visit: Payer: Self-pay | Admitting: Cardiovascular Disease

## 2020-06-09 ENCOUNTER — Other Ambulatory Visit: Payer: Self-pay | Admitting: Cardiology

## 2020-09-07 ENCOUNTER — Other Ambulatory Visit: Payer: Self-pay | Admitting: Cardiovascular Disease

## 2020-09-08 ENCOUNTER — Telehealth: Payer: Self-pay | Admitting: Family Medicine

## 2020-09-08 DIAGNOSIS — E1165 Type 2 diabetes mellitus with hyperglycemia: Secondary | ICD-10-CM

## 2020-09-08 DIAGNOSIS — N400 Enlarged prostate without lower urinary tract symptoms: Secondary | ICD-10-CM

## 2020-09-08 DIAGNOSIS — Z0000 Encounter for general adult medical examination without abnormal findings: Secondary | ICD-10-CM

## 2020-09-08 DIAGNOSIS — Z125 Encounter for screening for malignant neoplasm of prostate: Secondary | ICD-10-CM

## 2020-09-08 DIAGNOSIS — I10 Essential (primary) hypertension: Secondary | ICD-10-CM

## 2020-09-08 DIAGNOSIS — D696 Thrombocytopenia, unspecified: Secondary | ICD-10-CM

## 2020-09-08 DIAGNOSIS — E538 Deficiency of other specified B group vitamins: Secondary | ICD-10-CM

## 2020-09-08 DIAGNOSIS — E1169 Type 2 diabetes mellitus with other specified complication: Secondary | ICD-10-CM

## 2020-09-08 NOTE — Telephone Encounter (Signed)
-----   Message from Aquilla Solian, RT sent at 08/25/2020  2:28 PM EDT ----- Regarding: Lab Orders for Friday 11.5.2021 Please place lab orders for Friday 11.5.2021, office visit for 6 mo f/u on Friday 11.12.2021 Thank you, Jones Bales RT(R)

## 2020-09-09 ENCOUNTER — Other Ambulatory Visit: Payer: BC Managed Care – PPO

## 2020-09-16 ENCOUNTER — Ambulatory Visit: Payer: BC Managed Care – PPO | Admitting: Family Medicine

## 2021-03-10 ENCOUNTER — Other Ambulatory Visit: Payer: Self-pay | Admitting: Cardiovascular Disease

## 2021-03-11 ENCOUNTER — Other Ambulatory Visit: Payer: Self-pay | Admitting: Family Medicine

## 2021-03-13 NOTE — Telephone Encounter (Signed)
done

## 2021-03-13 NOTE — Telephone Encounter (Signed)
Anastasiya tried to get pt to schedule an appt (hasn't been seen in over a year) and pt declined saying he would have to call back to schedule an appt, but wants our meds refill still, please advise

## 2021-03-13 NOTE — Telephone Encounter (Signed)
Refill for a month and tell him to call and schedule within that month (a summer visit is fine)

## 2021-04-08 ENCOUNTER — Telehealth: Payer: Self-pay | Admitting: Family Medicine

## 2021-04-08 ENCOUNTER — Other Ambulatory Visit: Payer: Self-pay | Admitting: Cardiovascular Disease

## 2021-04-09 ENCOUNTER — Other Ambulatory Visit: Payer: Self-pay | Admitting: Family Medicine

## 2021-04-10 ENCOUNTER — Other Ambulatory Visit: Payer: Self-pay | Admitting: Cardiovascular Disease

## 2021-04-10 ENCOUNTER — Other Ambulatory Visit: Payer: Self-pay | Admitting: Family Medicine

## 2021-04-10 NOTE — Telephone Encounter (Signed)
Pt was advised on 03/11/21 refill that he needs an appt before we refill anymore meds pt never mad appt, rxs declined until pt calls back and schedules an appt

## 2021-04-11 NOTE — Telephone Encounter (Signed)
done

## 2021-04-11 NOTE — Telephone Encounter (Signed)
Mr. goodbar called in and set the appointment for 6/10 @ 12. Please refill meds he is out

## 2021-04-11 NOTE — Telephone Encounter (Signed)
Pt has an appt scheduled for 07/12/21. Only has medication to last through today. Requesting enough medication to last until appointment.

## 2021-04-14 ENCOUNTER — Ambulatory Visit: Payer: BC Managed Care – PPO | Admitting: Family Medicine

## 2021-04-14 ENCOUNTER — Other Ambulatory Visit: Payer: Self-pay

## 2021-04-14 ENCOUNTER — Encounter: Payer: Self-pay | Admitting: Family Medicine

## 2021-04-14 VITALS — BP 116/72 | HR 61 | Temp 97.0°F | Ht 70.0 in | Wt 269.4 lb

## 2021-04-14 DIAGNOSIS — D696 Thrombocytopenia, unspecified: Secondary | ICD-10-CM | POA: Diagnosis not present

## 2021-04-14 DIAGNOSIS — E1169 Type 2 diabetes mellitus with other specified complication: Secondary | ICD-10-CM | POA: Diagnosis not present

## 2021-04-14 DIAGNOSIS — E785 Hyperlipidemia, unspecified: Secondary | ICD-10-CM | POA: Diagnosis not present

## 2021-04-14 DIAGNOSIS — I10 Essential (primary) hypertension: Secondary | ICD-10-CM | POA: Diagnosis not present

## 2021-04-14 DIAGNOSIS — E1165 Type 2 diabetes mellitus with hyperglycemia: Secondary | ICD-10-CM | POA: Diagnosis not present

## 2021-04-14 DIAGNOSIS — Z6838 Body mass index (BMI) 38.0-38.9, adult: Secondary | ICD-10-CM

## 2021-04-14 DIAGNOSIS — I2510 Atherosclerotic heart disease of native coronary artery without angina pectoris: Secondary | ICD-10-CM

## 2021-04-14 DIAGNOSIS — E6609 Other obesity due to excess calories: Secondary | ICD-10-CM | POA: Insufficient documentation

## 2021-04-14 LAB — CBC WITH DIFFERENTIAL/PLATELET
Basophils Absolute: 0.1 10*3/uL (ref 0.0–0.1)
Basophils Relative: 0.7 % (ref 0.0–3.0)
Eosinophils Absolute: 0.6 10*3/uL (ref 0.0–0.7)
Eosinophils Relative: 7 % — ABNORMAL HIGH (ref 0.0–5.0)
HCT: 40.9 % (ref 39.0–52.0)
Hemoglobin: 14.1 g/dL (ref 13.0–17.0)
Lymphocytes Relative: 22.3 % (ref 12.0–46.0)
Lymphs Abs: 1.9 10*3/uL (ref 0.7–4.0)
MCHC: 34.5 g/dL (ref 30.0–36.0)
MCV: 92.8 fl (ref 78.0–100.0)
Monocytes Absolute: 0.9 10*3/uL (ref 0.1–1.0)
Monocytes Relative: 10.5 % (ref 3.0–12.0)
Neutro Abs: 5 10*3/uL (ref 1.4–7.7)
Neutrophils Relative %: 59.5 % (ref 43.0–77.0)
Platelets: 119 10*3/uL — ABNORMAL LOW (ref 150.0–400.0)
RBC: 4.41 Mil/uL (ref 4.22–5.81)
RDW: 12.9 % (ref 11.5–15.5)
WBC: 8.4 10*3/uL (ref 4.0–10.5)

## 2021-04-14 LAB — MICROALBUMIN / CREATININE URINE RATIO
Creatinine,U: 83.3 mg/dL
Microalb Creat Ratio: 6.6 mg/g (ref 0.0–30.0)
Microalb, Ur: 5.5 mg/dL — ABNORMAL HIGH (ref 0.0–1.9)

## 2021-04-14 LAB — HEMOGLOBIN A1C: Hgb A1c MFr Bld: 7.6 % — ABNORMAL HIGH (ref 4.6–6.5)

## 2021-04-14 LAB — LIPID PANEL
Cholesterol: 83 mg/dL (ref 0–200)
HDL: 29.5 mg/dL — ABNORMAL LOW (ref >39.00)
LDL Cholesterol: 42 mg/dL (ref 0–99)
NonHDL: 53.3
Total CHOL/HDL Ratio: 3
Triglycerides: 57 mg/dL (ref 0.0–149.0)
VLDL: 11.4 mg/dL (ref 0.0–40.0)

## 2021-04-14 LAB — COMPREHENSIVE METABOLIC PANEL
ALT: 67 U/L — ABNORMAL HIGH (ref 0–53)
AST: 35 U/L (ref 0–37)
Albumin: 4.6 g/dL (ref 3.5–5.2)
Alkaline Phosphatase: 121 U/L — ABNORMAL HIGH (ref 39–117)
BUN: 19 mg/dL (ref 6–23)
CO2: 27 mEq/L (ref 19–32)
Calcium: 10 mg/dL (ref 8.4–10.5)
Chloride: 103 mEq/L (ref 96–112)
Creatinine, Ser: 0.98 mg/dL (ref 0.40–1.50)
GFR: 85.83 mL/min (ref >60.00)
Glucose, Bld: 158 mg/dL — ABNORMAL HIGH (ref 70–99)
Potassium: 4.4 mEq/L (ref 3.5–5.1)
Sodium: 141 mEq/L (ref 135–145)
Total Bilirubin: 0.8 mg/dL (ref 0.2–1.2)
Total Protein: 7.1 g/dL (ref 6.0–8.3)

## 2021-04-14 LAB — TSH: TSH: 2.31 u[IU]/mL (ref 0.35–4.50)

## 2021-04-14 MED ORDER — HYDROCHLOROTHIAZIDE 25 MG PO TABS: 0.5000 | ORAL_TABLET | Freq: Every day | ORAL | 3 refills | Status: DC

## 2021-04-14 MED ORDER — METFORMIN HCL 1000 MG PO TABS: 1.0000 | ORAL_TABLET | Freq: Two times a day (BID) | ORAL | 3 refills | Status: DC

## 2021-04-14 MED ORDER — AMLODIPINE BESYLATE 5 MG PO TABS: 1.0000 | ORAL_TABLET | Freq: Every day | ORAL | 3 refills | Status: DC

## 2021-04-14 MED ORDER — VALSARTAN 80 MG PO TABS: 80.0000 mg | ORAL_TABLET | Freq: Every day | ORAL | 3 refills | Status: DC

## 2021-04-14 MED ORDER — SILDENAFIL CITRATE 50 MG PO TABS: 50.0000 mg | ORAL_TABLET | Freq: Every day | ORAL | 11 refills | Status: DC | PRN

## 2021-04-14 NOTE — Assessment & Plan Note (Signed)
Continue asa, cardiology f/u  Working to control  bp and cholesterol and glucose  Wt loss enc  No angina

## 2021-04-14 NOTE — Assessment & Plan Note (Signed)
Discussed how this problem influences overall health and the risks it imposes  Reviewed plan for weight loss with lower calorie diet (via better food choices and also portion control or program like weight watchers) and exercise building up to or more than 30 minutes 5 days per week including some aerobic activity    Pt continues to work on it  

## 2021-04-14 NOTE — Assessment & Plan Note (Signed)
Due for labs today  Aware he has eaten Last LDL 31  Disc goals for lipids and reasons to control them Rev last labs with pt Rev low sat fat diet in detail Plan to continue atorvastatin 80 mg daily

## 2021-04-14 NOTE — Assessment & Plan Note (Signed)
bp in fair control at this time  BP Readings from Last 1 Encounters:  04/14/21 116/72   No changes needed Most recent labs reviewed  Disc lifstyle change with low sodium diet and exercise  Also under care of cardiology in setting of CAD Plan to continue Takes coreg 6.25 mg bid Amlodipine 5 mg daily  Diovan 80 mg daily Hctz 12.5 mg daily

## 2021-04-14 NOTE — Patient Instructions (Signed)
Keep working on healthy diabetic diet  Stay active -walking   Labs today  No change in medicines for now  Follow up with cardiology when it is time

## 2021-04-14 NOTE — Assessment & Plan Note (Signed)
Lab Results  Component Value Date   HGBA1C 7.1 (H) 03/24/2020   Pending a1c from today  Per pt good glucose readings at home and getting back to a better diet  Allergic to ace- microalb ordered  Due for eye exam- ref done Nl foot exam  Adv to continue low glycemic diet

## 2021-04-14 NOTE — Progress Notes (Signed)
Subjective:    Patient ID: Daniel [name], male    DOB: Nov 15, 1965, 55 y.o.   MRN: [id number]  This visit occurred during the SARS-CoV-2 public health emergency.  Safety protocols were in place, including screening questions prior to the visit, additional usage of staff PPE, and extensive cleaning of exam room while observing appropriate contact time as indicated for disinfecting solutions.   HPI Pt presents for f/u of chronic health problems including HTN and DM2  Wt Readings from Last 3 Encounters:  03/30/20 270 lb (122.5 kg)  03/28/20 273 lb (123.8 kg)  03/21/20 269 lb (122 kg)   38.65 kg/m  Has worked on [employer]  Eating less fatty and sugary foods  Lots of chicken and green veggies    Had hip repl -went well  Had shoulder surg for tear and rehabbing -was rough (from a fall)  Getting back to routine  Feels fine    HTN  Sees cardiology for this and CAD-no problems and has f/u in sept  bp is stable today  No cp or palpitations or headaches or edema  No side effects to medicines  BP Readings from Last 3 Encounters:  04/14/21 116/72  04/01/20 139/70  03/28/20 130/68      Takes coreg 6.25 mg bid Amlodipine 5 mg daily  Diovan 80 mg daily Hctz 12.5 mg daily   Pulse Readings from Last 3 Encounters:  04/14/21 61  04/01/20 (!) 59  03/28/20 66    DM2 Lab Results  Component Value Date   HGBA1C 7.1 (H) 03/24/2020   Due for labs  110-140 for glucose readings  Takes metformin 100 mg bid  On statin  Allergic to ace Due for microalb Due for eye exam ( put if off for his surgery)  Goes to [facility]   Getting back on treadmill  He works 11-12 hours per day    Hyperlipidemia Lab Results  Component Value Date   CHOL 66 01/22/2020   HDL 26.00 (L) 01/22/2020   LDLCALC 31 01/22/2020   TRIG 48.0 01/22/2020   CHOLHDL 3 01/22/2020   Takes atorvastatin 80 mg daily  Avoids fried foods  Seldom red meat   Patient Active Problem List   Diagnosis Date Noted    Class 2 obesity due to excess calories with body mass index (BMI) of 38.0 to 38.9 in adult 04/14/2021   H/O total hip arthroplasty 03/30/2020   Status post total replacement of left hip 06/03/2019   Thrombocytopenia (HCC) 12/31/2018   Left hip pain 12/15/2018   Type 2 diabetes mellitus with hyperglycemia, without long-term current use of insulin (HCC) 07/22/2017   Coronary artery disease involving native coronary artery of native heart without angina pectoris 03/20/2016   History of non-ST elevation myocardial infarction (NSTEMI) 03/20/2016   Hyperlipidemia associated with type 2 diabetes mellitus (HCC) 03/12/2016   NSTEMI (non-ST elevated myocardial infarction) (HCC) 03/10/2016   Prostate cancer screening 02/10/2016   H/O: CVA (cerebrovascular accident) 03/01/2015   Cerebral infarction (HCC) 03/01/2015   Right knee pain 04/06/2014   Benign paroxysmal positional vertigo 03/10/2014   BPH (benign prostatic hyperplasia) 11/16/2013   Routine general medical examination at a health care facility 11/12/2013   B12 deficiency 09/29/2007   ERECTILE DYSFUNCTION 07/01/2007   History of tobacco abuse 07/01/2007   LOW BACK PAIN, CHRONIC 07/01/2007   HYPERTENSION, BENIGN ESSENTIAL 06/03/2007   Past Medical History:  Diagnosis Date   Arthritis    CAD (coronary artery disease)    a.  NSTEMI: 100% stenosis of the distal RCA --> DES placed 5/17 CLEARED BY CARDIOLOGIST   Chronic low back pain    a. s/p back surgery.   CVA (cerebral infarction)    a. 02/2015 dyarthria 2/2 CVA involving the lateral aspect of the precentral gyrus.   DM2 (diabetes mellitus, type 2) (HCC)    a. pre-diabetic in the past. b. A1c 03/2016 elevated to 7.9.   ETOH abuse    GERD (gastroesophageal reflux disease)    History of echocardiogram    a. Mild LVH, EF 55-60%, Definity contrast used-LV wall motion could not be adequately assessed   History of non-ST elevation myocardial infarction (NSTEMI) 03/2016   a. PCI: 3.5 x 24 mm  Promus Premier DES to distal RCA   History of tobacco abuse    Hypertension    a. 02/2015 echo: 55-65%, trace TR/MR. b. 03/2016: echo with EF of 55-60%.    Myocardial infarction [facility])    Obesity    Ruptured disk    Stroke [facility])    2016   Past Surgical History:  Procedure Laterality Date   BACK SURGERY  2000   ruptured disk, L-S   CARDIAC CATHETERIZATION  05/2004   minimal CAD   CARDIAC CATHETERIZATION N/A 03/10/2016   Procedure: Left Heart Cath and Coronary Angiography;  Surgeon: Kathleene Hazel, MD;  Location: [facility] INVASIVE CV LAB;  Service: Cardiovascular;  Laterality: N/A;   CARDIAC CATHETERIZATION N/A 03/10/2016   Procedure: Coronary Stent Intervention;  Surgeon: Kathleene Hazel, MD;  Location: [facility] INVASIVE CV LAB;  Service: Cardiovascular;  Laterality: N/A;   CATARACT EXTRACTION W/PHACO Left 07/12/2016   Procedure: CATARACT EXTRACTION PHACO AND INTRAOCULAR LENS PLACEMENT (IOC);  Surgeon: Nevada Crane, MD;  Location: [facility] ORS;  Service: Ophthalmology;  Laterality: Left;  [place]  00:50 AP% 9.2 CDE 4.63 fluid pack lot # [id number]   CORONARY ANGIOPLASTY     STENT 5/17   TOTAL HIP ARTHROPLASTY Left 06/03/2019   Procedure: TOTAL HIP ARTHROPLASTY;  Surgeon: Donato Heinz, MD;  Location: [facility] ORS;  Service: Orthopedics;  Laterality: Left;   TOTAL HIP ARTHROPLASTY Right 03/30/2020   Procedure: TOTAL HIP ARTHROPLASTY;  Surgeon: Donato Heinz, MD;  Location: [facility] ORS;  Service: Orthopedics;  Laterality: Right;  posterior    Social History   Tobacco Use   Smoking status: Former    Pack years: 0.00    Types: Cigarettes    Quit date: 01/18/2015    Years since quitting: 6.2   Smokeless tobacco: Never  Vaping Use   Vaping Use: Never used  Substance Use Topics   Alcohol use: Yes    Alcohol/week: 12.0 standard drinks    Types: 12 Cans of beer per week   Drug use: No   Family History  Problem Relation Age of Onset   Dementia Mother    Hypertension Father    Heart disease  Father        MI   Cancer Maternal Grandmother        lung   Heart failure Paternal Grandfather    Allergies  Allergen Reactions   Ace Inhibitors Cough   Current Outpatient Medications on File Prior to Visit  Medication Sig Dispense Refill   atorvastatin (LIPITOR) 80 MG tablet TAKE 1 TAB DAILY AT 6 PM. PLEASE MAKE OVERDUE APPT WITH DR. Clifton James BEFORE ANYMORE REFILLS. 15 tablet 0   carvedilol (COREG) 6.25 MG tablet Take 1 tablet (6.25 mg total) by mouth 2 (two) times daily. Please  schedule yearly appointment for future refills. Thank you 60 tablet 0   nitroGLYCERIN (NITROSTAT) 0.4 MG SL tablet Place 1 tablet (0.4 mg total) under the tongue every 5 (five) minutes as needed for chest pain (CP or SOB). 25 tablet 3   Probiotic Product (PROBIOTIC PO) Take 1 capsule by mouth daily.     No current facility-administered medications on file prior to visit.    Review of Systems  Constitutional:  Positive for fatigue. Negative for activity change, appetite change, fever and unexpected weight change.  HENT:  Negative for congestion, rhinorrhea, sore throat and trouble swallowing.   Eyes:  Negative for pain, redness, itching and visual disturbance.  Respiratory:  Negative for cough, chest tightness, shortness of breath and wheezing.   Cardiovascular:  Negative for chest pain and palpitations.  Gastrointestinal:  Negative for abdominal pain, blood in stool, constipation, diarrhea and nausea.  Endocrine: Negative for cold intolerance, heat intolerance, polydipsia and polyuria.  Genitourinary:  Negative for difficulty urinating, dysuria, frequency and urgency.  Musculoskeletal:  Negative for arthralgias, joint swelling and myalgias.  Skin:  Negative for pallor and rash.  Neurological:  Negative for dizziness, tremors, weakness, numbness and headaches.  Hematological:  Negative for adenopathy. Does not bruise/bleed easily.  Psychiatric/Behavioral:  Negative for decreased concentration and dysphoric  mood. The patient is not nervous/anxious.       Objective:   Physical Exam Constitutional:      General: He is not in acute distress.    Appearance: Normal appearance. He is well-developed. He is obese. He is not ill-appearing.  HENT:     Head: Normocephalic and atraumatic.  Eyes:     Conjunctiva/sclera: Conjunctivae normal.     Pupils: Pupils are equal, round, and reactive to light.  Neck:     Thyroid: No thyromegaly.     Vascular: No carotid bruit or JVD.  Cardiovascular:     Rate and Rhythm: Normal rate and regular rhythm.     Heart sounds: Normal heart sounds.    No gallop.  Pulmonary:     Effort: Pulmonary effort is normal. No respiratory distress.     Breath sounds: Normal breath sounds. No wheezing or rales.  Abdominal:     General: Bowel sounds are normal. There is no distension or abdominal bruit.     Palpations: Abdomen is soft. There is no mass.     Tenderness: There is no abdominal tenderness.  Musculoskeletal:     Cervical back: Normal range of motion and neck supple.     Right lower leg: No edema.     Left lower leg: No edema.  Lymphadenopathy:     Cervical: No cervical adenopathy.  Skin:    General: Skin is warm and dry.     Findings: No erythema or rash.  Neurological:     Mental Status: He is alert.     Coordination: Coordination normal.     Deep Tendon Reflexes: Reflexes are normal and symmetric. Reflexes normal.  Psychiatric:        Mood and Affect: Mood normal.          Assessment & Plan:   Problem List Items Addressed This Visit       Cardiovascular and Mediastinum   HYPERTENSION, BENIGN ESSENTIAL    bp in fair control at this time  BP Readings from Last 1 Encounters:  04/14/21 116/72  No changes needed Most recent labs reviewed  Disc lifstyle change with low sodium diet and exercise  Also under care  of cardiology in setting of CAD Plan to continue Takes coreg 6.25 mg bid Amlodipine 5 mg daily  Diovan 80 mg daily Hctz 12.5 mg daily          Relevant Medications   amLODipine (NORVASC) 5 MG tablet   hydrochlorothiazide (HYDRODIURIL) 25 MG tablet   sildenafil (VIAGRA) 50 MG tablet   valsartan (DIOVAN) 80 MG tablet   Other Relevant Orders   Comprehensive metabolic panel   CBC with Differential/Platelet   Lipid panel   TSH   Coronary artery disease involving native coronary artery of native heart without angina pectoris    Continue asa, cardiology f/u  Working to control  bp and cholesterol and glucose  Wt loss enc  No angina        Relevant Medications   amLODipine (NORVASC) 5 MG tablet   hydrochlorothiazide (HYDRODIURIL) 25 MG tablet   sildenafil (VIAGRA) 50 MG tablet   valsartan (DIOVAN) 80 MG tablet     Endocrine   Hyperlipidemia associated with type 2 diabetes mellitus (HCC)    Due for labs today  Aware he has eaten Last LDL 31  Disc goals for lipids and reasons to control them Rev last labs with pt Rev low sat fat diet in detail Plan to continue atorvastatin 80 mg daily        Relevant Medications   metFORMIN (GLUCOPHAGE) 1000 MG tablet   valsartan (DIOVAN) 80 MG tablet   Other Relevant Orders   Lipid panel   Type 2 diabetes mellitus with hyperglycemia, without long-term current use of insulin (HCC) - Primary    Lab Results  Component Value Date   HGBA1C 7.1 (H) 03/24/2020  Pending a1c from today  Per pt good glucose readings at home and getting back to a better diet  Allergic to ace- microalb ordered  Due for eye exam- ref done Nl foot exam  Adv to continue low glycemic diet        Relevant Medications   metFORMIN (GLUCOPHAGE) 1000 MG tablet   valsartan (DIOVAN) 80 MG tablet   Other Relevant Orders   Hemoglobin A1c   Microalbumin / creatinine urine ratio   Ambulatory referral to Ophthalmology     Other   Thrombocytopenia (HCC)    Cbc today  No bleeding /bruising        Class 2 obesity due to excess calories with body mass index (BMI) of 38.0 to 38.9 in adult     Discussed how this problem influences overall health and the risks it imposes  Reviewed plan for weight loss with lower calorie diet (via better food choices and also portion control or program like weight watchers) and exercise building up to or more than 30 minutes 5 days per week including some aerobic activity   Pt continues to work on it       Relevant Medications   metFORMIN (GLUCOPHAGE) 1000 MG tablet

## 2021-04-14 NOTE — Assessment & Plan Note (Signed)
Cbc today  No bleeding /bruising

## 2021-04-26 ENCOUNTER — Telehealth: Payer: Self-pay | Admitting: Cardiovascular Disease

## 2021-04-26 MED ORDER — ATORVASTATIN CALCIUM 80 MG PO TABS: ORAL_TABLET | ORAL | 2 refills | Status: DC

## 2021-04-26 NOTE — Telephone Encounter (Signed)
Pt's medication was sent to pt's pharmacy as requested. Confirmation received.  °

## 2021-04-26 NOTE — Telephone Encounter (Signed)
*  STAT* If patient is at the pharmacy, call can be transferred to refill team.   1. Which medications need to be refilled? (please list name of each medication and dose if known)  atorvastatin (LIPITOR) 80 MG tablet  2. Which pharmacy/location (including street and city if local pharmacy) is medication to be sent to? [id number] - [place], [place] - [address]  3. Do they need a 30 day or 90 day supply? 30 with refills  Patient is scheduled to see Jacolyn Reedy 07/12/21 and will need medication to last until his appt

## 2021-05-03 ENCOUNTER — Telehealth: Payer: Self-pay | Admitting: Family Medicine

## 2021-05-03 NOTE — Telephone Encounter (Addendum)
Pt came in office to drop off blood pressure readings. Placed in provider box 

## 2021-05-04 NOTE — Telephone Encounter (Signed)
BP readings placed in Dr. Royden Purl office in box to review.

## 2021-05-05 MED ORDER — GLIPIZIDE ER 2.5 MG PO TB24: 2.5000 mg | ORAL_TABLET | Freq: Every day | ORAL | 3 refills | Status: DC

## 2021-05-05 NOTE — Telephone Encounter (Signed)
Patient did state he would take the glipizide and if it bottomed his numbers out to where they are too low he would quit taking them and call back for different options.

## 2021-05-05 NOTE — Telephone Encounter (Signed)
Patient called and informed of his labs, patient stated that he understood. Patient stated after receiving Dr. Lucretia Roers recommendations that he needs to add glipizide. Patient stated that he has done this before and it dropped really low. Patient stated if it dropped too low he would quit taking it. Patient stated [facility]

## 2021-05-05 NOTE — Telephone Encounter (Signed)
Januvia may be another option that is less likely to drop glucose too low-please have him check with pharmacy to see if covered and let me know

## 2021-05-05 NOTE — Telephone Encounter (Signed)
Glipizide sent  Let me know how blood sugars are running in a week or so  If low-stop med and also let us know

## 2021-05-05 NOTE — Telephone Encounter (Signed)
It looks like fasting blood glucose levels average 140s or above with a few exceptions  Before evening meal sometimes lower  Please ask how close to optimal diet is these days ? Mid day a wider range  I would like to try him on a small dose of glipizide (he will continue metformin)  He will need to watch glucose closely and monitor for low levels  Let me know if agreeable and what pharmacy

## 2021-05-07 ENCOUNTER — Other Ambulatory Visit: Payer: Self-pay | Admitting: Cardiovascular Disease

## 2021-05-09 NOTE — Telephone Encounter (Signed)
Called patient and his wife answered, due to DPR She was informed that her husband has not started the medication due to them being out of town, she stated that he is picking it up today. She stated that she will have him write down his sugars for a week and let us know what they are.

## 2021-05-09 NOTE — Telephone Encounter (Signed)
Great, thanks

## 2021-07-05 NOTE — Progress Notes (Signed)
Cardiology Office Note    Date:  07/12/2021   ID:  Daniel CharonDavid G Kestner, DOB [birth year], MRN [id number]   PCP:  Judy Pimpleower, Marne A, MD   [facility]  Cardiologist:  Verne Carrowhristopher McAlhany, MD   Advanced Practice Provider:  No care team member to display Electrophysiologist:  None   [id number]}   Chief Complaint  Patient presents with   Follow-up    History of Present Illness:  Daniel [name] is a 55 y.o. male  with history of CAD S/P NSTEMI 03/2016 DES RCA, echo normal LVEF, hypertension, DM, prior CVA.  Chest pain in 2018 negative work-up in the ED patient canceled stress test.  Patient last saw Dr. Clifton JamesMcAlhany 03/2020 and doing well.   Patient comes in for f/u. Builds bridges. No chest pain, dyspnea, dizziness, or presyncope. Walks on treadmill 3 miles 2-3 times/week. Has lost 12 lbs doing low carb diet.     Past Medical History:  Diagnosis Date   Arthritis    CAD (coronary artery disease)    a. NSTEMI: 100% stenosis of the distal RCA --> DES placed 5/17 CLEARED BY CARDIOLOGIST   Chronic low back pain    a. s/p back surgery.   CVA (cerebral infarction)    a. 02/2015 dyarthria 2/2 CVA involving the lateral aspect of the precentral gyrus.   DM2 (diabetes mellitus, type 2) (HCC)    a. pre-diabetic in the past. b. A1c 03/2016 elevated to 7.9.   ETOH abuse    GERD (gastroesophageal reflux disease)    History of echocardiogram    a. Mild LVH, EF 55-60%, Definity contrast used-LV wall motion could not be adequately assessed   History of non-ST elevation myocardial infarction (NSTEMI) 03/2016   a. PCI: 3.5 x 24 mm Promus Premier DES to distal RCA   History of tobacco abuse    Hypertension    a. 02/2015 echo: 55-65%, trace TR/MR. b. 03/2016: echo with EF of 55-60%.    Myocardial infarction [facility](HCC)    Obesity    Ruptured disk    Stroke [facility](HCC)    2016    Past Surgical History:  Procedure Laterality Date   BACK SURGERY  2000   ruptured disk, L-S   CARDIAC  CATHETERIZATION  05/2004   minimal CAD   CARDIAC CATHETERIZATION N/A 03/10/2016   Procedure: Left Heart Cath and Coronary Angiography;  Surgeon: Kathleene Hazelhristopher D McAlhany, MD;  [facility];  Service: Cardiovascular;  Laterality: N/A;   CARDIAC CATHETERIZATION N/A 03/10/2016   Procedure: Coronary Stent Intervention;  Surgeon: Kathleene Hazelhristopher D McAlhany, MD;  [facility];  Service: Cardiovascular;  Laterality: N/A;   CATARACT EXTRACTION W/PHACO Left 07/12/2016   Procedure: CATARACT EXTRACTION PHACO AND INTRAOCULAR LENS PLACEMENT (IOC);  Surgeon: Nevada CraneBradley Mark King, MD;  Location: [facility] ORS;  Service: Ophthalmology;  Laterality: Left;  US  00:50 AP% 9.2 CDE 4.63 fluid pack lot # [id number]   CORONARY ANGIOPLASTY     STENT 5/17   TOTAL HIP ARTHROPLASTY Left 06/03/2019   Procedure: TOTAL HIP ARTHROPLASTY;  Surgeon: Donato HeinzHooten, James P, MD;  Location: [facility] ORS;  Service: Orthopedics;  Laterality: Left;   TOTAL HIP ARTHROPLASTY Right 03/30/2020   Procedure: TOTAL HIP ARTHROPLASTY;  Surgeon: Donato HeinzHooten, James P, MD;  Location: [facility] ORS;  Service: Orthopedics;  Laterality: Right;  posterior     Current Medications: Current Meds  Medication Sig   amLODipine (NORVASC) 5 MG tablet Take 1 tablet (5 mg total) by mouth  daily.   aspirin EC 81 MG tablet Take 81 mg by mouth daily. Swallow whole.   atorvastatin (LIPITOR) 80 MG tablet TAKE 1 TAB DAILY AT 6 PM. Please keep upcoming appt in September 2022 before anymore refills. Thank you   carvedilol (COREG) 6.25 MG tablet TAKE 1 TABLET (6.25 MG TOTAL) BY MOUTH 2 (TWO) TIMES DAILY. PLEASE SCHEDULE YEARLY APPOINTMENT FOR FUTURE REFILLS. THANK YOU   glipiZIDE (GLUCOTROL XL) 2.5 MG 24 hr tablet Take 1 tablet (2.5 mg total) by mouth daily with breakfast.   hydrochlorothiazide (HYDRODIURIL) 25 MG tablet Take 0.5 tablets (12.5 mg total) by mouth daily.   metFORMIN (GLUCOPHAGE) 1000 MG tablet Take 1 tablet (1,000 mg total) by mouth 2 (two) times daily with a  meal.   nitroGLYCERIN (NITROSTAT) 0.4 MG SL tablet Place 1 tablet (0.4 mg total) under the tongue every 5 (five) minutes as needed for chest pain (CP or SOB).   Probiotic Product (PROBIOTIC PO) Take 1 capsule by mouth daily.   sildenafil (VIAGRA) 50 MG tablet Take 1 tablet (50 mg total) by mouth daily as needed. Take one by mouth once daily as directed as needed 30 minutes before sexual activity.   valsartan (DIOVAN) 80 MG tablet Take 1 tablet (80 mg total) by mouth daily.     Allergies:   Ace inhibitors   Social History   Socioeconomic History   Marital status: Married    Spouse name: Not on file   Number of children: 1   Years of education: Not on file   Highest education level: Not on file  Occupational History   Occupation: Haematologist  Tobacco Use   Smoking status: Former    Types: Cigarettes    Quit date: 01/18/2015    Years since quitting: 6.4   Smokeless tobacco: Never  Vaping Use   Vaping Use: Never used  Substance and Sexual Activity   Alcohol use: Yes    Alcohol/week: 12.0 standard drinks    Types: 12 Cans of beer per week   Drug use: No   Sexual activity: Yes    Partners: Female  Other Topics Concern   Not on file  Social History Narrative   Lives in [place] with wife.  Does not routinely exercise.   Social Determinants of Health   Financial Resource Strain: Not on file  Food Insecurity: Not on file  Transportation Needs: Not on file  Physical Activity: Not on file  Stress: Not on file  Social Connections: Not on file     Family History:  The patient's  family history includes Cancer in his maternal grandmother; Dementia in his mother; Heart disease in his father; Heart failure in his paternal grandfather; Hypertension in his father.   ROS:   Please see the history of present illness.    ROS All other systems reviewed and are negative.   PHYSICAL EXAM:   VS:  BP 124/72   Pulse 62   Ht  (1.778 m)   Wt 273 lb 9.6 oz (124.1 kg)   SpO2 94%    BMI 39.26 kg/m   Physical Exam  GEN: Obese, in no acute distress  Neck: no JVD, carotid bruits, or masses Cardiac:RRR; no murmurs, rubs, or gallops  Respiratory:  clear to auscultation bilaterally, normal work of breathing GI: soft, nontender, nondistended, + BS Ext: without cyanosis, clubbing, or edema, Good distal pulses bilaterally Neuro:  Alert and Oriented x 3 Psych: euthymic mood, full affect  Wt Readings from Last 3  Encounters:  07/12/21 273 lb 9.6 oz (124.1 kg)  04/14/21 269 lb 6 oz (122.2 kg)  03/30/20 270 lb (122.5 kg)      Studies/Labs Reviewed:   EKG:  EKG is  ordered today.  The ekg ordered today demonstrates normal sinus rhythm, normal EKG  Recent Labs: 04/14/2021: ALT 67; BUN 19; Creatinine, Ser 0.98; Hemoglobin 14.1; Platelets 119.0; Potassium 4.4; Sodium 141; TSH 2.31   Lipid Panel    Component Value Date/Time   CHOL 83 04/14/2021 1238   CHOL 150 02/27/2015 0645   TRIG 57.0 04/14/2021 1238   TRIG 98 02/27/2015 0645   HDL 29.50 (L) 04/14/2021 1238   HDL 34 (L) 02/27/2015 0645   CHOLHDL 3 04/14/2021 1238   VLDL 11.4 04/14/2021 1238   VLDL 20 02/27/2015 0645   LDLCALC 42 04/14/2021 1238   LDLCALC 96 02/27/2015 0645    Additional studies/ records that were reviewed today include:  Cardiac cath May 2017: Conclusion    Prox RCA lesion, 30% stenosed. Dist RCA lesion, 100% stenosed. Post intervention, there is a 0% residual stenosis.   1. NSTEMI secondary to occluded distal RCA 2. Successful PTCA/DES of the distal RCA 3. No obstructive disease in the LAD or Circumflex 4. Mild LV systolic dysfunction.     Echo May 2017: Procedure narrative: Transthoracic echocardiography. Image   quality was adequate. The study was technically difficult, as a   result of poor sound wave transmission. Intravenous contrast   (Definity) was administered. - Left ventricle: The cavity size was mildly dilated. Wall   thickness was increased in a pattern of mild LVH.  Systolic   function was normal. The estimated ejection fraction was in the   range of 55% to 60%. Images were inadequate for LV wall motion   assessment. The study is not technically sufficient to allow   evaluation of LV diastolic function. - Left atrium: The atrium was normal in size. - Inferior vena cava: The vessel was normal in size. The   respirophasic diameter changes were in the normal range (>= 50%),   consistent with normal central venous pressure.     Risk Assessment/Calculations:         ASSESSMENT:    1. Coronary artery disease involving native coronary artery of native heart without angina pectoris   2. Essential hypertension   3. Type 2 diabetes mellitus with hyperglycemia, without long-term current use of insulin (HCC)   4. Pure hypercholesterolemia   5. H/O: CVA (cerebrovascular accident)   6. Class 2 severe obesity due to excess calories with serious comorbidity and body mass index (BMI) of 38.0 to 38.9 in adult [facility])      PLAN:  In order of problems listed above:  CAD status post NSTEMI 03/2016 treated with DES to the RCA, normal LVEF on echo no angina.  Continue aspirin 81 mg once daily, carvedilol, Lipitor and Diovan  Hypertension blood pressure well controlled  Diabetes mellitus A1c was 7.6.  Patient is working on his diet and exercise.  Hyperlipidemia on statin LDL 42 04/2021  History of CVA on aspirin  Obesity patient has lost 12 pounds so far on a low-carb diet.  Walking 3 miles on the treadmill 2 to 3 days a week.  Continue exercise and weight loss.  Shared Decision Making/Informed Consent        Medication Adjustments/Labs and Tests Ordered: Current medicines are reviewed at length with the patient today.  Concerns regarding medicines are outlined above.  Medication changes, Labs  and Tests ordered today are listed in the Patient Instructions below. Patient Instructions  Medication Instructions:  Your physician recommends that you continue  on your current medications as directed. Please refer to the Current Medication list given to you today.  *If you need a refill on your cardiac medications before your next appointment, please call your pharmacy*   Lab Work: NONE If you have labs (blood work) drawn today and your tests are completely normal, you will receive your results only by: MyChart Message (if you have MyChart) OR A paper copy in the mail If you have any lab test that is abnormal or we need to change your treatment, we will call you to review the results.   Testing/Procedures: NONE   Follow-Up: At [facility], you and your health needs are our priority.  As part of our continuing mission to provide you with exceptional heart care, we have created designated Provider Care Teams.  These Care Teams include your primary Cardiologist (physician) and Advanced Practice Providers (APPs -  Physician Assistants and Nurse Practitioners) who all work together to provide you with the care you need, when you need it.  We recommend signing up for the patient portal called "MyChart".  Sign up information is provided on this After Visit Summary.  MyChart is used to connect with patients for Virtual Visits (Telemedicine).  Patients are able to view lab/test results, encounter notes, upcoming appointments, etc.  Non-urgent messages can be sent to your provider as well.   To learn more about what you can do with MyChart, go to [email or web address].    Your next appointment:   1 year(s)  The format for your next appointment:   In Person  Provider:   You may see Verne Carrow, MD or one of the following Advanced Practice Providers on your designated Care Team:   Ronie Spies, PA-C Jacolyn Reedy, PA-C   Other Instructions Exercising to Stay Healthy To become healthy and stay healthy, it is recommended that you do moderate-intensity and vigorous-intensity exercise. You can tell that you are exercising at a moderate  intensity if your heart starts beating faster and you start breathing faster but can still hold a conversation. You can tell that you are exercising at a vigorous intensity if you are breathing much harder and faster and cannot hold a conversation while exercising. How can exercise benefit me? Exercising regularly is important. It has many health benefits, such as: Improving overall fitness, flexibility, and endurance. Increasing bone density. Helping with weight control. Decreasing body fat. Increasing muscle strength and endurance. Reducing stress and tension, anxiety, depression, or anger. Improving overall health. What guidelines should I follow while exercising? Before you start a new exercise program, talk with your health care provider. Do not exercise so much that you hurt yourself, feel dizzy, or get very short of breath. Wear comfortable clothes and wear shoes with good support. Drink plenty of water while you exercise to prevent dehydration or heat stroke. Work out until your breathing and your heartbeat get faster (moderate intensity). How often should I exercise? Choose an activity that you enjoy, and set realistic goals. Your health care provider can help you make an activity plan that is individually designed and works best for you. Exercise regularly as told by your health care provider. This may include: Doing strength training two times a week, such as: Lifting weights. Using resistance bands. Push-ups. Sit-ups. Yoga. Doing a certain intensity of exercise for a given amount of time. Choose from these  options: A total of 150 minutes of moderate-intensity exercise every week. A total of 75 minutes of vigorous-intensity exercise every week. A mix of moderate-intensity and vigorous-intensity exercise every week. Children, pregnant women, people who have not exercised regularly, people who are overweight, and older adults may need to talk with a health care provider about  what activities are safe to perform. If you have a medical condition, be sure to talk with your health care provider before you start a new exercise program. What are some exercise ideas? Moderate-intensity exercise ideas include: Walking 1 mile (1.6 km) in about 15 minutes. Biking. Hiking. Golfing. Dancing. Water aerobics. Vigorous-intensity exercise ideas include: Walking 4.5 miles (7.2 km) or more in about 1 hour. Jogging or running 5 miles (8 km) in about 1 hour. Biking 10 miles (16.1 km) or more in about 1 hour. Lap swimming. Roller-skating or in-line skating. Cross-country skiing. Vigorous competitive sports, such as football, basketball, and soccer. Jumping rope. Aerobic dancing. What are some everyday activities that can help me get exercise? Yard work, such as: Child psychotherapist. Raking and bagging leaves. Washing your car. Pushing a stroller. Shoveling snow. Gardening. Washing windows or floors. How can I be more active in my day-to-day activities? Use stairs instead of an elevator. Take a walk during your lunch break. If you drive, park your car farther away from your work or school. If you take public transportation, get off one stop early and walk the rest of the way. Stand up or walk around during all of your indoor phone calls. Get up, stretch, and walk around every 30 minutes throughout the day. Enjoy exercise with a friend. Support to continue exercising will help you keep a regular routine of activity. Where to find more information You can find more information about exercising to stay healthy from: U.S. Department of Health and Human Services: ThisPath.fi Centers for Disease Control and Prevention (CDC): FootballExhibition.com.br Summary Exercising regularly is important. It will improve your overall fitness, flexibility, and endurance. Regular exercise will also improve your overall health. It can help you control your weight, reduce stress, and improve your bone  density. Do not exercise so much that you hurt yourself, feel dizzy, or get very short of breath. Before you start a new exercise program, talk with your health care provider. This information is not intended to replace advice given to you by your health care provider. Make sure you discuss any questions you have with your health care provider. Document Revised: 02/17/2021 Document Reviewed: 02/17/2021 Elsevier Patient Education  [address].     Signed, Jacolyn Reedy, [place]  07/12/2021 12:34 PM    [facility] [address] [place], [place], [place]  [address] Phone: [phone]; Fax: [phone]

## 2021-07-10 ENCOUNTER — Other Ambulatory Visit: Payer: Self-pay | Admitting: Cardiovascular Disease

## 2021-07-12 ENCOUNTER — Other Ambulatory Visit: Payer: Self-pay

## 2021-07-12 ENCOUNTER — Ambulatory Visit: Payer: BC Managed Care – PPO | Admitting: Physician Assistant

## 2021-07-12 ENCOUNTER — Encounter: Payer: Self-pay | Admitting: Physician Assistant

## 2021-07-12 VITALS — BP 124/72 | HR 62 | Ht 70.0 in | Wt 273.6 lb

## 2021-07-12 DIAGNOSIS — E7800 Pure hypercholesterolemia, unspecified: Secondary | ICD-10-CM | POA: Diagnosis not present

## 2021-07-12 DIAGNOSIS — Z6838 Body mass index (BMI) 38.0-38.9, adult: Secondary | ICD-10-CM

## 2021-07-12 DIAGNOSIS — I2510 Atherosclerotic heart disease of native coronary artery without angina pectoris: Secondary | ICD-10-CM

## 2021-07-12 DIAGNOSIS — E1165 Type 2 diabetes mellitus with hyperglycemia: Secondary | ICD-10-CM | POA: Diagnosis not present

## 2021-07-12 DIAGNOSIS — I10 Essential (primary) hypertension: Secondary | ICD-10-CM | POA: Diagnosis not present

## 2021-07-12 DIAGNOSIS — Z8673 Personal history of transient ischemic attack (TIA), and cerebral infarction without residual deficits: Secondary | ICD-10-CM

## 2021-07-12 NOTE — Patient Instructions (Signed)
Medication Instructions:  Your physician recommends that you continue on your current medications as directed. Please refer to the Current Medication list given to you today.  *If you need a refill on your cardiac medications before your next appointment, please call your pharmacy*   Lab Work: NONE If you have labs (blood work) drawn today and your tests are completely normal, you will receive your results only by: MyChart Message (if you have MyChart) OR A paper copy in the mail If you have any lab test that is abnormal or we need to change your treatment, we will call you to review the results.   Testing/Procedures: NONE   Follow-Up: At [facility], you and your health needs are our priority.  As part of our continuing mission to provide you with exceptional heart care, we have created designated Provider Care Teams.  These Care Teams include your primary Cardiologist (physician) and Advanced Practice Providers (APPs -  Physician Assistants and Nurse Practitioners) who all work together to provide you with the care you need, when you need it.  We recommend signing up for the patient portal called "MyChart".  Sign up information is provided on this After Visit Summary.  MyChart is used to connect with patients for Virtual Visits (Telemedicine).  Patients are able to view lab/test results, encounter notes, upcoming appointments, etc.  Non-urgent messages can be sent to your provider as well.   To learn more about what you can do with MyChart, go to [email or web address].    Your next appointment:   1 year(s)  The format for your next appointment:   In Person  Provider:   You may see Verne Carrow, MD or one of the following Advanced Practice Providers on your designated Care Team:   Ronie Spies, PA-C Jacolyn Reedy, PA-C   Other Instructions Exercising to Stay Healthy To become healthy and stay healthy, it is recommended that you do moderate-intensity and  vigorous-intensity exercise. You can tell that you are exercising at a moderate intensity if your heart starts beating faster and you start breathing faster but can still hold a conversation. You can tell that you are exercising at a vigorous intensity if you are breathing much harder and faster and cannot hold a conversation while exercising. How can exercise benefit me? Exercising regularly is important. It has many health benefits, such as: Improving overall fitness, flexibility, and endurance. Increasing bone density. Helping with weight control. Decreasing body fat. Increasing muscle strength and endurance. Reducing stress and tension, anxiety, depression, or anger. Improving overall health. What guidelines should I follow while exercising? Before you start a new exercise program, talk with your health care provider. Do not exercise so much that you hurt yourself, feel dizzy, or get very short of breath. Wear comfortable clothes and wear shoes with good support. Drink plenty of water while you exercise to prevent dehydration or heat stroke. Work out until your breathing and your heartbeat get faster (moderate intensity). How often should I exercise? Choose an activity that you enjoy, and set realistic goals. Your health care provider can help you make an activity plan that is individually designed and works best for you. Exercise regularly as told by your health care provider. This may include: Doing strength training two times a week, such as: Lifting weights. Using resistance bands. Push-ups. Sit-ups. Yoga. Doing a certain intensity of exercise for a given amount of time. Choose from these options: A total of 150 minutes of moderate-intensity exercise every week. A total of 75  minutes of vigorous-intensity exercise every week. A mix of moderate-intensity and vigorous-intensity exercise every week. Children, pregnant women, people who have not exercised regularly, people who are  overweight, and older adults may need to talk with a health care provider about what activities are safe to perform. If you have a medical condition, be sure to talk with your health care provider before you start a new exercise program. What are some exercise ideas? Moderate-intensity exercise ideas include: Walking 1 mile (1.6 km) in about 15 minutes. Biking. Hiking. Golfing. Dancing. Water aerobics. Vigorous-intensity exercise ideas include: Walking 4.5 miles (7.2 km) or more in about 1 hour. Jogging or running 5 miles (8 km) in about 1 hour. Biking 10 miles (16.1 km) or more in about 1 hour. Lap swimming. Roller-skating or in-line skating. Cross-country skiing. Vigorous competitive sports, such as football, basketball, and soccer. Jumping rope. Aerobic dancing. What are some everyday activities that can help me get exercise? Yard work, such as: Child psychotherapist. Raking and bagging leaves. Washing your car. Pushing a stroller. Shoveling snow. Gardening. Washing windows or floors. How can I be more active in my day-to-day activities? Use stairs instead of an elevator. Take a walk during your lunch break. If you drive, park your car farther away from your work or school. If you take public transportation, get off one stop early and walk the rest of the way. Stand up or walk around during all of your indoor phone calls. Get up, stretch, and walk around every 30 minutes throughout the day. Enjoy exercise with a friend. Support to continue exercising will help you keep a regular routine of activity. Where to find more information You can find more information about exercising to stay healthy from: U.S. Department of Health and Human Services: ThisPath.fi Centers for Disease Control and Prevention (CDC): FootballExhibition.com.br Summary Exercising regularly is important. It will improve your overall fitness, flexibility, and endurance. Regular exercise will also improve your overall  health. It can help you control your weight, reduce stress, and improve your bone density. Do not exercise so much that you hurt yourself, feel dizzy, or get very short of breath. Before you start a new exercise program, talk with your health care provider. This information is not intended to replace advice given to you by your health care provider. Make sure you discuss any questions you have with your health care provider. Document Revised: 02/17/2021 Document Reviewed: 02/17/2021 Elsevier Patient Education  2022 [employer].

## 2021-08-01 ENCOUNTER — Telehealth: Payer: Self-pay | Admitting: Cardiovascular Disease

## 2021-08-01 ENCOUNTER — Other Ambulatory Visit: Payer: Self-pay | Admitting: Cardiovascular Disease

## 2021-08-01 MED ORDER — ATORVASTATIN CALCIUM 80 MG PO TABS: ORAL_TABLET | ORAL | 3 refills | Status: DC

## 2021-08-01 MED ORDER — CARVEDILOL 6.25 MG PO TABS: 6.2500 mg | ORAL_TABLET | Freq: Two times a day (BID) | ORAL | 3 refills | Status: DC

## 2021-08-01 NOTE — Telephone Encounter (Signed)
Received call from operator from patient who is at the pharmacy to pick up prescriptions and they are not ready. I reviewed patient's chart and apologized to patient for the delay. I advised that I will send refills of atorvastatin and carvedilol as previously ordered at office visit on 9/7. Patient verbalized understanding and thanked me for my assistance.

## 2021-08-01 NOTE — Telephone Encounter (Signed)
Patient said that he has received medication yet from seeing Dr. In september

## 2021-09-02 ENCOUNTER — Other Ambulatory Visit: Payer: Self-pay | Admitting: Family Medicine

## 2021-10-11 DIAGNOSIS — E119 Type 2 diabetes mellitus without complications: Secondary | ICD-10-CM | POA: Diagnosis not present

## 2021-10-11 LAB — HM DIABETES EYE EXAM

## 2021-10-12 DIAGNOSIS — H2511 Age-related nuclear cataract, right eye: Secondary | ICD-10-CM | POA: Diagnosis not present

## 2021-10-17 ENCOUNTER — Encounter: Payer: Self-pay | Admitting: Ophthalmology

## 2021-10-19 NOTE — Discharge Instructions (Signed)

## 2021-10-23 ENCOUNTER — Encounter: Payer: Self-pay | Admitting: Ophthalmology

## 2021-10-23 ENCOUNTER — Encounter
Admission: RE | Disposition: A | Discharge status: Home / Self Care | Payer: Self-pay | Source: Home / Self Care | Attending: Ophthalmology

## 2021-10-23 ENCOUNTER — Other Ambulatory Visit: Payer: Self-pay

## 2021-10-23 ENCOUNTER — Ambulatory Visit
Admission: RE | Admit: 2021-10-23 | Discharge: 2021-10-23 | Disposition: A | Discharge status: Home / Self Care | Payer: BC Managed Care – PPO | Attending: Ophthalmology | Admitting: Ophthalmology

## 2021-10-23 ENCOUNTER — Ambulatory Visit: Payer: BC Managed Care – PPO | Admitting: Anesthesiology

## 2021-10-23 DIAGNOSIS — Z87891 Personal history of nicotine dependence: Secondary | ICD-10-CM | POA: Diagnosis not present

## 2021-10-23 DIAGNOSIS — H25811 Combined forms of age-related cataract, right eye: Secondary | ICD-10-CM | POA: Diagnosis not present

## 2021-10-23 DIAGNOSIS — E1136 Type 2 diabetes mellitus with diabetic cataract: Secondary | ICD-10-CM | POA: Insufficient documentation

## 2021-10-23 DIAGNOSIS — I2510 Atherosclerotic heart disease of native coronary artery without angina pectoris: Secondary | ICD-10-CM | POA: Insufficient documentation

## 2021-10-23 DIAGNOSIS — H2511 Age-related nuclear cataract, right eye: Secondary | ICD-10-CM | POA: Insufficient documentation

## 2021-10-23 DIAGNOSIS — Z9861 Coronary angioplasty status: Secondary | ICD-10-CM | POA: Insufficient documentation

## 2021-10-23 DIAGNOSIS — Z8673 Personal history of transient ischemic attack (TIA), and cerebral infarction without residual deficits: Secondary | ICD-10-CM | POA: Diagnosis not present

## 2021-10-23 DIAGNOSIS — I252 Old myocardial infarction: Secondary | ICD-10-CM | POA: Insufficient documentation

## 2021-10-23 DIAGNOSIS — Z6839 Body mass index (BMI) 39.0-39.9, adult: Secondary | ICD-10-CM | POA: Insufficient documentation

## 2021-10-23 DIAGNOSIS — I10 Essential (primary) hypertension: Secondary | ICD-10-CM | POA: Insufficient documentation

## 2021-10-23 HISTORY — PX: CATARACT EXTRACTION W/PHACO: SHX586

## 2021-10-23 LAB — GLUCOSE, CAPILLARY
Glucose-Capillary: 245 mg/dL — ABNORMAL HIGH (ref 70–99)
Glucose-Capillary: 205 mg/dL — ABNORMAL HIGH (ref 70–99)

## 2021-10-23 SURGERY — PHACOEMULSIFICATION, CATARACT, WITH IOL INSERTION
Anesthesia: Monitor Anesthesia Care | Site: Eye | Laterality: Right

## 2021-10-23 MED ORDER — MOXIFLOXACIN HCL 0.5 % OP SOLN
OPHTHALMIC | Status: DC | PRN
  Administered 2021-10-23: 0.2 mL via OPHTHALMIC

## 2021-10-23 MED ORDER — SIGHTPATH DOSE#1 SODIUM HYALURONATE 23 MG/ML IO SOLUTION
PREFILLED_SYRINGE | INTRAOCULAR | Status: DC | PRN
  Administered 2021-10-23: 0.6 mL via INTRAOCULAR

## 2021-10-23 MED ORDER — SIGHTPATH DOSE#1 BSS IO SOLN
INTRAOCULAR | Status: DC | PRN
  Administered 2021-10-23: 15 mL

## 2021-10-23 MED ORDER — ARMC OPHTHALMIC DILATING DROPS
1.0000 "application " | OPHTHALMIC | Status: DC | PRN
  Administered 2021-10-23 (×3): 1 via OPHTHALMIC

## 2021-10-23 MED ORDER — LACTATED RINGERS IV SOLN: INTRAVENOUS | Status: DC

## 2021-10-23 MED ORDER — LIDOCAINE HCL (PF) 2 % IJ SOLN
INTRAOCULAR | Status: DC | PRN
  Administered 2021-10-23: 10:00:00 1 mL via INTRAOCULAR

## 2021-10-23 MED ORDER — INSULIN ASPART 100 UNIT/ML IJ SOLN: 3.0000 [IU] | Freq: Once | INTRAMUSCULAR | Status: DC

## 2021-10-23 MED ORDER — MIDAZOLAM HCL 2 MG/2ML IJ SOLN
INTRAMUSCULAR | Status: DC | PRN
  Administered 2021-10-23: 1 mg via INTRAVENOUS

## 2021-10-23 MED ORDER — SIGHTPATH DOSE#1 SODIUM HYALURONATE 10 MG/ML IO SOLUTION
PREFILLED_SYRINGE | INTRAOCULAR | Status: DC | PRN
  Administered 2021-10-23: 0.85 mL via INTRAOCULAR

## 2021-10-23 MED ORDER — OXYCODONE HCL 5 MG/5ML PO SOLN: 5.0000 mg | Freq: Once | ORAL | Status: DC | PRN

## 2021-10-23 MED ORDER — INSULIN LISPRO 100 UNIT/ML IJ SOLN: 3.0000 [IU] | Freq: Once | INTRAMUSCULAR | Status: DC

## 2021-10-23 MED ORDER — FENTANYL CITRATE (PF) 100 MCG/2ML IJ SOLN
INTRAMUSCULAR | Status: DC | PRN
  Administered 2021-10-23: 50 ug via INTRAVENOUS

## 2021-10-23 MED ORDER — TETRACAINE HCL 0.5 % OP SOLN
1.0000 [drp] | OPHTHALMIC | Status: DC | PRN
  Administered 2021-10-23 (×3): 1 [drp] via OPHTHALMIC

## 2021-10-23 MED ORDER — SIGHTPATH DOSE#1 BSS IO SOLN
INTRAOCULAR | Status: DC | PRN
  Administered 2021-10-23: 10:00:00 67 mL via OPHTHALMIC

## 2021-10-23 MED ORDER — OXYCODONE HCL 5 MG PO TABS: 5.0000 mg | ORAL_TABLET | Freq: Once | ORAL | Status: DC | PRN

## 2021-10-23 SURGICAL SUPPLY — 14 items
CANNULA ANT/CHMB 27G (MISCELLANEOUS) IMPLANT
CANNULA ANT/CHMB 27GA (MISCELLANEOUS) ×3 IMPLANT
DISSECTOR HYDRO NUCLEUS 50X22 (MISCELLANEOUS) ×3 IMPLANT
GLOVE SURG GAMMEX PI TX LF 7.5 (GLOVE) ×3 IMPLANT
GLOVE SURG SYN 8.5  E (GLOVE) ×3
GLOVE SURG SYN 8.5 E (GLOVE) ×1 IMPLANT
GLOVE SURG SYN 8.5 PF PI (GLOVE) ×1 IMPLANT
LENS IOL TECNIS EYHANCE 20.0 (Intraocular Lens) ×2 IMPLANT
NDL FILTER BLUNT 18X1 1/2 (NEEDLE) ×1 IMPLANT
NEEDLE FILTER BLUNT 18X 1/2SAF (NEEDLE) ×2
NEEDLE FILTER BLUNT 18X1 1/2 (NEEDLE) ×1 IMPLANT
SYR 3ML LL SCALE MARK (SYRINGE) ×3 IMPLANT
SYR 5ML LL (SYRINGE) ×3 IMPLANT
WATER STERILE IRR 250ML POUR (IV SOLUTION) ×3 IMPLANT

## 2021-10-23 NOTE — Anesthesia Procedure Notes (Signed)
Procedure Name: MAC Date/Time: 10/23/2021 9:42 AM Performed by: Dionne Bucy, CRNA Pre-anesthesia Checklist: Patient identified, Emergency Drugs available, Suction available, Patient being monitored and Timeout performed Patient Re-evaluated:Patient Re-evaluated prior to induction Oxygen Delivery Method: Nasal cannula Placement Confirmation: positive ETCO2

## 2021-10-23 NOTE — Transfer of Care (Signed)
Immediate Anesthesia Transfer of Care Note  Patient: Daniel [name]  Procedure(s) Performed: CATARACT EXTRACTION PHACO AND INTRAOCULAR LENS PLACEMENT (IOC) RIGHT DIABETIC (Right: Eye)  Patient Location: PACU  Anesthesia Type: MAC  Level of Consciousness: awake, alert  and patient cooperative  Airway and Oxygen Therapy: Patient Spontanous Breathing and Patient connected to supplemental oxygen  Post-op Assessment: Post-op Vital signs reviewed, Patient's Cardiovascular Status Stable, Respiratory Function Stable, Patent Airway and No signs of Nausea or vomiting  Post-op Vital Signs: Reviewed and stable  Complications: No notable events documented.

## 2021-10-23 NOTE — H&P (Signed)
[facility]   Primary Care Physician:  Tower, Audrie Gallus, MD Ophthalmologist: Dr. Willey Blade  Pre-Procedure History & Physical: HPI:  Daniel [name] is a 55 y.o. male here for cataract surgery.   Past Medical History:  Diagnosis Date   Arthritis    CAD (coronary artery disease)    a. NSTEMI: 100% stenosis of the distal RCA --> DES placed 5/17 CLEARED BY CARDIOLOGIST   Chronic low back pain    a. s/p back surgery.   CVA (cerebral infarction)    a. 02/2015 dyarthria 2/2 CVA involving the lateral aspect of the precentral gyrus.   DM2 (diabetes mellitus, type 2) (HCC)    a. pre-diabetic in the past. b. A1c 03/2016 elevated to 7.9.   ETOH abuse    GERD (gastroesophageal reflux disease)    History of echocardiogram    a. Mild LVH, EF 55-60%, Definity contrast used-LV wall motion could not be adequately assessed   History of non-ST elevation myocardial infarction (NSTEMI) 03/2016   a. PCI: 3.5 x 24 mm Promus Premier DES to distal RCA   History of tobacco abuse    Hypertension    a. 02/2015 echo: 55-65%, trace TR/MR. b. 03/2016: echo with EF of 55-60%.    Myocardial infarction [facility])    Obesity    Ruptured disk    Stroke [facility])    2016    Past Surgical History:  Procedure Laterality Date   BACK SURGERY  2000   ruptured disk, L-S   CARDIAC CATHETERIZATION  05/2004   minimal CAD   CARDIAC CATHETERIZATION N/A 03/10/2016   Procedure: Left Heart Cath and Coronary Angiography;  Surgeon: Kathleene Hazel, MD;  Location: [facility] INVASIVE CV LAB;  Service: Cardiovascular;  Laterality: N/A;   CARDIAC CATHETERIZATION N/A 03/10/2016   Procedure: Coronary Stent Intervention;  Surgeon: Kathleene Hazel, MD;  Location: [facility] INVASIVE CV LAB;  Service: Cardiovascular;  Laterality: N/A;   CATARACT EXTRACTION W/PHACO Left 07/12/2016   Procedure: CATARACT EXTRACTION PHACO AND INTRAOCULAR LENS PLACEMENT (IOC);  Surgeon: Nevada Crane, MD;  Location: [facility] ORS;  Service: Ophthalmology;   Laterality: Left;  [place]  00:50 AP% 9.2 CDE 4.63 fluid pack lot # [id number]   CORONARY ANGIOPLASTY     STENT 5/17   TOTAL HIP ARTHROPLASTY Left 06/03/2019   Procedure: TOTAL HIP ARTHROPLASTY;  Surgeon: Donato Heinz, MD;  Location: [facility] ORS;  Service: Orthopedics;  Laterality: Left;   TOTAL HIP ARTHROPLASTY Right 03/30/2020   Procedure: TOTAL HIP ARTHROPLASTY;  Surgeon: Donato Heinz, MD;  Location: [facility] ORS;  Service: Orthopedics;  Laterality: Right;  posterior     Prior to Admission medications   Medication Sig Start Date End Date Taking? Authorizing Provider  amLODipine (NORVASC) 5 MG tablet Take 1 tablet (5 mg total) by mouth daily. 04/14/21  Yes Tower, Audrie Gallus, MD  aspirin EC 81 MG tablet Take 81 mg by mouth daily. Swallow whole.   Yes [provider]  atorvastatin (LIPITOR) 80 MG tablet TAKE 1 TAB DAILY AT 6 PM. 08/01/21  Yes Dyann Kief, PA-C  carvedilol (COREG) 6.25 MG tablet Take 1 tablet (6.25 mg total) by mouth 2 (two) times daily. 08/01/21  Yes Dyann Kief, PA-C  glipiZIDE (GLUCOTROL XL) 2.5 MG 24 hr tablet TAKE 1 TABLET BY MOUTH DAILY WITH BREAKFAST. 09/04/21  Yes Tower, Audrie Gallus, MD  hydrochlorothiazide (HYDRODIURIL) 25 MG tablet Take 0.5 tablets (12.5 mg total) by mouth daily. 04/14/21  Yes Tower, [employer]	[place],  MD  metFORMIN (GLUCOPHAGE) 1000 MG tablet Take 1 tablet (1,000 mg total) by mouth 2 (two) times daily with a meal. 04/14/21  Yes Tower, Audrie Gallus, MD  nitroGLYCERIN (NITROSTAT) 0.4 MG SL tablet Place 1 tablet (0.4 mg total) under the tongue every 5 (five) minutes as needed for chest pain (CP or SOB). 03/12/16  Yes Strader, [place] M, PA-C  Probiotic Product (PROBIOTIC PO) Take 1 capsule by mouth daily.   Yes [provider]  sildenafil (VIAGRA) 50 MG tablet Take 1 tablet (50 mg total) by mouth daily as needed. Take one by mouth once daily as directed as needed 30 minutes before sexual activity. 04/14/21  Yes Tower, Audrie Gallus, MD  valsartan (DIOVAN) 80 MG tablet  Take 1 tablet (80 mg total) by mouth daily. 04/14/21  Yes Tower, Audrie Gallus, MD    Allergies as of 10/12/2021 - Review Complete 07/12/2021  Allergen Reaction Noted   Ace inhibitors Cough 07/01/2007    Family History  Problem Relation Age of Onset   Dementia Mother    Hypertension Father    Heart disease Father        MI   Cancer Maternal Grandmother        lung   Heart failure Paternal Grandfather     Social History   Socioeconomic History   Marital status: Married    Spouse name: Not on file   Number of children: 1   Years of education: Not on file   Highest education level: Not on file  Occupational History   Occupation: Haematologist  Tobacco Use   Smoking status: Former    Types: Cigarettes    Quit date: 01/18/2015    Years since quitting: 6.7   Smokeless tobacco: Never  Vaping Use   Vaping Use: Never used  Substance and Sexual Activity   Alcohol use: Yes    Alcohol/week: 12.0 standard drinks    Types: 12 Cans of beer per week    Comment: weekends   Drug use: No   Sexual activity: Yes    Partners: Female  Other Topics Concern   Not on file  Social History Narrative   Lives in [place] with wife.  Does not routinely exercise.   Social Determinants of Health   Financial Resource Strain: Not on file  Food Insecurity: Not on file  Transportation Needs: Not on file  Physical Activity: Not on file  Stress: Not on file  Social Connections: Not on file  Intimate Partner Violence: Not on file    Review of Systems: See HPI, otherwise negative ROS  Physical Exam: BP (!) 153/74    Pulse (!) 56    Temp (!) 97.1 F (36.2 C) (Temporal)    Ht 5\' 10"  (1.778 m)    Wt 125.1 kg    SpO2 97%    BMI 39.59 kg/m  General:   Alert, cooperative in NAD Head:  Normocephalic and atraumatic. Respiratory:  Normal work of breathing. Cardiovascular:  RRR  Impression/Plan: Daniel [name] is here for cataract surgery.  Risks, benefits, limitations, and alternatives regarding  cataract surgery have been reviewed with the patient.  Questions have been answered.  All parties agreeable.   Milda Smart, MD  10/23/2021, 9:33 AM

## 2021-10-23 NOTE — Anesthesia Postprocedure Evaluation (Signed)
Anesthesia Post Note  Patient: Daniel [name]  Procedure(s) Performed: CATARACT EXTRACTION PHACO AND INTRAOCULAR LENS PLACEMENT (IOC) RIGHT DIABETIC (Right: Eye)     Patient location during evaluation: PACU Anesthesia Type: MAC Level of consciousness: awake and alert Pain management: pain level controlled Vital Signs Assessment: post-procedure vital signs reviewed and stable Respiratory status: spontaneous breathing, nonlabored ventilation, respiratory function stable and patient connected to nasal cannula oxygen Cardiovascular status: stable and blood pressure returned to baseline Postop Assessment: no apparent nausea or vomiting Anesthetic complications: no   No notable events documented.  Fidel Levy

## 2021-10-23 NOTE — Op Note (Signed)
OPERATIVE NOTE  Daniel [name] [id number] 10/23/2021   PREOPERATIVE DIAGNOSIS:  Nuclear sclerotic cataract right eye.  H25.11   POSTOPERATIVE DIAGNOSIS:    Nuclear sclerotic cataract right eye.     PROCEDURE:  Phacoemusification with posterior chamber intraocular lens placement of the right eye   LENS:   Implant Name Type Inv. Item Serial No. Manufacturer Lot No. LRB No. Used Action  LENS IOL TECNIS EYHANCE 20.0 - [id number] Intraocular Lens LENS IOL TECNIS EYHANCE 20.0 [id number] [id number]   Right 1 Implanted       Procedure(s) with comments: CATARACT EXTRACTION PHACO AND INTRAOCULAR LENS PLACEMENT (IOC) RIGHT DIABETIC (Right) - Diabetic 1.15 00:16.4  DIB00 +20.0   ULTRASOUND TIME: 0 minutes 16 seconds.  CDE 1.15   SURGEON:  Willey Blade, MD, MPH  ANESTHESIOLOGIST: Anesthesiologist: Orrin Brigham, MD CRNA: Lily Kocher, CRNA   ANESTHESIA:  Topical with tetracaine drops augmented with 1% preservative-free intracameral lidocaine.  ESTIMATED BLOOD LOSS: less than 1 mL.   COMPLICATIONS:  None.   DESCRIPTION OF PROCEDURE:  The patient was identified in the holding room and transported to the operating room and placed in the supine position under the operating microscope.  The right eye was identified as the operative eye and it was prepped and draped in the usual sterile ophthalmic fashion.   A 1.0 millimeter clear-corneal paracentesis was made at the 10:30 position. 0.5 ml of preservative-free 1% lidocaine with epinephrine was injected into the anterior chamber.  The anterior chamber was filled with Healon 5 viscoelastic.  A 2.4 millimeter keratome was used to make a near-clear corneal incision at the 8:00 position.  A curvilinear capsulorrhexis was made with a cystotome and capsulorrhexis forceps.  Balanced salt solution was used to hydrodissect and hydrodelineate the nucleus.   Phacoemulsification was then used in stop and chop fashion to remove the lens nucleus and  epinucleus.  The remaining cortex was then removed using the irrigation and aspiration handpiece. Healon was then placed into the capsular bag to distend it for lens placement.  A lens was then injected into the capsular bag.  The remaining viscoelastic was aspirated.   Wounds were hydrated with balanced salt solution.  The anterior chamber was inflated to a physiologic pressure with balanced salt solution.   Intracameral vigamox 0.1 mL undiluted was injected into the eye and a drop placed onto the ocular surface.  No wound leaks were noted.  The patient was taken to the recovery room in stable condition without complications of anesthesia or surgery  Willey Blade 10/23/2021, 10:05 AM

## 2021-10-23 NOTE — Anesthesia Preprocedure Evaluation (Signed)
Anesthesia Evaluation  Patient identified by MRN, date of birth, ID band Patient awake    Reviewed: NPO status   History of Anesthesia Complications Negative for: history of anesthetic complications  Airway Mallampati: II  TM Distance: >3 FB Neck ROM: full   Comment: beard Dental no notable dental hx.    Pulmonary neg pulmonary ROS, former smoker,    Pulmonary exam normal        Cardiovascular Exercise Tolerance: Good hypertension, + CAD, + Past MI (2017) and + Cardiac Stents (2017)  Normal cardiovascular exam     Neuro/Psych CVA (2016), No Residual Symptoms negative psych ROS   GI/Hepatic GERD  Controlled,(+)     substance abuse  alcohol use,   Endo/Other  diabetesMorbid obesity (bmi 40)  Renal/GU negative Renal ROS  negative genitourinary   Musculoskeletal  (+) Arthritis ,   Abdominal   Peds  Hematology negative hematology ROS (+)   Anesthesia Other Findings ekg: 08/2021: nsr;  cath: 2017:  1. NSTEMI secondary to occluded distal RCA 2. Successful PTCA/DES of the distal RCA 3. No obstructive disease in the LAD or Circumflex 4. Mild LV systolic dysfunction. ;  cards: Dyann Kief, PA-C at 07/12/2021 ;      Reproductive/Obstetrics                             Anesthesia Physical Anesthesia Plan  ASA: 3  Anesthesia Plan:    Post-op Pain Management:    Induction:   PONV Risk Score and Plan: 1 and TIVA  Airway Management Planned:   Additional Equipment:   Intra-op Plan:   Post-operative Plan:   Informed Consent: I have reviewed the patients History and Physical, chart, labs and discussed the procedure including the risks, benefits and alternatives for the proposed anesthesia with the patient or authorized representative who has indicated his/her understanding and acceptance.       Plan Discussed with: CRNA  Anesthesia Plan Comments:         Anesthesia  Quick Evaluation

## 2021-10-24 ENCOUNTER — Encounter: Payer: Self-pay | Admitting: Ophthalmology

## 2021-10-26 DIAGNOSIS — D239 Other benign neoplasm of skin, unspecified: Secondary | ICD-10-CM | POA: Diagnosis not present

## 2021-11-09 ENCOUNTER — Encounter: Payer: Self-pay | Admitting: Family Medicine

## 2022-03-01 ENCOUNTER — Other Ambulatory Visit: Payer: Self-pay | Admitting: Family Medicine

## 2022-03-26 ENCOUNTER — Other Ambulatory Visit: Payer: Self-pay | Admitting: Family Medicine

## 2022-03-31 ENCOUNTER — Other Ambulatory Visit: Payer: Self-pay | Admitting: Family Medicine

## 2022-04-03 NOTE — Telephone Encounter (Signed)
I refilled once  Please schedule  follow up appt with lab prior if possible

## 2022-04-03 NOTE — Telephone Encounter (Signed)
Last OV was on 04/14/21 and no future appts., please advise

## 2022-04-05 NOTE — Telephone Encounter (Signed)
Letter mailed letting pt know ?

## 2022-04-20 ENCOUNTER — Other Ambulatory Visit: Payer: Self-pay | Admitting: Family Medicine

## 2022-04-20 NOTE — Telephone Encounter (Signed)
Refill request Glipizide Last office visit 04/14/21 Last refill 03/27/22 #30 No upcoming appointment scheduled

## 2022-05-02 ENCOUNTER — Other Ambulatory Visit: Payer: Self-pay | Admitting: Family Medicine

## 2022-05-02 NOTE — Telephone Encounter (Signed)
Please call patient and schedule a follow up appointment.

## 2022-05-02 NOTE — Telephone Encounter (Signed)
Last filled on 04/03/22 Last ov 04/14/21   Called and lvm for pat call us back to make an follow up appt.

## 2022-05-02 NOTE — Telephone Encounter (Signed)
Refilled once  Due for f/u/ please schedule

## 2022-05-18 ENCOUNTER — Other Ambulatory Visit: Payer: Self-pay | Admitting: Family

## 2022-05-26 ENCOUNTER — Other Ambulatory Visit: Payer: Self-pay | Admitting: Family Medicine

## 2022-05-28 NOTE — Telephone Encounter (Signed)
Refill request Sildenafil Last refill 05/02/22 #10 Last office visit 04/14/21 Upcoming appointment 07/02/22

## 2022-06-06 DIAGNOSIS — Z0100 Encounter for examination of eyes and vision without abnormal findings: Secondary | ICD-10-CM | POA: Diagnosis not present

## 2022-06-06 DIAGNOSIS — H26491 Other secondary cataract, right eye: Secondary | ICD-10-CM | POA: Diagnosis not present

## 2022-06-08 ENCOUNTER — Other Ambulatory Visit: Payer: Self-pay | Admitting: Family Medicine

## 2022-06-11 ENCOUNTER — Other Ambulatory Visit: Payer: Self-pay

## 2022-06-11 MED ORDER — GLIPIZIDE ER 2.5 MG PO TB24: ORAL_TABLET | ORAL | 0 refills | Status: DC

## 2022-06-11 NOTE — Telephone Encounter (Signed)
Pt call in regarding RX glipiZIDE (GLUCOTROL XL) 2.5 MG 24 hr tablet  .Stated he's all out and Pharmacy has not refill RX . Please Advise

## 2022-07-02 ENCOUNTER — Ambulatory Visit: Payer: BC Managed Care – PPO | Admitting: Family Medicine

## 2022-07-02 ENCOUNTER — Encounter: Payer: Self-pay | Admitting: Family Medicine

## 2022-07-02 VITALS — BP 126/70 | HR 50 | Temp 97.7°F | Ht 70.0 in | Wt 270.5 lb

## 2022-07-02 DIAGNOSIS — E1169 Type 2 diabetes mellitus with other specified complication: Secondary | ICD-10-CM

## 2022-07-02 DIAGNOSIS — E785 Hyperlipidemia, unspecified: Secondary | ICD-10-CM | POA: Diagnosis not present

## 2022-07-02 DIAGNOSIS — E1165 Type 2 diabetes mellitus with hyperglycemia: Secondary | ICD-10-CM | POA: Diagnosis not present

## 2022-07-02 DIAGNOSIS — Z125 Encounter for screening for malignant neoplasm of prostate: Secondary | ICD-10-CM | POA: Diagnosis not present

## 2022-07-02 DIAGNOSIS — I10 Essential (primary) hypertension: Secondary | ICD-10-CM | POA: Diagnosis not present

## 2022-07-02 LAB — COMPREHENSIVE METABOLIC PANEL
ALT: 50 U/L (ref 0–53)
AST: 30 U/L (ref 0–37)
Albumin: 4.2 g/dL (ref 3.5–5.2)
Alkaline Phosphatase: 103 U/L (ref 39–117)
BUN: 16 mg/dL (ref 6–23)
CO2: 26 mEq/L (ref 19–32)
Calcium: 9 mg/dL (ref 8.4–10.5)
Chloride: 100 mEq/L (ref 96–112)
Creatinine, Ser: 0.81 mg/dL (ref 0.40–1.50)
GFR: 97.31 mL/min (ref >60.00)
Glucose, Bld: 142 mg/dL — ABNORMAL HIGH (ref 70–99)
Potassium: 4.2 mEq/L (ref 3.5–5.1)
Sodium: 137 mEq/L (ref 135–145)
Total Bilirubin: 0.6 mg/dL (ref 0.2–1.2)
Total Protein: 6.6 g/dL (ref 6.0–8.3)

## 2022-07-02 LAB — CBC WITH DIFFERENTIAL/PLATELET
Basophils Absolute: 0 10*3/uL (ref 0.0–0.1)
Basophils Relative: 0.5 % (ref 0.0–3.0)
Eosinophils Absolute: 0.5 10*3/uL (ref 0.0–0.7)
Eosinophils Relative: 7.4 % — ABNORMAL HIGH (ref 0.0–5.0)
HCT: 39.8 % (ref 39.0–52.0)
Hemoglobin: 13.4 g/dL (ref 13.0–17.0)
Lymphocytes Relative: 18.6 % (ref 12.0–46.0)
Lymphs Abs: 1.4 10*3/uL (ref 0.7–4.0)
MCHC: 33.6 g/dL (ref 30.0–36.0)
MCV: 92.6 fl (ref 78.0–100.0)
Monocytes Absolute: 0.7 10*3/uL (ref 0.1–1.0)
Monocytes Relative: 9.3 % (ref 3.0–12.0)
Neutro Abs: 4.8 10*3/uL (ref 1.4–7.7)
Neutrophils Relative %: 64.2 % (ref 43.0–77.0)
Platelets: 84 10*3/uL — ABNORMAL LOW (ref 150.0–400.0)
RBC: 4.3 Mil/uL (ref 4.22–5.81)
RDW: 13.4 % (ref 11.5–15.5)
WBC: 7.4 10*3/uL (ref 4.0–10.5)

## 2022-07-02 LAB — LIPID PANEL
Cholesterol: 83 mg/dL (ref 0–200)
HDL: 32.4 mg/dL — ABNORMAL LOW (ref >39.00)
LDL Cholesterol: 35 mg/dL (ref 0–99)
NonHDL: 50.24
Total CHOL/HDL Ratio: 3
Triglycerides: 75 mg/dL (ref 0.0–149.0)
VLDL: 15 mg/dL (ref 0.0–40.0)

## 2022-07-02 LAB — HEMOGLOBIN A1C: Hgb A1c MFr Bld: 7.1 % — ABNORMAL HIGH (ref 4.6–6.5)

## 2022-07-02 LAB — MICROALBUMIN / CREATININE URINE RATIO
Creatinine,U: 80.1 mg/dL
Microalb Creat Ratio: 4.3 mg/g (ref 0.0–30.0)
Microalb, Ur: 3.4 mg/dL — ABNORMAL HIGH (ref 0.0–1.9)

## 2022-07-02 LAB — PSA: PSA: 0.15 ng/mL (ref 0.10–4.00)

## 2022-07-02 LAB — TSH: TSH: 2.95 u[IU]/mL (ref 0.35–5.50)

## 2022-07-02 MED ORDER — SILDENAFIL CITRATE 100 MG PO TABS: 50.0000 mg | ORAL_TABLET | Freq: Every day | ORAL | 11 refills | Status: DC | PRN

## 2022-07-02 NOTE — Assessment & Plan Note (Signed)
Disc goals for lipids and reasons to control them Rev last labs with pt Rev low sat fat diet in detail  Taking atorvastatin 80 mg daily  Lab today for lipids

## 2022-07-02 NOTE — Progress Notes (Signed)
Subjective:    Patient ID: Daniel [name], male    DOB: [birth year], 56 y.o.   MRN: [id number]  HPI Pt presents for f/u of DM2 and chronic medical problems   Wt Readings from Last 3 Encounters:  07/02/22 270 lb 8 oz (122.7 kg)  10/23/21 275 lb 14.4 oz (125.1 kg)  07/12/21 273 lb 9.6 oz (124.1 kg)   38.81 kg/m  Working a ot  Lost 26 lb and then gained it back  Was eating right for a while - was in a contest and that motivated him   Is back to eating better now for 2 months then bad for a month  For the last week he is back on track   Treadmill- exercise  His knees hurt - he also stands and walks all day  Does chair aerobics     HTN withh/o CVA and CAD with NSTEMI in past  bp is stable today  No cp or palpitations or headaches or edema  No side effects to medicines  BP Readings from Last 3 Encounters:  07/02/22 126/70  10/23/21 133/78  07/12/21 124/72    Takes asa 81 mg daily  No new stroke symptoms   Coreg 6.25 mg bid Amlodipine 5 mg daily  Diovan 80 mg daily Hctz 12.5 mg daily   Pulse Readings from Last 3 Encounters:  07/02/22 (!) 50  10/23/21 (!) 58  07/12/21 62   Not dizzy at all   Drinking tomato juice -thinks this helps  Avoiding sweets / stopped ice cream   Routine visit with cardiology is due in oct    DM2 Lab Results  Component Value Date   HGBA1C 7.6 (H) 04/14/2021   Metformin 1000 mg bid   Glipizide xl 2.5 mg daily  Lab Results  Component Value Date   MICROALBUR 5.5 (H) 04/14/2021    Glucose between 100 and 130 in am  and pm  Eye exam utd   Hyperlipidemia Lab Results  Component Value Date   CHOL 83 04/14/2021   HDL 29.50 (L) 04/14/2021   LDLCALC 42 04/14/2021   TRIG 57.0 04/14/2021   CHOLHDL 3 04/14/2021   Taking atorvastatin 80 mg daily   Overdue for labs   ED is worse Wants to go up on viagra dose    Patient Active Problem List   Diagnosis Date Noted   Class 2 obesity due to excess calories with body mass index  (BMI) of 38.0 to 38.9 in adult 04/14/2021   H/O total hip arthroplasty 03/30/2020   Status post total replacement of left hip 06/03/2019   Thrombocytopenia (HCC) 12/31/2018   Left hip pain 12/15/2018   Type 2 diabetes mellitus with hyperglycemia, without long-term current use of insulin (HCC) 07/22/2017   Coronary artery disease involving native coronary artery of native heart without angina pectoris 03/20/2016   History of non-ST elevation myocardial infarction (NSTEMI) 03/20/2016   Hyperlipidemia associated with type 2 diabetes mellitus (HCC) 03/12/2016   NSTEMI (non-ST elevated myocardial infarction) (HCC) 03/10/2016   Prostate cancer screening 02/10/2016   H/O: CVA (cerebrovascular accident) 03/01/2015   Cerebral infarction (HCC) 03/01/2015   Right knee pain 04/06/2014   Benign paroxysmal positional vertigo 03/10/2014   BPH (benign prostatic hyperplasia) 11/16/2013   Routine general medical examination at a health care facility 11/12/2013   B12 deficiency 09/29/2007   ERECTILE DYSFUNCTION 07/01/2007   History of tobacco abuse 07/01/2007   LOW BACK PAIN, CHRONIC 07/01/2007   HYPERTENSION, BENIGN ESSENTIAL 06/03/2007  Past Medical History:  Diagnosis Date   Arthritis    CAD (coronary artery disease)    a. NSTEMI: 100% stenosis of the distal RCA --> DES placed 5/17 CLEARED BY CARDIOLOGIST   Chronic low back pain    a. s/p back surgery.   CVA (cerebral infarction)    a. 02/2015 dyarthria 2/2 CVA involving the lateral aspect of the precentral gyrus.   DM2 (diabetes mellitus, type 2) (HCC)    a. pre-diabetic in the past. b. A1c 03/2016 elevated to 7.9.   ETOH abuse    GERD (gastroesophageal reflux disease)    History of echocardiogram    a. Mild LVH, EF 55-60%, Definity contrast used-LV wall motion could not be adequately assessed   History of non-ST elevation myocardial infarction (NSTEMI) 03/2016   a. PCI: 3.5 x 24 mm Promus Premier DES to distal RCA   History of tobacco abuse     Hypertension    a. 02/2015 echo: 55-65%, trace TR/MR. b. 03/2016: echo with EF of 55-60%.    Myocardial infarction [facility])    Obesity    Ruptured disk    Stroke [facility])    2016   Past Surgical History:  Procedure Laterality Date   BACK SURGERY  2000   ruptured disk, L-S   CARDIAC CATHETERIZATION  05/2004   minimal CAD   CARDIAC CATHETERIZATION N/A 03/10/2016   Procedure: Left Heart Cath and Coronary Angiography;  Surgeon: Kathleene Hazel, MD;  Location: [facility] INVASIVE CV LAB;  Service: Cardiovascular;  Laterality: N/A;   CARDIAC CATHETERIZATION N/A 03/10/2016   Procedure: Coronary Stent Intervention;  Surgeon: Kathleene Hazel, MD;  Location: [facility] INVASIVE CV LAB;  Service: Cardiovascular;  Laterality: N/A;   CATARACT EXTRACTION W/PHACO Left 07/12/2016   Procedure: CATARACT EXTRACTION PHACO AND INTRAOCULAR LENS PLACEMENT (IOC);  Surgeon: Nevada Crane, MD;  Location: [facility] ORS;  Service: Ophthalmology;  Laterality: Left;  [place]  00:50 AP% 9.2 CDE 4.63 fluid pack lot # [id number]   CATARACT EXTRACTION W/PHACO Right 10/23/2021   Procedure: CATARACT EXTRACTION PHACO AND INTRAOCULAR LENS PLACEMENT (IOC) RIGHT DIABETIC;  Surgeon: Nevada Crane, MD;  Location: [facility];  Service: Ophthalmology;  Laterality: Right;  Diabetic 1.15 00:16.4   CORONARY ANGIOPLASTY     STENT 5/17   TOTAL HIP ARTHROPLASTY Left 06/03/2019   Procedure: TOTAL HIP ARTHROPLASTY;  Surgeon: Donato Heinz, MD;  Location: [facility] ORS;  Service: Orthopedics;  Laterality: Left;   TOTAL HIP ARTHROPLASTY Right 03/30/2020   Procedure: TOTAL HIP ARTHROPLASTY;  Surgeon: Donato Heinz, MD;  Location: [facility] ORS;  Service: Orthopedics;  Laterality: Right;  posterior    Social History   Tobacco Use   Smoking status: Former    Types: Cigarettes    Quit date: 01/18/2015    Years since quitting: 7.4   Smokeless tobacco: Never  Vaping Use   Vaping Use: Never used  Substance Use Topics   Alcohol use: Yes     Alcohol/week: 12.0 standard drinks of alcohol    Types: 12 Cans of beer per week    Comment: weekends   Drug use: No   Family History  Problem Relation Age of Onset   Dementia Mother    Hypertension Father    Heart disease Father        MI   Cancer Maternal Grandmother        lung   Heart failure Paternal Grandfather    Allergies  Allergen Reactions   Ace Inhibitors Cough  Current Outpatient Medications on File Prior to Visit  Medication Sig Dispense Refill   amLODipine (NORVASC) 5 MG tablet TAKE 1 TABLET (5 MG TOTAL) BY MOUTH DAILY. 90 tablet 0   aspirin EC 81 MG tablet Take 81 mg by mouth daily. Swallow whole.     atorvastatin (LIPITOR) 80 MG tablet TAKE 1 TAB DAILY AT 6 PM. 90 tablet 3   carvedilol (COREG) 6.25 MG tablet Take 1 tablet (6.25 mg total) by mouth 2 (two) times daily. 180 tablet 3   glipiZIDE (GLUCOTROL XL) 2.5 MG 24 hr tablet TAKE 1 TABLET BY MOUTH EVERY DAY WITH BREAKFAST 30 tablet 0   hydrochlorothiazide (HYDRODIURIL) 25 MG tablet TAKE 1/2 TABLET BY MOUTH EVERY DAY 45 tablet 0   metFORMIN (GLUCOPHAGE) 1000 MG tablet TAKE 1 TABLET (1,000 MG TOTAL) BY MOUTH 2 (TWO) TIMES DAILY WITH A MEAL. 180 tablet 0   nitroGLYCERIN (NITROSTAT) 0.4 MG SL tablet Place 1 tablet (0.4 mg total) under the tongue every 5 (five) minutes as needed for chest pain (CP or SOB). 25 tablet 3   Probiotic Product (PROBIOTIC PO) Take 1 capsule by mouth daily.     valsartan (DIOVAN) 80 MG tablet TAKE 1 TABLET BY MOUTH EVERY DAY 90 tablet 0   No current facility-administered medications on file prior to visit.     Review of Systems  Constitutional:  Positive for fatigue. Negative for activity change, appetite change, fever and unexpected weight change.  HENT:  Negative for congestion, rhinorrhea, sore throat and trouble swallowing.   Eyes:  Negative for pain, redness, itching and visual disturbance.  Respiratory:  Negative for cough, chest tightness, shortness of breath and wheezing.    Cardiovascular:  Negative for chest pain and palpitations.  Gastrointestinal:  Negative for abdominal pain, blood in stool, constipation, diarrhea and nausea.  Endocrine: Negative for cold intolerance, heat intolerance, polydipsia and polyuria.  Genitourinary:  Negative for difficulty urinating, dysuria, frequency and urgency.  Musculoskeletal:  Negative for arthralgias, joint swelling and myalgias.  Skin:  Negative for pallor and rash.  Neurological:  Negative for dizziness, tremors, weakness, numbness and headaches.  Hematological:  Negative for adenopathy. Does not bruise/bleed easily.  Psychiatric/Behavioral:  Negative for decreased concentration and dysphoric mood. The patient is not nervous/anxious.        Objective:   Physical Exam Constitutional:      General: He is not in acute distress.    Appearance: Normal appearance. He is well-developed. He is obese. He is not ill-appearing or diaphoretic.     Comments: Central obesity  HENT:     Head: Normocephalic and atraumatic.     Mouth/Throat:     Mouth: Mucous membranes are moist.  Eyes:     General: No scleral icterus.    Conjunctiva/sclera: Conjunctivae normal.     Pupils: Pupils are equal, round, and reactive to light.  Neck:     Thyroid: No thyromegaly.     Vascular: No carotid bruit or JVD.  Cardiovascular:     Rate and Rhythm: Normal rate and regular rhythm.     Heart sounds: Normal heart sounds.     No gallop.  Pulmonary:     Effort: Pulmonary effort is normal. No respiratory distress.     Breath sounds: Normal breath sounds. No wheezing or rales.  Abdominal:     General: There is no distension or abdominal bruit.     Palpations: Abdomen is soft.  Musculoskeletal:     Cervical back: Normal range of motion  and neck supple.     Right lower leg: No edema.     Left lower leg: No edema.  Lymphadenopathy:     Cervical: No cervical adenopathy.  Skin:    General: Skin is warm and dry.     Coloration: Skin is not  pale.     Findings: No rash.  Neurological:     Mental Status: He is alert.     Cranial Nerves: No cranial nerve deficit.     Coordination: Coordination normal.     Deep Tendon Reflexes: Reflexes are normal and symmetric. Reflexes normal.  Psychiatric:        Mood and Affect: Mood normal.           Assessment & Plan:   Problem List Items Addressed This Visit       Cardiovascular and Mediastinum   HYPERTENSION, BENIGN ESSENTIAL    bp in fair control at this time  BP Readings from Last 1 Encounters:  07/02/22 126/70  No changes needed Most recent labs reviewed  Disc lifstyle change with low sodium diet and exercise  Plan to continue  Coreg 6.25 mg bid Amlodipine 5 mg daily  Diovan 80 mg daily Hctz 12.5 mg daily  Labs today      Relevant Medications   sildenafil (VIAGRA) 100 MG tablet   Other Relevant Orders   TSH   Lipid panel   Comprehensive metabolic panel   CBC with Differential/Platelet     Endocrine   Hyperlipidemia associated with type 2 diabetes mellitus (HCC)    Disc goals for lipids and reasons to control them Rev last labs with pt Rev low sat fat diet in detail  Taking atorvastatin 80 mg daily  Lab today for lipids      Relevant Medications   sildenafil (VIAGRA) 100 MG tablet   Other Relevant Orders   Lipid panel   Type 2 diabetes mellitus with hyperglycemia, without long-term current use of insulin (HCC) - Primary    A1C ordered  Also microalb  At home glucose levels are ok  Has been on /off diet plan (2 good months out of the last 3)  Lab today  Metformin 1000 mg bid Glipizide xl 2.5 mg daily  Nl foot exam Taking arb and statin  Eye exam is utd      Relevant Orders   Hemoglobin A1c   Microalbumin / creatinine urine ratio     Other   Prostate cancer screening    psa added to labs  No voiding changes       Relevant Orders   PSA

## 2022-07-02 NOTE — Patient Instructions (Signed)
Labs today  Take care of yourself   Get a flu shot in the fall   Try to stay on track with low glycemic diet  Try to get most of your carbohydrates from produce (with the exception of white potatoes)  Eat less bread/pasta/rice/snack foods/cereals/sweets and other items from the middle of the grocery store (processed carbs)

## 2022-07-02 NOTE — Assessment & Plan Note (Signed)
A1C ordered  Also microalb  At home glucose levels are ok  Has been on /off diet plan (2 good months out of the last 3)  Lab today  Metformin 1000 mg bid Glipizide xl 2.5 mg daily  Nl foot exam Taking arb and statin  Eye exam is utd

## 2022-07-02 NOTE — Assessment & Plan Note (Signed)
psa added to labs  No voiding changes

## 2022-07-02 NOTE — Assessment & Plan Note (Signed)
bp in fair control at this time  BP Readings from Last 1 Encounters:  07/02/22 126/70   No changes needed Most recent labs reviewed  Disc lifstyle change with low sodium diet and exercise  Plan to continue  Coreg 6.25 mg bid Amlodipine 5 mg daily  Diovan 80 mg daily Hctz 12.5 mg daily  Labs today

## 2022-07-03 ENCOUNTER — Other Ambulatory Visit: Payer: Self-pay | Admitting: Family Medicine

## 2022-07-05 ENCOUNTER — Telehealth: Payer: Self-pay | Admitting: *Deleted

## 2022-07-05 NOTE — Telephone Encounter (Signed)
Left VM requesting pt to call the office back 

## 2022-07-05 NOTE — Telephone Encounter (Signed)
-----   Message from Judy Pimple, MD sent at 07/04/2022  7:45 PM EDT ----- Nothing I know of to help This problem could lead to excessive bleeding or clotting which in the right setting could be life threatening  If he won't see hematology then re check cbc with diff in 1-2 weeks please  If he refuses that please document

## 2022-07-19 DIAGNOSIS — H26491 Other secondary cataract, right eye: Secondary | ICD-10-CM | POA: Diagnosis not present

## 2022-07-22 ENCOUNTER — Other Ambulatory Visit: Payer: Self-pay | Admitting: Family Medicine

## 2022-07-22 ENCOUNTER — Other Ambulatory Visit: Payer: Self-pay | Admitting: Physician Assistant

## 2022-07-27 ENCOUNTER — Encounter: Payer: Self-pay | Admitting: *Deleted

## 2022-07-27 NOTE — Telephone Encounter (Signed)
Multiple VM have been left requesting pt to call office so letter mailed

## 2022-08-15 ENCOUNTER — Other Ambulatory Visit: Payer: Self-pay | Admitting: Cardiovascular Disease

## 2022-08-19 ENCOUNTER — Other Ambulatory Visit: Payer: Self-pay | Admitting: Cardiovascular Disease

## 2022-09-07 ENCOUNTER — Other Ambulatory Visit: Payer: Self-pay | Admitting: Cardiovascular Disease

## 2022-09-25 ENCOUNTER — Other Ambulatory Visit: Payer: Self-pay | Admitting: Cardiovascular Disease

## 2022-10-05 ENCOUNTER — Encounter: Payer: Self-pay | Admitting: Family Medicine

## 2022-10-05 ENCOUNTER — Ambulatory Visit: Payer: BC Managed Care – PPO | Admitting: Family Medicine

## 2022-10-05 VITALS — BP 134/82 | HR 61 | Temp 98.0°F | Ht 70.0 in | Wt 269.1 lb

## 2022-10-05 DIAGNOSIS — J019 Acute sinusitis, unspecified: Secondary | ICD-10-CM | POA: Insufficient documentation

## 2022-10-05 DIAGNOSIS — I10 Essential (primary) hypertension: Secondary | ICD-10-CM | POA: Diagnosis not present

## 2022-10-05 DIAGNOSIS — E1165 Type 2 diabetes mellitus with hyperglycemia: Secondary | ICD-10-CM | POA: Diagnosis not present

## 2022-10-05 DIAGNOSIS — E785 Hyperlipidemia, unspecified: Secondary | ICD-10-CM

## 2022-10-05 DIAGNOSIS — D696 Thrombocytopenia, unspecified: Secondary | ICD-10-CM

## 2022-10-05 DIAGNOSIS — J0100 Acute maxillary sinusitis, unspecified: Secondary | ICD-10-CM

## 2022-10-05 DIAGNOSIS — E1169 Type 2 diabetes mellitus with other specified complication: Secondary | ICD-10-CM

## 2022-10-05 LAB — CBC WITH DIFFERENTIAL/PLATELET
Basophils Absolute: 0.1 10*3/uL (ref 0.0–0.1)
Basophils Relative: 1 % (ref 0.0–3.0)
Eosinophils Absolute: 0.6 10*3/uL (ref 0.0–0.7)
Eosinophils Relative: 7.7 % — ABNORMAL HIGH (ref 0.0–5.0)
HCT: 42.1 % (ref 39.0–52.0)
Hemoglobin: 14.2 g/dL (ref 13.0–17.0)
Lymphocytes Relative: 18.9 % (ref 12.0–46.0)
Lymphs Abs: 1.5 10*3/uL (ref 0.7–4.0)
MCHC: 33.7 g/dL (ref 30.0–36.0)
MCV: 92.7 fl (ref 78.0–100.0)
Monocytes Absolute: 0.9 10*3/uL (ref 0.1–1.0)
Monocytes Relative: 11.2 % (ref 3.0–12.0)
Neutro Abs: 4.8 10*3/uL (ref 1.4–7.7)
Neutrophils Relative %: 61.2 % (ref 43.0–77.0)
Platelets: 105 10*3/uL — ABNORMAL LOW (ref 150.0–400.0)
RBC: 4.54 Mil/uL (ref 4.22–5.81)
RDW: 14.7 % (ref 11.5–15.5)
WBC: 7.9 10*3/uL (ref 4.0–10.5)

## 2022-10-05 LAB — POCT GLYCOSYLATED HEMOGLOBIN (HGB A1C): Hemoglobin A1C: 6.8 % — AB (ref 4.0–5.6)

## 2022-10-05 MED ORDER — AMOXICILLIN-POT CLAVULANATE 875-125 MG PO TABS: 1.0000 | ORAL_TABLET | Freq: Two times a day (BID) | ORAL | 0 refills | Status: DC

## 2022-10-05 NOTE — Assessment & Plan Note (Signed)
Improved Lab Results  Component Value Date   HGBA1C 6.8 (A) 10/05/2022   Enc low glycemic diet  Continue  Metformin 1000 mg bid Glipizide xl 2.5 mg daily   Sent for recent eye exam

## 2022-10-05 NOTE — Assessment & Plan Note (Signed)
Disc goals for lipids and reasons to control them Rev last labs with pt Rev low sat fat diet in detail  LDL of 35 Under card care for CAD Atorvastatin 80 mg daily

## 2022-10-05 NOTE — Progress Notes (Unsigned)
Subjective:    Patient ID: Daniel [name], male    DOB: 1966/05/29, 56 y.o.   MRN: [id number]  HPI Pt presents for 3 mo f/u of DM2 and HTN and lipids Also thrombocytopenia  Also congestion/uri   Wt Readings from Last 3 Encounters:  10/05/22 269 lb 2 oz (122.1 kg)  07/02/22 270 lb 8 oz (122.7 kg)  10/23/21 275 lb 14.4 oz (125.1 kg)   38.62 kg/m  7 weeks of symptoms  Prod of yellow sputum Covid tests neg at home  No wheeze  No sob  Some sinus pressure and green sinus drainage  Cough is improved but still there  He is hoarse    HTN bp is stable today  No cp or palpitations or headaches or edema  No side effects to medicines  BP Readings from Last 3 Encounters:  10/05/22 134/82  07/02/22 126/70  10/23/21 133/78    Pulse Readings from Last 3 Encounters:  10/05/22 61  07/02/22 (!) 50  10/23/21 (!) 58     Coreg 6.25 mg bid Amlodipine 5 mg daily  Diovan 80 mg daily Hctz 12.5 mg daily  Labs today  DM2 Lab Results  Component Value Date   HGBA1C 7.1 (H) 07/02/2022   Metformin 1000 mg bid Glipizide xl 2.5 mg daily   Down to 6.8 today   Thinks he is stable  Wants to loose wt for daughter's wedding in may   Eye exam : just had it / [place] [facility] Dr Edison Pace  Hyperlipidemia Lab Results  Component Value Date   CHOL 83 07/02/2022   HDL 32.40 (L) 07/02/2022   LDLCALC 35 07/02/2022   TRIG 75.0 07/02/2022   CHOLHDL 3 07/02/2022   Atorvastatin 80 mg daily  CAD with NSTEMI in the past   Thrombocytopenia Lab Results  Component Value Date   WBC 7.4 07/02/2022   HGB 13.4 07/02/2022   HCT 39.8 07/02/2022   MCV 92.6 07/02/2022   PLT 84.0 (L) 07/02/2022   This was down significantly  Pt declined hematology referral due to busy schedule  Declined short term lab follow up  No bruising or bleeding or blood clots   Patient Active Problem List   Diagnosis Date Noted   Acute sinusitis 10/05/2022   Class 2 obesity due to excess calories with body mass index  (BMI) of 38.0 to 38.9 in adult 04/14/2021   H/O total hip arthroplasty 03/30/2020   Status post total replacement of left hip 06/03/2019   Thrombocytopenia ([place]) 12/31/2018   Left hip pain 12/15/2018   Type 2 diabetes mellitus with hyperglycemia, without long-term current use of insulin ([place]) 07/22/2017   Coronary artery disease involving native coronary artery of native heart without angina pectoris 03/20/2016   History of non-ST elevation myocardial infarction (NSTEMI) 03/20/2016   Hyperlipidemia associated with type 2 diabetes mellitus ([place]) 03/12/2016   NSTEMI (non-ST elevated myocardial infarction) ([place]) 03/10/2016   Prostate cancer screening 02/10/2016   H/O: CVA (cerebrovascular accident) 03/01/2015   Cerebral infarction ([place]) 03/01/2015   Right knee pain 04/06/2014   Benign paroxysmal positional vertigo 03/10/2014   BPH (benign prostatic hyperplasia) 11/16/2013   Routine general medical examination at a health care facility 11/12/2013   B12 deficiency 09/29/2007   ERECTILE DYSFUNCTION 07/01/2007   History of tobacco abuse 07/01/2007   LOW BACK PAIN, CHRONIC 07/01/2007   HYPERTENSION, BENIGN ESSENTIAL 06/03/2007   Past Medical History:  Diagnosis Date   Arthritis    CAD (coronary artery disease)  a. NSTEMI: 100% stenosis of the distal RCA --> DES placed 5/17 CLEARED BY CARDIOLOGIST   Chronic low back pain    a. s/p back surgery.   CVA (cerebral infarction)    a. 02/2015 dyarthria 2/2 CVA involving the lateral aspect of the precentral gyrus.   DM2 (diabetes mellitus, type 2) ([place])    a. pre-diabetic in the past. b. A1c 03/2016 elevated to 7.9.   ETOH abuse    GERD (gastroesophageal reflux disease)    History of echocardiogram    a. Mild LVH, EF 55-60%, Definity contrast used-LV wall motion could not be adequately assessed   History of non-ST elevation myocardial infarction (NSTEMI) 03/2016   a. PCI: 3.5 x 24 mm Promus Premier DES to distal RCA   History of tobacco abuse     Hypertension    a. 02/2015 echo: 55-65%, trace TR/MR. b. 03/2016: echo with EF of 55-60%.    Myocardial infarction [facility])    Obesity    Ruptured disk    Stroke [facility])    2016   Past Surgical History:  Procedure Laterality Date   BACK SURGERY  2000   ruptured disk, L-S   CARDIAC CATHETERIZATION  05/2004   minimal CAD   CARDIAC CATHETERIZATION N/A 03/10/2016   Procedure: Left Heart Cath and Coronary Angiography;  Surgeon: Burnell Blanks, MD;  Location: [facility];  Service: Cardiovascular;  Laterality: N/A;   CARDIAC CATHETERIZATION N/A 03/10/2016   Procedure: Coronary Stent Intervention;  Surgeon: Burnell Blanks, MD;  Location: [facility] [facility];  Service: Cardiovascular;  Laterality: N/A;   CATARACT EXTRACTION W/PHACO Left 07/12/2016   Procedure: CATARACT EXTRACTION PHACO AND INTRAOCULAR LENS PLACEMENT (IOC);  Surgeon: Eulogio Bear, MD;  Location: [facility];  Service: Ophthalmology;  Laterality: Left;  [place]  00:50 AP% 9.2 CDE 4.63 fluid pack lot # [id number]   CATARACT EXTRACTION W/PHACO Right 10/23/2021   Procedure: CATARACT EXTRACTION PHACO AND INTRAOCULAR LENS PLACEMENT ([place]) RIGHT DIABETIC;  Surgeon: Eulogio Bear, MD;  Location: [place];  Service: Ophthalmology;  Laterality: Right;  Diabetic 1.15 00:16.4   CORONARY ANGIOPLASTY     STENT 5/17   TOTAL HIP ARTHROPLASTY Left 06/03/2019   Procedure: TOTAL HIP ARTHROPLASTY;  Surgeon: Dereck Leep, MD;  Location: [facility];  Service: Orthopedics;  Laterality: Left;   TOTAL HIP ARTHROPLASTY Right 03/30/2020   Procedure: TOTAL HIP ARTHROPLASTY;  Surgeon: Dereck Leep, MD;  Location: [facility];  Service: Orthopedics;  Laterality: Right;  posterior    Social History   Tobacco Use   Smoking status: Former    Types: Cigarettes    Quit date: 01/18/2015    Years since quitting: 7.7   Smokeless tobacco: Never  Vaping Use   Vaping Use: Never used  Substance Use Topics   Alcohol use: Yes     Alcohol/week: 12.0 standard drinks of alcohol    Types: 12 Cans of beer per week    Comment: weekends   Drug use: No   Family History  Problem Relation Age of Onset   Dementia Mother    Hypertension Father    Heart disease Father        MI   Cancer Maternal Grandmother        lung   Heart failure Paternal Grandfather    Allergies  Allergen Reactions   Ace Inhibitors Cough   Current Outpatient Medications on File Prior to Visit  Medication Sig Dispense Refill   amLODipine ([place])  5 MG tablet TAKE 1 TABLET (5 MG TOTAL) BY MOUTH DAILY. 90 tablet 1   aspirin EC 81 MG tablet Take 81 mg by mouth daily. Swallow whole.     atorvastatin (LIPITOR) 80 MG tablet TAKE 1 TABLET BY MOUTH EVERY DAY AT 6PM. 15 tablet 0   carvedilol (COREG) 6.25 MG tablet TAKE 1 TABLET BY MOUTH TWICE A DAY 30 tablet 0   glipiZIDE (GLUCOTROL XL) 2.5 MG 24 hr tablet TAKE 1 TABLET BY MOUTH EVERY DAY WITH BREAKFAST 90 tablet 1   hydrochlorothiazide (HYDRODIURIL) 25 MG tablet TAKE 1/2 TABLET BY MOUTH EVERY DAY 45 tablet 1   metFORMIN (GLUCOPHAGE) 1000 MG tablet TAKE 1 TABLET (1,000 MG TOTAL) BY MOUTH TWICE A DAY WITH FOOD 180 tablet 1   nitroGLYCERIN (NITROSTAT) 0.4 MG SL tablet Place 1 tablet (0.4 mg total) under the tongue every 5 (five) minutes as needed for chest pain (CP or SOB). 25 tablet 3   Probiotic Product (PROBIOTIC PO) Take 1 capsule by mouth daily.     sildenafil (VIAGRA) 100 MG tablet Take 0.5-1 tablets (50-100 mg total) by mouth daily as needed for erectile dysfunction. 5 tablet 11   valsartan (DIOVAN) 80 MG tablet TAKE 1 TABLET BY MOUTH EVERY DAY 90 tablet 1   No current facility-administered medications on file prior to visit.    Review of Systems  Constitutional:  Positive for appetite change. Negative for fatigue and fever.  HENT:  Positive for congestion, ear pain, postnasal drip, rhinorrhea, sinus pressure and sore throat. Negative for nosebleeds.   Eyes:  Negative for pain, redness and itching.   Respiratory:  Positive for cough. Negative for shortness of breath and wheezing.   Cardiovascular:  Negative for chest pain.  Gastrointestinal:  Negative for abdominal pain, diarrhea, nausea and vomiting.  Endocrine: Negative for polyuria.  Genitourinary:  Negative for dysuria, frequency and urgency.  Musculoskeletal:  Negative for arthralgias and myalgias.  Allergic/Immunologic: Negative for immunocompromised state.  Neurological:  Positive for headaches. Negative for dizziness, tremors, syncope, weakness and numbness.  Hematological:  Negative for adenopathy. Does not bruise/bleed easily.  Psychiatric/Behavioral:  Negative for dysphoric mood. The patient is not nervous/anxious.        Objective:   Physical Exam Constitutional:      General: He is not in acute distress.    Appearance: Normal appearance. He is well-developed. He is obese. He is not ill-appearing or diaphoretic.  HENT:     Head: Normocephalic and atraumatic.     Right Ear: External ear normal.     Left Ear: External ear normal.     Mouth/Throat:     Pharynx: No oropharyngeal exudate.  Eyes:     General:        Right eye: No discharge.        Left eye: No discharge.     Conjunctiva/sclera: Conjunctivae normal.     Pupils: Pupils are equal, round, and reactive to light.  Cardiovascular:     Rate and Rhythm: Normal rate and regular rhythm.  Pulmonary:     Effort: Pulmonary effort is normal. No respiratory distress.     Breath sounds: Normal breath sounds. No wheezing or rales.  Chest:     Chest wall: No tenderness.  Musculoskeletal:        General: Normal range of motion.     Cervical back: Normal range of motion and neck supple.  Lymphadenopathy:     Cervical: No cervical adenopathy.  Skin:    General:  Skin is warm and dry.     Findings: No erythema or rash.  Neurological:     Mental Status: He is alert.     Cranial Nerves: No cranial nerve deficit.           Assessment & Plan:   Problem List  Items Addressed This Visit       Cardiovascular and Mediastinum   HYPERTENSION, BENIGN ESSENTIAL    bp in fair control at this time  BP Readings from Last 1 Encounters:  10/05/22 134/82  No changes needed Most recent labs reviewed  Disc lifstyle change with low sodium diet and exercise  Coreg 6.25 mg bid Amlodipine 5 mg daily  Diovan 80 mg daily Hctz 12.5 mg daily         Respiratory   Acute sinusitis    From uri 7 wk ago  Still some prod cough/congestion and facial pressure  Augmentin sent If not imp will f/u  Disc sympt care  Needs more fluids       Relevant Medications   amoxicillin-clavulanate (AUGMENTIN) 875-125 MG tablet     Endocrine   Hyperlipidemia associated with type 2 diabetes mellitus ([place])    Disc goals for lipids and reasons to control them Rev last labs with pt Rev low sat fat diet in detail  LDL of 35 Under card care for CAD Atorvastatin 80 mg daily      Type 2 diabetes mellitus with hyperglycemia, without long-term current use of insulin (HCC) - Primary    Improved Lab Results  Component Value Date   HGBA1C 6.8 (A) 10/05/2022  Enc low glycemic diet  Continue  Metformin 1000 mg bid Glipizide xl 2.5 mg daily   Sent for recent eye exam        Relevant Orders   POCT glycosylated hemoglobin (Hb A1C) (Completed)     Hematopoietic and Hemostatic   Thrombocytopenia (HCC)    Cbc today  Last pl 84 No bruising or bleeding or clots Declines heme referral   Disc risks of this if not treated       Relevant Orders   CBC with Differential/Platelet (Completed)

## 2022-10-05 NOTE — Patient Instructions (Addendum)
Try and cut extra sugar if you can   Try to get most of your carbohydrates from produce (with the exception of white potatoes)  Eat less bread/pasta/rice/snack foods/cereals/sweets and other items from the middle of the grocery store (processed carbs  Use your treadmill when you can  Any extra exercise helps   Lab for cbc /platelets today  If you get extra bruising or bleeding or symptoms of a clot let us know  Take augmentin for sinus infection /lung infection  Let us know if you don't improve Drink lots of water

## 2022-10-05 NOTE — Assessment & Plan Note (Signed)
From uri 7 wk ago  Still some prod cough/congestion and facial pressure  Augmentin sent If not imp will f/u  Disc sympt care  Needs more fluids

## 2022-10-05 NOTE — Assessment & Plan Note (Signed)
Cbc today  Last pl 84 No bruising or bleeding or clots Declines heme referral   Disc risks of this if not treated

## 2022-10-05 NOTE — Assessment & Plan Note (Signed)
bp in fair control at this time  BP Readings from Last 1 Encounters:  10/05/22 134/82   No changes needed Most recent labs reviewed  Disc lifstyle change with low sodium diet and exercise  Coreg 6.25 mg bid Amlodipine 5 mg daily  Diovan 80 mg daily Hctz 12.5 mg daily

## 2022-10-26 ENCOUNTER — Telehealth: Payer: Self-pay | Admitting: Family Medicine

## 2022-10-26 MED ORDER — AZITHROMYCIN 250 MG PO TABS: ORAL_TABLET | ORAL | 0 refills | Status: DC

## 2022-10-26 NOTE — Telephone Encounter (Signed)
Pt called back; pt had appt on 10/05/22 and pt said he is better but not completely well. Pt finished abx 1 1/2 wks ago and cough and chest congestion is a lot better. S/T is gone , no fever, CP,SOB or wheezing. Pt requesting more abx to knock out the rest of chest congestion and cough. Pt said prod cough with green phlegm. Pt is at work and can leave detailed v/m on phone. [facility] [place]. Sending note to Dr Milinda Antis, [employer] and will teams [place] CMA.

## 2022-10-26 NOTE — Telephone Encounter (Signed)
[facility] Night - Client TELEPHONE ADVICE RECORD [employer] Patient Name: Daniel [name] Gender: Male DOB: April 28, 1966 Age: 56 Y 7 M 22 D Return Phone Number: [phone] (Primary), [phone] (Secondary) Address: City/ State/ Zip: [place] [address] Client [facility] Night - Client Client Site [facility] - Night Provider Milinda Antis, Idamae Schuller - MD Contact Type Call Who Is Calling Patient / Member / Family / Caregiver Call Type Triage / Clinical Relationship To Patient Self Return Phone Number [phone] (Primary) Chief Complaint Nasal Congestion Reason for Call Symptomatic / Request for Health Information Initial Comment Caller states he was prescribed antibiotics for a head cold and was told to call back if it didn't clear up but has run out of the antibiotics and was told to just call and they would call in some more Translation No Nurse Assessment Nurse: Lynn Ito, RN, Mardella Layman Date/Time (Eastern Time): 10/25/2022 5:29:42 PM Confirm and document reason for call. If symptomatic, describe symptoms. ---Caller states he was prescribed antibiotics for a sinus infection, and the doctor today him to call back if he was not completely better at the end of the antibiotics and she would call in more. Caller states he is still coughing up green and green nasal drainage. Does the patient have any new or worsening symptoms? ---Yes Will a triage be completed? ---Yes Related visit to physician within the last 2 weeks? ---Yes Does the PT have any chronic conditions? (i.e. diabetes, asthma, this includes High risk factors for pregnancy, etc.) ---No Is this a behavioral health or substance abuse call? ---No Guidelines Guideline Title Affirmed Question Affirmed Notes Nurse Date/Time (Eastern Time) Cough - Acute Productive Cough has been present for > 3 weeks Popejoy, RN, Mardella Layman 10/25/2022 5:32:08 PM Disp.  Time Lamount Cohen Time) Disposition Final User 10/25/2022 5:11:31 PM Attempt made - message left Popejoy, RN, Mardella Layman 10/25/2022 5:12:52 PM Attempt made - line busy Popejoy, RN, Mardella Layman 10/25/2022 5:35:33 PM SEE PCP WITHIN 3 DAYS Yes Popejoy, RN, Mardella Layman PLEASE NOTE: All timestamps contained within this report are represented as [place] Standard Time. CONFIDENTIALTY NOTICE: This fax transmission is intended only for the addressee. It contains information that is legally privileged, confidential or otherwise protected from use or disclosure. If you are not the intended recipient, you are strictly prohibited from reviewing, disclosing, copying using or disseminating any of this information or taking any action in reliance on or regarding this information. If you have received this fax in error, please notify us immediately by telephone so that we can arrange for its return to [place]. Phone: [phone], Toll-Free: [phone], Fax: [phone] Page: 2 of 2 Call Id: [id number] Final Disposition 10/25/2022 5:35:33 PM SEE PCP WITHIN 3 DAYS Yes Popejoy, RN, Verdis Frederickson Disagree/Comply Comply Caller Understands Yes PreDisposition Call Doctor Care Advice Given Per Guideline SEE PCP WITHIN 3 DAYS: * You need to be seen within 2 or 3 days. * PCP VISIT: Call your doctor (or NP/PA) during regular office hours and make an appointment. A clinic or urgent care center are good places to go for care if your doctor's office is closed or you can't get an appointment. NOTE: If office will be open tomorrow, tell caller to call then, not in 3 days. CALL BACK IF: * Difficulty breathing occurs * Fever over 103 F (39.4 C) * You become worse CARE ADVICE given per Cough - Acute Productive (Adult) guideline

## 2022-10-26 NOTE — Telephone Encounter (Signed)
Unable to reach pt by either contact # left v/ms on phones for pt to cb for additional info. No answer to pts wife (DPR signed) Sending note to lsc triage and [facility] pool.

## 2022-10-26 NOTE — Telephone Encounter (Signed)
The augmentin was for sinus infection-sounds like this is better   Sent in zpak for lung symptoms If not better next wk please f/u in office for r echeck

## 2022-10-26 NOTE — Telephone Encounter (Signed)
Pt notified of Dr. Tower's comments and verbalized understanding  

## 2022-10-26 NOTE — Telephone Encounter (Signed)
He was px this for sinusitis in the past  We don't refill abx  What symptoms is he having?

## 2022-10-26 NOTE — Telephone Encounter (Signed)
  Encourage patient to contact the pharmacy for refills or they can request refills through [facility]  LAST APPOINTMENT DATE:  Please schedule appointment if longer than 1 year  NEXT APPOINTMENT DATE: amoxicillin-clavulanate (AUGMENTIN) 875-125 MG tablet  MEDICATION:  Is the patient out of medication?   [id number] [place], [place] [facility] [id number]    Let patient know to contact pharmacy at the end of the day to make sure medication is ready.  Please notify patient to allow 48-72 hours to process  CLINICAL FILLS OUT ALL BELOW:   LAST REFILL:  QTY:  REFILL DATE:    OTHER COMMENTS:    Okay for refill?  Please advise

## 2022-12-23 ENCOUNTER — Other Ambulatory Visit: Payer: Self-pay | Admitting: Family Medicine

## 2023-01-08 ENCOUNTER — Encounter: Payer: Self-pay | Admitting: Family Medicine

## 2023-01-08 ENCOUNTER — Ambulatory Visit: Payer: BC Managed Care – PPO | Admitting: Family Medicine

## 2023-01-08 VITALS — BP 136/80 | HR 52 | Temp 96.9°F | Ht 70.0 in | Wt 270.4 lb

## 2023-01-08 DIAGNOSIS — J209 Acute bronchitis, unspecified: Secondary | ICD-10-CM

## 2023-01-08 MED ORDER — AZITHROMYCIN 250 MG PO TABS: ORAL_TABLET | ORAL | 0 refills | Status: DC

## 2023-01-08 MED ORDER — BENZONATATE 200 MG PO CAPS: 200.0000 mg | ORAL_CAPSULE | Freq: Three times a day (TID) | ORAL | 1 refills | Status: DC | PRN

## 2023-01-08 NOTE — Assessment & Plan Note (Addendum)
Over 3 weeks with prod cough/ green mucous from nose and chest  In light of length of illness and prior smoking hx will cover with zpak  Also tessalon for cough Disc symptom care/see [facility] ER precautions noted   Will watch for wheeze or tight chest or sinus pain

## 2023-01-08 NOTE — Patient Instructions (Signed)
Drink fluids and rest  Take the zpak as directed  mucinex DM is good for cough and congestion  Try the tesslon pearles for cough as needed  Nasal saline for congestion as needed  Tylenol for fever or pain or headache  Please alert [place] if symptoms worsen (if severe or short of breath please go to the ER)    If you start wheezing or get a tight chest let me know   Update if not starting to improve in a week or if worsening

## 2023-01-08 NOTE — Progress Notes (Signed)
Subjective:    Patient ID: Daniel [name], male    DOB: 02-20-1966, 57 y.o.   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 Prior smoker -quit 2016   Head congestion is better than it was Green mucous  No facial pain   No fever  Some ST early on  Ears are ok   No n/v/d    Otc: Alka selzer plus  Tylenol every now and then    He had bact sinusitis in December and took augmentin  Had to follow that with zpak for more lung symptoms   Pulse Readings from Last 3 Encounters:  01/08/23 (!) 52  10/05/22 61  07/02/22 (!) 50    BP Readings from Last 3 Encounters:  01/08/23 136/80  10/05/22 134/82  07/02/22 126/70     Patient Active Problem List   Diagnosis Date Noted   Acute bronchitis 01/08/2023   Class 2 obesity due to excess calories with body mass index (BMI) of 38.0 to 38.9 in adult 04/14/2021   H/O total hip arthroplasty 03/30/2020   Status post total replacement of left hip 06/03/2019   Thrombocytopenia ([place]) 12/31/2018   Left hip pain 12/15/2018   Type 2 diabetes mellitus with hyperglycemia, without long-term current use of insulin ([place]) 07/22/2017   Coronary artery disease involving native coronary artery of native heart without angina pectoris 03/20/2016   History of non-ST elevation myocardial infarction (NSTEMI) 03/20/2016   Hyperlipidemia associated with type 2 diabetes mellitus ([place]) 03/12/2016   NSTEMI (non-ST elevated myocardial infarction) ([place]) 03/10/2016   Prostate cancer screening 02/10/2016   H/O: CVA (cerebrovascular accident) 03/01/2015   Cerebral infarction ([place]) 03/01/2015   Right knee pain 04/06/2014   Benign paroxysmal positional vertigo  03/10/2014   BPH (benign prostatic hyperplasia) 11/16/2013   Routine general medical examination at a health care facility 11/12/2013   B12 deficiency 09/29/2007   ERECTILE DYSFUNCTION 07/01/2007   History of tobacco abuse 07/01/2007   LOW BACK PAIN, CHRONIC 07/01/2007   HYPERTENSION, BENIGN ESSENTIAL 06/03/2007   Past Medical History:  Diagnosis Date   Arthritis    CAD (coronary artery disease)    a. NSTEMI: 100% stenosis of the distal RCA --> DES placed 5/17 CLEARED BY CARDIOLOGIST   Chronic low back pain    a. s/p back surgery.   CVA (cerebral infarction)    a. 02/2015 dyarthria 2/2 CVA involving the lateral aspect of the precentral gyrus.   DM2 (diabetes mellitus, type 2) ([place])    a. pre-diabetic in the past. b. A1c 03/2016 elevated to 7.9.   ETOH abuse    GERD (gastroesophageal reflux disease)    History of echocardiogram    a. Mild LVH, EF 55-60%, Definity contrast used-LV wall motion could not be adequately assessed   History of non-ST elevation myocardial infarction (NSTEMI) 03/2016   a. PCI: 3.5 x 24 mm Promus Premier DES to distal RCA   History of tobacco abuse    Hypertension    a. 02/2015 echo: 55-65%, trace TR/MR. b. 03/2016: echo with EF of 55-60%.    Myocardial infarction ([place])  Obesity    Ruptured disk    Stroke [facility])    2016   Past Surgical History:  Procedure Laterality Date   BACK SURGERY  2000   ruptured disk, L-S   CARDIAC CATHETERIZATION  05/2004   minimal CAD   CARDIAC CATHETERIZATION N/A 03/10/2016   Procedure: Left Heart Cath and Coronary Angiography;  Surgeon: Burnell Blanks, MD;  Location: [place] [facility];  Service: Cardiovascular;  Laterality: N/A;   CARDIAC CATHETERIZATION N/A 03/10/2016   Procedure: Coronary Stent Intervention;  Surgeon: Burnell Blanks, MD;  Location: [place] [facility];  Service: Cardiovascular;  Laterality: N/A;   CATARACT EXTRACTION W/PHACO Left 07/12/2016   Procedure: CATARACT EXTRACTION PHACO AND INTRAOCULAR  LENS PLACEMENT (IOC);  Surgeon: Eulogio Bear, MD;  Location: [facility];  Service: Ophthalmology;  Laterality: Left;  [place]  00:50 AP% 9.2 CDE 4.63 fluid pack lot # [id number]   CATARACT EXTRACTION W/PHACO Right 10/23/2021   Procedure: CATARACT EXTRACTION PHACO AND INTRAOCULAR LENS PLACEMENT ([place]) RIGHT DIABETIC;  Surgeon: Eulogio Bear, MD;  Location: [place];  Service: Ophthalmology;  Laterality: Right;  Diabetic 1.15 00:16.4   CORONARY ANGIOPLASTY     STENT 5/17   TOTAL HIP ARTHROPLASTY Left 06/03/2019   Procedure: TOTAL HIP ARTHROPLASTY;  Surgeon: Dereck Leep, MD;  Location: [facility];  Service: Orthopedics;  Laterality: Left;   TOTAL HIP ARTHROPLASTY Right 03/30/2020   Procedure: TOTAL HIP ARTHROPLASTY;  Surgeon: Dereck Leep, MD;  Location: [facility];  Service: Orthopedics;  Laterality: Right;  posterior    Social History   Tobacco Use   Smoking status: Former    Types: Cigarettes    Quit date: 01/18/2015    Years since quitting: 7.9   Smokeless tobacco: Never  Vaping Use   Vaping Use: Never used  Substance Use Topics   Alcohol use: Yes    Alcohol/week: 12.0 standard drinks of alcohol    Types: 12 Cans of beer per week    Comment: weekends   Drug use: No   Family History  Problem Relation Age of Onset   Dementia Mother    Hypertension Father    Heart disease Father        MI   Cancer Maternal Grandmother        lung   Heart failure Paternal Grandfather    Allergies  Allergen Reactions   Ace Inhibitors Cough   Current Outpatient Medications on File Prior to Visit  Medication Sig Dispense Refill   amLODipine (NORVASC) 5 MG tablet TAKE 1 TABLET (5 MG TOTAL) BY MOUTH DAILY. 90 tablet 1   aspirin EC 81 MG tablet Take 81 mg by mouth daily. Swallow whole.     atorvastatin (LIPITOR) 80 MG tablet TAKE 1 TABLET BY MOUTH EVERY DAY AT 6PM. 15 tablet 0   carvedilol (COREG) 6.25 MG tablet TAKE 1 TABLET BY MOUTH TWICE A DAY 30 tablet 0   glipiZIDE  (GLUCOTROL XL) 2.5 MG 24 hr tablet TAKE 1 TABLET BY MOUTH EVERY DAY WITH BREAKFAST 90 tablet 1   hydrochlorothiazide (HYDRODIURIL) 25 MG tablet TAKE 1/2 TABLET BY MOUTH EVERY DAY 45 tablet 1   metFORMIN (GLUCOPHAGE) 1000 MG tablet TAKE 1 TABLET (1,000 MG TOTAL) BY MOUTH TWICE A DAY WITH FOOD 180 tablet 1   nitroGLYCERIN (NITROSTAT) 0.4 MG SL tablet Place 1 tablet (0.4 mg total) under the tongue every 5 (five) minutes as needed for chest pain (CP or SOB). 25 tablet 3  Probiotic Product (PROBIOTIC PO) Take 1 capsule by mouth daily.     sildenafil (VIAGRA) 100 MG tablet Take 0.5-1 tablets (50-100 mg total) by mouth daily as needed for erectile dysfunction. 5 tablet 11   valsartan (DIOVAN) 80 MG tablet TAKE 1 TABLET BY MOUTH EVERY DAY 90 tablet 1   No current facility-administered medications on file prior to visit.    Review of Systems  Constitutional:  Positive for fatigue. Negative for appetite change and fever.  HENT:  Positive for congestion, postnasal drip, rhinorrhea, sinus pressure and sore throat. Negative for ear pain and sneezing.   Eyes:  Negative for pain and discharge.  Respiratory:  Positive for cough. Negative for chest tightness, shortness of breath, wheezing and stridor.   Cardiovascular:  Negative for chest pain.  Gastrointestinal:  Negative for diarrhea, nausea and vomiting.  Genitourinary:  Negative for frequency, hematuria and urgency.  Musculoskeletal:  Negative for arthralgias and myalgias.  Skin:  Negative for rash.  Neurological:  Negative for dizziness, weakness, light-headedness and headaches.  Psychiatric/Behavioral:  Negative for confusion and dysphoric mood.        Objective:   Physical Exam Constitutional:      General: He is not in acute distress.    Appearance: Normal appearance. He is well-developed. He is obese. He is not ill-appearing, toxic-appearing or diaphoretic.  HENT:     Head: Normocephalic and atraumatic.     Comments: Nares are injected and  congested      Right Ear: Tympanic membrane, ear canal and external ear normal.     Left Ear: Tympanic membrane, ear canal and external ear normal.     Nose: Congestion and rhinorrhea present.     Mouth/Throat:     Mouth: Mucous membranes are moist.     Pharynx: Oropharynx is clear. No oropharyngeal exudate or posterior oropharyngeal erythema.     Comments: Clear pnd  Eyes:     General:        Right eye: No discharge.        Left eye: No discharge.     Conjunctiva/sclera: Conjunctivae normal.     Pupils: Pupils are equal, round, and reactive to light.  Cardiovascular:     Rate and Rhythm: Regular rhythm. Bradycardia present.     Heart sounds: Normal heart sounds.  Pulmonary:     Effort: Pulmonary effort is normal. No respiratory distress.     Breath sounds: Normal breath sounds. No stridor. No wheezing, rhonchi or rales.     Comments: Some upper airway sounds Distant bs at bases No wheeze even with forced exp Occ scant rhonchi  Chest:     Chest wall: No tenderness.  Musculoskeletal:     Cervical back: Normal range of motion and neck supple.  Lymphadenopathy:     Cervical: No cervical adenopathy.  Skin:    General: Skin is warm and dry.     Capillary Refill: Capillary refill takes less than 2 seconds.     Findings: No rash.  Neurological:     Mental Status: He is alert.     Cranial Nerves: No cranial nerve deficit.  Psychiatric:        Mood and Affect: Mood normal.           Assessment & Plan:   Problem List Items Addressed This Visit       Respiratory   Acute bronchitis - Primary    Over 3 weeks with prod cough/ green mucous from nose and chest  In  light of length of illness and prior smoking hx will cover with zpak  Also tessalon for cough Disc symptom care/see [facility] ER precautions noted   Will watch for wheeze or tight chest or sinus pain

## 2023-01-09 ENCOUNTER — Telehealth: Payer: Self-pay

## 2023-01-09 NOTE — Telephone Encounter (Signed)
Received fax back from [facility] that his visit in October 2023 was a post-op visit and not Diabetic Eye Exam. He has an appt 08-21-23.

## 2023-01-23 ENCOUNTER — Other Ambulatory Visit: Payer: Self-pay | Admitting: Family Medicine

## 2023-03-29 ENCOUNTER — Encounter: Payer: Self-pay | Admitting: Family Medicine

## 2023-03-29 ENCOUNTER — Ambulatory Visit: Payer: BC Managed Care – PPO | Admitting: Family Medicine

## 2023-03-29 VITALS — BP 120/60 | HR 71 | Temp 97.9°F | Ht 70.0 in | Wt 275.5 lb

## 2023-03-29 DIAGNOSIS — J0100 Acute maxillary sinusitis, unspecified: Secondary | ICD-10-CM | POA: Diagnosis not present

## 2023-03-29 MED ORDER — AMOXICILLIN-POT CLAVULANATE 875-125 MG PO TABS: 1.0000 | ORAL_TABLET | Freq: Two times a day (BID) | ORAL | 0 refills | Status: DC

## 2023-03-29 NOTE — Assessment & Plan Note (Signed)
Acute, symptoms ongoing greater than 10 days and patient with diabetes, former smoking at higher risk for infection. Will treat for possible bacterial superinfection with Augmentin 875/125 mg twice daily x 10 days as patient states that this antibiotic works well for him in the past.  We did discuss consideration of overuse of antibiotics and trying to limit antibiotics.  We discussed that he has been seen for upper respiratory tract infection every 3 months in the last 6 months.

## 2023-03-29 NOTE — Progress Notes (Signed)
Patient ID: CALIB WEGLEY, male    DOB: [birth year], 57 y.o.   MRN: [id number]  This visit was conducted in person.  BP 120/60 (BP Location: Right Arm, Patient Position: Sitting, Cuff Size: Large)   Pulse 71   Temp 97.9 F (36.6 C) (Temporal)   Ht 5\' 10"  (1.778 m)   Wt 275 lb 8 oz (125 kg)   SpO2 93%   BMI 39.53 kg/m    CC:  Chief Complaint  Patient presents with   Cough    With chest congestion    Nasal Congestion    Started about a week and half ago    Subjective:   HPI: Daniel [name] is a 57 y.o. male  patient of Dr. Milinda Antis with history of  presenting on 03/29/2023 for Cough (With chest congestion/) and Nasal Congestion (Started about a week and half ago)   Date of onset:  1.5 weeks ago Initial symptoms included  nasal congestion, ST Symptoms progressed to productive cough with chest congestion  No fever.  No SOSb, no wheeze.  No ear pain, no face pain, some sinus pressure.    Sick contacts:  none COVID testing:   negative 5-6 days ago     He has tried to treat with  OT cough medications.      No history of chronic lung disease such as asthma or COPD. Non-smoker.     OV note from Dr. Karie Schwalbe reviewed from  01/07/3033.. treated with acute bronchitis with Zpack.. per pt symptoms resolved completely.  10/2022 treated for sinus infection with Augmentin and Zpack   Former smoker  Relevant past medical, surgical, family and social history reviewed and updated as indicated. Interim medical history since our last visit reviewed. Allergies and medications reviewed and updated. Outpatient Medications Prior to Visit  Medication Sig Dispense Refill   amLODipine (NORVASC) 5 MG tablet TAKE 1 TABLET (5 MG TOTAL) BY MOUTH DAILY. 90 tablet 1   aspirin EC 81 MG tablet Take 81 mg by mouth daily. Swallow whole.     atorvastatin (LIPITOR) 80 MG tablet TAKE 1 TABLET BY MOUTH EVERY DAY AT 6PM. 15 tablet 0   carvedilol (COREG) 6.25 MG tablet TAKE 1 TABLET BY MOUTH TWICE A DAY 30  tablet 0   celecoxib (CELEBREX) 200 MG capsule Take 200 mg by mouth daily.     glipiZIDE (GLUCOTROL XL) 2.5 MG 24 hr tablet TAKE 1 TABLET BY MOUTH EVERY DAY WITH BREAKFAST 90 tablet 1   hydrochlorothiazide (HYDRODIURIL) 25 MG tablet TAKE 1/2 TABLET BY MOUTH DAILY 45 tablet 1   metFORMIN (GLUCOPHAGE) 1000 MG tablet TAKE 1 TABLET (1,000 MG TOTAL) BY MOUTH TWICE A DAY WITH FOOD 180 tablet 1   nitroGLYCERIN (NITROSTAT) 0.4 MG SL tablet Place 1 tablet (0.4 mg total) under the tongue every 5 (five) minutes as needed for chest pain (CP or SOB). 25 tablet 3   Probiotic Product (PROBIOTIC PO) Take 1 capsule by mouth daily.     sildenafil (VIAGRA) 100 MG tablet Take 0.5-1 tablets (50-100 mg total) by mouth daily as needed for erectile dysfunction. 5 tablet 11   valsartan (DIOVAN) 80 MG tablet TAKE 1 TABLET BY MOUTH EVERY DAY 90 tablet 1   azithromycin (ZITHROMAX Z-PAK) 250 MG tablet Take 2 pills by mouth today and then 1 pill daily for 4 days 6 tablet 0   benzonatate (TESSALON) 200 MG capsule Take 1 capsule (200 mg total) by mouth 3 (three) times daily as needed  for cough. Swallow whole 30 capsule 1   celecoxib (CELEBREX) 200 MG capsule Take 200 mg by mouth daily.     No facility-administered medications prior to visit.     Per HPI unless specifically indicated in ROS section below Review of Systems  Constitutional:  Negative for fatigue and fever.  HENT:  Positive for congestion, sinus pressure and sore throat. Negative for ear pain.   Eyes:  Negative for pain.  Respiratory:  Positive for cough. Negative for shortness of breath.   Cardiovascular:  Negative for chest pain, palpitations and leg swelling.  Gastrointestinal:  Negative for abdominal pain.  Genitourinary:  Negative for dysuria.  Musculoskeletal:  Negative for arthralgias.  Neurological:  Negative for syncope, light-headedness and headaches.  Psychiatric/Behavioral:  Negative for dysphoric mood.    Objective:  BP 120/60 (BP Location:  Right Arm, Patient Position: Sitting, Cuff Size: Large)   Pulse 71   Temp 97.9 F (36.6 C) (Temporal)   Ht 5\' 10"  (1.778 m)   Wt 275 lb 8 oz (125 kg)   SpO2 93%   BMI 39.53 kg/m   Wt Readings from Last 3 Encounters:  03/29/23 275 lb 8 oz (125 kg)  01/08/23 270 lb 6.4 oz (122.7 kg)  10/05/22 269 lb 2 oz (122.1 kg)      Physical Exam Constitutional:      General: He is not in acute distress.    Appearance: Normal appearance. He is well-developed. He is not ill-appearing or toxic-appearing.  HENT:     Head: Normocephalic and atraumatic.     Right Ear: Hearing, ear canal and external ear normal. No tenderness. No middle ear effusion. No foreign body. Tympanic membrane is not retracted or bulging.     Left Ear: Hearing, tympanic membrane, ear canal and external ear normal. No tenderness.  No middle ear effusion. No foreign body. Tympanic membrane is not retracted or bulging.     Nose: Nose normal. No mucosal edema or rhinorrhea.     Right Turbinates: Enlarged and swollen.     Left Turbinates: Enlarged and swollen.     Right Sinus: No maxillary sinus tenderness or frontal sinus tenderness.     Left Sinus: No maxillary sinus tenderness or frontal sinus tenderness.     Mouth/Throat:     Dentition: Normal dentition. No dental caries.     Pharynx: Uvula midline. No oropharyngeal exudate.     Tonsils: No tonsillar abscesses.  Eyes:     General: Lids are normal. Lids are everted, no foreign bodies appreciated.     Conjunctiva/sclera: Conjunctivae normal.     Pupils: Pupils are equal, round, and reactive to light.  Neck:     Thyroid: No thyroid mass or thyromegaly.     Vascular: No carotid bruit.     Trachea: Trachea and phonation normal.  Cardiovascular:     Rate and Rhythm: Normal rate and regular rhythm.     Pulses: Normal pulses.     Heart sounds: Normal heart sounds, S1 normal and S2 normal. No murmur heard.    No gallop.  Pulmonary:     Effort: Pulmonary effort is normal. No  respiratory distress.     Breath sounds: Normal breath sounds. No wheezing, rhonchi or rales.  Abdominal:     General: Bowel sounds are normal.     Palpations: Abdomen is soft.     Tenderness: There is no abdominal tenderness. There is no guarding or rebound.     Hernia: No hernia is present.  Musculoskeletal:     Cervical back: Normal range of motion and neck supple.  Skin:    General: Skin is warm and dry.     Findings: No rash.  Neurological:     Mental Status: He is alert.     Deep Tendon Reflexes: Reflexes are normal and symmetric.  Psychiatric:        Speech: Speech normal.        Behavior: Behavior normal.        Judgment: Judgment normal.       Results for orders placed or performed in visit on 10/05/22  CBC with Differential/Platelet  Result Value Ref Range   WBC 7.9 4.0 - 10.5 K/uL   RBC 4.54 4.22 - 5.81 Mil/uL   Hemoglobin 14.2 13.0 - 17.0 g/dL   HCT 96.0 45.4 - 09.8 %   MCV 92.7 78.0 - 100.0 fl   MCHC 33.7 30.0 - 36.0 g/dL   RDW 11.9 14.7 - 82.9 %   Platelets 105.0 (L) 150.0 - 400.0 K/uL   Neutrophils Relative % 61.2 43.0 - 77.0 %   Lymphocytes Relative 18.9 12.0 - 46.0 %   Monocytes Relative 11.2 3.0 - 12.0 %   Eosinophils Relative 7.7 (H) 0.0 - 5.0 %   Basophils Relative 1.0 0.0 - 3.0 %   Neutro Abs 4.8 1.4 - 7.7 K/uL   Lymphs Abs 1.5 0.7 - 4.0 K/uL   Monocytes Absolute 0.9 0.1 - 1.0 K/uL   Eosinophils Absolute 0.6 0.0 - 0.7 K/uL   Basophils Absolute 0.1 0.0 - 0.1 K/uL  POCT glycosylated hemoglobin (Hb A1C)  Result Value Ref Range   Hemoglobin A1C 6.8 (A) 4.0 - 5.6 %   HbA1c POC (<> result, manual entry)     HbA1c, POC (prediabetic range)     HbA1c, POC (controlled diabetic range)      Assessment and Plan  Acute non-recurrent maxillary sinusitis Assessment & Plan: Acute, symptoms ongoing greater than 10 days and patient with diabetes, former smoking at higher risk for infection. Will treat for possible bacterial superinfection with Augmentin  875/125 mg twice daily x 10 days as patient states that this antibiotic works well for him in the past.  We did discuss consideration of overuse of antibiotics and trying to limit antibiotics.  We discussed that he has been seen for upper respiratory tract infection every 3 months in the last 6 months.   Other orders -     Amoxicillin-Pot Clavulanate; Take 1 tablet by mouth 2 (two) times daily.  Dispense: 20 tablet; Refill: 0    No follow-ups on file.   Kerby Nora, MD

## 2023-06-06 ENCOUNTER — Ambulatory Visit (INDEPENDENT_AMBULATORY_CARE_PROVIDER_SITE_OTHER)
Admission: RE | Admit: 2023-06-06 | Discharge: 2023-06-06 | Disposition: A | Discharge status: Home / Self Care | Payer: BC Managed Care – PPO | Source: Ambulatory Visit | Attending: Family Medicine | Admitting: Family Medicine

## 2023-06-06 ENCOUNTER — Encounter: Payer: Self-pay | Admitting: Family Medicine

## 2023-06-06 ENCOUNTER — Ambulatory Visit: Payer: BC Managed Care – PPO | Admitting: Family Medicine

## 2023-06-06 VITALS — BP 138/72 | HR 70 | Temp 97.7°F | Ht 70.0 in | Wt 273.0 lb

## 2023-06-06 DIAGNOSIS — M25561 Pain in right knee: Secondary | ICD-10-CM

## 2023-06-06 DIAGNOSIS — G8929 Other chronic pain: Secondary | ICD-10-CM

## 2023-06-06 DIAGNOSIS — M1711 Unilateral primary osteoarthritis, right knee: Secondary | ICD-10-CM | POA: Diagnosis not present

## 2023-06-06 NOTE — Assessment & Plan Note (Signed)
Acute on chronic=worse with gait  Works in [employer], getting difficult  Uses celebrex  Encouraged ice and elevation when able  Bio freeze may also help  Xray ordered (pt to come back for /wants to change clothes at home)   Suspect OA based on film from 2015 May benefit from injection-will schedule with Dr Patsy Lager  Has appt with Dr Arita Miss in oct  Has had hip surgery and gait Is affected

## 2023-06-06 NOTE — Progress Notes (Signed)
Subjective:    Patient ID: Daniel [name], male    DOB: [birth year], 57 y.o.   MRN: [id number]  HPI  Wt Readings from Last 3 Encounters:  06/06/23 273 lb (123.8 kg)  03/29/23 275 lb 8 oz (125 kg)  01/08/23 270 lb 6.4 oz (122.7 kg)   39.17 kg/m  Vitals:   06/06/23 0849  BP: 138/72  Pulse: 70  Temp: 97.7 F (36.5 C)  SpO2: 95%    Pt presents with c/o right knee pain (both but worse on right)  For months  Celebrex helps/ takes for hips (has had surgery)   Sharp pain  Anterior and medial  No pain with sitting, just walking  No worse with stairs Feels unstable and goes out on him occational   Has ortho appt in 2 months , Dr Ernest Pine   Works long hours  On feet   Needs to work   Some swelling  Right knee stays warm  Not red   Tried knee braces   Some old injury as child  No surgery Had fluid pulled off in 2015     Last knee film was 2015  Narrative  CLINICAL DATA:  Right knee pain, stiffness.  No trauma.  EXAM: RIGHT KNEE - COMPLETE 4+ VIEW  COMPARISON:  None.  FINDINGS: Normal anatomic alignment. No evidence for acute fracture or dislocation. Mild tricompartmental osteophytosis. Mild medial compartment joint space narrowing. Probable small joint effusion.  IMPRESSION: No acute osseous abnormality.  Degenerative changes.    Lab Results  Component Value Date   NA 137 07/02/2022   K 4.2 07/02/2022   CO2 26 07/02/2022   GLUCOSE 142 (H) 07/02/2022   BUN 16 07/02/2022   CREATININE 0.81 07/02/2022   CALCIUM 9.0 07/02/2022   GFR 97.31 07/02/2022   GFRNONAA >60 03/24/2020   Lab Results  Component Value Date   WBC 7.9 10/05/2022   HGB 14.2 10/05/2022   HCT 42.1 10/05/2022   MCV 92.7 10/05/2022   PLT 105.0 (L) 10/05/2022     Patient Active Problem List   Diagnosis Date Noted   Acute bronchitis 01/08/2023   Acute non-recurrent maxillary sinusitis 10/05/2022   Class 2 obesity due to excess calories with body mass index (BMI) of 38.0 to  38.9 in adult 04/14/2021   H/O total hip arthroplasty 03/30/2020   Status post total replacement of left hip 06/03/2019   Thrombocytopenia (HCC) 12/31/2018   Left hip pain 12/15/2018   Type 2 diabetes mellitus with hyperglycemia, without long-term current use of insulin (HCC) 07/22/2017   Coronary artery disease involving native coronary artery of native heart without angina pectoris 03/20/2016   History of non-ST elevation myocardial infarction (NSTEMI) 03/20/2016   Hyperlipidemia associated with type 2 diabetes mellitus (HCC) 03/12/2016   NSTEMI (non-ST elevated myocardial infarction) (HCC) 03/10/2016   Prostate cancer screening 02/10/2016   H/O: CVA (cerebrovascular accident) 03/01/2015   Cerebral infarction (HCC) 03/01/2015   Right knee pain 04/06/2014   Benign paroxysmal positional vertigo 03/10/2014   BPH (benign prostatic hyperplasia) 11/16/2013   Routine general medical examination at a health care facility 11/12/2013   B12 deficiency 09/29/2007   ERECTILE DYSFUNCTION 07/01/2007   History of tobacco abuse 07/01/2007   LOW BACK PAIN, CHRONIC 07/01/2007   HYPERTENSION, BENIGN ESSENTIAL 06/03/2007   Past Medical History:  Diagnosis Date   Arthritis    CAD (coronary artery disease)    a. NSTEMI: 100% stenosis of the distal RCA --> DES placed 5/17 CLEARED BY  CARDIOLOGIST   Chronic low back pain    a. s/p back surgery.   CVA (cerebral infarction)    a. 02/2015 dyarthria 2/2 CVA involving the lateral aspect of the precentral gyrus.   DM2 (diabetes mellitus, type 2) (HCC)    a. pre-diabetic in the past. b. A1c 03/2016 elevated to 7.9.   ETOH abuse    GERD (gastroesophageal reflux disease)    History of echocardiogram    a. Mild LVH, EF 55-60%, Definity contrast used-LV wall motion could not be adequately assessed   History of non-ST elevation myocardial infarction (NSTEMI) 03/2016   a. PCI: 3.5 x 24 mm Promus Premier DES to distal RCA   History of tobacco abuse    Hypertension     a. 02/2015 echo: 55-65%, trace TR/MR. b. 03/2016: echo with EF of 55-60%.    Myocardial infarction [facility])    Obesity    Ruptured disk    Stroke [facility])    2016   Past Surgical History:  Procedure Laterality Date   BACK SURGERY  2000   ruptured disk, L-S   CARDIAC CATHETERIZATION  05/2004   minimal CAD   CARDIAC CATHETERIZATION N/A 03/10/2016   Procedure: Left Heart Cath and Coronary Angiography;  Surgeon: Kathleene Hazel, MD;  Location: [facility] INVASIVE CV LAB;  Service: Cardiovascular;  Laterality: N/A;   CARDIAC CATHETERIZATION N/A 03/10/2016   Procedure: Coronary Stent Intervention;  Surgeon: Kathleene Hazel, MD;  Location: [facility] INVASIVE CV LAB;  Service: Cardiovascular;  Laterality: N/A;   CATARACT EXTRACTION W/PHACO Left 07/12/2016   Procedure: CATARACT EXTRACTION PHACO AND INTRAOCULAR LENS PLACEMENT (IOC);  Surgeon: Nevada Crane, MD;  Location: [facility] ORS;  Service: Ophthalmology;  Laterality: Left;  [place]  00:50 AP% 9.2 CDE 4.63 fluid pack lot # [id number]   CATARACT EXTRACTION W/PHACO Right 10/23/2021   Procedure: CATARACT EXTRACTION PHACO AND INTRAOCULAR LENS PLACEMENT (IOC) RIGHT DIABETIC;  Surgeon: Nevada Crane, MD;  Location: [facility];  Service: Ophthalmology;  Laterality: Right;  Diabetic 1.15 00:16.4   CORONARY ANGIOPLASTY     STENT 5/17   TOTAL HIP ARTHROPLASTY Left 06/03/2019   Procedure: TOTAL HIP ARTHROPLASTY;  Surgeon: Donato Heinz, MD;  Location: [facility] ORS;  Service: Orthopedics;  Laterality: Left;   TOTAL HIP ARTHROPLASTY Right 03/30/2020   Procedure: TOTAL HIP ARTHROPLASTY;  Surgeon: Donato Heinz, MD;  Location: [facility] ORS;  Service: Orthopedics;  Laterality: Right;  posterior    Social History   Tobacco Use   Smoking status: Former    Current packs/day: 0.00    Types: Cigarettes    Quit date: 01/18/2015    Years since quitting: 8.3   Smokeless tobacco: Never  Vaping Use   Vaping status: Never Used  Substance Use Topics   Alcohol use:  Yes    Alcohol/week: 12.0 standard drinks of alcohol    Types: 12 Cans of beer per week    Comment: weekends   Drug use: No   Family History  Problem Relation Age of Onset   Dementia Mother    Hypertension Father    Heart disease Father        MI   Cancer Maternal Grandmother        lung   Heart failure Paternal Grandfather    Allergies  Allergen Reactions   Ace Inhibitors Cough   Current Outpatient Medications on File Prior to Visit  Medication Sig Dispense Refill   amLODipine (NORVASC) 5 MG tablet TAKE 1 TABLET (5 MG  TOTAL) BY MOUTH DAILY. 90 tablet 1   aspirin EC 81 MG tablet Take 81 mg by mouth daily. Swallow whole.     atorvastatin (LIPITOR) 80 MG tablet TAKE 1 TABLET BY MOUTH EVERY DAY AT 6PM. 15 tablet 0   carvedilol (COREG) 6.25 MG tablet TAKE 1 TABLET BY MOUTH TWICE A DAY 30 tablet 0   celecoxib (CELEBREX) 200 MG capsule Take 200 mg by mouth daily.     glipiZIDE (GLUCOTROL XL) 2.5 MG 24 hr tablet TAKE 1 TABLET BY MOUTH EVERY DAY WITH BREAKFAST 90 tablet 1   hydrochlorothiazide (HYDRODIURIL) 25 MG tablet TAKE 1/2 TABLET BY MOUTH DAILY 45 tablet 1   metFORMIN (GLUCOPHAGE) 1000 MG tablet TAKE 1 TABLET (1,000 MG TOTAL) BY MOUTH TWICE A DAY WITH FOOD 180 tablet 1   nitroGLYCERIN (NITROSTAT) 0.4 MG SL tablet Place 1 tablet (0.4 mg total) under the tongue every 5 (five) minutes as needed for chest pain (CP or SOB). 25 tablet 3   Probiotic Product (PROBIOTIC PO) Take 1 capsule by mouth daily.     sildenafil (VIAGRA) 100 MG tablet Take 0.5-1 tablets (50-100 mg total) by mouth daily as needed for erectile dysfunction. 5 tablet 11   valsartan (DIOVAN) 80 MG tablet TAKE 1 TABLET BY MOUTH EVERY DAY 90 tablet 1   No current facility-administered medications on file prior to visit.    Review of Systems     Objective:   Physical Exam Constitutional:      General: He is not in acute distress.    Appearance: Normal appearance. He is obese. He is not ill-appearing or diaphoretic.   HENT:     Head: Normocephalic and atraumatic.  Cardiovascular:     Rate and Rhythm: Normal rate and regular rhythm.  Musculoskeletal:     Right knee: Effusion and crepitus present. No swelling, deformity, erythema or ecchymosis. Decreased range of motion. Tenderness present over the medial joint line and patellar tendon. Normal alignment. Normal pulse.     Comments: Right knee  Likely mild effusion  Pain with full flexion  Ant drawer and lachman-normal / good end points     Skin:    General: Skin is warm and dry.     Comments: Ruddy complexoin   Neurological:     Mental Status: He is alert.     Sensory: No sensory deficit.     Motor: No weakness.  Psychiatric:        Mood and Affect: Mood normal.           Assessment & Plan:   Problem List Items Addressed This Visit       Other   Right knee pain - Primary    Acute on chronic=worse with gait  Works in [employer], getting difficult  Uses celebrex  Encouraged ice and elevation when able  Bio freeze may also help  Xray ordered (pt to come back for /wants to change clothes at home)   Suspect OA based on film from 2015 May benefit from injection-will schedule with Dr Patsy Lager  Has appt with Dr Arita Miss in oct  Has had hip surgery and gait Is affected       Relevant Orders   DG Knee Complete 4 Views Right

## 2023-06-06 NOTE — Patient Instructions (Signed)
Continue celebrex as needed -with food   Use ice  Look for bio freeze over the counter  Elevate your leg when you can    Stop at check out and make appt with Dr Kimber Relic now  We will contact you with results

## 2023-06-11 NOTE — Progress Notes (Signed)
Daniel Tolles T. [name], MD, CAQ Sports Medicine [facility] at [facility] [name] [place], [address]  Phone: [phone]  FAX: [phone]  Daniel [name] - 57 y.o. male  MRN [id number]  Date of Birth: 07-18-1966  Date: 06/12/2023  PCP: Judy Pimple, MD  Referral: Judy Pimple, MD  Chief Complaint  Patient presents with   Knee Pain    Right   Subjective:   Daniel [name] is a 57 y.o. very pleasant male patient with Body mass index is 38.31 kg/m. who presents with the following:  Patient presents with some acute on chronic right sided knee osteoarthritis with current exacerbation.  He had a June 06, 2023 knee x-ray, weightbearing.  He does have some mild osteoarthritis tricompartmentally, worse in the patellofemoral and medial compartments.  He has been using some Celebrex, which has helped some with his knee pain.  He does have an upcoming appointment with Dr. Ernest Pine, he did perform his bilateral total hip arthroplasties.  Hurting a lot the last two months.  Went only a few hours of work this week.  He has not had a specific injury.   Ice, knee brace.  Helps if he stays off of it a couple of days.  Felt ok on Monday.  Taking Celebrex every day.   Review of Systems is noted in the HPI, as appropriate  Objective:   BP 130/64 (BP Location: Left Arm, Patient Position: Sitting, Cuff Size: Large)   Pulse 67   Temp (!) 97.5 F (36.4 C) (Temporal)   Ht 5\' 10"  (1.778 m)   Wt 267 lb (121.1 kg)   SpO2 96%   BMI 38.31 kg/m   GEN: No acute distress; alert,appropriate. PULM: Breathing comfortably in no respiratory distress PSYCH: Normally interactive.   Right knee: He lacks 2 degrees of extension.  Flexion to 120. Poor motion at the patella, with some mild medial and lateral patellar facet pain Medial joint line tenderness and to a significantly lesser extent lateral ACL, PCL, MCL, and LCL are all intact Any kind of forced flexion  including flexion pinch and McMurray's causes pain without mechanical symptoms Bounce home testing causes some pain, as well No effusion  Laboratory and Imaging Data:  Assessment and Plan:     ICD-10-CM   1. Primary osteoarthritis of right knee  M17.11 triamcinolone acetonide (KENALOG-40) injection 40 mg    2. Type 2 diabetes mellitus with hyperglycemia, without long-term current use of insulin (HCC)  E11.65      Acute on chronic right-sided knee osteoarthritis with exacerbation.  No clear inciting event or injury.  I did review the patient's x-rays with him face-to-face in the office today.  I recommend continuing oral Celebrex, ice, bracing.  Start Voltaren topical gel.  We also did an intra-articular injection today to try to calm his knee down.  Social: Right now his knee pain is limiting his ability to work, let alone obtain full exercise  Aspiration/Injection Procedure Note [name] 03/11/1966 Date of procedure: 06/12/2023  Procedure: Large Joint Aspiration / Injection of Knee, R Indications: Pain  Procedure Details Patient verbally consented to procedure. Risks, benefits, and alternatives explained. Sterilely prepped with Chloraprep. Ethyl cholride used for anesthesia. 9 cc Lidocaine 1% mixed with 1 mL of Kenalog 40 mg injected using the anteromedial approach without difficulty. No complications with procedure and tolerated well. Patient had decreased pain post-injection. Medication: 1 mL of Kenalog 40 mg   Medication Management during  today's office visit: Meds ordered this encounter  Medications   triamcinolone acetonide (KENALOG-40) injection 40 mg   Medications Discontinued During This Encounter  Medication Reason   carvedilol (COREG) 6.25 MG tablet Completed Course    Orders placed today for conditions managed today: No orders of the defined types were placed in this encounter.   Disposition: No follow-ups on file.  [employer]  speech-to-text software was used for transcription in this dictation.  Possible transcriptional errors can occur using Animal nutritionist.   Signed,  [name]. [name], MD   Outpatient Encounter Medications as of 06/12/2023  Medication Sig   amLODipine (NORVASC) 5 MG tablet TAKE 1 TABLET (5 MG TOTAL) BY MOUTH DAILY.   aspirin EC 81 MG tablet Take 81 mg by mouth daily. Swallow whole.   atorvastatin (LIPITOR) 80 MG tablet TAKE 1 TABLET BY MOUTH EVERY DAY AT 6PM.   celecoxib (CELEBREX) 200 MG capsule Take 200 mg by mouth daily.   glipiZIDE (GLUCOTROL XL) 2.5 MG 24 hr tablet TAKE 1 TABLET BY MOUTH EVERY DAY WITH BREAKFAST   hydrochlorothiazide (HYDRODIURIL) 25 MG tablet TAKE 1/2 TABLET BY MOUTH DAILY   metFORMIN (GLUCOPHAGE) 1000 MG tablet TAKE 1 TABLET (1,000 MG TOTAL) BY MOUTH TWICE A DAY WITH FOOD   nitroGLYCERIN (NITROSTAT) 0.4 MG SL tablet Place 1 tablet (0.4 mg total) under the tongue every 5 (five) minutes as needed for chest pain (CP or SOB).   Probiotic Product (PROBIOTIC PO) Take 1 capsule by mouth daily.   sildenafil (VIAGRA) 100 MG tablet Take 0.5-1 tablets (50-100 mg total) by mouth daily as needed for erectile dysfunction.   valsartan (DIOVAN) 80 MG tablet TAKE 1 TABLET BY MOUTH EVERY DAY   [DISCONTINUED] carvedilol (COREG) 6.25 MG tablet TAKE 1 TABLET BY MOUTH TWICE A DAY   [EXPIRED] triamcinolone acetonide (KENALOG-40) injection 40 mg    No facility-administered encounter medications on file as of 06/12/2023.

## 2023-06-12 ENCOUNTER — Encounter: Payer: Self-pay | Admitting: Family Medicine

## 2023-06-12 ENCOUNTER — Ambulatory Visit: Payer: BC Managed Care – PPO | Admitting: Family Medicine

## 2023-06-12 VITALS — BP 130/64 | HR 67 | Temp 97.5°F | Ht 70.0 in | Wt 267.0 lb

## 2023-06-12 DIAGNOSIS — M1711 Unilateral primary osteoarthritis, right knee: Secondary | ICD-10-CM | POA: Diagnosis not present

## 2023-06-12 DIAGNOSIS — Z7984 Long term (current) use of oral hypoglycemic drugs: Secondary | ICD-10-CM

## 2023-06-12 DIAGNOSIS — E1165 Type 2 diabetes mellitus with hyperglycemia: Secondary | ICD-10-CM

## 2023-06-12 MED ORDER — TRIAMCINOLONE ACETONIDE 40 MG/ML IJ SUSP
40.0000 mg | Freq: Once | INTRAMUSCULAR | Status: AC
Start: 2023-06-12 — End: 2023-06-12
  Administered 2023-06-12: 40 mg via INTRA_ARTICULAR

## 2023-06-12 NOTE — Patient Instructions (Signed)
Voltaren 1% gel, over the counter ?You can apply up to 4 times a day ? ?This can be applied to any joint: knee, wrist, fingers, elbows, shoulders, feet and ankles. ?Can apply to any tendon: tennis elbow, achilles, tendon, rotator cuff or any other tendon. ? ?Minimal is absorbed in the bloodstream: ok with oral anti-inflammatory or a blood thinner. ? ?Cost is about 9 dollars  ?

## 2023-06-27 ENCOUNTER — Other Ambulatory Visit: Payer: Self-pay | Admitting: Family Medicine

## 2023-06-27 NOTE — Telephone Encounter (Signed)
Last DM f/u was 10/05/22 pt has had recent acute appt but no future f/u or DM f/u and last A1c was 10/05/22

## 2023-06-27 NOTE — Telephone Encounter (Signed)
Please schedule annual exam with fasting labs prior

## 2023-06-28 NOTE — Telephone Encounter (Signed)
Lvm for patient tcb and schedule 

## 2023-07-23 ENCOUNTER — Other Ambulatory Visit: Payer: Self-pay | Admitting: Family Medicine

## 2023-07-23 NOTE — Telephone Encounter (Signed)
LVM to cb and schedule

## 2023-07-23 NOTE — Telephone Encounter (Signed)
Pt's due for his CPE (labs prior), please schedule and then route back to me to refill

## 2023-07-23 NOTE — Telephone Encounter (Signed)
Please schedule an appt even if it's 3 months from now. I have to have an appt on the books before I can refill med

## 2023-07-23 NOTE — Telephone Encounter (Signed)
Patient called back and stated that he is working out of town, and it will be 3 months before he is able to get in to see Dr. Milinda Antis.

## 2023-07-24 NOTE — Telephone Encounter (Signed)
Patient has been scheduled

## 2023-07-27 ENCOUNTER — Other Ambulatory Visit: Payer: Self-pay | Admitting: Family Medicine

## 2023-08-06 DIAGNOSIS — M25562 Pain in left knee: Secondary | ICD-10-CM | POA: Diagnosis not present

## 2023-08-06 DIAGNOSIS — M25561 Pain in right knee: Secondary | ICD-10-CM | POA: Diagnosis not present

## 2023-08-06 DIAGNOSIS — G8929 Other chronic pain: Secondary | ICD-10-CM | POA: Diagnosis not present

## 2023-08-06 DIAGNOSIS — Z96643 Presence of artificial hip joint, bilateral: Secondary | ICD-10-CM | POA: Diagnosis not present

## 2023-08-21 DIAGNOSIS — Z961 Presence of intraocular lens: Secondary | ICD-10-CM | POA: Diagnosis not present

## 2023-08-21 DIAGNOSIS — E119 Type 2 diabetes mellitus without complications: Secondary | ICD-10-CM | POA: Diagnosis not present

## 2023-08-21 DIAGNOSIS — D3132 Benign neoplasm of left choroid: Secondary | ICD-10-CM | POA: Diagnosis not present

## 2023-08-21 DIAGNOSIS — Z9889 Other specified postprocedural states: Secondary | ICD-10-CM | POA: Diagnosis not present

## 2023-08-21 LAB — HM DIABETES EYE EXAM

## 2023-09-26 ENCOUNTER — Other Ambulatory Visit: Payer: Self-pay

## 2023-09-26 ENCOUNTER — Telehealth: Payer: Self-pay | Admitting: Cardiovascular Disease

## 2023-09-26 ENCOUNTER — Emergency Department (HOSPITAL_COMMUNITY)
Admission: EM | Admit: 2023-09-26 | Discharge: 2023-09-26 | Disposition: A | Discharge status: Home / Self Care | Payer: BC Managed Care – PPO | Attending: Emergency Medicine | Admitting: Emergency Medicine

## 2023-09-26 ENCOUNTER — Telehealth: Payer: Self-pay | Admitting: Cardiology

## 2023-09-26 ENCOUNTER — Emergency Department (HOSPITAL_COMMUNITY): Payer: BC Managed Care – PPO

## 2023-09-26 DIAGNOSIS — Z7982 Long term (current) use of aspirin: Secondary | ICD-10-CM | POA: Diagnosis not present

## 2023-09-26 DIAGNOSIS — I2510 Atherosclerotic heart disease of native coronary artery without angina pectoris: Secondary | ICD-10-CM | POA: Insufficient documentation

## 2023-09-26 DIAGNOSIS — I209 Angina pectoris, unspecified: Secondary | ICD-10-CM

## 2023-09-26 DIAGNOSIS — R110 Nausea: Secondary | ICD-10-CM | POA: Insufficient documentation

## 2023-09-26 DIAGNOSIS — E119 Type 2 diabetes mellitus without complications: Secondary | ICD-10-CM | POA: Insufficient documentation

## 2023-09-26 DIAGNOSIS — I214 Non-ST elevation (NSTEMI) myocardial infarction: Secondary | ICD-10-CM | POA: Diagnosis not present

## 2023-09-26 DIAGNOSIS — R079 Chest pain, unspecified: Secondary | ICD-10-CM

## 2023-09-26 DIAGNOSIS — Z79899 Other long term (current) drug therapy: Secondary | ICD-10-CM | POA: Diagnosis not present

## 2023-09-26 DIAGNOSIS — M79602 Pain in left arm: Secondary | ICD-10-CM | POA: Insufficient documentation

## 2023-09-26 DIAGNOSIS — I252 Old myocardial infarction: Secondary | ICD-10-CM | POA: Diagnosis not present

## 2023-09-26 DIAGNOSIS — I10 Essential (primary) hypertension: Secondary | ICD-10-CM | POA: Diagnosis present

## 2023-09-26 DIAGNOSIS — R0789 Other chest pain: Secondary | ICD-10-CM | POA: Insufficient documentation

## 2023-09-26 LAB — BASIC METABOLIC PANEL
Anion gap: 9 (ref 5–15)
BUN: 18 mg/dL (ref 6–20)
CO2: 23 mmol/L (ref 22–32)
Calcium: 9.3 mg/dL (ref 8.9–10.3)
Chloride: 102 mmol/L (ref 98–111)
Creatinine, Ser: 1.01 mg/dL (ref 0.61–1.24)
GFR, Estimated: >60 mL/min (ref >60)
Glucose, Bld: 220 mg/dL — ABNORMAL HIGH (ref 70–99)
Potassium: 4.4 mmol/L (ref 3.5–5.1)
Sodium: 134 mmol/L — ABNORMAL LOW (ref 135–145)

## 2023-09-26 LAB — TROPONIN I (HIGH SENSITIVITY)
Troponin I (High Sensitivity): 8 ng/L (ref <18)
Troponin I (High Sensitivity): 6 ng/L (ref <18)

## 2023-09-26 LAB — CBC
HCT: 45.3 % (ref 39.0–52.0)
Hemoglobin: 15.2 g/dL (ref 13.0–17.0)
MCH: 31.7 pg (ref 26.0–34.0)
MCHC: 33.6 g/dL (ref 30.0–36.0)
MCV: 94.6 fL (ref 80.0–100.0)
Platelets: 87 10*3/uL — ABNORMAL LOW (ref 150–400)
RBC: 4.79 MIL/uL (ref 4.22–5.81)
RDW: 11.9 % (ref 11.5–15.5)
WBC: 4.8 10*3/uL (ref 4.0–10.5)
nRBC: 0 % (ref 0.0–0.2)

## 2023-09-26 NOTE — Telephone Encounter (Signed)
Wilson, Jasmin B15 minutes ago (9:03 AM)   JW Patient returned RN's call.      Note   Sulser, Kahlee Cookson [phone]  Erskine Squibb B   Pt advised... he will go to the ED to be seen.

## 2023-09-26 NOTE — Telephone Encounter (Signed)
Hey can you please call patient tomorrow and let him know that we ordered a stress test and help me schedule this with him.  He is leaving the emergency room now and likely is not going to get a AVS.

## 2023-09-26 NOTE — Consult Note (Addendum)
Cardiology Consultation   Patient ID: [name] MRN: [id number]; DOB: [birth year]  Admit date: 09/26/2023 Date of Consult: 09/26/2023  PCP:  Daniel Pimple, MD   [facility] Providers Cardiologist:  Daniel Carrow, MD   {  Patient Profile:   Daniel [name] is a 57 y.o. male with a hx of CAD status post NSTEMI May 2017 DES to RCA, hypertension, diabetes, CVA who is being seen 09/26/2023 for the evaluation of chest pain at the request of Dr. Rhae [name].  History of Present Illness:   [name] had an NSTEMI in 2017 with DES to the RCA with no other residual disease noted.  Echocardiogram was reported to be normal.  Also had chest pain in 2018 with negative workup in the ED, stress test was recommended but canceled by patient.  He was last seen in September 2022 and did not note any issues.  Currently patient is being evaluated for an episode of chest pain that started this morning associated with nausea.  No associated symptoms such as shortness of breath, diaphoresis, or palpitations.  Reports it being a dull pain similar to his prior MI.  Pain is not reproducible.  Not changed with position.  Not tender to palpation. He is a Recruitment consultant and had reported an episode of arm pain that has persisted about a week on the medial side of his bicep that extended down to his elbow.  States that he had no precipitating or inciting injury that caused this.  He states that he was sitting when it occurred.  Today's episode of chest pain and prior symptoms of left arm pain resembled his previous MI so he sought emergency care. Workup has been unremarkable.  EKG shows no acute ST-T wave changes.  Troponins negative x 2.  Chest x-ray negative.  No lab abnormalities.    He denies any heart failure symptoms such as shortness of breath, peripheral edema, orthopnea, PND, dizziness, apneic spells, snoring.  He has done very well from a cardiac perspective since his stent placed in 2007.  He  walks around without assistance and has a very laborious job denies any issues performing this.  Reports compliancy with all of his medications.  Past Medical History:  Diagnosis Date   Arthritis    CAD (coronary artery disease)    a. NSTEMI: 100% stenosis of the distal RCA --> DES placed 5/17 CLEARED BY CARDIOLOGIST   Chronic low back pain    a. s/p back surgery.   CVA (cerebral infarction)    a. 02/2015 dyarthria 2/2 CVA involving the lateral aspect of the precentral gyrus.   DM2 (diabetes mellitus, type 2) (HCC)    a. pre-diabetic in the past. b. A1c 03/2016 elevated to 7.9.   ETOH abuse    GERD (gastroesophageal reflux disease)    History of echocardiogram    a. Mild LVH, EF 55-60%, Definity contrast used-LV wall motion could not be adequately assessed   History of non-ST elevation myocardial infarction (NSTEMI) 03/2016   a. PCI: 3.5 x 24 mm Promus Premier DES to distal RCA   History of tobacco abuse    Hypertension    a. 02/2015 echo: 55-65%, trace TR/MR. b. 03/2016: echo with EF of 55-60%.    Myocardial infarction [facility])    Obesity    Ruptured disk    Stroke [facility])    2016    Past Surgical History:  Procedure Laterality Date   BACK SURGERY  2000   ruptured disk,  L-S   CARDIAC CATHETERIZATION  05/2004   minimal CAD   CARDIAC CATHETERIZATION N/A 03/10/2016   Procedure: Left Heart Cath and Coronary Angiography;  Surgeon: Daniel Hazel, MD;  Location: [facility] INVASIVE CV LAB;  Service: Cardiovascular;  Laterality: N/A;   CARDIAC CATHETERIZATION N/A 03/10/2016   Procedure: Coronary Stent Intervention;  Surgeon: Daniel Hazel, MD;  Location: [facility] INVASIVE CV LAB;  Service: Cardiovascular;  Laterality: N/A;   CATARACT EXTRACTION W/PHACO Left 07/12/2016   Procedure: CATARACT EXTRACTION PHACO AND INTRAOCULAR LENS PLACEMENT (IOC);  Surgeon: Daniel Crane, MD;  Location: [facility] ORS;  Service: Ophthalmology;  Laterality: Left;  [place]  00:50 AP% 9.2 CDE 4.63 fluid pack lot #  [id number]   CATARACT EXTRACTION W/PHACO Right 10/23/2021   Procedure: CATARACT EXTRACTION PHACO AND INTRAOCULAR LENS PLACEMENT (IOC) RIGHT DIABETIC;  Surgeon: Daniel Crane, MD;  Location: [facility];  Service: Ophthalmology;  Laterality: Right;  Diabetic 1.15 00:16.4   CORONARY ANGIOPLASTY     STENT 5/17   TOTAL HIP ARTHROPLASTY Left 06/03/2019   Procedure: TOTAL HIP ARTHROPLASTY;  Surgeon: Daniel Heinz, MD;  Location: [facility] ORS;  Service: Orthopedics;  Laterality: Left;   TOTAL HIP ARTHROPLASTY Right 03/30/2020   Procedure: TOTAL HIP ARTHROPLASTY;  Surgeon: Daniel Heinz, MD;  Location: [facility] ORS;  Service: Orthopedics;  Laterality: Right;  posterior      Inpatient Medications: Scheduled Meds:  Continuous Infusions:  PRN Meds:   Allergies:    Allergies  Allergen Reactions   Ace Inhibitors Cough    Social History:   Social History   Socioeconomic History   Marital status: Married    Spouse name: Not on file   Number of children: 1   Years of education: Not on file   Highest education level: GED or equivalent  Occupational History   Occupation: Haematologist  Tobacco Use   Smoking status: Former    Current packs/day: 0.00    Types: Cigarettes    Quit date: 01/18/2015    Years since quitting: 8.6   Smokeless tobacco: Never  Vaping Use   Vaping status: Never Used  Substance and Sexual Activity   Alcohol use: Yes    Alcohol/week: 12.0 standard drinks of alcohol    Types: 12 Cans of beer per week    Comment: weekends   Drug use: No   Sexual activity: Yes    Partners: Female  Other Topics Concern   Not on file  Social History Narrative   Lives in [place] with wife.  Does not routinely exercise.   Social Determinants of Health   Financial Resource Strain: Low Risk  (06/08/2023)   Overall Financial Resource Strain (CARDIA)    Difficulty of Paying Living Expenses: Not very hard  Food Insecurity: Patient Declined (06/08/2023)   Hunger Vital Sign     Worried About Running Out of Food in the Last Year: Patient declined    Ran Out of Food in the Last Year: Patient declined  Transportation Needs: No Transportation Needs (06/08/2023)   PRAPARE - Administrator, Civil Service (Medical): No    Lack of Transportation (Non-Medical): No  Physical Activity: Unknown (06/08/2023)   Exercise Vital Sign    Days of Exercise per Week: Patient declined    Minutes of Exercise per Session: Not on file  Stress: No Stress Concern Present (06/08/2023)   [employer] of Occupational Health - Occupational Stress Questionnaire    Feeling of Stress : Not at all  Social Connections: Unknown (06/08/2023)   Social Connection and Isolation Panel [NHANES]    Frequency of Communication with Friends and Family: Twice a week    Frequency of Social Gatherings with Friends and Family: Three times a week    Attends Religious Services: Patient declined    Active Member of Clubs or Organizations: No    Attends Banker Meetings: Not on file    Marital Status: Married  Intimate Partner Violence: Unknown (02/09/2022)   Received from [employer], [facility]   HITS    Physically Hurt: Not on file    Insult or Talk Down To: Not on file    Threaten Physical Harm: Not on file    Scream or Curse: Not on file    Family History:   Family History  Problem Relation Age of Onset   Dementia Mother    Hypertension Father    Heart disease Father        MI   Cancer Maternal Grandmother        lung   Heart failure Paternal Grandfather      ROS:  Please see the history of present illness.  All other ROS reviewed and negative.     Physical Exam/Data:   Vitals:   09/26/23 1031 09/26/23 1031 09/26/23 1145 09/26/23 1345  BP:  (!) 145/88 123/69 (!) 115/98  Pulse:  80 64 70  Resp:  18 (!) 21 15  Temp:  (!) 97.5 F (36.4 C)  97.7 F (36.5 C)  TempSrc:    Oral  SpO2:  100% 97% 98%  Weight: 122.5 kg     Height: 5\' 11"  (1.803 m)      No intake or  output data in the 24 hours ending 09/26/23 1545    09/26/2023   10:31 AM 06/12/2023   10:26 AM 06/06/2023    8:49 AM  Last 3 Weights  Weight (lbs) 270 lb 267 lb 273 lb  Weight (kg) 122.471 kg 121.11 kg 123.832 kg     Body mass index is 37.66 kg/m.  General:  Well nourished, well developed, in no acute distress HEENT: normal Neck: no JVD Vascular: No carotid bruits; Distal pulses 2+ bilaterally Cardiac:  normal S1, S2; RRR; no murmur  Lungs:  clear to auscultation bilaterally, no wheezing, rhonchi or rales  Abd: soft, nontender, no hepatomegaly  Ext: no edema Musculoskeletal:  No deformities, BUE and BLE strength normal and equal Skin: warm and dry  Neuro:  CNs 2-12 intact, no focal abnormalities noted Psych:  Normal affect   EKG:  The EKG was personally reviewed and demonstrates: Normal sinus rhythm, heart rate 79.  Noted acute ST-T wave changes. Telemetry:  Telemetry was personally reviewed and demonstrates: Normal sinus rhythm heart rates in the 70s to 80s  Relevant CV Studies: Echocardiogram 03/11/2016 Procedure narrative: Transthoracic echocardiography. Image    quality was adequate. The study was technically difficult, as a    result of poor sound wave transmission. Intravenous contrast    (Definity) was administered.  - Left ventricle: The cavity size was mildly dilated. Wall    thickness was increased in a pattern of mild LVH. Systolic    function was normal. The estimated ejection fraction was in the    range of 55% to 60%. Images were inadequate for LV wall motion    assessment. The study is not technically sufficient to allow    evaluation of LV diastolic function.  - Left atrium: The atrium was normal in  size.  - Inferior vena cava: The vessel was normal in size. The    respirophasic diameter changes were in the normal range (>= 50%),    consistent with normal central venous pressure.   Impressions:   - Technically difficult study with poor echo windows. Definity     contrast was minimally helpful. There appears to be grossly    normal LV systolic function, however, I cannot fully evaluate    wall motion.   Prox RCA lesion, 30% stenosed. Dist RCA lesion, 100% stenosed. Post intervention, there is a 0% residual stenosis.  Left heart catheterization 03/10/2016 1. NSTEMI secondary to occluded distal RCA 2. Successful PTCA/DES of the distal RCA 3. No obstructive disease in the LAD or Circumflex 4. Mild LV systolic dysfunction.    Recommendations: Will continue DAPT with ASA and Brilinta. Will continue statin, beta blocker. Echo tomorrow. Admit to stepdown.   Laboratory Data:  High Sensitivity Troponin:   Recent Labs  Lab 09/26/23 1034 09/26/23 1246  TROPONINIHS 8 6     Chemistry Recent Labs  Lab 09/26/23 1034  NA 134*  K 4.4  CL 102  CO2 23  GLUCOSE 220*  BUN 18  CREATININE 1.01  CALCIUM 9.3  GFRNONAA >60  ANIONGAP 9    No results for input(s): "PROT", "ALBUMIN", "AST", "ALT", "ALKPHOS", "BILITOT" in the last 168 hours. Lipids No results for input(s): "CHOL", "TRIG", "HDL", "LABVLDL", "LDLCALC", "CHOLHDL" in the last 168 hours.  Hematology Recent Labs  Lab 09/26/23 1034  WBC 4.8  RBC 4.79  HGB 15.2  HCT 45.3  MCV 94.6  MCH 31.7  MCHC 33.6  RDW 11.9  PLT 87*   Thyroid No results for input(s): "TSH", "FREET4" in the last 168 hours.  BNPNo results for input(s): "BNP", "PROBNP" in the last 168 hours.  DDimer No results for input(s): "DDIMER" in the last 168 hours.   Radiology/Studies:  DG Chest 2 View  Result Date: 09/26/2023 CLINICAL DATA:  Chest pain. EXAM: CHEST - 2 VIEW COMPARISON:  08/29/2007. FINDINGS: Bilateral lung fields are clear. Bilateral costophrenic angles are clear. Normal cardio-mediastinal silhouette. No acute osseous abnormalities. The soft tissues are within normal limits. IMPRESSION: *No active cardiopulmonary disease. Electronically Signed   By: Jules Schick M.D.   On: 09/26/2023 11:38      Assessment and Plan:   Chest pain CAD status post DES to RCA 2017 Previous DES to RCA in 2017, no obstructive disease noted in the LAD or circumflex.  Reporting a one-time episode of chest pain that occurred this morning associated with nausea.  Also had been reporting 1 week of left-sided medial bicep pain while sitting with no inciting injury. EKG showing no acute ST-T wave changes, troponins negative x 2.  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  Given overall negative evaluation I think it is reasonable to observe and follow-up outpatient with possible stress testing if symptoms continue to persist. Continue aspirin.  Was previously on carvedilol and a statin.  He is not on this for unknown reasons but would recommend reconciling his med list and making adjustments outpatient.  He is antsy and wants to leave now.    Risk Assessment/Risk Scores:       For questions or updates, please contact [facility] Please consult [email or web address] for contact info under    Signed, Abagail Kitchens, PA-C  09/26/2023  3:45 PM

## 2023-09-26 NOTE — Telephone Encounter (Signed)
Patient returned RN's call. 

## 2023-09-26 NOTE — ED Notes (Signed)
Phlebotomy to collect troponin

## 2023-09-26 NOTE — Telephone Encounter (Signed)
See other open encounter.  

## 2023-09-26 NOTE — ED Triage Notes (Signed)
Pt. 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

## 2023-09-26 NOTE — ED Provider Notes (Signed)
I was asked to follow-up on the recommendations from the cardiology consult for the patient who has had a favorable workup in the emergency department thus far.  Physical Exam  BP (!) 115/98 (BP Location: Right Arm)   Pulse 70   Temp 97.7 F (36.5 C) (Oral)   Resp 15   Ht 5\' 11"  (1.803 m)   Wt 122.5 kg   SpO2 98%   BMI 37.66 kg/m   Physical Exam  Procedures  Procedures  ED Course / MDM   Clinical Course as of 09/26/23 1611  Thu Sep 26, 2023  1100 BP(!): 145/88 Mildly hypertensive but otherwise hemodynamically stable  [AB]  1141 CXR without evidence of acute disease [AB]  1202 Troponin I (High Sensitivity) Troponin reassuring; however, with previous NSTEMI will wait and see what second troponin is  [AB]  1204 Platelets(!): 87 Thrombocytopenia slightly lower than his baseline. Has history of thrombocytopenia  [AB]  1436 Troponin I (High Sensitivity): 6 Troponin decreased  [AB]    Clinical Course User Index [AB] Lockie Mola, MD   Medical Decision Making Amount and/or Complexity of Data Reviewed Labs: ordered. Decision-making details documented in ED Course. Radiology: ordered.   The patient was evaluated by cardiology and they recommended outpatient follow-up.  They are setting up an outpatient stress test for him.  He will be discharged with return precautions.       Durwin Glaze, MD 09/26/23 [id number]

## 2023-09-26 NOTE — Telephone Encounter (Signed)
Left message to call back  (In message I did say that I hope he is on his way to the ED, but to call back to discuss)

## 2023-09-26 NOTE — ED Notes (Signed)
Patient discharged by this RN. Patient verbalizes understanding of instructions with no additional questions for this RN. Ambulatory to lobby at discharge.

## 2023-09-26 NOTE — ED Notes (Signed)
Upon arrival of NT to room, patient states "the doctor said I'm good to go,I'm not keeping these cords on." Patient encouraged to remain on cardiac monitor until discharge, with refusal by patient.

## 2023-09-26 NOTE — ED Provider Notes (Signed)
[place] EMERGENCY DEPARTMENT AT [facility] Provider Note   CSN: [id number] Arrival date & time: 09/26/23  1025     History CAD S/P NSTEMI 03/2016 DES RCA in 2018  H/o CVA in 2016  Former tobacco use  HTN HLD T2DM  Chief Complaint  Patient presents with   Chest Pain   Arm Pain   Nausea    Daniel [name] is a 57 y.o. male.  Patient reports he had left upper arm squeezing pain for the last 2 weeks. Says it was mostly constant but would ease off when he rested at night. 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 However, today morning he started having chest tightness with diaphoresis and nausea and felt exactly like his heart attack in 2018.  He has been taking aspirin daily and follows with Heart care. He called his cardiologist this morning who told him to come to the hospital.  Denies palpitations, shortness of breath, BLE edema, light headedness, syncope.    The history is provided by the patient and a relative.  Chest Pain Pain location:  L chest Pain radiates to:  L shoulder and L arm Associated symptoms: diaphoresis and nausea   Associated symptoms: no abdominal pain, no fatigue, no fever, no palpitations, no shortness of breath and no vomiting   Arm Pain Associated symptoms include chest pain. Pertinent negatives include no abdominal pain and no shortness of breath.       Home Medications Prior to Admission medications   Medication Sig Start Date End Date Taking? Authorizing Provider  amLODipine (NORVASC) 5 MG tablet TAKE 1 TABLET (5 MG TOTAL) BY MOUTH DAILY. 07/29/23   Tower, Audrie Gallus, MD  aspirin EC 81 MG tablet Take 81 mg by mouth daily. Swallow whole.    [provider]  atorvastatin (LIPITOR) 80 MG tablet TAKE 1 TABLET BY MOUTH EVERY DAY AT 6PM. 08/21/22   Kathleene Hazel, MD  celecoxib (CELEBREX) 200 MG capsule Take 200 mg by mouth daily.    [provider]  glipiZIDE (GLUCOTROL XL) 2.5 MG  24 hr tablet TAKE 1 TABLET BY MOUTH EVERY DAY WITH BREAKFAST 07/24/23   Tower, Audrie Gallus, MD  hydrochlorothiazide (HYDRODIURIL) 25 MG tablet TAKE 1/2 TABLET BY MOUTH DAILY 07/29/23   Tower, Audrie Gallus, MD  metFORMIN (GLUCOPHAGE) 1000 MG tablet TAKE 1 TABLET (1,000 MG TOTAL) BY MOUTH TWICE A DAY WITH FOOD 07/29/23   Tower, Audrie Gallus, MD  nitroGLYCERIN (NITROSTAT) 0.4 MG SL tablet Place 1 tablet (0.4 mg total) under the tongue every 5 (five) minutes as needed for chest pain (CP or SOB). 03/12/16   Strader, Lennart Pall, PA-C  Probiotic Product (PROBIOTIC PO) Take 1 capsule by mouth daily.    [provider]  sildenafil (VIAGRA) 100 MG tablet TAKE 0.5-1 TABLETS BY MOUTH DAILY AS NEEDED FOR ERECTILE DYSFUNCTION. 07/24/23   Tower, Audrie Gallus, MD  valsartan (DIOVAN) 80 MG tablet TAKE 1 TABLET BY MOUTH EVERY DAY 07/29/23   Tower, Audrie Gallus, MD      Allergies    Ace inhibitors    Review of Systems   Review of Systems  Constitutional:  Positive for diaphoresis. Negative for appetite change, chills, fatigue, fever and unexpected weight change.  Respiratory:  Positive for chest tightness. Negative for shortness of breath.   Cardiovascular:  Positive for chest pain. Negative for palpitations and leg swelling.  Gastrointestinal:  Positive for nausea. Negative for abdominal distention, abdominal pain,  blood in stool, constipation and vomiting.  Musculoskeletal:  Negative for neck pain.       Left arm pain     Physical Exam Updated Vital Signs BP (!) 115/98 (BP Location: Right Arm)   Pulse 70   Temp 97.7 F (36.5 C) (Oral)   Resp 15   Ht 5\' 11"  (1.803 m)   Wt 122.5 kg   SpO2 98%   BMI 37.66 kg/m  Physical Exam Constitutional:      General: He is not in acute distress.    Appearance: He is not diaphoretic.  Eyes:     Extraocular Movements: Extraocular movements intact.     Pupils: Pupils are equal, round, and reactive to light.  Neck:     Vascular: No hepatojugular reflux or JVD.  Cardiovascular:      Rate and Rhythm: Normal rate and regular rhythm.     Pulses:          Carotid pulses are 2+ on the right side and 2+ on the left side.      Radial pulses are 2+ on the right side and 2+ on the left side.       Dorsalis pedis pulses are 2+ on the right side and 2+ on the left side.       Posterior tibial pulses are 2+ on the right side and 2+ on the left side.     Heart sounds: Normal heart sounds.  Pulmonary:     Effort: Pulmonary effort is normal.     Breath sounds: Normal breath sounds.  Chest:     Chest wall: No tenderness.  Abdominal:     Palpations: Abdomen is soft.     Tenderness: There is no abdominal tenderness.     Comments: Protuberant but soft   Musculoskeletal:     Cervical back: Normal range of motion.     Right lower leg: No tenderness. No edema.     Left lower leg: No tenderness. No edema.  Skin:    General: Skin is warm and dry.     Capillary Refill: Capillary refill takes less than 2 seconds.  Neurological:     General: No focal deficit present.     Mental Status: He is alert and oriented to person, place, and time.     ED Results / Procedures / Treatments   Labs (all labs ordered are listed, but only abnormal results are displayed) Labs Reviewed  BASIC METABOLIC PANEL - Abnormal; Notable for the following components:      Result Value   Sodium 134 (*)    Glucose, Bld 220 (*)    All other components within normal limits  CBC - Abnormal; Notable for the following components:   Platelets 87 (*)    All other components within normal limits  TROPONIN I (HIGH SENSITIVITY)  TROPONIN I (HIGH SENSITIVITY)    EKG EKG Interpretation Date/Time:  Thursday September 26 2023 10:06:53 EST Ventricular Rate:  79 PR Interval:  178 QRS Duration:  94 QT Interval:  376 QTC Calculation: 431 R Axis:   -8  Text Interpretation: Normal sinus rhythm Inferior infarct , age undetermined Possible Anterior infarct , age undetermined Abnormal ECG No significant change since  last tracing Confirmed by Melene Plan [id number]) on 09/26/2023 11:35:08 AM  Radiology DG Chest 2 View  Result Date: 09/26/2023 CLINICAL DATA:  Chest pain. EXAM: CHEST - 2 VIEW COMPARISON:  08/29/2007. FINDINGS: Bilateral lung fields are clear. Bilateral costophrenic angles are clear. Normal cardio-mediastinal silhouette.  No acute osseous abnormalities. The soft tissues are within normal limits. IMPRESSION: *No active cardiopulmonary disease. Electronically Signed   By: Jules Schick M.D.   On: 09/26/2023 11:38    Procedures Procedures   Medications Ordered in ED Medications - No data to display  ED Course/ Medical Decision Making/ A&P Clinical Course as of 09/26/23 1454  Thu Sep 26, 2023  1100 BP(!): 145/88 Mildly hypertensive but otherwise hemodynamically stable  [AB]  1141 CXR without evidence of acute disease [AB]  1202 Troponin I (High Sensitivity) Troponin reassuring; however, with previous NSTEMI will wait and see what second troponin is  [AB]  1204 Platelets(!): 87 Thrombocytopenia slightly lower than his baseline. Has history of thrombocytopenia  [AB]  1436 Troponin I (High Sensitivity): 6 Troponin decreased  [AB]    Clinical Course User Index [AB] Lockie Mola, MD             HEART Score: 5                    Medical Decision Making This patient presents to the ED with chief complaint(s) of left arm and chest pain with tightness with pertinent past medical history of NSTEMI with DES in 2018, CVA, HTN, HLD which further complicates the presenting complaint. The complaint involves an extensive differential diagnosis and also carries with it a high risk of complications and morbidity.    The differential diagnosis includes ACS, dissection, PE, GERD, muscle strain.    Additional history obtained: Additional history obtained from family Records reviewed previous admission documents and Primary Care Documents   Independent labs interpretation:  The following labs were  independently interpreted: troponin was wnl and trended down, Otherwise normal or stable labs. Thrombocytopenia slightly less than prior but no signs of bleeding or bruising.   Independent visualization of imaging: - I independently visualized the following imaging with scope of interpretation limited to determining acute life threatening conditions related to emergency care: CXR, which revealed no acute cardiopulmonary disease from imaging standpoint. No pleural effusions, dissection, percardial effusion   Consultation: - Consulted or discussed management/test interpretation w/ external professional: Called Dr. Beulah Gandy regarding patient's history and presentation. She recommended that Cardiology official consult despite one normal troponin given patient is high risk.   Consideration for admission or further workup: Patient may need cardiac cath or stress test. He is hemodynamically stable at this time but is high risk. Cardiology consult will inform further workup or admission.   Social Determinants of health: None  Patient was handed off to the next ED physician.     Amount and/or Complexity of Data Reviewed External Data Reviewed: labs, radiology, ECG and notes. Labs: ordered. Decision-making details documented in ED Course. Radiology: ordered and independent interpretation performed. Decision-making details documented in ED Course. ECG/medicine tests: ordered and independent interpretation performed. Decision-making details documented in ED Course.  Final Clinical Impression(s) / ED Diagnoses Final diagnoses:  Chest tightness  CVD (cardiovascular disease)  History of non-ST elevation myocardial infarction (NSTEMI)      Lockie Mola, MD 09/26/23 1454    Melene Plan, DO 09/26/23 1520

## 2023-09-26 NOTE — Telephone Encounter (Signed)
   Pt c/o of Chest Pain: STAT if active CP, including tightness, pressure, jaw pain, radiating pain to shoulder/upper arm/back, CP unrelieved by Nitro. Symptoms reported of SOB, nausea, vomiting, sweating.  1. Are you having CP right now? no    2. Are you experiencing any other symptoms (ex. SOB, nausea, vomiting, sweating)? Nausea, left arm pain   3. Is your CP continuous or coming and going? Comes and goes    4. Have you taken Nitroglycerin? no   5. How long have you been experiencing CP? 1 week     6. If NO CP at time of call then end call with telling Pt to call back or call 911 if Chest pain returns prior to return call from triage team.

## 2023-09-26 NOTE — Discharge Instructions (Signed)
Please follow-up with cardiology as discussed.  Return to the ER for worsening symptoms.

## 2023-09-30 NOTE — Telephone Encounter (Signed)
Cardiac PET NM instructions sent through the patient portal.  Sent message to precert.  The order was placed by cardiology before patient left the ER.

## 2023-10-01 ENCOUNTER — Telehealth: Payer: Self-pay

## 2023-10-01 NOTE — Telephone Encounter (Signed)
Aware, thanks!

## 2023-10-01 NOTE — Transitions of Care (Post Inpatient/ED Visit) (Signed)
Unable to reach patient by phone and left v/m requesting call back at [phone].        10/01/2023  Name: DONNIE GHILARDI MRN: [id number] DOB: Jan 04, 1966  Today's TOC FU Call Status: Today's TOC FU Call Status:: Unsuccessful Call (1st Attempt) Unsuccessful Call (1st Attempt) Date: 10/01/23  Attempted to reach the patient regarding the most recent Inpatient/ED visit.  Follow Up Plan: Additional outreach attempts will be made to reach the patient to complete the Transitions of Care (Post Inpatient/ED visit) call.   Signature Lewanda Rife, LPN

## 2023-10-01 NOTE — Transitions of Care (Post Inpatient/ED Visit) (Signed)
pt said seen [place] on 09/26/23 with CP; pt has hx of heart issues. pt said dx with "gas".pt already has stress test appt scheduled with cardiology., pt said is feeling fine now; pt has CPX scheduled with Dr Milinda Antis on 10/23/23 and pt is already scheduled for stress test; pt has appt with Perlie Gold PA HFU on 12/026/24. UC & ED precautions given and pt voiced understanding.senfding note to Dr Milinda Antis.      10/01/2023  Name: Daniel [name] MRN: [id number] DOB: 13-Sep-1966  Today's TOC FU Call Status: Today's TOC FU Call Status:: Successful TOC FU Call Completed Unsuccessful Call (1st Attempt) Date: 10/01/23 [facility] FU Call Complete Date: 10/01/23 Patient's Name and Date of Birth confirmed.  Transition Care Management Follow-up Telephone Call Date of Discharge: 09/26/23 Discharge Facility: [facility]) Type of Discharge: Emergency Department Reason for ED Visit: Cardiac Conditions Cardiac Conditions Diagnosis: Chest Pain Persisting (pt said seen [place] on 09/26/23 with CP; pt has hx of heart issues. pt said dx with "gas".pt already has stress test appt svhrduled witn cardiology.,) How have you been since you were released from the hospital?: Better Any questions or concerns?: No  Items Reviewed: Did you receive and understand the discharge instructions provided?: Yes Medications obtained,verified, and reconciled?: No Medications Not Reviewed Reasons:: Other: (pt could only speak for a minute with me and pt was not given any new meds at ED visit.) Any new allergies since your discharge?: No Dietary orders reviewed?: NA Do you have support at home?: Yes People in Home: spouse Name of Support/Comfort Primary Source: Gina  Medications Reviewed Today: Medications Reviewed Today   Medications were not reviewed in this encounter     Home Care and Equipment/Supplies: Were Home Health Services Ordered?: NA Any new equipment or medical supplies ordered?: NA  Functional  Questionnaire: Do you need assistance with meal preparation?: No Do you need assistance with eating?: No Do you have difficulty maintaining continence: No Do you need assistance with getting out of bed/getting out of a chair/moving?: No Do you have difficulty managing or taking your medications?: No  Follow up appointments reviewed: PCP Follow-up appointment confirmed?: Yes Date of PCP follow-up appointment?: 09/23/23 Follow-up Provider: CPX with Dr Idamae Schuller [facility] Follow-up appointment confirmed?: Yes Date of Specialist follow-up appointment?: 10/23/23 Follow-Up Specialty Provider:: Evn Williaims PA with cardiology; pt already scheduledf for stress test. Do you need transportation to your follow-up appointment?: No Do you understand care options if your condition(s) worsen?: Yes-patient verbalized understanding    SIGNATURE Lewanda Rife, LPN

## 2023-10-01 NOTE — Telephone Encounter (Signed)
error 

## 2023-10-09 ENCOUNTER — Ambulatory Visit
Admission: RE | Admit: 2023-10-09 | Discharge: 2023-10-09 | Disposition: A | Discharge status: Home / Self Care | Payer: BC Managed Care – PPO | Source: Ambulatory Visit | Attending: Cardiology | Admitting: Cardiology

## 2023-10-09 DIAGNOSIS — R079 Chest pain, unspecified: Secondary | ICD-10-CM | POA: Insufficient documentation

## 2023-10-09 MED ORDER — TECHNETIUM TC 99M TETROFOSMIN IV KIT
30.0500 | PACK | Freq: Once | INTRAVENOUS | Status: AC | PRN
  Administered 2023-10-09: 30.05 via INTRAVENOUS

## 2023-10-09 MED ORDER — TECHNETIUM TC 99M TETROFOSMIN IV KIT
10.0000 | PACK | Freq: Once | INTRAVENOUS | Status: AC | PRN
  Administered 2023-10-09: 10.85 via INTRAVENOUS

## 2023-10-11 LAB — NM MYOCAR MULTI W/SPECT W/WALL MOTION / EF
Angina Index: 0
Duke Treadmill Score: 6
Estimated workload: 7
Exercise duration (min): 5 min
Exercise duration (sec): 58 s
LV dias vol: 76 mL (ref 62–150)
LV sys vol: 24 mL
MPHR: 161 {beats}/min
Nuc Stress EF: 68 %
Peak HR: 150 {beats}/min
Percent HR: 93 %
Rest HR: 65 {beats}/min
Rest Nuclear Isotope Dose: 10.9 mCi
SDS: 3
SRS: 5
SSS: 2
ST Depression (mm): 0 mm
Stress Nuclear Isotope Dose: 30.1 mCi
TID: 0.87

## 2023-10-23 ENCOUNTER — Ambulatory Visit (INDEPENDENT_AMBULATORY_CARE_PROVIDER_SITE_OTHER): Payer: BC Managed Care – PPO | Admitting: Family Medicine

## 2023-10-23 ENCOUNTER — Encounter: Payer: Self-pay | Admitting: Family Medicine

## 2023-10-23 VITALS — BP 129/80 | HR 64 | Temp 98.1°F | Ht 68.5 in | Wt 263.1 lb

## 2023-10-23 DIAGNOSIS — E1165 Type 2 diabetes mellitus with hyperglycemia: Secondary | ICD-10-CM

## 2023-10-23 DIAGNOSIS — E785 Hyperlipidemia, unspecified: Secondary | ICD-10-CM | POA: Diagnosis not present

## 2023-10-23 DIAGNOSIS — D696 Thrombocytopenia, unspecified: Secondary | ICD-10-CM | POA: Diagnosis not present

## 2023-10-23 DIAGNOSIS — E1169 Type 2 diabetes mellitus with other specified complication: Secondary | ICD-10-CM | POA: Diagnosis not present

## 2023-10-23 DIAGNOSIS — R7401 Elevation of levels of liver transaminase levels: Secondary | ICD-10-CM

## 2023-10-23 DIAGNOSIS — E66812 Obesity, class 2: Secondary | ICD-10-CM

## 2023-10-23 DIAGNOSIS — Z23 Encounter for immunization: Secondary | ICD-10-CM

## 2023-10-23 DIAGNOSIS — I10 Essential (primary) hypertension: Secondary | ICD-10-CM

## 2023-10-23 DIAGNOSIS — E538 Deficiency of other specified B group vitamins: Secondary | ICD-10-CM

## 2023-10-23 DIAGNOSIS — N400 Enlarged prostate without lower urinary tract symptoms: Secondary | ICD-10-CM | POA: Diagnosis not present

## 2023-10-23 DIAGNOSIS — Z0000 Encounter for general adult medical examination without abnormal findings: Secondary | ICD-10-CM

## 2023-10-23 DIAGNOSIS — L84 Corns and callosities: Secondary | ICD-10-CM

## 2023-10-23 DIAGNOSIS — Z6838 Body mass index (BMI) 38.0-38.9, adult: Secondary | ICD-10-CM

## 2023-10-23 LAB — COMPREHENSIVE METABOLIC PANEL
ALT: 161 U/L — ABNORMAL HIGH (ref 0–53)
AST: 135 U/L — ABNORMAL HIGH (ref 0–37)
Albumin: 4.1 g/dL (ref 3.5–5.2)
Alkaline Phosphatase: 151 U/L — ABNORMAL HIGH (ref 39–117)
BUN: 17 mg/dL (ref 6–23)
CO2: 25 meq/L (ref 19–32)
Calcium: 9.3 mg/dL (ref 8.4–10.5)
Chloride: 100 meq/L (ref 96–112)
Creatinine, Ser: 0.81 mg/dL (ref 0.40–1.50)
GFR: 96.42 mL/min (ref >60.00)
Glucose, Bld: 194 mg/dL — ABNORMAL HIGH (ref 70–99)
Potassium: 4.3 meq/L (ref 3.5–5.1)
Sodium: 134 meq/L — ABNORMAL LOW (ref 135–145)
Total Bilirubin: 0.7 mg/dL (ref 0.2–1.2)
Total Protein: 6.9 g/dL (ref 6.0–8.3)

## 2023-10-23 LAB — LIPID PANEL
Cholesterol: 176 mg/dL (ref 0–200)
HDL: 35.9 mg/dL — ABNORMAL LOW (ref >39.00)
LDL Cholesterol: 122 mg/dL — ABNORMAL HIGH (ref 0–99)
NonHDL: 140.02
Total CHOL/HDL Ratio: 5
Triglycerides: 88 mg/dL (ref 0.0–149.0)
VLDL: 17.6 mg/dL (ref 0.0–40.0)

## 2023-10-23 LAB — CBC WITH DIFFERENTIAL/PLATELET
Basophils Absolute: 0.1 10*3/uL (ref 0.0–0.1)
Basophils Relative: 1.4 % (ref 0.0–3.0)
Eosinophils Absolute: 0.3 10*3/uL (ref 0.0–0.7)
Eosinophils Relative: 7.2 % — ABNORMAL HIGH (ref 0.0–5.0)
HCT: 45.6 % (ref 39.0–52.0)
Hemoglobin: 15.4 g/dL (ref 13.0–17.0)
Lymphocytes Relative: 20.6 % (ref 12.0–46.0)
Lymphs Abs: 1 10*3/uL (ref 0.7–4.0)
MCHC: 33.7 g/dL (ref 30.0–36.0)
MCV: 95.6 fL (ref 78.0–100.0)
Monocytes Absolute: 0.4 10*3/uL (ref 0.1–1.0)
Monocytes Relative: 9.5 % (ref 3.0–12.0)
Neutro Abs: 2.8 10*3/uL (ref 1.4–7.7)
Neutrophils Relative %: 61.3 % (ref 43.0–77.0)
Platelets: 87 10*3/uL — ABNORMAL LOW (ref 150.0–400.0)
RBC: 4.78 Mil/uL (ref 4.22–5.81)
RDW: 12.9 % (ref 11.5–15.5)
WBC: 4.6 10*3/uL (ref 4.0–10.5)

## 2023-10-23 LAB — MICROALBUMIN / CREATININE URINE RATIO
Creatinine,U: 79.6 mg/dL
Microalb Creat Ratio: 9.2 mg/g (ref 0.0–30.0)
Microalb, Ur: 7.3 mg/dL — ABNORMAL HIGH (ref 0.0–1.9)

## 2023-10-23 LAB — PSA: PSA: 0.28 ng/mL (ref 0.10–4.00)

## 2023-10-23 LAB — TSH: TSH: 2.45 u[IU]/mL (ref 0.35–5.50)

## 2023-10-23 LAB — VITAMIN B12: Vitamin B-12: >1537 pg/mL — ABNORMAL HIGH (ref 211–911)

## 2023-10-23 LAB — HEMOGLOBIN A1C: Hgb A1c MFr Bld: 9.1 % — ABNORMAL HIGH (ref 4.6–6.5)

## 2023-10-23 MED ORDER — METFORMIN HCL 1000 MG PO TABS: 1000.0000 mg | ORAL_TABLET | Freq: Two times a day (BID) | ORAL | 3 refills | Status: DC

## 2023-10-23 MED ORDER — HYDROCHLOROTHIAZIDE 25 MG PO TABS: 12.5000 mg | ORAL_TABLET | Freq: Every day | ORAL | 3 refills | Status: DC

## 2023-10-23 MED ORDER — AMLODIPINE BESYLATE 5 MG PO TABS: 5.0000 mg | ORAL_TABLET | Freq: Every day | ORAL | 3 refills | Status: DC

## 2023-10-23 MED ORDER — GLIPIZIDE ER 2.5 MG PO TB24: ORAL_TABLET | ORAL | 1 refills | Status: DC

## 2023-10-23 MED ORDER — VALSARTAN 80 MG PO TABS: 80.0000 mg | ORAL_TABLET | Freq: Every day | ORAL | 3 refills | Status: DC

## 2023-10-23 NOTE — Assessment & Plan Note (Signed)
Cbc today  No bleeding or bruising  Does take asa  Takes celebrex for joint pain (per pt cannot function without)

## 2023-10-23 NOTE — Progress Notes (Signed)
Subjective:    Patient ID: Daniel [name], male    DOB: 1966/03/23, 57 y.o.   MRN: [id number]  HPI  Here for health maintenance exam and to review chronic medical problems   Wt Readings from Last 3 Encounters:  10/23/23 263 lb 2 oz (119.4 kg)  09/26/23 270 lb (122.5 kg)  06/12/23 267 lb (121.1 kg)   39.43 kg/m  Vitals:   10/23/23 0946 10/23/23 1017  BP: 134/82 129/80  Pulse: 64   Temp: 98.1 F (36.7 C)   SpO2: 96%     Immunization History  Administered Date(s) Administered   Influenza Split 08/05/2018   Influenza-Unspecified 09/19/2013, 07/06/2016, 07/09/2017, 07/21/2023   Moderna Sars-Covid-2 Vaccination 09/13/2020, 12/06/2020   PNEUMOCOCCAL CONJUGATE-20 10/23/2023   Pneumococcal Polysaccharide-23 11/16/2013   Td 10/23/2023   Tdap 11/14/2012    Health Maintenance Due  Topic Date Due   HEMOGLOBIN A1C  04/06/2023   Diabetic kidney evaluation - Urine ACR  07/03/2023   FOOT EXAM  07/03/2023   Lung cancer screening  Smoking status -previous / quit in 2016  -still craves them   Due for tetanus shot next month   May be interested in shingrix    Prostate health Lab Results  Component Value Date   PSA 0.15 07/02/2022   PSA 0.14 01/22/2020   PSA 0.26 12/26/2018  History of BPH  Stream is not slowed down  Gets up frequently in the night - every 3 hours  Interested in trial of flomax   No prostate cancer in family    Colon cancer screening  Declines any colon cancer screening    Bone health   Falls-none  Fractures-none  Supplements  B12   Exercise  On feet and lifting heavy at work    Mood    10/23/2023    9:52 AM 01/08/2023   11:55 AM 03/11/2020    9:18 AM 12/31/2018   10:06 AM 07/12/2017   10:19 AM  Depression screen PHQ 2/9  Decreased Interest 0 0 0 0 0  Down, Depressed, Hopeless 0 0 0 0 0  PHQ - 2 Score 0 0 0 0 0  Altered sleeping 0 0 0    Tired, decreased energy 0 0 0    Change in appetite 0 0 0    Feeling bad or failure about  yourself  0 0 0    Trouble concentrating 0 0 0    Moving slowly or fidgety/restless 0 0 0    Suicidal thoughts 0 0 0    PHQ-9 Score 0 0 0    Difficult doing work/chores Not difficult at all Not difficult at all Not difficult at all     HTN bp is stable today  No cp or palpitations or headaches or edema  No side effects to medicines  BP Readings from Last 3 Encounters:  10/23/23 129/80  09/26/23 (!) 115/98  06/12/23 130/64    In setting of CAD with NSTEMI in past and also CVA  Asa 81 mg daily  Coreg 6.25 mg bid Amlodipine 5 mg daily  Diovan 80 mg daily Hctz 12.5 mg daily  Had ER visit for cp  Work up was ok    Lab Results  Component Value Date   NA 134 (L) 09/26/2023   K 4.4 09/26/2023   CO2 23 09/26/2023   GLUCOSE 220 (H) 09/26/2023   BUN 18 09/26/2023   CREATININE 1.01 09/26/2023   CALCIUM 9.3 09/26/2023   GFR 97.31 07/02/2022  GFRNONAA >60 09/26/2023    DM2 Lab Results  Component Value Date   HGBA1C 6.8 (A) 10/05/2022  Overdue for follow up  Metformin 1000 mg bid Glipizide xl 2.5 mg daily   Glucose goes up and down    Eats fruit for his sweet cravings   Hyperlipidemia Lab Results  Component Value Date   CHOL 83 07/02/2022   HDL 32.40 (L) 07/02/2022   LDLCALC 35 07/02/2022   TRIG 75.0 07/02/2022   CHOLHDL 3 07/02/2022   Was prevention on statin / atorvastatin 80 He stopped it due to muscle pain  Will see cardiology on 12/31    History of low plt Lab Results  Component Value Date   WBC 4.8 09/26/2023   HGB 15.2 09/26/2023   HCT 45.3 09/26/2023   MCV 94.6 09/26/2023   PLT 87 (L) 09/26/2023   History of low B12 Due for labs     Patient Active Problem List   Diagnosis Date Noted   Callus of foot 10/23/2023   Angina pectoris (HCC) 09/26/2023   Class 2 obesity due to excess calories with body mass index (BMI) of 38.0 to 38.9 in adult 04/14/2021   H/O total hip arthroplasty 03/30/2020   Status post total replacement of left hip  06/03/2019   Thrombocytopenia (HCC) 12/31/2018   Left hip pain 12/15/2018   Type 2 diabetes mellitus with hyperglycemia, without long-term current use of insulin (HCC) 07/22/2017   Coronary artery disease involving native coronary artery of native heart without angina pectoris 03/20/2016   History of non-ST elevation myocardial infarction (NSTEMI) 03/20/2016   Hyperlipidemia associated with type 2 diabetes mellitus (HCC) 03/12/2016   NSTEMI (non-ST elevated myocardial infarction) (HCC) 03/10/2016   Prostate cancer screening 02/10/2016   H/O: CVA (cerebrovascular accident) 03/01/2015   Right knee pain 04/06/2014   BPH (benign prostatic hyperplasia) 11/16/2013   Routine general medical examination at a health care facility 11/12/2013   B12 deficiency 09/29/2007   ERECTILE DYSFUNCTION 07/01/2007   History of tobacco abuse 07/01/2007   LOW BACK PAIN, CHRONIC 07/01/2007   Primary hypertension 06/03/2007   Past Medical History:  Diagnosis Date   Arthritis    CAD (coronary artery disease)    a. NSTEMI: 100% stenosis of the distal RCA --> DES placed 5/17 CLEARED BY CARDIOLOGIST   Chronic low back pain    a. s/p back surgery.   CVA (cerebral infarction)    a. 02/2015 dyarthria 2/2 CVA involving the lateral aspect of the precentral gyrus.   DM2 (diabetes mellitus, type 2) (HCC)    a. pre-diabetic in the past. b. A1c 03/2016 elevated to 7.9.   ETOH abuse    GERD (gastroesophageal reflux disease)    History of echocardiogram    a. Mild LVH, EF 55-60%, Definity contrast used-LV wall motion could not be adequately assessed   History of non-ST elevation myocardial infarction (NSTEMI) 03/2016   a. PCI: 3.5 x 24 mm Promus Premier DES to distal RCA   History of tobacco abuse    Hypertension    a. 02/2015 echo: 55-65%, trace TR/MR. b. 03/2016: echo with EF of 55-60%.    Myocardial infarction [facility])    Obesity    Ruptured disk    Stroke [facility])    2016   Past Surgical History:  Procedure  Laterality Date   BACK SURGERY  2000   ruptured disk, L-S   CARDIAC CATHETERIZATION  05/2004   minimal CAD   CARDIAC CATHETERIZATION N/A 03/10/2016   Procedure:  Left Heart Cath and Coronary Angiography;  Surgeon: Kathleene Hazel, MD;  Location: [facility] INVASIVE CV LAB;  Service: Cardiovascular;  Laterality: N/A;   CARDIAC CATHETERIZATION N/A 03/10/2016   Procedure: Coronary Stent Intervention;  Surgeon: Kathleene Hazel, MD;  Location: [facility] INVASIVE CV LAB;  Service: Cardiovascular;  Laterality: N/A;   CATARACT EXTRACTION W/PHACO Left 07/12/2016   Procedure: CATARACT EXTRACTION PHACO AND INTRAOCULAR LENS PLACEMENT (IOC);  Surgeon: Nevada Crane, MD;  Location: [facility] ORS;  Service: Ophthalmology;  Laterality: Left;  [place]  00:50 AP% 9.2 CDE 4.63 fluid pack lot # [id number]   CATARACT EXTRACTION W/PHACO Right 10/23/2021   Procedure: CATARACT EXTRACTION PHACO AND INTRAOCULAR LENS PLACEMENT (IOC) RIGHT DIABETIC;  Surgeon: Nevada Crane, MD;  Location: [facility];  Service: Ophthalmology;  Laterality: Right;  Diabetic 1.15 00:16.4   CORONARY ANGIOPLASTY     STENT 5/17   TOTAL HIP ARTHROPLASTY Left 06/03/2019   Procedure: TOTAL HIP ARTHROPLASTY;  Surgeon: Donato Heinz, MD;  Location: [facility] ORS;  Service: Orthopedics;  Laterality: Left;   TOTAL HIP ARTHROPLASTY Right 03/30/2020   Procedure: TOTAL HIP ARTHROPLASTY;  Surgeon: Donato Heinz, MD;  Location: [facility] ORS;  Service: Orthopedics;  Laterality: Right;  posterior    Social History   Tobacco Use   Smoking status: Former    Current packs/day: 0.00    Types: Cigarettes    Quit date: 01/18/2015    Years since quitting: 8.7   Smokeless tobacco: Never  Vaping Use   Vaping status: Never Used  Substance Use Topics   Alcohol use: Yes    Alcohol/week: 12.0 standard drinks of alcohol    Types: 12 Cans of beer per week    Comment: weekends   Drug use: No   Family History  Problem Relation Age of Onset   Dementia Mother     Hypertension Father    Heart disease Father        MI   Cancer Maternal Grandmother        lung   Heart failure Paternal Grandfather    Allergies  Allergen Reactions   Ace Inhibitors Cough   Current Outpatient Medications on File Prior to Visit  Medication Sig Dispense Refill   amLODipine (NORVASC) 5 MG tablet TAKE 1 TABLET (5 MG TOTAL) BY MOUTH DAILY. 90 tablet 0   aspirin EC 81 MG tablet Take 81 mg by mouth daily. Swallow whole.     celecoxib (CELEBREX) 200 MG capsule Take 200 mg by mouth daily.     cyanocobalamin (VITAMIN B12) 1000 MCG tablet Take 1,000 mcg by mouth daily.     glipiZIDE (GLUCOTROL XL) 2.5 MG 24 hr tablet TAKE 1 TABLET BY MOUTH EVERY DAY WITH BREAKFAST 90 tablet 0   hydrochlorothiazide (HYDRODIURIL) 25 MG tablet TAKE 1/2 TABLET BY MOUTH DAILY 45 tablet 0   metFORMIN (GLUCOPHAGE) 1000 MG tablet TAKE 1 TABLET (1,000 MG TOTAL) BY MOUTH TWICE A DAY WITH FOOD 180 tablet 0   nitroGLYCERIN (NITROSTAT) 0.4 MG SL tablet Place 1 tablet (0.4 mg total) under the tongue every 5 (five) minutes as needed for chest pain (CP or SOB). 25 tablet 3   Probiotic Product (PROBIOTIC PO) Take 1 capsule by mouth daily.     sildenafil (VIAGRA) 100 MG tablet TAKE 0.5-1 TABLETS BY MOUTH DAILY AS NEEDED FOR ERECTILE DYSFUNCTION. 5 tablet 5   valsartan (DIOVAN) 80 MG tablet TAKE 1 TABLET BY MOUTH EVERY DAY 90 tablet 0   No current facility-administered medications  on file prior to visit.    Review of Systems  Constitutional:  Positive for fatigue. Negative for activity change, appetite change, fever and unexpected weight change.  HENT:  Negative for congestion, rhinorrhea, sore throat and trouble swallowing.   Eyes:  Negative for pain, redness, itching and visual disturbance.  Respiratory:  Negative for cough, chest tightness, shortness of breath and wheezing.   Cardiovascular:  Negative for chest pain and palpitations.  Gastrointestinal:  Negative for abdominal pain, blood in stool,  constipation, diarrhea and nausea.  Endocrine: Negative for cold intolerance, heat intolerance, polydipsia and polyuria.  Genitourinary:  Negative for difficulty urinating, dysuria, frequency and urgency.  Musculoskeletal:  Positive for arthralgias. Negative for joint swelling and myalgias.  Skin:  Negative for pallor and rash.  Neurological:  Negative for dizziness, tremors, weakness, numbness and headaches.  Hematological:  Negative for adenopathy. Does not bruise/bleed easily.  Psychiatric/Behavioral:  Negative for decreased concentration and dysphoric mood. The patient is not nervous/anxious.        Objective:   Physical Exam Constitutional:      General: He is not in acute distress.    Appearance: Normal appearance. He is well-developed. He is obese. He is not ill-appearing or diaphoretic.  HENT:     Head: Normocephalic and atraumatic.     Right Ear: Tympanic membrane, ear canal and external ear normal.     Left Ear: Tympanic membrane, ear canal and external ear normal.     Nose: Nose normal. No congestion.     Mouth/Throat:     Mouth: Mucous membranes are moist.     Pharynx: Oropharynx is clear. No posterior oropharyngeal erythema.  Eyes:     General: No scleral icterus.       Right eye: No discharge.        Left eye: No discharge.     Conjunctiva/sclera: Conjunctivae normal.     Pupils: Pupils are equal, round, and reactive to light.  Neck:     Thyroid: No thyromegaly.     Vascular: No carotid bruit or JVD.  Cardiovascular:     Rate and Rhythm: Normal rate and regular rhythm.     Pulses: Normal pulses.     Heart sounds: Normal heart sounds.     No gallop.  Pulmonary:     Effort: Pulmonary effort is normal. No respiratory distress.     Breath sounds: Normal breath sounds. No wheezing or rales.     Comments: Good air exch Chest:     Chest wall: No tenderness.  Abdominal:     General: Bowel sounds are normal. There is no distension or abdominal bruit.     Palpations:  Abdomen is soft. There is no mass.     Tenderness: There is no abdominal tenderness.     Hernia: No hernia is present.  Musculoskeletal:        General: No tenderness.     Cervical back: Normal range of motion and neck supple. No rigidity. No muscular tenderness.     Right lower leg: No edema.     Left lower leg: No edema.  Lymphadenopathy:     Cervical: No cervical adenopathy.  Skin:    General: Skin is warm and dry.     Coloration: Skin is not pale.     Findings: No erythema or rash.     Comments: Ruddy complexion Some sks Solar lentigines diffusely   Neurological:     Mental Status: He is alert.     Cranial  Nerves: No cranial nerve deficit.     Motor: No abnormal muscle tone.     Coordination: Coordination normal.     Gait: Gait normal.     Deep Tendon Reflexes: Reflexes are normal and symmetric. Reflexes normal.  Psychiatric:        Mood and Affect: Mood normal.        Cognition and Memory: Cognition and memory normal.           Assessment & Plan:   Problem List Items Addressed This Visit       Cardiovascular and Mediastinum   Primary hypertension   bp in fair control at this time  BP Readings from Last 1 Encounters:  10/23/23 129/80   No changes needed Most recent labs reviewed  Disc lifstyle change with low sodium diet and exercise  Coreg 6.25 mg bid Amlodipine 5 mg daily  Diovan 80 mg daily Hctz 12.5 mg daily  Continues cardiology f;u for this and CAD        Endocrine   Type 2 diabetes mellitus with hyperglycemia, without long-term current use of insulin (HCC)   A1c today  Glucose is labile at home Metformin 1000 mg bid  Glipizide xl 2.5 mg daily-interested in change to glp-1  Disc option of GLP medication including possible side effects like GI intolerance and risk of thyroid and endocrine cancer, pancreatitis and gallstones, kidney problems and diabetic retinopathy  Pending labs will send in ozempic 0.25 mg weekly       Relevant Orders    Hemoglobin A1c   Microalbumin / creatinine urine ratio   Hyperlipidemia associated with type 2 diabetes mellitus (HCC)   Disc goals for lipids and reasons to control them Rev last labs with pt Rev low sat fat diet in detail Lab today  Off atorvastatin due to muscle pain  He plans to d/w cardiology later this month   Will need lipid lowering treatment       Relevant Orders   Comprehensive metabolic panel   Lipid Panel     Musculoskeletal and Integument   Callus of foot   Lateral feet In diabetic  Causes pain Ref to podiatry for help with treatment       Relevant Orders   Ambulatory referral to Podiatry     Genitourinary   BPH (benign prostatic hyperplasia)   Psa ordered More freq urination  ? Due to this vs elevated glucose  Consider flomax  Pending labs      Relevant Orders   PSA     Hematopoietic and Hemostatic   Thrombocytopenia (HCC)   Cbc today  No bleeding or bruising  Does take asa  Takes celebrex for joint pain (per pt cannot function without)      Relevant Orders   CBC with Differential/Platelet     Other   Routine general medical examination at a health care facility - Primary   Reviewed health habits including diet and exercise and skin cancer prevention Reviewed appropriate screening tests for age  Also reviewed health mt list, fam hx and immunization status , as well as social and family history   See HPI Labs reviewed and ordered Td today  Prevnar 20 today  Plans to check on coverage of shingrix  Psa added to labs  Declines all colon cancer screening  Discussed fall prevention, supplements and exercise for bone density  Phq 0 Health Maintenance  Topic Date Due   Screening for Lung Cancer  03/12/2017   Hemoglobin A1C  04/06/2023   Yearly kidney health urinalysis for diabetes  07/03/2023   Complete foot exam   07/03/2023   COVID-19 Vaccine (3 - 2024-25 season) 11/08/2023*   HIV Screening  12/08/2023*   Zoster (Shingles) Vaccine (1  of 2) 01/21/2024*   Colon Cancer Screening  12/31/2028*   Eye exam for diabetics  08/20/2024   Yearly kidney function blood test for diabetes  09/25/2024   DTaP/Tdap/Td vaccine (3 - Td or Tdap) 10/22/2033   Flu Shot  Completed   Hepatitis C Screening  Completed   HPV Vaccine  Aged Out  *Topic was postponed. The date shown is not the original due date.         Class 2 obesity due to excess calories with body mass index (BMI) of 38.0 to 38.9 in adult   Discussed how this problem influences overall health and the risks it imposes  Reviewed plan for weight loss with lower calorie diet (via better food choices (lower glycemic and portion control) along with exercise building up to or more than 30 minutes 5 days per week including some aerobic activity and strength training    Considering glp-1 med to help  Is diabetic  HTN and cad Also arthritis pain  Weight loss would help      B12 deficiency   Relevant Orders   Vitamin B12   Other Visit Diagnoses       HYPERTENSION, BENIGN ESSENTIAL       Relevant Orders   CBC with Differential/Platelet   TSH   Lipid Panel     Need for Td vaccine       Relevant Orders   Td : Tetanus/diphtheria >7yo Preservative  free (Completed)     Need for pneumococcal 20-valent conjugate vaccination       Relevant Orders   Pneumococcal conjugate vaccine 20-valent (Prevnar 20) (Completed)

## 2023-10-23 NOTE — Assessment & Plan Note (Signed)
Discussed how this problem influences overall health and the risks it imposes  Reviewed plan for weight loss with lower calorie diet (via better food choices (lower glycemic and portion control) along with exercise building up to or more than 30 minutes 5 days per week including some aerobic activity and strength training    Considering glp-1 med to help  Is diabetic  HTN and cad Also arthritis pain  Weight loss would help

## 2023-10-23 NOTE — Assessment & Plan Note (Signed)
Disc goals for lipids and reasons to control them Rev last labs with pt Rev low sat fat diet in detail Lab today  Off atorvastatin due to muscle pain  He plans to d/w cardiology later this month   Will need lipid lowering treatment

## 2023-10-23 NOTE — Assessment & Plan Note (Signed)
Reviewed health habits including diet and exercise and skin cancer prevention Reviewed appropriate screening tests for age  Also reviewed health mt list, fam hx and immunization status , as well as social and family history   See HPI Labs reviewed and ordered Td today  Prevnar 20 today  Plans to check on coverage of shingrix  Psa added to labs  Declines all colon cancer screening  Discussed fall prevention, supplements and exercise for bone density  Phq 0 Health Maintenance  Topic Date Due   Screening for Lung Cancer  03/12/2017   Hemoglobin A1C  04/06/2023   Yearly kidney health urinalysis for diabetes  07/03/2023   Complete foot exam   07/03/2023   COVID-19 Vaccine (3 - 2024-25 season) 11/08/2023*   HIV Screening  12/08/2023*   Zoster (Shingles) Vaccine (1 of 2) 01/21/2024*   Colon Cancer Screening  12/31/2028*   Eye exam for diabetics  08/20/2024   Yearly kidney function blood test for diabetes  09/25/2024   DTaP/Tdap/Td vaccine (3 - Td or Tdap) 10/22/2033   Flu Shot  Completed   Hepatitis C Screening  Completed   HPV Vaccine  Aged Out  *Topic was postponed. The date shown is not the original due date.

## 2023-10-23 NOTE — Assessment & Plan Note (Signed)
Psa ordered More freq urination  ? Due to this vs elevated glucose  Consider flomax  Pending labs

## 2023-10-23 NOTE — Assessment & Plan Note (Signed)
bp in fair control at this time  BP Readings from Last 1 Encounters:  10/23/23 129/80   No changes needed Most recent labs reviewed  Disc lifstyle change with low sodium diet and exercise  Coreg 6.25 mg bid Amlodipine 5 mg daily  Diovan 80 mg daily Hctz 12.5 mg daily  Continues cardiology f;u for this and CAD

## 2023-10-23 NOTE — Assessment & Plan Note (Signed)
Lateral feet In diabetic  Causes pain Ref to podiatry for help with treatment

## 2023-10-23 NOTE — Patient Instructions (Addendum)
Tetanus shot (Td) today  Also pneumonia vaccine   Labs today   If you are interested in the shingles vaccine series (Shingrix), call your insurance or pharmacy to check on coverage and location it must be given.  If affordable - you can schedule it here or at your pharmacy depending on coverage   Let's see how labs and blood sugar look before we decide on any prostate or diabetes medicine changes   Read about the GLP-1 medicines for diabetes and weight  We may start one   Talk to your cardiologist about cholesterol Avoid red meat/ fried foods/ egg yolks/ fatty breakfast meats/ butter, cheese and high fat dairy/ and shellfish   I put the referral in for podiatry for your feet  Please let us know if you don't hear in 1-2 weeks

## 2023-10-23 NOTE — Assessment & Plan Note (Signed)
A1c today  Glucose is labile at home Metformin 1000 mg bid  Glipizide xl 2.5 mg daily-interested in change to glp-1  Disc option of GLP medication including possible side effects like GI intolerance and risk of thyroid and endocrine cancer, pancreatitis and gallstones, kidney problems and diabetic retinopathy  Pending labs will send in ozempic 0.25 mg weekly

## 2023-10-24 ENCOUNTER — Telehealth: Payer: Self-pay

## 2023-10-24 ENCOUNTER — Telehealth: Payer: Self-pay | Admitting: Family Medicine

## 2023-10-24 ENCOUNTER — Encounter: Payer: Self-pay | Admitting: *Deleted

## 2023-10-24 MED ORDER — OZEMPIC (0.25 OR 0.5 MG/DOSE) 2 MG/3ML ~~LOC~~ SOPN: 0.2500 mg | PEN_INJECTOR | SUBCUTANEOUS | 0 refills | Status: DC

## 2023-10-24 NOTE — Telephone Encounter (Signed)
Unable to reach patient. Left voicemail to return call to our office.   

## 2023-10-24 NOTE — Telephone Encounter (Signed)
Spoke with pt notifying him, per Dr Royden Purl MyChart message yesterday, she was ordering an [place] to check his liver. Pt verbalizes understanding, stating he missed that part of her message. Expresses his thanks for the call back.

## 2023-10-24 NOTE — Telephone Encounter (Signed)
Copied from CRM [id number]. Topic: Clinical - Medical Advice >> Oct 24, 2023  3:22 PM Florestine Avers wrote: Reason for CRM: Patient is requesting a call back from the nurse in regards to an test that was ordered for him.

## 2023-10-24 NOTE — Telephone Encounter (Signed)
See my chart encounter.

## 2023-10-24 NOTE — Telephone Encounter (Signed)
Called and spoke with patient, scheduled 2-3 week f/u. Patient states the ozempic is too expensive at $400. He would like to know if there any assistance he can get wit this or an alternative?

## 2023-10-24 NOTE — Telephone Encounter (Signed)
Please make sure he schedules follow up in 2-3 weeks  Thanks

## 2023-10-24 NOTE — Telephone Encounter (Signed)
Ozempic too expensive is there assistance, or can Dr Milinda Antis talk to the insurance to cover he cannot afford the 400/ month

## 2023-10-25 ENCOUNTER — Other Ambulatory Visit: Payer: Self-pay

## 2023-10-25 NOTE — Telephone Encounter (Signed)
Copied from CRM [id number]. Topic: Clinical - Prescription Issue >> Oct 25, 2023  8:20 AM Josefa Half C wrote: Reason for CRM: Patient requested to speak with provider about a cheaper alternative for his medication:Semaglutide,0.25 or 0.5MG /DOS, (OZEMPIC, 0.25 OR 0.5 MG/DOSE,) 2 MG/3ML SOPN. Please give PT a call back to follow up [phone], he requested to receive a text or voicemail if he is unable to answer the phone while he is working.

## 2023-10-25 NOTE — Telephone Encounter (Signed)
Did a prior auth get done?  Is it not covered? If not would mounjaro be preferred?  May have to ask pharmacist Thanks

## 2023-10-29 ENCOUNTER — Other Ambulatory Visit (HOSPITAL_COMMUNITY): Payer: Self-pay

## 2023-10-29 ENCOUNTER — Telehealth: Payer: Self-pay

## 2023-10-29 NOTE — Telephone Encounter (Signed)
Per test claim, medication was filled 10/24/2023 through the pt's insurance, meaning a PA isn't needed, pt may have deductible, unfortunately, the test claim doesn't give me this information and when I attempt to test claim Mounjaro, it rejects against the recently filled Ozempic.     Pharmacy Patient Advocate Encounter   Received notification from Patient Advice Request messages that prior authorization for Ozempic is required/requested.   Insurance verification completed.   The patient is insured through  [place]  .   Per test claim: Refill too soon. PA is not needed at this time. Medication was filled 10/24/2023. Next eligible fill date is Not given (member must use 80% of previously filled GLP1 per test claim.

## 2023-10-29 NOTE — Telephone Encounter (Signed)
Per test claim, medication was filled 10/24/2023 through the pt's insurance, meaning a PA isn't needed, pt may have deductible, unfortunately, the test claim doesn't give me this information and when I attempt to test claim Mounjaro, it rejects against the recently filled Ozempic.

## 2023-10-29 NOTE — Telephone Encounter (Signed)
Any progress on that ? Do we know if another GLP drug is covered instead of ozempic? Thanks

## 2023-10-31 ENCOUNTER — Ambulatory Visit: Payer: BC Managed Care – PPO | Admitting: Cardiology

## 2023-10-31 NOTE — Telephone Encounter (Signed)
Pt said he hasn't check with pharmacy to see if it's any cheaper. Pt said he will check to see and if not he will let us know. He is opened to the pharmacy referral but he wants to check with [facility]

## 2023-10-31 NOTE — Telephone Encounter (Signed)
So it sounds like the medication would be covered - is it the high deductible and copay that is causing the problem?  I really want to get him started on a GLP-1 med because weight loss would be helpful/ it is the next logical step  If the issue is high deductible then any medicine (all of the next choices) would be expensive   If we cannot rectify this would he be open to a pharmacist referral? That would be very helpful

## 2023-11-01 NOTE — Telephone Encounter (Signed)
See mychart message, $400 is what his co pay is with the PA approved.

## 2023-11-04 NOTE — Telephone Encounter (Signed)
I put the referral in for pharmacy services  Please let us know if you don't hear in 1-2 weeks

## 2023-11-05 ENCOUNTER — Ambulatory Visit
Admission: RE | Admit: 2023-11-05 | Discharge: 2023-11-05 | Disposition: A | Discharge status: Home / Self Care | Payer: BC Managed Care – PPO | Source: Ambulatory Visit | Attending: Family Medicine | Admitting: Family Medicine

## 2023-11-05 DIAGNOSIS — D696 Thrombocytopenia, unspecified: Secondary | ICD-10-CM

## 2023-11-05 DIAGNOSIS — R7401 Elevation of levels of liver transaminase levels: Secondary | ICD-10-CM

## 2023-11-05 NOTE — Telephone Encounter (Signed)
 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 They are on the insurance plan  Patient states he checked single care pharmacy card and can use the card at [facility] or [facility] and get ozempic  for $700 a month  Needs to be below $400 a month with other drugs that he takes  Advised patient of message regarding giving the pharmacist a couple of weeks to follow-up  Patient can be reached at [phone]

## 2023-11-07 ENCOUNTER — Other Ambulatory Visit: Payer: Self-pay | Admitting: Pharmacist

## 2023-11-07 DIAGNOSIS — E1165 Type 2 diabetes mellitus with hyperglycemia: Secondary | ICD-10-CM

## 2023-11-07 NOTE — Progress Notes (Signed)
   11/07/2023 Name: Daniel [name] MRN: [id number] DOB: [birth year]  Subjective  Chief Complaint  Patient presents with   Diabetes   Medication Access    Care Team: Primary Care Provider: Tower, Laine LABOR, MD  Reason for visit: ?  Daniel [name] is a 58 y.o. male who presents today for a telephone visit with the pharmacist due to medication access concerns regarding their Ozempic . ?   Medication Access: ?  - Current Patient Assistance: None; Commercial insurance - Prescription drug coverage: Payor: BLUE CROSS BLUE [employer]IELD / Plan: BCBS COMM PPO / Product Type: *No Product type* /  BIN: [id number] PCN: [id number] ID: [id number]  Ozempic  newly prescribed by PCP. Copay at the pharmacy was estimated to be ~$400. Called and spoke with pharmacy.  Cost $200 per month, $400 for 2 month.  HelpDesk number provided by pharmacy: [phone] [facility] claims)   Pharmacy Benefits / Help Desk: [phone] Plan Details per Pharmacy Help Desk:  - No deductible on this plan  - Ozempic  was processed as a Tier 2 Brand Medication - Brand medications are 50% copay with a $200/month maximum. Maximum applies for 2 and 34-month supplies as well (rep stated $400/90 day though suspect this may have been incorrect - test claim requested) - Generic medication is a flat $15 copay     Assessment and Plan:   1. Medication Access All brand-name medications through patient's insurance will be high cost (patient responsible for paying 50% of Brand price) All generic medications should be flat copay of $15  Patient reports that $200/month would be a feasible copay for him. Current Rx was showing as $400 because it was being billed x2 months rather than 1 month with the way it was written).   Clinically, Mounjaro  has greater weight and A1c-reduction propensity compared to Ozempic  and will possibly be cheaper for patient with copay card (assuming Mounjaro  is also price-capped). Mounjaro  test claim requested.    Considerations: Ozempic :  Minimum copay $200/month Ozempic  Copay card will reduce cost by maximum of $100/month, $200/37month, $300/59month.  Final cost Estimate = $100/monthMounjaro : Minimum copay $200/month (pending test claim) Mounjaro  Copay Card maximum monthly savings of up to $150/month, $300/33-month, or $450/81-month. maximum annual savings of up to $1800  Final cost Estimate = $50/month       Future Appointments  Date Time Provider Department Center  11/08/2023  9:30 AM Tower, Laine LABOR, MD [facility]  11/15/2023 10:00 AM Janit Thresa HERO, DPM [facility]  12/06/2023 10:30 AM Lucien Orren SAILOR, PA-C [facility]    Manuelita FABIENE Kobs, PharmD Clinical Pharmacist [facility] [phone]

## 2023-11-08 ENCOUNTER — Ambulatory Visit: Payer: BC Managed Care – PPO | Admitting: Family Medicine

## 2023-11-08 ENCOUNTER — Encounter: Payer: Self-pay | Admitting: Pharmacist

## 2023-11-08 ENCOUNTER — Encounter: Payer: Self-pay | Admitting: Family Medicine

## 2023-11-08 VITALS — BP 134/72 | HR 78 | Temp 97.6°F | Ht 68.5 in | Wt 267.4 lb

## 2023-11-08 DIAGNOSIS — K760 Fatty (change of) liver, not elsewhere classified: Secondary | ICD-10-CM | POA: Diagnosis not present

## 2023-11-08 DIAGNOSIS — D696 Thrombocytopenia, unspecified: Secondary | ICD-10-CM | POA: Diagnosis not present

## 2023-11-08 DIAGNOSIS — E1165 Type 2 diabetes mellitus with hyperglycemia: Secondary | ICD-10-CM

## 2023-11-08 DIAGNOSIS — R7401 Elevation of levels of liver transaminase levels: Secondary | ICD-10-CM

## 2023-11-08 DIAGNOSIS — J209 Acute bronchitis, unspecified: Secondary | ICD-10-CM

## 2023-11-08 DIAGNOSIS — Z7984 Long term (current) use of oral hypoglycemic drugs: Secondary | ICD-10-CM

## 2023-11-08 LAB — HEPATIC FUNCTION PANEL
ALT: 118 U/L — ABNORMAL HIGH (ref 0–53)
AST: 83 U/L — ABNORMAL HIGH (ref 0–37)
Albumin: 4.3 g/dL (ref 3.5–5.2)
Alkaline Phosphatase: 167 U/L — ABNORMAL HIGH (ref 39–117)
Bilirubin, Direct: 0.1 mg/dL (ref 0.0–0.3)
Total Bilirubin: 0.6 mg/dL (ref 0.2–1.2)
Total Protein: 7.7 g/dL (ref 6.0–8.3)

## 2023-11-08 MED ORDER — TIRZEPATIDE 2.5 MG/0.5ML ~~LOC~~ SOAJ: 2.5000 mg | SUBCUTANEOUS | 0 refills | Status: DC

## 2023-11-08 MED ORDER — DOXYCYCLINE HYCLATE 100 MG PO TABS: 100.0000 mg | ORAL_TABLET | Freq: Two times a day (BID) | ORAL | 0 refills | Status: DC

## 2023-11-08 NOTE — Patient Instructions (Addendum)
 Start mounjaro  2.5 mg weekly  If any intolerable side effects let us  know   Update me after your 3rd show regarding how you are doing so I know to send in the higher dose   If you start to get glucose readings around 100- it will be time to stop the glipizide    For now I need you to stop alcohol - I am concerned about liver  Stay off tylenol   Work on weight loss   Try to get most of your carbohydrates from produce (with the exception of white potatoes) and whole grains Eat less bread/pasta/rice/snack foods/cereals/sweets and other items from the middle of the grocery store (processed carbs)   Take doxycycline  for bronchitis  Mucinex DM if ok over the counter if needed    Liver labs today   Follow up in 3 months

## 2023-11-08 NOTE — Assessment & Plan Note (Signed)
 Reviewed last liver labs and us   Working on weight loss in past week and getting started hopefully on GLP-1 as well  No longer taking tylenol  Still on celebrex   Drinks 12 pk of light beer on weekends- states he can stop this w/o a problem Handout given  Will watch lft and plt closely  No abd symptoms  Will refer to GI if needed  Hoping this will improve with weight loss and etoh cessation

## 2023-11-08 NOTE — Assessment & Plan Note (Signed)
 Last plt ct 87 No bruising or bleeding  Has hepatic steatosis Stopping etoh   Will watch closely

## 2023-11-08 NOTE — Assessment & Plan Note (Signed)
 Lab Results  Component Value Date   HGBA1C 9.1 (H) 10/23/2023   Has stopped holiday eating Continues metformin  1000 mg bid Glipizide  xl 2.5 mg daily  Adding mounjaro  2.5 mg weekly (met with pharmacist yesterday to work on discount card /etc)  Disc option of GLP medication including possible side effects like GI intolerance and risk of thyroid  and endocrine cancer, pancreatitis and gallstones, kidney problems and diabetic retinopathy Handout given  Will update with 3rd shot and increase dose if tol Follow up 3 mo or earlier  Reviewed low glycemic diet /doing better this week

## 2023-11-08 NOTE — Progress Notes (Signed)
 Subjective:    Patient ID: Daniel [name], male    DOB: 03-16-1966, 58 y.o.   MRN: [id number]  HPI  Wt Readings from Last 3 Encounters:  11/08/23 267 lb 6 oz (121.3 kg)  10/23/23 263 lb 2 oz (119.4 kg)  09/26/23 270 lb (122.5 kg)   40.06 kg/m  Vitals:   11/08/23 0917  BP: 134/72  Pulse: 78  Temp: 97.6 F (36.4 C)  SpO2: 94%   Pt presents for follow up of chronic medical problems including DM2 and elevated LFTs  and obesity   Also uri since mid month  Cough -productive  Green sputum No fever  Did covid test-it was negative No wheezing  No over the counter meds    DM2 Lab Results  Component Value Date   HGBA1C 9.1 (H) 10/23/2023   Wanted to start semaglutide -ins does not cover Met with pharmacist and sounds like mounjaro  may be more affordable with a discount card   Eating more over holidays  Started back eating better this week  Stopped white potato , pasta and bread   Checking glucose - numbers are coming down a bit    Glipizide  xl 2.5 mg daily  Metformin  1000 mg bid   Lab Results  Component Value Date   MICROALBUR 7.3 (H) 10/23/2023   MICROALBUR 3.4 (H) 07/02/2022    Elevated LFTs Lab Results  Component Value Date   ALT 161 (H) 10/23/2023   AST 135 (H) 10/23/2023   ALKPHOS 151 (H) 10/23/2023   BILITOT 0.7 10/23/2023    Lab Results  Component Value Date   WBC 4.6 10/23/2023   HGB 15.4 10/23/2023   HCT 45.6 10/23/2023   MCV 95.6 10/23/2023   PLT 87.0 (L) 10/23/2023   Baseline thrombocytopenia   He drinks a 12 pack of beer on weekend/ light beer      They have been elevated on /off in past  US  noted steatosis  No gallbladder problems  US  Abdomen Complete (Accession [id number]) (Order [id number]) Imaging Date: 11/05/2023 Department: [facility]  Imaging Released By: Vicci Setter D Authorizing: Melven Stockard, Laine LABOR, MD   Exam Status  Status  Final [99]   PACS Intelerad Image Link   Show images for US  Abdomen Complete Study  Result  Narrative & Impression  CLINICAL DATA:  Elevated LFTs   EXAM: ABDOMEN ULTRASOUND COMPLETE   COMPARISON:  None Available.   FINDINGS: Gallbladder: No gallstones or wall thickening visualized. No sonographic Murphy sign noted by sonographer.   Common bile duct: Diameter: 3 mm   Liver: Increased echogenicity. No focal lesion. Portal vein is patent on color Doppler imaging with normal direction of blood flow towards the liver.   IVC: No abnormality visualized.   Pancreas: Visualized portion unremarkable.   Spleen: Size and appearance within normal limits.   Right Kidney: Length: 11.4 cm. Normal renal cortical thickness and echogenicity. No hydronephrosis. 8 mm cyst. No imaging follow-up needed.   Left Kidney: Length: 11.6 cm. Echogenicity within normal limits. No mass or hydronephrosis visualized.   Abdominal aorta: No aneurysm visualized.   Other findings: None.   IMPRESSION: 1. Increased hepatic parenchymal echogenicity suggestive of steatosis. 2. No cholelithiasis or sonographic evidence for acute cholecystitis.     Lab Results  Component Value Date   NA 134 (L) 10/23/2023   K 4.3 10/23/2023   CO2 25 10/23/2023   GLUCOSE 194 (H) 10/23/2023   BUN 17 10/23/2023   CREATININE 0.81 10/23/2023   CALCIUM  9.3 10/23/2023  GFR 96.42 10/23/2023   GFRNONAA >60 09/26/2023     No hepatitis No history of IV drugs at all     Patient Active Problem List   Diagnosis Date Noted   Hepatic steatosis 11/08/2023   Callus of foot 10/23/2023   Angina pectoris (HCC) 09/26/2023   Acute bronchitis 01/08/2023   Morbid obesity (HCC) 04/14/2021   H/O total hip arthroplasty 03/30/2020   Status post total replacement of left hip 06/03/2019   Thrombocytopenia (HCC) 12/31/2018   Left hip pain 12/15/2018   Type 2 diabetes mellitus with hyperglycemia, without long-term current use of insulin  (HCC) 07/22/2017   Elevated transaminase level 04/16/2016   Coronary artery  disease involving native coronary artery of native heart without angina pectoris 03/20/2016   History of non-ST elevation myocardial infarction (NSTEMI) 03/20/2016   Hyperlipidemia associated with type 2 diabetes mellitus (HCC) 03/12/2016   NSTEMI (non-ST elevated myocardial infarction) (HCC) 03/10/2016   Prostate cancer screening 02/10/2016   H/O: CVA (cerebrovascular accident) 03/01/2015   Right knee pain 04/06/2014   BPH (benign prostatic hyperplasia) 11/16/2013   Routine general medical examination at a health care facility 11/12/2013   B12 deficiency 09/29/2007   ERECTILE DYSFUNCTION 07/01/2007   History of tobacco abuse 07/01/2007   LOW BACK PAIN, CHRONIC 07/01/2007   Primary hypertension 06/03/2007   Past Medical History:  Diagnosis Date   Arthritis    CAD (coronary artery disease)    a. NSTEMI: 100% stenosis of the distal RCA --> DES placed 5/17 CLEARED BY CARDIOLOGIST   Chronic low back pain    a. s/p back surgery.   CVA (cerebral infarction)    a. 02/2015 dyarthria 2/2 CVA involving the lateral aspect of the precentral gyrus.   DM2 (diabetes mellitus, type 2) (HCC)    a. pre-diabetic in the past. b. A1c 03/2016 elevated to 7.9.   ETOH abuse    GERD (gastroesophageal reflux disease)    History of echocardiogram    a. Mild LVH, EF 55-60%, Definity  contrast used-LV wall motion could not be adequately assessed   History of non-ST elevation myocardial infarction (NSTEMI) 03/2016   a. PCI: 3.5 x 24 mm Promus Premier DES to distal RCA   History of tobacco abuse    Hypertension    a. 02/2015 echo: 55-65%, trace TR/MR. b. 03/2016: echo with EF of 55-60%.    Myocardial infarction [facility])    Obesity    Ruptured disk    Stroke [facility])    2016   Past Surgical History:  Procedure Laterality Date   BACK SURGERY  2000   ruptured disk, L-S   CARDIAC CATHETERIZATION  05/2004   minimal CAD   CARDIAC CATHETERIZATION N/A 03/10/2016   Procedure: Left Heart Cath and Coronary Angiography;   Surgeon: Lonni JONETTA Cash, MD;  [facility] INVASIVE CV LAB;  Service: Cardiovascular;  Laterality: N/A;   CARDIAC CATHETERIZATION N/A 03/10/2016   Procedure: Coronary Stent Intervention;  Surgeon: Lonni JONETTA Cash, MD;  Location[facility] INVASIVE CV LAB;  Service: Cardiovascular;  Laterality: N/A;   CATARACT EXTRACTION W/PHACO Left 07/12/2016   Procedure: CATARACT EXTRACTION PHACO AND INTRAOCULAR LENS PLACEMENT (IOC);  Surgeon: Adine Oneil Novak, MD;  Location: [facility] ORS;  Service: Ophthalmology;  Laterality: Left;  US   00:50 AP% 9.2 CDE 4.63 fluid pack lot # [id number]   CATARACT EXTRACTION W/PHACO Right 10/23/2021   Procedure: CATARACT EXTRACTION PHACO AND INTRAOCULAR LENS PLACEMENT (IOC) RIGHT DIABETIC;  Surgeon: Novak Adine Oneil, MD;  Location: [facility];  Service: Ophthalmology;  Laterality: Right;  Diabetic 1.15 00:16.4   CORONARY ANGIOPLASTY     STENT 5/17   TOTAL HIP ARTHROPLASTY Left 06/03/2019   Procedure: TOTAL HIP ARTHROPLASTY;  Surgeon: Mardee Lynwood SQUIBB, MD;  Location: [facility] ORS;  Service: Orthopedics;  Laterality: Left;   TOTAL HIP ARTHROPLASTY Right 03/30/2020   Procedure: TOTAL HIP ARTHROPLASTY;  Surgeon: Mardee Lynwood SQUIBB, MD;  Location: [facility] ORS;  Service: Orthopedics;  Laterality: Right;  posterior    Social History   Tobacco Use   Smoking status: Former    Current packs/day: 0.00    Types: Cigarettes    Quit date: 01/18/2015    Years since quitting: 8.8   Smokeless tobacco: Never  Vaping Use   Vaping status: Never Used  Substance Use Topics   Alcohol use: Yes    Alcohol/week: 12.0 standard drinks of alcohol    Types: 12 Cans of beer per week    Comment: weekends   Drug use: No   Family History  Problem Relation Age of Onset   Dementia Mother    Hypertension Father    Heart disease Father        MI   Cancer Maternal Grandmother        lung   Heart failure Paternal Grandfather    Allergies  Allergen Reactions   Ace Inhibitors Cough   Current  Outpatient Medications on File Prior to Visit  Medication Sig Dispense Refill   amLODipine  (NORVASC ) 5 MG tablet Take 1 tablet (5 mg total) by mouth daily. 90 tablet 3   aspirin  EC 81 MG tablet Take 81 mg by mouth daily. Swallow whole.     celecoxib  (CELEBREX ) 200 MG capsule Take 200 mg by mouth daily.     cyanocobalamin  (VITAMIN B12) 1000 MCG tablet Take 1,000 mcg by mouth daily.     glipiZIDE  (GLUCOTROL  XL) 2.5 MG 24 hr tablet TAKE 1 TABLET BY MOUTH EVERY DAY WITH BREAKFAST 90 tablet 1   hydrochlorothiazide  (HYDRODIURIL ) 25 MG tablet Take 0.5 tablets (12.5 mg total) by mouth daily. 45 tablet 3   metFORMIN  (GLUCOPHAGE ) 1000 MG tablet Take 1 tablet (1,000 mg total) by mouth 2 (two) times daily with a meal. 180 tablet 3   nitroGLYCERIN  (NITROSTAT ) 0.4 MG SL tablet Place 1 tablet (0.4 mg total) under the tongue every 5 (five) minutes as needed for chest pain (CP or SOB). 25 tablet 3   Probiotic Product (PROBIOTIC PO) Take 1 capsule by mouth daily.     sildenafil  (VIAGRA ) 100 MG tablet TAKE 0.5-1 TABLETS BY MOUTH DAILY AS NEEDED FOR ERECTILE DYSFUNCTION. 5 tablet 5   valsartan  (DIOVAN ) 80 MG tablet Take 1 tablet (80 mg total) by mouth daily. 90 tablet 3   No current facility-administered medications on file prior to visit.    Review of Systems  Constitutional:  Negative for activity change, appetite change, fatigue, fever and unexpected weight change.  HENT:  Negative for congestion, rhinorrhea, sore throat and trouble swallowing.   Eyes:  Negative for pain, redness, itching and visual disturbance.  Respiratory:  Positive for cough. Negative for apnea, choking, chest tightness, shortness of breath, wheezing and stridor.   Cardiovascular:  Negative for chest pain and palpitations.  Gastrointestinal:  Negative for abdominal pain, blood in stool, constipation, diarrhea and nausea.  Endocrine: Negative for cold intolerance, heat intolerance, polydipsia and polyuria.  Genitourinary:  Negative for  difficulty urinating, dysuria, frequency and urgency.  Musculoskeletal:  Negative for arthralgias, joint swelling and myalgias.  Skin:  Negative for pallor and rash.  Neurological:  Negative for dizziness, tremors, weakness, numbness and headaches.  Hematological:  Negative for adenopathy. Does not bruise/bleed easily.  Psychiatric/Behavioral:  Negative for decreased concentration and dysphoric mood. The patient is not nervous/anxious.        Objective:   Physical Exam Constitutional:      General: He is not in acute distress.    Appearance: Normal appearance. He is well-developed. He is obese. He is not ill-appearing or diaphoretic.  HENT:     Head: Normocephalic and atraumatic.  Eyes:     Conjunctiva/sclera: Conjunctivae normal.     Pupils: Pupils are equal, round, and reactive to light.  Neck:     Thyroid : No thyromegaly.     Vascular: No carotid bruit or JVD.  Cardiovascular:     Rate and Rhythm: Normal rate and regular rhythm.     Heart sounds: Normal heart sounds.     No gallop.  Pulmonary:     Effort: Pulmonary effort is normal. No respiratory distress.     Breath sounds: No stridor. Rhonchi present. No wheezing or rales.     Comments: Few scattered rhonchi  Some upper airway sounds cleared by cough Abdominal:     General: Abdomen is protuberant. Bowel sounds are normal. There is no distension or abdominal bruit.     Palpations: Abdomen is soft. There is no shifting dullness, fluid wave, hepatomegaly, splenomegaly, mass or pulsatile mass.     Tenderness: There is no abdominal tenderness.  Musculoskeletal:     Cervical back: Normal range of motion and neck supple.     Right lower leg: No edema.     Left lower leg: No edema.  Lymphadenopathy:     Cervical: No cervical adenopathy.  Skin:    General: Skin is warm and dry.     Coloration: Skin is not pale.     Findings: No rash.  Neurological:     Mental Status: He is alert.     Coordination: Coordination normal.      Deep Tendon Reflexes: Reflexes are normal and symmetric. Reflexes normal.  Psychiatric:        Mood and Affect: Mood normal.           Assessment & Plan:   Problem List Items Addressed This Visit       Respiratory   Acute bronchitis   For 10 days- prod cough w/o wheezing in former smoker with dm  Reassuring exam  Will cover with doxycycline  in light of length of symptoms and risk status Update if not starting to improve in a week or if worsening  Symptoms-can use mucinex DM prn  Fluids/ rest  Instructed to call if wheezing or fever  Update if not starting to improve in a week or if worsening  Call back and Er precautions noted in detail today          Digestive   Hepatic steatosis   Reviewed last liver labs and us   Working on weight loss in past week and getting started hopefully on GLP-1 as well  No longer taking tylenol  Still on celebrex   Drinks 12 pk of light beer on weekends- states he can stop this w/o a problem Handout given  Will watch lft and plt closely  No abd symptoms  Will refer to GI if needed  Hoping this will improve with weight loss and etoh cessation        Relevant Orders   Hepatic function  panel     Endocrine   Type 2 diabetes mellitus with hyperglycemia, without long-term current use of insulin  (HCC) - Primary   Lab Results  Component Value Date   HGBA1C 9.1 (H) 10/23/2023   Has stopped holiday eating Continues metformin  1000 mg bid Glipizide  xl 2.5 mg daily  Adding mounjaro  2.5 mg weekly (met with pharmacist yesterday to work on discount card /etc)  Disc option of GLP medication including possible side effects like GI intolerance and risk of thyroid  and endocrine cancer, pancreatitis and gallstones, kidney problems and diabetic retinopathy Handout given  Will update with 3rd shot and increase dose if tol Follow up 3 mo or earlier  Reviewed low glycemic diet /doing better this week       Relevant Medications   tirzepatide  (MOUNJARO )  2.5 MG/0.5ML Pen     Hematopoietic and Hemostatic   Thrombocytopenia (HCC)   Last plt ct 87 No bruising or bleeding  Has hepatic steatosis Stopping etoh   Will watch closely        Other   Morbid obesity (HCC)   Discussed how this problem influences overall health and the risks it imposes  Reviewed plan for weight loss with lower calorie diet (via better food choices (lower glycemic and portion control) along with exercise building up to or more than 30 minutes 5 days per week including some aerobic activity and strength training   Back on track with diet this week  Co morbidities include DM2 and hepatic steatosis   Starting glp-1 (mounjaro )hopefully this week   Follow up 3 mo or earlier if needed       Relevant Medications   tirzepatide  (MOUNJARO ) 2.5 MG/0.5ML Pen   Elevated transaminase level   From steatosis  No longer taking tylenol  Will stop etoh  Working on weight loss and hopes to start glp-1 Lab today  Follow closely       Relevant Orders   Hepatic function panel

## 2023-11-08 NOTE — Assessment & Plan Note (Signed)
 For 10 days- prod cough w/o wheezing in former smoker with dm  Reassuring exam  Will cover with doxycycline  in light of length of symptoms and risk status Update if not starting to improve in a week or if worsening  Symptoms-can use mucinex DM prn  Fluids/ rest  Instructed to call if wheezing or fever  Update if not starting to improve in a week or if worsening  Call back and Er precautions noted in detail today

## 2023-11-08 NOTE — Assessment & Plan Note (Signed)
 From steatosis  No longer taking tylenol Will stop etoh  Working on weight loss and hopes to start glp-1 Lab today  Follow closely

## 2023-11-08 NOTE — Assessment & Plan Note (Signed)
 Discussed how this problem influences overall health and the risks it imposes  Reviewed plan for weight loss with lower calorie diet (via better food choices (lower glycemic and portion control) along with exercise building up to or more than 30 minutes 5 days per week including some aerobic activity and strength training   Back on track with diet this week  Co morbidities include DM2 and hepatic steatosis   Starting glp-1 (mounjaro )hopefully this week   Follow up 3 mo or earlier if needed

## 2023-11-10 NOTE — Addendum Note (Signed)
 Addended by: Roxy Manns A on: 11/10/2023 04:54 PM   Modules accepted: Orders

## 2023-11-11 ENCOUNTER — Telehealth: Payer: Self-pay | Admitting: *Deleted

## 2023-11-11 NOTE — Telephone Encounter (Signed)
 Pt has already viewed labs per mychart.   Per Dr. Milinda Antis:  re check LFTs with cbc and INR please in about a month   Please schedule non fasting lab appt in 1 month

## 2023-11-11 NOTE — Telephone Encounter (Signed)
 LVM for patient to c/b and schedule.

## 2023-11-15 ENCOUNTER — Encounter: Payer: Self-pay | Admitting: Podiatry

## 2023-11-15 ENCOUNTER — Ambulatory Visit: Payer: BC Managed Care – PPO | Admitting: Podiatry

## 2023-11-15 DIAGNOSIS — E119 Type 2 diabetes mellitus without complications: Secondary | ICD-10-CM | POA: Diagnosis not present

## 2023-11-15 NOTE — Progress Notes (Signed)
 Chief Complaint  Patient presents with   Diabetes    I reckon you call them bunions on the side of my big toes. N - callus L - hallux bilateral D - 1 year O - gradually worse, rt > lt C - sore, Diabetic A - walking and being on my feet all day T - none     HPI: 58 y.o. male presenting today as a new patient referral from his PCP for evaluation of diabetic foot evaluation.  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  No complaints other than calluses that developed to the bilateral great toes.  Past Medical History:  Diagnosis Date   Arthritis    CAD (coronary artery disease)    a. NSTEMI: 100% stenosis of the distal RCA --> DES placed 5/17 CLEARED BY CARDIOLOGIST   Chronic low back pain    a. s/p back surgery.   CVA (cerebral infarction)    a. 02/2015 dyarthria 2/2 CVA involving the lateral aspect of the precentral gyrus.   DM2 (diabetes mellitus, type 2) (HCC)    a. pre-diabetic in the past. b. A1c 03/2016 elevated to 7.9.   ETOH abuse    GERD (gastroesophageal reflux disease)    History of echocardiogram    a. Mild LVH, EF 55-60%, Definity  contrast used-LV wall motion could not be adequately assessed   History of non-ST elevation myocardial infarction (NSTEMI) 03/2016   a. PCI: 3.5 x 24 mm Promus Premier DES to distal RCA   History of tobacco abuse    Hypertension    a. 02/2015 echo: 55-65%, trace TR/MR. b. 03/2016: echo with EF of 55-60%.    Myocardial infarction [facility])    Obesity    Ruptured disk    Stroke [facility])    2016    Past Surgical History:  Procedure Laterality Date   BACK SURGERY  2000   ruptured disk, L-S   CARDIAC CATHETERIZATION  05/2004   minimal CAD   CARDIAC CATHETERIZATION N/A 03/10/2016   Procedure: Left Heart Cath and Coronary Angiography;  Surgeon: Lonni JONETTA Cash, MD;  Location: [facility] INVASIVE CV LAB;  Service: Cardiovascular;  Laterality: N/A;   CARDIAC CATHETERIZATION N/A 03/10/2016   Procedure: Coronary Stent Intervention;   Surgeon: Lonni JONETTA Cash, MD;  Location: [facility] INVASIVE CV LAB;  Service: Cardiovascular;  Laterality: N/A;   CATARACT EXTRACTION W/PHACO Left 07/12/2016   Procedure: CATARACT EXTRACTION PHACO AND INTRAOCULAR LENS PLACEMENT (IOC);  Surgeon: Adine Oneil Novak, MD;  Location: [facility] ORS;  Service: Ophthalmology;  Laterality: Left;  US   00:50 AP% 9.2 CDE 4.63 fluid pack lot # [id number]   CATARACT EXTRACTION W/PHACO Right 10/23/2021   Procedure: CATARACT EXTRACTION PHACO AND INTRAOCULAR LENS PLACEMENT (IOC) RIGHT DIABETIC;  Surgeon: Novak Adine Oneil, MD;  Location: [facility]ERY [facility];  Service: Ophthalmology;  Laterality: Right;  Diabetic 1.15 00:16.4   CORONARY ANGIOPLASTY     STENT 5/17   TOTAL HIP ARTHROPLASTY Left 06/03/2019   Procedure: TOTAL HIP ARTHROPLASTY;  Surgeon: Mardee Lynwood SQUIBB, MD;  Location: [facility] ORS;  Service: Orthopedics;  Laterality: Left;   TOTAL HIP ARTHROPLASTY Right 03/30/2020   Procedure: TOTAL HIP ARTHROPLASTY;  Surgeon: Mardee Lynwood SQUIBB, MD;  Location: [facility] ORS;  Service: Orthopedics;  Laterality: Right;  posterior     Allergies  Allergen Reactions   Ace Inhibitors Cough     Physical Exam: General: The patient is alert and oriented x3 in no acute distress.  Dermatology: Skin is warm, dry and supple  bilateral lower extremities.  Hyperkeratotic calluses noted to the plantar medial aspect of the bilateral great toes at the level of the IPJ.  No open wounds  Vascular: Palpable pedal pulses bilaterally. Capillary refill within normal limits.  No appreciable edema.  No erythema.  Neurological: Grossly intact via light touch  Musculoskeletal Exam: Pes planovalgus deformity noted with weightbearing  Assessment/Plan of Care: 1.  Encounter for diabetic foot exam 2.  Preulcerative callus lesions bilateral great toes  -Patient evaluated.  Comprehensive diabetic foot exam performed today -Excisional debridement of the callus lesions was performed today to the bilateral  great toes without incident or bleeding -Continue to refrain from going barefoot.  Recommend good supportive shoes and sneakers -Continue close management with PCP for diabetes control -Return to clinic as needed  *Builds bridges.  On his feet 12-14 hours/day     Thresa EMERSON Sar, DPM [facility]  Dr. Thresa EMERSON Sar, DPM    [address] [place], [place] [address]                Office [phone]  Fax [phone]

## 2023-11-26 ENCOUNTER — Encounter: Payer: Self-pay | Admitting: Family Medicine

## 2023-11-27 MED ORDER — TIRZEPATIDE 5 MG/0.5ML ~~LOC~~ SOAJ: 5.0000 mg | SUBCUTANEOUS | 0 refills | Status: AC

## 2023-12-03 NOTE — Addendum Note (Signed)
Addended by: Roxy Manns A on: 12/03/2023 08:54 PM   Modules accepted: Orders

## 2023-12-04 ENCOUNTER — Other Ambulatory Visit: Payer: Self-pay | Admitting: Family Medicine

## 2023-12-04 NOTE — Telephone Encounter (Signed)
I had already sent to pharmacy  Will route to Lillia Abed to see what ideas she has re: savings card  Thanks for letting me know

## 2023-12-04 NOTE — Telephone Encounter (Unsigned)
Copied from CRM [id number]. Topic: Clinical - Medication Question >> Dec 04, 2023 11:30 AM Elizebeth Brooking wrote: Reason for CRM: Patient called in stating when he went to get his Greggory Keen they charged him the full price, last month Lillia Abed gave him a savings card for medication and stated that he was good for the rest of the year, he is unable to get on his mychart at this time and was wondering [facility] [address] [facility] [place] [address]

## 2023-12-04 NOTE — Telephone Encounter (Signed)
Copied from CRM [id number]. Topic: Clinical - Medication Refill >> Dec 04, 2023 10:34 AM Hector Shade B wrote: Most Recent Primary Care Visit:  Provider: Roxy Manns A  Department: [facility]  Visit Type: OFFICE VISIT  Date: 11/08/2023  Medication:  tirzepatide [facility]) 5 MG/0.5ML Pen    Has the patient contacted their pharmacy? Yes (Agent: If no, request that the patient contact the pharmacy for the refill. If patient does not wish to contact the pharmacy document the reason why and proceed with request.) (Agent: If yes, when and what did the pharmacy advise?)Stated it was too early to refill  Is this the correct pharmacy for this prescription? Yes If no, delete pharmacy and type the correct one.  This is the patient's preferred pharmacy:  [phone] [place] - [address] [place] [address] Phone: [phone] Fax: [phone]   Has the prescription been filled recently? Yes  Is the patient out of the medication? Yes next dosage due on Saturday  Has the patient been seen for an appointment in the last year OR does the patient have an upcoming appointment? Yes  Can we respond through MyChart? Yes  Agent: Please be advised that Rx refills may take up to 3 business days. We ask that you follow-up with your pharmacy.

## 2023-12-05 NOTE — Progress Notes (Signed)
Cardiology Office Note:  .   Date:  12/06/2023  ID:  Daniel [name], DOB [birth year], MRN [id number] PCP: Judy Pimple, MD  [facility] Providers Cardiologist:  Verne Carrow, MD {  History of Present Illness: .   Daniel [name] is a 58 y.o. male with a past medical history of CAD status post NSTEMI 03/2016 with DES to RCA, echocardiogram with normal LVEF, hypertension, DM, prior CVA here for follow-up appointment.  History includes chest pain back in 2018 with negative workup in the ED and patient canceled stress test.  Saw Dr. Clifton James 03/2020 was doing well at that time.  Was last seen 07/12/2021 and did not complain of any chest pain, dyspnea, dizziness, or presyncope.  Patient builds bridges for living.  Walks on the treadmill 3 miles 2-3 times a week.  Has lost around 12 pounds doing a low carbohydrate diet.  Today, he presents with a history of coronary artery disease and diabetes,for a routine follow-up. He reports that he has not yet taken his blood pressure medication for the day due to his appointment. He plans to take his medication after the appointment and after eating. He reports that his blood pressure is usually much lower when he takes his medication. He has been walking less frequently due to hip pain, but he is trying to increase his physical activity to help manage his blood sugar levels. He has been taking Mounjaro for a month, and his blood sugar levels have decreased slightly. He hopes to continue to see improvements in his blood sugar levels and to lose weight.  The patient also reports that he has previously tried Lipitor for cholesterol management, but he experienced significant side effects and stopped taking the medication. He is interested in trying a non-statin option for cholesterol management, such as a twice-yearly injection. He hopes to reduce the number of daily medications he takes by improving his blood sugar levels and losing weight.  Reports no  shortness of breath nor dyspnea on exertion. Reports no chest pain, pressure, or tightness. No edema, orthopnea, PND. Reports no palpitations.   Discussed the use of AI scribe software for clinical note transcription with the patient, who gave verbal consent to proceed.   ROS: Pertinent ROS in HPI  Studies Reviewed: .        Cardiac cath May 2017: Conclusion    Prox RCA lesion, 30% stenosed. Dist RCA lesion, 100% stenosed. Post intervention, there is a 0% residual stenosis.   1. NSTEMI secondary to occluded distal RCA 2. Successful PTCA/DES of the distal RCA 3. No obstructive disease in the LAD or Circumflex 4. Mild LV systolic dysfunction.     Echo May 2017: Procedure narrative: Transthoracic echocardiography. Image   quality was adequate. The study was technically difficult, as a   result of poor sound wave transmission. Intravenous contrast   (Definity) was administered. - Left ventricle: The cavity size was mildly dilated. Wall   thickness was increased in a pattern of mild LVH. Systolic   function was normal. The estimated ejection fraction was in the   range of 55% to 60%. Images were inadequate for LV wall motion   assessment. The study is not technically sufficient to allow   evaluation of LV diastolic function. - Left atrium: The atrium was normal in size. - Inferior vena cava: The vessel was normal in size. The   respirophasic diameter changes were in the normal range (>= 50%),   consistent with normal central venous  pressure.        Physical Exam:   VS:  BP (!) 160/70 (BP Location: Right Arm, Patient Position: Sitting, Cuff Size: Normal)   Pulse 69   Ht 5\' 10"  (1.778 m)   Wt 262 lb 3.2 oz (118.9 kg)   SpO2 93%   BMI 37.62 kg/m    Wt Readings from Last 3 Encounters:  12/06/23 262 lb 3.2 oz (118.9 kg)  11/08/23 267 lb 6 oz (121.3 kg)  10/23/23 263 lb 2 oz (119.4 kg)    GEN: Well nourished, well developed in no acute distress NECK: No JVD; No carotid  bruits CARDIAC: RRR, no murmurs, rubs, gallops RESPIRATORY:  Clear to auscultation without rales, wheezing or rhonchi  ABDOMEN: Soft, non-tender, non-distended EXTREMITIES:  No edema; No deformity   ASSESSMENT AND PLAN: .    Hypertension Elevated blood pressure at today's visit, likely due to delayed medication administration. Patient reports lower readings when medication is taken regularly. -Continue current antihypertensive regimen. -Encouraged to take medications as directed.  Type 2 Diabetes Mellitus Recent increase in blood glucose levels, currently managed with Gojarta 5mg . Patient is making efforts to increase physical activity and lose weight to improve glycemic control. -Continue Gojarta 5mg . -Encouraged to continue lifestyle modifications including diet and exercise.  Hyperlipidemia Elevated LDL (122) in the setting of known coronary artery disease. Patient has history of intolerance to statin therapy (Lipitor). -Initiate non-statin therapy with Inclisiran (Leqvio) injection every 6 months, pending insurance approval. -Check lipid panel 8 weeks after initiation of Inclisiran.  Bilateral Hip Replacements Patient reports decreased physical activity due to recent bilateral hip replacements. Currently in recovery phase with physical therapy. -Encouraged to continue physical therapy and gradually increase activity as tolerated.  General Health Maintenance / Followup Plans -Continue current medications and lifestyle modifications. -Follow up after initiation of Inclisiran to assess response and tolerability. -Keep healthcare team updated regarding blood pressure readings and any new symptoms.      Dispo: He can follow-up in a year with Dr. Clifton James   Signed, Sharlene Dory, PA-C

## 2023-12-06 ENCOUNTER — Encounter: Payer: Self-pay | Admitting: Physician Assistant

## 2023-12-06 ENCOUNTER — Encounter: Payer: Self-pay | Admitting: *Deleted

## 2023-12-06 ENCOUNTER — Ambulatory Visit: Payer: BC Managed Care – PPO | Attending: Physician Assistant | Admitting: Physician Assistant

## 2023-12-06 VITALS — BP 160/70 | HR 69 | Ht 70.0 in | Wt 262.2 lb

## 2023-12-06 DIAGNOSIS — I2510 Atherosclerotic heart disease of native coronary artery without angina pectoris: Secondary | ICD-10-CM | POA: Diagnosis not present

## 2023-12-06 DIAGNOSIS — E785 Hyperlipidemia, unspecified: Secondary | ICD-10-CM | POA: Diagnosis not present

## 2023-12-06 DIAGNOSIS — I10 Essential (primary) hypertension: Secondary | ICD-10-CM | POA: Diagnosis not present

## 2023-12-06 DIAGNOSIS — I639 Cerebral infarction, unspecified: Secondary | ICD-10-CM

## 2023-12-06 DIAGNOSIS — E1165 Type 2 diabetes mellitus with hyperglycemia: Secondary | ICD-10-CM

## 2023-12-06 NOTE — Patient Instructions (Signed)
Medication Instructions:   The office will be in touch with he cost of the new medication Leqvio.   *If you need a refill on your cardiac medications before your next appointment, please call your pharmacy*   Lab Work:  None ordered.  If you have labs (blood work) drawn today and your tests are completely normal, you will receive your results only by: MyChart Message (if you have MyChart) OR A paper copy in the mail If you have any lab test that is abnormal or we need to change your treatment, we will call you to review the results.   Testing/Procedures:  None ordered.   Follow-Up: At [facility], you and your health needs are our priority.  As part of our continuing mission to provide you with exceptional heart care, we have created designated Provider Care Teams.  These Care Teams include your primary Cardiologist (physician) and Advanced Practice Providers (APPs -  Physician Assistants and Nurse Practitioners) who all work together to provide you with the care you need, when you need it.  We recommend signing up for the patient portal called "MyChart".  Sign up information is provided on this After Visit Summary.  MyChart is used to connect with patients for Virtual Visits (Telemedicine).  Patients are able to view lab/test results, encounter notes, upcoming appointments, etc.  Non-urgent messages can be sent to your provider as well.   To learn more about what you can do with MyChart, go to [email or web address].    Your next appointment:   1 year(s)  Provider:   Verne Carrow, MD     Other Instructions  Your physician wants you to follow-up in: 1 year.  You will receive a reminder letter in the mail two months in advance. If you don't receive a letter, please call our office to schedule the follow-up appointment.        1st Floor: - Lobby - Registration  - Pharmacy  - Lab - Cafe  2nd Floor: - PV Lab - Diagnostic Testing (echo, CT, nuclear  med)  3rd Floor: - Vacant  4th Floor: - TCTS (cardiothoracic surgery) - AFib Clinic - Structural Heart Clinic - Vascular Surgery  - Vascular Ultrasound  5th Floor: - HeartCare Cardiology (general and EP) - Clinical Pharmacy for coumadin, hypertension, lipid, weight-loss medications, and med management appointments    Valet parking services will be available as well.

## 2023-12-31 ENCOUNTER — Telehealth: Payer: Self-pay | Admitting: Family Medicine

## 2023-12-31 MED ORDER — TIRZEPATIDE 5 MG/0.5ML ~~LOC~~ SOAJ: 5.0000 mg | SUBCUTANEOUS | 0 refills | Status: DC

## 2023-12-31 NOTE — Telephone Encounter (Signed)
 Last Fill: 11/27/23 17mL/0 RF  Last OV: 11/08/23 Next OV: 02/07/24  Routing to provider for review/authorization.   Copied from CRM [id number]. Topic: Clinical - Medication Refill >> Dec 31, 2023  2:15 PM [place] A wrote: Most Recent Primary Care Visit:  Provider: Roxy Manns A  Department: [facility]  Visit Type: OFFICE VISIT  Date: 11/08/2023  Medication: Greggory Keen  Has the patient contacted their pharmacy? No (Agent: If no, request that the patient contact the pharmacy for the refill. If patient does not wish to contact the pharmacy document the reason why and proceed with request.) (Agent: If yes, when and what did the pharmacy advise?)  Is this the correct pharmacy for this prescription? Yes If no, delete pharmacy and type the correct one.  This is the patient's preferred pharmacy:  [phone],  - [address] [place] [address] Phone: [phone] Fax: [phone]   Has the prescription been filled recently? No  Is the patient out of the medication? Yes  Has the patient been seen for an appointment in the last year OR does the patient have an upcoming appointment? Yes  Can we respond through MyChart? Yes  Agent: Please be advised that Rx refills may take up to 3 business days. We ask that you follow-up with your pharmacy.

## 2023-12-31 NOTE — Addendum Note (Signed)
 Addended by: Roxy Manns A on: 12/31/2023 07:59 PM   Modules accepted: Orders

## 2023-12-31 NOTE — Telephone Encounter (Signed)
 See my chart message

## 2023-12-31 NOTE — Telephone Encounter (Signed)
 I sent it again  Please let me know if there are any issues

## 2024-01-14 ENCOUNTER — Telehealth: Payer: Self-pay

## 2024-01-14 LAB — HM DIABETES EYE EXAM

## 2024-01-14 NOTE — Telephone Encounter (Signed)
 Spoke with [place] about patient and appt needed and she okayed patient to come in on 01/16/24 at 12 pm. Called patient and scheduled appt for 01/16/24 at 12 pm.

## 2024-01-14 NOTE — Telephone Encounter (Signed)
 Copied from CRM [id number]. Topic: Appointments - Appointment Scheduling >> Jan 14, 2024  2:22 PM Isabelle Course C wrote: Patient/patient representative is calling to schedule an appointment. Patient was having right eye issues... 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 Dr. stated that there is nothing that he can do and it's permanent. Patient needs work clearance to return to work    Received phone call from [place]. Advised PCP does not have opening. Preferred that follow up be with her but if it is urgent can be set up with another provider. Requested that they let patient know that we can't guarantee that letter will be provided from another provider. Per representative patient has reached out to eye doctor and they recommend note come from PCP office.

## 2024-01-14 NOTE — Telephone Encounter (Signed)
 Will see him then  Please send for oph notes if not in epic  Thanks

## 2024-01-14 NOTE — Telephone Encounter (Signed)
 Records request sent to [facility]

## 2024-01-15 ENCOUNTER — Encounter: Payer: Self-pay | Admitting: Family Medicine

## 2024-01-15 DIAGNOSIS — H34239 Retinal artery branch occlusion, unspecified eye: Secondary | ICD-10-CM | POA: Insufficient documentation

## 2024-01-16 ENCOUNTER — Ambulatory Visit: Admitting: Family Medicine

## 2024-01-16 ENCOUNTER — Encounter: Payer: Self-pay | Admitting: Family Medicine

## 2024-01-16 VITALS — BP 128/68 | HR 66 | Temp 98.1°F | Ht 70.0 in | Wt 256.2 lb

## 2024-01-16 DIAGNOSIS — Z7985 Long-term (current) use of injectable non-insulin antidiabetic drugs: Secondary | ICD-10-CM

## 2024-01-16 DIAGNOSIS — Z7984 Long term (current) use of oral hypoglycemic drugs: Secondary | ICD-10-CM

## 2024-01-16 DIAGNOSIS — E785 Hyperlipidemia, unspecified: Secondary | ICD-10-CM

## 2024-01-16 DIAGNOSIS — E1165 Type 2 diabetes mellitus with hyperglycemia: Secondary | ICD-10-CM | POA: Diagnosis not present

## 2024-01-16 DIAGNOSIS — E1169 Type 2 diabetes mellitus with other specified complication: Secondary | ICD-10-CM

## 2024-01-16 DIAGNOSIS — H34231 Retinal artery branch occlusion, right eye: Secondary | ICD-10-CM

## 2024-01-16 DIAGNOSIS — I10 Essential (primary) hypertension: Secondary | ICD-10-CM

## 2024-01-16 LAB — SEDIMENTATION RATE: Sed Rate: 38 mm/h — ABNORMAL HIGH (ref 0–20)

## 2024-01-16 LAB — C-REACTIVE PROTEIN: CRP: <1 mg/dL (ref 0.5–20.0)

## 2024-01-16 NOTE — Assessment & Plan Note (Signed)
 bp in fair control at this time  BP Readings from Last 1 Encounters:  01/16/24 128/68   No changes needed Most recent labs reviewed  Disc lifstyle change with low sodium diet and exercise  Continues Amlodipine 5 mg daily Hydrochlorothiazide 25 mg daily  Valsartan 80 mg daily   Normal exam /pulse and regular rhythm

## 2024-01-16 NOTE — Assessment & Plan Note (Signed)
 Pt is intol of statin  Disc goals for lipids and reasons to control them Rev last labs with pt Rev low sat fat diet in detail LDL of 122  Has had retinal artery occlusion   Per cardiology note- was supposed to investigate option fo leqvio injection and get back to pt about coverage/ per pt has not heard

## 2024-01-16 NOTE — Progress Notes (Signed)
 Subjective:    Patient ID: Daniel [name], male    DOB: 06/18/1966, 58 y.o.   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   Symptoms started on 01/12/24- happened sitting on golf cart  Right eye - has band of black in middle of visual field  Thinks it is getting better .notices difference  Presented to [facility] on 3/11    History of CAD Last cardiology visit was jan  History of CVA in 2016 -small vessel dz with lacunar infarcts  At that time carotid doppler showed minimal calcified plaques at both carotid bulbs and prox ica but no sig stenosis  Takes asa daily   HTN bp is stable today  No cp or palpitations or headaches or edema  No side effects to medicines  BP Readings from Last 3 Encounters:  01/16/24 128/68  12/06/23 (!) 160/70  11/08/23 134/72    Amlodipine 5 mg daily  Hydrochlorothiazide 25 mg daily  Valsartan 80 mg daily   Pulse Readings from Last 3 Encounters:  01/16/24 66  12/06/23 69  11/08/23 78     Celebrex on med list -unsure he can tolerate being without it    Lab Results  Component Value Date   NA 134 (L) 10/23/2023   K 4.3 10/23/2023   CO2 25 10/23/2023   GLUCOSE 194 (H) 10/23/2023   BUN 17 10/23/2023   CREATININE 0.81 10/23/2023   CALCIUM 9.3 10/23/2023   GFR 96.42 10/23/2023   GFRNONAA >60 09/26/2023    Lipids  Lab Results  Component Value Date   CHOL 176 10/23/2023   HDL 35.90 (L) 10/23/2023   LDLCALC 122 (H) 10/23/2023   TRIG 88.0 10/23/2023   CHOLHDL 5 10/23/2023  Cardiology was going to look into leqvio injection Q 6 mo dep on ins approval   Lab Results  Component Value Date   ALT 118 (H) 11/08/2023   AST 83 (H) 11/08/2023    ALKPHOS 167 (H) 11/08/2023   BILITOT 0.6 11/08/2023   Lab Results  Component Value Date   WBC 4.6 10/23/2023   HGB 15.4 10/23/2023   HCT 45.6 10/23/2023   MCV 95.6 10/23/2023   PLT 87.0 (L) 10/23/2023   Lab Results  Component Value Date   TSH 2.45 10/23/2023    Lab Results  Component Value Date   ESRSEDRATE 10 03/24/2020    DM2 Lab Results  Component Value Date   HGBA1C 9.1 (H) 10/23/2023   HGBA1C 6.8 (A) 10/05/2022   HGBA1C 7.1 (H) 07/02/2022   Metformin 1000 mg bid  Mounjaro 5 mg weekly  Glipizide 2.5 mg daily   Checks glucose am pm  Am - 115-173 -usually lower  In pm - usually in the 90s     Lab Results  Component Value Date   MICROALBUR 7.3 (H) 10/23/2023   MICROALBUR 3.4 (H) 07/02/2022   Last corrected ratio was 92 in December     Patient Active Problem List   Diagnosis Date Noted   Branch retinal artery occlusion 01/15/2024   Hepatic steatosis 11/08/2023   Callus of foot 10/23/2023  Angina pectoris (HCC) 09/26/2023   Morbid obesity (HCC) 04/14/2021   H/O total hip arthroplasty 03/30/2020   Status post total replacement of left hip 06/03/2019   Thrombocytopenia (HCC) 12/31/2018   Left hip pain 12/15/2018   Type 2 diabetes mellitus with hyperglycemia, without long-term current use of insulin (HCC) 07/22/2017   Elevated transaminase level 04/16/2016   Coronary artery disease involving native coronary artery of native heart without angina pectoris 03/20/2016   History of non-ST elevation myocardial infarction (NSTEMI) 03/20/2016   Hyperlipidemia associated with type 2 diabetes mellitus (HCC) 03/12/2016   NSTEMI (non-ST elevated myocardial infarction) (HCC) 03/10/2016   Prostate cancer screening 02/10/2016   H/O: CVA (cerebrovascular accident) 03/01/2015   Right knee pain 04/06/2014   BPH (benign prostatic hyperplasia) 11/16/2013   Routine general medical examination at a health care facility 11/12/2013   B12 deficiency 09/29/2007   ERECTILE  DYSFUNCTION 07/01/2007   History of tobacco abuse 07/01/2007   LOW BACK PAIN, CHRONIC 07/01/2007   Primary hypertension 06/03/2007   Past Medical History:  Diagnosis Date   Arthritis    CAD (coronary artery disease)    a. NSTEMI: 100% stenosis of the distal RCA --> DES placed 5/17 CLEARED BY CARDIOLOGIST   Chronic low back pain    a. s/p back surgery.   CVA (cerebral infarction)    a. 02/2015 dyarthria 2/2 CVA involving the lateral aspect of the precentral gyrus.   DM2 (diabetes mellitus, type 2) (HCC)    a. pre-diabetic in the past. b. A1c 03/2016 elevated to 7.9.   ETOH abuse    GERD (gastroesophageal reflux disease)    History of echocardiogram    a. Mild LVH, EF 55-60%, Definity contrast used-LV wall motion could not be adequately assessed   History of non-ST elevation myocardial infarction (NSTEMI) 03/2016   a. PCI: 3.5 x 24 mm Promus Premier DES to distal RCA   History of tobacco abuse    Hypertension    a. 02/2015 echo: 55-65%, trace TR/MR. b. 03/2016: echo with EF of 55-60%.    Myocardial infarction [facility])    Obesity    Ruptured disk    Stroke [facility])    2016   Past Surgical History:  Procedure Laterality Date   BACK SURGERY  2000   ruptured disk, L-S   CARDIAC CATHETERIZATION  05/2004   minimal CAD   CARDIAC CATHETERIZATION N/A 03/10/2016   Procedure: Left Heart Cath and Coronary Angiography;  Surgeon: Kathleene Hazel, MD;  Location[facility] INVASIVE CV LAB;  Service: Cardiovascular;  Laterality: N/A;   CARDIAC CATHETERIZATION N/A 03/10/2016   Procedure: Coronary Stent Intervention;  Surgeon: Kathleene Hazel, MD;  Location: [facility] INVASIVE CV LAB;  Service: Cardiovascular;  Laterality: N/A;   CATARACT EXTRACTION W/PHACO Left 07/12/2016   Procedure: CATARACT EXTRACTION PHACO AND INTRAOCULAR LENS PLACEMENT (IOC);  Surgeon: Nevada Crane, MD;  Location: [facility] ORS;  Service: Ophthalmology;  Laterality: Left;  [place]  00:50 AP% 9.2 CDE 4.63 fluid pack lot # [id number]    CATARACT EXTRACTION W/PHACO Right 10/23/2021   Procedure: CATARACT EXTRACTION PHACO AND INTRAOCULAR LENS PLACEMENT (IOC) RIGHT DIABETIC;  Surgeon: Nevada Crane, MD;  Location: [facility];  Service: Ophthalmology;  Laterality: Right;  Diabetic 1.15 00:16.4   CORONARY ANGIOPLASTY     STENT 5/17   TOTAL HIP ARTHROPLASTY Left 06/03/2019   Procedure: TOTAL HIP ARTHROPLASTY;  Surgeon: Donato Heinz, MD;  Location: [facility] ORS;  Service: Orthopedics;  Laterality: Left;   TOTAL HIP  ARTHROPLASTY Right 03/30/2020   Procedure: TOTAL HIP ARTHROPLASTY;  Surgeon: Donato Heinz, MD;  Location: [facility] ORS;  Service: Orthopedics;  Laterality: Right;  posterior    Social History   Tobacco Use   Smoking status: Former    Current packs/day: 0.00    Types: Cigarettes    Quit date: 01/18/2015    Years since quitting: 9.0   Smokeless tobacco: Never  Vaping Use   Vaping status: Never Used  Substance Use Topics   Alcohol use: Yes    Alcohol/week: 12.0 standard drinks of alcohol    Types: 12 Cans of beer per week    Comment: weekends   Drug use: No   Family History  Problem Relation Age of Onset   Dementia Mother    Hypertension Father    Heart disease Father        MI   Cancer Maternal Grandmother        lung   Heart failure Paternal Grandfather    Allergies  Allergen Reactions   Ace Inhibitors Cough   Current Outpatient Medications on File Prior to Visit  Medication Sig Dispense Refill   amLODipine (NORVASC) 5 MG tablet Take 1 tablet (5 mg total) by mouth daily. 90 tablet 3   aspirin EC 81 MG tablet Take 81 mg by mouth daily. Swallow whole.     celecoxib (CELEBREX) 200 MG capsule Take 200 mg by mouth daily.     cyanocobalamin (VITAMIN B12) 1000 MCG tablet Take 1,000 mcg by mouth daily.     glipiZIDE (GLUCOTROL XL) 2.5 MG 24 hr tablet TAKE 1 TABLET BY MOUTH EVERY DAY WITH BREAKFAST 90 tablet 1   hydrochlorothiazide (HYDRODIURIL) 25 MG tablet Take 0.5 tablets (12.5 mg total) by  mouth daily. 45 tablet 3   metFORMIN (GLUCOPHAGE) 1000 MG tablet Take 1 tablet (1,000 mg total) by mouth 2 (two) times daily with a meal. 180 tablet 3   nitroGLYCERIN (NITROSTAT) 0.4 MG SL tablet Place 1 tablet (0.4 mg total) under the tongue every 5 (five) minutes as needed for chest pain (CP or SOB). 25 tablet 3   sildenafil (VIAGRA) 100 MG tablet TAKE 0.5-1 TABLETS BY MOUTH DAILY AS NEEDED FOR ERECTILE DYSFUNCTION. 5 tablet 5   tirzepatide (MOUNJARO) 5 MG/0.5ML Pen Inject 5 mg into the skin once a week. 2 mL 0   valsartan (DIOVAN) 80 MG tablet Take 1 tablet (80 mg total) by mouth daily. 90 tablet 3   No current facility-administered medications on file prior to visit.    Review of Systems  Constitutional:  Negative for activity change, appetite change, fatigue, fever and unexpected weight change.  HENT:  Negative for congestion, rhinorrhea, sore throat and trouble swallowing.   Eyes:  Positive for visual disturbance. Negative for pain, redness and itching.       Partial vision loss in right eye    Respiratory:  Negative for cough, chest tightness, shortness of breath and wheezing.   Cardiovascular:  Negative for chest pain and palpitations.  Gastrointestinal:  Negative for abdominal pain, blood in stool, constipation, diarrhea and nausea.  Endocrine: Negative for cold intolerance, heat intolerance, polydipsia and polyuria.  Genitourinary:  Negative for difficulty urinating, dysuria, frequency and urgency.  Musculoskeletal:  Positive for arthralgias. Negative for joint swelling and myalgias.  Skin:  Negative for pallor and rash.  Neurological:  Negative for dizziness, tremors, seizures, syncope, facial asymmetry, speech difficulty, weakness, light-headedness, numbness and headaches.  Hematological:  Negative for adenopathy. Does not bruise/bleed easily.  Psychiatric/Behavioral:  Negative for decreased concentration and dysphoric mood. The patient is not nervous/anxious.         Objective:   Physical Exam Constitutional:      General: He is not in acute distress.    Appearance: Normal appearance. He is well-developed. He is obese. He is not ill-appearing or diaphoretic.  HENT:     Head: Normocephalic and atraumatic.  Eyes:     General: No scleral icterus.    Extraocular Movements: Extraocular movements intact.     Conjunctiva/sclera: Conjunctivae normal.     Pupils: Pupils are equal, round, and reactive to light.  Neck:     Thyroid: No thyromegaly.     Vascular: No carotid bruit or JVD.  Cardiovascular:     Rate and Rhythm: Normal rate and regular rhythm.     Heart sounds: Normal heart sounds.     No gallop.  Pulmonary:     Effort: Pulmonary effort is normal. No respiratory distress.     Breath sounds: Normal breath sounds. No wheezing or rales.  Abdominal:     General: There is no distension or abdominal bruit.     Palpations: Abdomen is soft.  Musculoskeletal:     Cervical back: Normal range of motion and neck supple.     Right lower leg: No edema.     Left lower leg: No edema.  Lymphadenopathy:     Cervical: No cervical adenopathy.  Skin:    General: Skin is warm and dry.     Coloration: Skin is not pale.     Findings: No rash.  Neurological:     Mental Status: He is alert.     Cranial Nerves: No cranial nerve deficit.     Sensory: No sensory deficit.     Motor: No weakness.     Coordination: Coordination normal.     Gait: Gait normal.     Deep Tendon Reflexes: Reflexes are normal and symmetric. Reflexes normal.  Psychiatric:        Mood and Affect: Mood normal.           Assessment & Plan:   Problem List Items Addressed This Visit       Cardiovascular and Mediastinum   Primary hypertension   bp in fair control at this time  BP Readings from Last 1 Encounters:  01/16/24 128/68   No changes needed Most recent labs reviewed  Disc lifstyle change with low sodium diet and exercise  Continues Amlodipine 5 mg  daily Hydrochlorothiazide 25 mg daily  Valsartan 80 mg daily   Normal exam /pulse and regular rhythm       Branch retinal artery occlusion - Primary   This is new  Reviewed notes from  eye on 3/11, symptoms started the 9th  Partial loss of vision in right eye  Pt thinks is improving  Risk factors include previous stroke, vasc dz, DM2, HTN He does take asa  Blood pressure is controlled   Will check esr and crp today to r/o TA (doubt this)  Per pt -he had carotid doppler and echo in December (I can only see the myoview in epic however)  Will reach out to cardiologist about this   Call back and Er precautions noted in detail today    I cannot release him to return to work  - this will be based on vision , asked him to reach out to his oph regarding that   Of note pt declines ref to neuro/stroke specialist  Encouraged to hold celebrex in light of stroke risk- he is willing to try  Exam today is reassuring -pt feels ok       Relevant Orders   Sedimentation Rate   C-reactive protein     Endocrine   Type 2 diabetes mellitus with hyperglycemia, without long-term current use of insulin (HCC)   Glucose is improving  Lab Results  Component Value Date   HGBA1C 9.1 (H) 10/23/2023   HGBA1C 6.8 (A) 10/05/2022   HGBA1C 7.1 (H) 07/02/2022   Due for A1c in April/has follow up   Metformin 1000 mg bid Mounjaro 5 mg weekly -if continues to tolerate will increase dose when month is up Glipizide 2.5 xl daily -will likely stop in April Lost weight  Encouraged low glycemic diet  No retinopathy per last oph report  Microalb is high on 2nd calc -pt is taking GLP-1 and arb  Will discuss further in april      Hyperlipidemia associated with type 2 diabetes mellitus (HCC)   Pt is intol of statin  Disc goals for lipids and reasons to control them Rev last labs with pt Rev low sat fat diet in detail LDL of 122  Has had retinal artery occlusion   Per cardiology note- was  supposed to investigate option fo leqvio injection and get back to pt about coverage/ per pt has not heard

## 2024-01-16 NOTE — Assessment & Plan Note (Signed)
 Glucose is improving  Lab Results  Component Value Date   HGBA1C 9.1 (H) 10/23/2023   HGBA1C 6.8 (A) 10/05/2022   HGBA1C 7.1 (H) 07/02/2022   Due for A1c in April/has follow up   Metformin 1000 mg bid Mounjaro 5 mg weekly -if continues to tolerate will increase dose when month is up Glipizide 2.5 xl daily -will likely stop in April Lost weight  Encouraged low glycemic diet  No retinopathy per last oph report  Microalb is high on 2nd calc -pt is taking GLP-1 and arb  Will discuss further in april

## 2024-01-16 NOTE — Assessment & Plan Note (Addendum)
 This is new  Reviewed notes from [facility] on 3/11, symptoms started the 9th  Partial loss of vision in right eye  Pt thinks is improving  Risk factors include previous stroke, vasc dz, DM2, HTN He does take asa  Blood pressure is controlled   Will check esr and crp today to r/o TA (doubt this)  Per pt -he had carotid doppler and echo in December (I can only see the myoview in epic however)  Will reach out to cardiologist about this   Call back and Er precautions noted in detail today    I cannot release him to return to work  - this will be based on vision , asked him to reach out to his oph regarding that   Of note pt declines ref to neuro/stroke specialist  Encouraged to hold celebrex in light of stroke risk- he is willing to try  Exam today is reassuring -pt feels ok

## 2024-01-16 NOTE — Patient Instructions (Addendum)
 Try holding celebrex for a while and see how you do  This increases chance of stroke  You can replace with diclofenac gel topically   Labs today   Continue aspiirin 81 mg daily   I will reach out to cardiology regarding  Shot for cholesterol Last echo  Last carotid test   You need to reach out to your eye doctor regarding return to work  Your return is based on vision    We will check diabetes in April  I expect we will be able to stop glipizide then   Get serious about diet  Try to get most of your carbohydrates from produce (with the exception of white potatoes) and whole grains Eat less bread/pasta/rice/snack foods/cereals/sweets and other items from the middle of the grocery store (processed carbs)  Get rid of the sweets except for special occasions

## 2024-01-20 ENCOUNTER — Ambulatory Visit: Admitting: General Practice

## 2024-01-21 ENCOUNTER — Telehealth: Payer: Self-pay

## 2024-01-21 DIAGNOSIS — I2510 Atherosclerotic heart disease of native coronary artery without angina pectoris: Secondary | ICD-10-CM

## 2024-01-21 DIAGNOSIS — E785 Hyperlipidemia, unspecified: Secondary | ICD-10-CM

## 2024-01-21 DIAGNOSIS — I639 Cerebral infarction, unspecified: Secondary | ICD-10-CM

## 2024-01-21 NOTE — Telephone Encounter (Signed)
 Left detailed message per DPR for patient explaining that we were ordering ECHO and Carotid US and that he would be called to schedule. Asked that he call back if any questions.

## 2024-01-21 NOTE — Telephone Encounter (Signed)
 PCP inquiring about Leqvio injection, but can't find any orders sent in from [place]. Per Tessa Conte's OV note on 12/06/23:  Hyperlipidemia Elevated LDL (122) in the setting of known coronary artery disease. Patient has history of intolerance to statin therapy (Lipitor). -Initiate non-statin therapy with Inclisiran (Leqvio) injection every 6 months, pending insurance approval. -Check lipid panel 8 weeks after initiation of Inclisiran.  Will route to pharmD as I suspect that insurance coverage issues will arise with this.

## 2024-01-21 NOTE — Telephone Encounter (Signed)
-----   Message from Verne Carrow sent at 01/16/2024  3:54 PM EDT ----- I do not see an echo or carotid dopplers either.  Michalene, can we call him and arrange carotid dopplers and an echo?  Thanks, chris ----- Message ----- From: Judy Pimple, MD Sent: 01/16/2024   1:36 PM EDT To: Kathleene Hazel, MD; #  To cardiology team- pt says he had carotid testing and echo in December but I do not see in epic  He has arterial branch occlusion in right eye currently and needs stoke work up  Needs cholesterol control , was there progress on approval for Leqvio injection ?  Thanks for the help!

## 2024-01-22 NOTE — Telephone Encounter (Signed)
 I submitted start form for Leqvio.  Form will need to be signed by patient I have sent a link to his email [email or web address]  Called patient to discuss that he will need fine like in his email potential and his family and sign so that the [facility] service center can run his benefits.  Left a voicemail for patient to call back  Unsure how well Wilber Bihari will be covered by his insurance another option would be [id number].  I am unsure if patient specifically only wanted an every 12-month injection.

## 2024-01-24 ENCOUNTER — Telehealth: Payer: Self-pay | Admitting: *Deleted

## 2024-01-24 MED ORDER — TIRZEPATIDE 7.5 MG/0.5ML ~~LOC~~ SOAJ: 7.5000 mg | SUBCUTANEOUS | 0 refills | Status: DC

## 2024-01-24 NOTE — Telephone Encounter (Signed)
 Pt sent a message saying:  Take my last shot on 22 March. Are you going to up the prescription? If so, you will have to call it in next week. before they feel it for the next month.

## 2024-01-24 NOTE — Telephone Encounter (Signed)
Sent mychart letting pt know Rx sent

## 2024-01-24 NOTE — Telephone Encounter (Signed)
 I sent it in

## 2024-01-28 ENCOUNTER — Telehealth: Payer: Self-pay | Admitting: Cardiovascular Disease

## 2024-01-28 NOTE — Telephone Encounter (Signed)
 Pt returning call to you requesting cb

## 2024-01-28 NOTE — Telephone Encounter (Signed)
 Detailed message left of VM per DPR

## 2024-01-29 ENCOUNTER — Encounter: Payer: Self-pay | Admitting: Pharmacist

## 2024-01-29 NOTE — Telephone Encounter (Signed)
 See my chart message

## 2024-02-03 ENCOUNTER — Other Ambulatory Visit: Payer: Self-pay | Admitting: Cardiovascular Disease

## 2024-02-03 DIAGNOSIS — I639 Cerebral infarction, unspecified: Secondary | ICD-10-CM

## 2024-02-03 DIAGNOSIS — E785 Hyperlipidemia, unspecified: Secondary | ICD-10-CM

## 2024-02-03 DIAGNOSIS — I2510 Atherosclerotic heart disease of native coronary artery without angina pectoris: Secondary | ICD-10-CM

## 2024-02-07 ENCOUNTER — Encounter: Payer: Self-pay | Admitting: Family Medicine

## 2024-02-07 ENCOUNTER — Other Ambulatory Visit (INDEPENDENT_AMBULATORY_CARE_PROVIDER_SITE_OTHER)

## 2024-02-07 ENCOUNTER — Ambulatory Visit: Payer: BC Managed Care – PPO | Admitting: Family Medicine

## 2024-02-07 DIAGNOSIS — D696 Thrombocytopenia, unspecified: Secondary | ICD-10-CM

## 2024-02-07 DIAGNOSIS — K760 Fatty (change of) liver, not elsewhere classified: Secondary | ICD-10-CM

## 2024-02-07 DIAGNOSIS — R7401 Elevation of levels of liver transaminase levels: Secondary | ICD-10-CM

## 2024-02-07 LAB — CBC WITH DIFFERENTIAL/PLATELET
Basophils Absolute: 0 10*3/uL (ref 0.0–0.1)
Basophils Relative: 0.9 % (ref 0.0–3.0)
Eosinophils Absolute: 0.4 10*3/uL (ref 0.0–0.7)
Eosinophils Relative: 8.6 % — ABNORMAL HIGH (ref 0.0–5.0)
HCT: 46.8 % (ref 39.0–52.0)
Hemoglobin: 15.7 g/dL (ref 13.0–17.0)
Lymphocytes Relative: 21.9 % (ref 12.0–46.0)
Lymphs Abs: 1.1 10*3/uL (ref 0.7–4.0)
MCHC: 33.6 g/dL (ref 30.0–36.0)
MCV: 95.7 fl (ref 78.0–100.0)
Monocytes Absolute: 0.6 10*3/uL (ref 0.1–1.0)
Monocytes Relative: 10.9 % (ref 3.0–12.0)
Neutro Abs: 3 10*3/uL (ref 1.4–7.7)
Neutrophils Relative %: 57.7 % (ref 43.0–77.0)
Platelets: 87 10*3/uL — ABNORMAL LOW (ref 150.0–400.0)
RBC: 4.89 Mil/uL (ref 4.22–5.81)
RDW: 13.8 % (ref 11.5–15.5)
WBC: 5.2 10*3/uL (ref 4.0–10.5)

## 2024-02-07 LAB — APTT: aPTT: 33.6 s (ref 25.4–36.8)

## 2024-02-07 LAB — HEPATIC FUNCTION PANEL
ALT: 133 U/L — ABNORMAL HIGH (ref 0–53)
AST: 89 U/L — ABNORMAL HIGH (ref 0–37)
Albumin: 4.3 g/dL (ref 3.5–5.2)
Alkaline Phosphatase: 145 U/L — ABNORMAL HIGH (ref 39–117)
Bilirubin, Direct: 0.1 mg/dL (ref 0.0–0.3)
Total Bilirubin: 0.5 mg/dL (ref 0.2–1.2)
Total Protein: 7.5 g/dL (ref 6.0–8.3)

## 2024-02-07 LAB — PROTIME-INR
INR: 1 ratio (ref 0.8–1.0)
Prothrombin Time: 11 s (ref 9.6–13.1)

## 2024-02-07 NOTE — Addendum Note (Signed)
 Addended by: Alvina Chou on: 02/07/2024 09:03 AM   Modules accepted: Orders

## 2024-02-07 NOTE — Addendum Note (Signed)
 Addended by: Alvina Chou on: 02/07/2024 09:02 AM   Modules accepted: Orders

## 2024-02-14 ENCOUNTER — Ambulatory Visit (HOSPITAL_BASED_OUTPATIENT_CLINIC_OR_DEPARTMENT_OTHER)

## 2024-02-14 ENCOUNTER — Ambulatory Visit (HOSPITAL_COMMUNITY)
Admission: RE | Admit: 2024-02-14 | Discharge: 2024-02-14 | Disposition: A | Discharge status: Home / Self Care | Source: Ambulatory Visit | Attending: Cardiovascular Disease | Admitting: Cardiovascular Disease

## 2024-02-14 DIAGNOSIS — I2510 Atherosclerotic heart disease of native coronary artery without angina pectoris: Secondary | ICD-10-CM | POA: Insufficient documentation

## 2024-02-14 DIAGNOSIS — E785 Hyperlipidemia, unspecified: Secondary | ICD-10-CM

## 2024-02-14 DIAGNOSIS — I639 Cerebral infarction, unspecified: Secondary | ICD-10-CM | POA: Insufficient documentation

## 2024-02-14 LAB — ECHOCARDIOGRAM COMPLETE
Area-P 1/2: 2.95 cm2
S' Lateral: 2.9 cm

## 2024-02-14 MED ORDER — PERFLUTREN LIPID MICROSPHERE
1.0000 mL | INTRAVENOUS | Status: AC | PRN
  Administered 2024-02-14: 2 mL via INTRAVENOUS

## 2024-02-14 NOTE — Progress Notes (Unsigned)
 Pt requesting note for work due to echo this morning.

## 2024-02-27 ENCOUNTER — Other Ambulatory Visit: Payer: Self-pay | Admitting: Family Medicine

## 2024-02-27 ENCOUNTER — Other Ambulatory Visit: Payer: Self-pay | Admitting: Pharmacist

## 2024-02-27 NOTE — Telephone Encounter (Signed)
 Last filled on 01/24/24 # 2 mL/ 0 refills   Last OV was on 01/16/24 and last labs was on 02/07/24

## 2024-02-27 NOTE — Telephone Encounter (Signed)
 Copied from CRM [id number]. Topic: Clinical - Medication Refill >> Feb 27, 2024 12:02 PM Jenice Mitts wrote: Most Recent Primary Care Visit:  Provider: [facility]  Department: [facility]  Visit Type: LAB VISIT  Date: 02/07/2024  Medication: tirzepatide (MOUNJARO) 7.5 MG/0.5ML Pen  Has the patient contacted their pharmacy? Yes (Agent: If no, request that the patient contact the pharmacy for the refill. If patient does not wish to contact the pharmacy document the reason why and proceed with request.) (Agent: If yes, when and what did the pharmacy advise?)  Is this the correct pharmacy for this prescription? Yes If no, delete pharmacy and type the correct one.  This is the patient's preferred pharmacy:  [phone] [place] - [address] [place] [address] Phone: [phone] Fax: [phone]   Has the prescription been filled recently? No  Is the patient out of the medication? Yes  Has the patient been seen for an appointment in the last year OR does the patient have an upcoming appointment? Yes  Can we respond through MyChart? Yes  Agent: Please be advised that Rx refills may take up to 3 business days. We ask that you follow-up with your pharmacy.

## 2024-02-27 NOTE — Telephone Encounter (Signed)
 Do you want to go up on dose?   Due for follow up and A1c -please schedule when able / May is ok

## 2024-02-27 NOTE — Telephone Encounter (Signed)
 Left VM requesting pt to call the office back

## 2024-02-28 MED ORDER — TIRZEPATIDE 7.5 MG/0.5ML ~~LOC~~ SOAJ: 7.5000 mg | SUBCUTANEOUS | 2 refills | Status: DC

## 2024-02-28 NOTE — Telephone Encounter (Signed)
 Pt said he will call and schedule a lab appt once he is back in town. Pt said for now he wants to stay on the same dose. The reason is because he is out of town working and he doesn't want the increase dose to affect him (GI problems) and then he would have to miss work and can't f/u with PCP he would have to go to an UC. Pt said for the next few months he would like to stay on this dose since he is doing well on it, and when he is back in town in late July then he would be okay increasing the dose

## 2024-02-28 NOTE — Telephone Encounter (Signed)
 Please see prev note. Pt wants med refill but can't schedule f/u until late July, please advise

## 2024-02-28 NOTE — Telephone Encounter (Signed)
 Got it [employer] he is doing well

## 2024-02-28 NOTE — Telephone Encounter (Signed)
Duplicate request, see other refill request. 

## 2024-02-28 NOTE — Telephone Encounter (Signed)
 Understood , does he want to go up on the dose ?

## 2024-02-28 NOTE — Telephone Encounter (Signed)
 Copied from CRM [id number]. Topic: Appointments - Scheduling Inquiry for Clinic >> Feb 28, 2024  9:11 AM Daniel [name] wrote: Reason for CRM: patient stated that he is out of town working and will not be back to schedule appt until late July. Please contact patient regarding this matter.

## 2024-02-28 NOTE — Telephone Encounter (Deleted)
 Copied from CRM [id number]. Topic: Appointments - Scheduling Inquiry for Clinic >> Feb 28, 2024  9:11 AM Daniel [name] wrote: Reason for CRM: patient stated that he is out of town working and will not be back to schedule appt until late July. Please contact patient regarding this matter.

## 2024-02-28 NOTE — Telephone Encounter (Signed)
 Copied from CRM [id number]. Topic: Clinical - Medication Refill >> Feb 27, 2024 12:02 PM Jenice Mitts wrote: Most Recent Primary Care Visit:  Provider: [facility]  Department: [facility]  Visit Type: LAB VISIT  Date: 02/07/2024  Medication: tirzepatide (MOUNJARO) 7.5 MG/0.5ML Pen  Has the patient contacted their pharmacy? Yes (Agent: If no, request that the patient contact the pharmacy for the refill. If patient does not wish to contact the pharmacy document the reason why and proceed with request.) (Agent: If yes, when and what did the pharmacy advise?)  Is this the correct pharmacy for this prescription? Yes If no, delete pharmacy and type the correct one.  This is the patient's preferred pharmacy:  [id number] [place] - [address] [place] [address] Phone: [phone] Fax: [phone]   Has the prescription been filled recently? No  Is the patient out of the medication? Yes  Has the patient been seen for an appointment in the last year OR does the patient have an upcoming appointment? Yes  Can we respond through MyChart? Yes  Agent: Please be advised that Rx refills may take up to 3 business days. We ask that you follow-up with your pharmacy. >> Feb 28, 2024  3:35 PM [place] J wrote: Patient stated that he would like to stay at the same dosage for medication until he's able to follow-up with her when he get's back in town. Patient states that blood sugar has been going down and he's happy at how things are looking.  *UPDATE*  Patient does not have access to his phone at all times so I warm transferred to CAL so they can relay over a quicker update to Dr. Malissa Se.

## 2024-03-02 ENCOUNTER — Telehealth: Payer: Self-pay

## 2024-03-02 NOTE — Telephone Encounter (Signed)
 Dr. Abel Hoe and Lovett Ruck, please see denial reason below.  Auth Submission: DENIED Site of care: Site of care: [facility] Payer: BCBS commercial Medication & CPT/J Code(s) submitted: Leqvio (Inclisiran) [id number]   Authorization has been DENIED because of the following...Aaron AasAaron Aas

## 2024-03-13 ENCOUNTER — Encounter: Payer: Self-pay | Admitting: Pharmacist

## 2024-03-16 ENCOUNTER — Telehealth: Payer: Self-pay | Admitting: Pharmacy Technician

## 2024-03-16 ENCOUNTER — Other Ambulatory Visit (HOSPITAL_COMMUNITY): Payer: Self-pay

## 2024-03-16 MED ORDER — REPATHA SURECLICK 140 MG/ML ~~LOC~~ SOAJ
1.0000 mL | SUBCUTANEOUS | 11 refills | Status: DC
  Filled 2024-03-16: qty 2, 28d supply, fill #0
  Filled 2024-04-07: qty 2, 28d supply, fill #1
  Filled 2024-05-05: qty 2, 28d supply, fill #2

## 2024-03-16 NOTE — Telephone Encounter (Signed)
 Ran test claim for REPATHA. For a 28 day supply and the co-pay is 24.99 . PA is not needed at this time. Nothing saying this is a transition fill. This test claim was processed through [facility]- copay amounts may vary at other pharmacies due to pharmacy/plan contracts, or as the patient moves through the different stages of their insurance plan.

## 2024-03-17 ENCOUNTER — Other Ambulatory Visit: Payer: Self-pay

## 2024-03-17 ENCOUNTER — Other Ambulatory Visit (HOSPITAL_COMMUNITY): Payer: Self-pay

## 2024-03-18 NOTE — Telephone Encounter (Signed)
 Patient was started on Repatha

## 2024-03-27 ENCOUNTER — Other Ambulatory Visit: Payer: Self-pay | Admitting: Family Medicine

## 2024-03-27 ENCOUNTER — Telehealth: Payer: Self-pay | Admitting: Family Medicine

## 2024-03-27 MED ORDER — TIRZEPATIDE 7.5 MG/0.5ML ~~LOC~~ SOAJ: 7.5000 mg | SUBCUTANEOUS | 2 refills | Status: DC

## 2024-03-27 NOTE — Telephone Encounter (Signed)
 Duplicate, see refill encounter for today: Interface, Surescripts Out routed this conversation to [employer]

## 2024-03-27 NOTE — Telephone Encounter (Signed)
 Copied from CRM [id number]. Topic: Clinical - Medication Refill >> Mar 27, 2024 12:17 PM Kita Perish H wrote: Medication: tirzepatide (MOUNJARO) 7.5 MG/0.5ML Pen  Has the patient contacted their pharmacy? Yes, states needs provider approval he thinks (Agent: If no, request that the patient contact the pharmacy for the refill. If patient does not wish to contact the pharmacy document the reason why and proceed with request.) (Agent: If yes, when and what did the pharmacy advise?)  This is the patient's preferred pharmacy:  [phone],  - [address] [place] [address] Phone: [phone] Fax: [phone]  Is this the correct pharmacy for this prescription? Yes If no, delete pharmacy and type the correct one.   Has the prescription been filled recently? No  Is the patient out of the medication? No, due for injection tomorrow  Has the patient been seen for an appointment in the last year OR does the patient have an upcoming appointment? Yes  Can we respond through MyChart? Yes  Agent: Please be advised that Rx refills may take up to 3 business days. We ask that you follow-up with your pharmacy.

## 2024-03-27 NOTE — Telephone Encounter (Signed)
 I re sent it   Puzzling-it said he had refills but maybe I was wrong   I would like to see him back in mid June if possible for diabetes

## 2024-03-27 NOTE — Telephone Encounter (Signed)
 Copied from CRM [id number]. Topic: Clinical - Medication Refill >> Mar 27, 2024 12:17 PM Kita Perish H wrote: Medication: tirzepatide (MOUNJARO) 7.5 MG/0.5ML Pen  Has the patient contacted their pharmacy? Yes, states needs provider approval he thinks (Agent: If no, request that the patient contact the pharmacy for the refill. If patient does not wish to contact the pharmacy document the reason why and proceed with request.) (Agent: If yes, when and what did the pharmacy advise?)  This is the patient's preferred pharmacy:  [phone] [place] - [address] [place] [address] Phone: [phone] Fax: [phone]  Is this the correct pharmacy for this prescription? Yes If no, delete pharmacy and type the correct one.   Has the prescription been filled recently? No  Is the patient out of the medication? No, due for injection tomorrow  Has the patient been seen for an appointment in the last year OR does the patient have an upcoming appointment? Yes  Can we respond through MyChart? Yes  Agent: Please be advised that Rx refills may take up to 3 business days. We ask that you follow-up with your pharmacy. >> Mar 27, 2024  1:44 PM Magdalene School wrote: Patient called stating that his pharmacy informed him the prescription was denied. I clarified that the prescription has not yet been sent, and that processing may take up to 3 business days.  He mentioned that he is due for his next dose on 03/28/24 and is concerned about potentially missing it. He emphasized that the medication has been working well for him and he would like to continue taking it on schedule.

## 2024-03-31 NOTE — Telephone Encounter (Signed)
 Lvmtcb, sent mychart

## 2024-04-01 NOTE — Telephone Encounter (Signed)
 Spoke with pt and pt wanted to come in on may 30 but wasn't available pt keeping his appt in June

## 2024-04-07 ENCOUNTER — Other Ambulatory Visit (HOSPITAL_COMMUNITY): Payer: Self-pay

## 2024-04-10 ENCOUNTER — Ambulatory Visit: Admitting: Family Medicine

## 2024-04-10 ENCOUNTER — Encounter: Payer: Self-pay | Admitting: Family Medicine

## 2024-04-10 VITALS — BP 136/80 | HR 66 | Temp 98.4°F | Ht 70.0 in | Wt 256.0 lb

## 2024-04-10 DIAGNOSIS — Z7984 Long term (current) use of oral hypoglycemic drugs: Secondary | ICD-10-CM

## 2024-04-10 DIAGNOSIS — I10 Essential (primary) hypertension: Secondary | ICD-10-CM | POA: Diagnosis not present

## 2024-04-10 DIAGNOSIS — T753XXA Motion sickness, initial encounter: Secondary | ICD-10-CM

## 2024-04-10 DIAGNOSIS — K760 Fatty (change of) liver, not elsewhere classified: Secondary | ICD-10-CM

## 2024-04-10 DIAGNOSIS — E785 Hyperlipidemia, unspecified: Secondary | ICD-10-CM | POA: Diagnosis not present

## 2024-04-10 DIAGNOSIS — E1165 Type 2 diabetes mellitus with hyperglycemia: Secondary | ICD-10-CM | POA: Diagnosis not present

## 2024-04-10 DIAGNOSIS — E1169 Type 2 diabetes mellitus with other specified complication: Secondary | ICD-10-CM

## 2024-04-10 DIAGNOSIS — D696 Thrombocytopenia, unspecified: Secondary | ICD-10-CM | POA: Diagnosis not present

## 2024-04-10 DIAGNOSIS — E538 Deficiency of other specified B group vitamins: Secondary | ICD-10-CM

## 2024-04-10 LAB — LIPID PANEL
Cholesterol: 105 mg/dL (ref 0–200)
HDL: 44.4 mg/dL (ref >39.00)
LDL Cholesterol: 49 mg/dL (ref 0–99)
NonHDL: 60.81
Total CHOL/HDL Ratio: 2
Triglycerides: 58 mg/dL (ref 0.0–149.0)
VLDL: 11.6 mg/dL (ref 0.0–40.0)

## 2024-04-10 LAB — CBC WITH DIFFERENTIAL/PLATELET
Basophils Absolute: 0.1 10*3/uL (ref 0.0–0.1)
Basophils Relative: 1 % (ref 0.0–3.0)
Eosinophils Absolute: 0.5 10*3/uL (ref 0.0–0.7)
Eosinophils Relative: 9.2 % — ABNORMAL HIGH (ref 0.0–5.0)
HCT: 43.8 % (ref 39.0–52.0)
Hemoglobin: 15.1 g/dL (ref 13.0–17.0)
Lymphocytes Relative: 21.9 % (ref 12.0–46.0)
Lymphs Abs: 1.2 10*3/uL (ref 0.7–4.0)
MCHC: 34.4 g/dL (ref 30.0–36.0)
MCV: 93.5 fl (ref 78.0–100.0)
Monocytes Absolute: 0.5 10*3/uL (ref 0.1–1.0)
Monocytes Relative: 9 % (ref 3.0–12.0)
Neutro Abs: 3.1 10*3/uL (ref 1.4–7.7)
Neutrophils Relative %: 58.9 % (ref 43.0–77.0)
Platelets: 85 10*3/uL — ABNORMAL LOW (ref 150.0–400.0)
RBC: 4.69 Mil/uL (ref 4.22–5.81)
RDW: 12.9 % (ref 11.5–15.5)
WBC: 5.3 10*3/uL (ref 4.0–10.5)

## 2024-04-10 LAB — HEPATIC FUNCTION PANEL
ALT: 98 U/L — ABNORMAL HIGH (ref 0–53)
AST: 69 U/L — ABNORMAL HIGH (ref 0–37)
Albumin: 4.2 g/dL (ref 3.5–5.2)
Alkaline Phosphatase: 142 U/L — ABNORMAL HIGH (ref 39–117)
Bilirubin, Direct: 0.2 mg/dL (ref 0.0–0.3)
Total Bilirubin: 0.6 mg/dL (ref 0.2–1.2)
Total Protein: 7 g/dL (ref 6.0–8.3)

## 2024-04-10 LAB — BASIC METABOLIC PANEL WITH GFR
BUN: 17 mg/dL (ref 6–23)
CO2: 30 meq/L (ref 19–32)
Calcium: 9.5 mg/dL (ref 8.4–10.5)
Chloride: 101 meq/L (ref 96–112)
Creatinine, Ser: 0.99 mg/dL (ref 0.40–1.50)
GFR: 83.03 mL/min (ref >60.00)
Glucose, Bld: 137 mg/dL — ABNORMAL HIGH (ref 70–99)
Potassium: 4.7 meq/L (ref 3.5–5.1)
Sodium: 136 meq/L (ref 135–145)

## 2024-04-10 LAB — POCT GLYCOSYLATED HEMOGLOBIN (HGB A1C): Hemoglobin A1C: 6.1 % — AB (ref 4.0–5.6)

## 2024-04-10 MED ORDER — SCOPOLAMINE 1 MG/3DAYS TD PT72: 1.0000 | MEDICATED_PATCH | TRANSDERMAL | 0 refills | Status: DC

## 2024-04-10 MED ORDER — SILDENAFIL CITRATE 100 MG PO TABS: ORAL_TABLET | ORAL | 5 refills | Status: DC

## 2024-04-10 MED ORDER — TIRZEPATIDE 10 MG/0.5ML ~~LOC~~ SOAJ: 10.0000 mg | SUBCUTANEOUS | 0 refills | Status: DC

## 2024-04-10 NOTE — Progress Notes (Signed)
 Subjective:    Patient ID: Daniel [name], male    DOB: 1966/03/01, 58 y.o.   MRN: [id number]  HPI  Wt Readings from Last 3 Encounters:  04/10/24 256 lb (116.1 kg)  01/16/24 256 lb 4 oz (116.2 kg)  12/06/23 262 lb 3.2 oz (118.9 kg)   36.73 kg/m  Vitals:   04/10/24 0951  BP: 136/80  Pulse: 66  Temp: 98.4 F (36.9 C)  SpO2: 95%   Pt presents for follow up of DM2 , HTN and chronic medical problems  Going on a cruise to [place]  Wants a prescription for scopalamine to have on hand   Highest weight was 285 lb  Is 256 -doing well    HTN bp is stable today  No cp or palpitations or headaches or edema  No side effects to medicines  BP Readings from Last 3 Encounters:  04/10/24 136/80  01/16/24 128/68  12/06/23 (!) 160/70    Amlodipine  5 mg daily Hydrochlorothiazide  25 mg daily  Valsartan  80 mg daily   Lab Results  Component Value Date   NA 136 04/10/2024   K 4.7 04/10/2024   CO2 30 04/10/2024   GLUCOSE 137 (H) 04/10/2024   BUN 17 04/10/2024   CREATININE 0.99 04/10/2024   CALCIUM  9.5 04/10/2024   GFR 83.03 04/10/2024   GFRNONAA >60 09/26/2023    Hepatic steatosis  Lab Results  Component Value Date   ALT 98 (H) 04/10/2024   AST 69 (H) 04/10/2024   ALKPHOS 142 (H) 04/10/2024   BILITOT 0.6 04/10/2024    Drinks a beer occational on weekend  No tylenol     Fibrosis 4 Score = 5.32 (High risk)        Interpretation for patients with NAFLD          <1.30       -  F0-F1 (Low risk)          1.30-2.67 -  Indeterminate           >2.67      -  F3-F4 (High risk)      Validated for ages 2-65         Working on weight loss Taking glp-1     DM2 Lab Results  Component Value Date   HGBA1C 6.1 (A) 04/10/2024   HGBA1C 9.1 (H) 10/23/2023   HGBA1C 6.8 (A) 10/05/2022   Down to 6.1   Metformin  1000 mg bid  Moujaro 7.5 mg weekly  Glipizide  xl 2.5 mg daily  (afraid to stop it)0  Avg 120 in am  None below 89  No readings below 70   Eye exam  utd  Cut out chocolate chips   Treadmill for exercise daily  Also weights     Lab Results  Component Value Date   MICROALBUR 7.3 (H) 10/23/2023   MICROALBUR 3.4 (H) 07/02/2022   Ratio of 9.2     Hyperlipidemia Lab Results  Component Value Date   CHOL 105 04/10/2024   HDL 44.40 04/10/2024   LDLCALC 49 04/10/2024   TRIG 58.0 04/10/2024   CHOLHDL 2 04/10/2024   History of retinal artery occlusion  Is intol of statin   Now on repatha  per cardiology    Thrombycytopenia  Lab Results  Component Value Date   WBC 5.3 04/10/2024   HGB 15.1 04/10/2024   HCT 43.8 04/10/2024   MCV 93.5 04/10/2024   PLT 85.0 (L) 04/10/2024   Lab Results  Component Value Date   VITAMINB12 >1537 (  H) 10/23/2023      Patient Active Problem List   Diagnosis Date Noted   Motion sickness 04/10/2024   Branch retinal artery occlusion 01/15/2024   Hepatic steatosis 11/08/2023   Callus of foot 10/23/2023   Angina pectoris (HCC) 09/26/2023   Morbid obesity (HCC) 04/14/2021   H/O total hip arthroplasty 03/30/2020   Status post total replacement of left hip 06/03/2019   Thrombocytopenia (HCC) 12/31/2018   Left hip pain 12/15/2018   Type 2 diabetes mellitus with hyperglycemia, without long-term current use of insulin  (HCC) 07/22/2017   Elevated transaminase level 04/16/2016   Coronary artery disease involving native coronary artery of native heart without angina pectoris 03/20/2016   History of non-ST elevation myocardial infarction (NSTEMI) 03/20/2016   Hyperlipidemia associated with type 2 diabetes mellitus (HCC) 03/12/2016   NSTEMI (non-ST elevated myocardial infarction) (HCC) 03/10/2016   Prostate cancer screening 02/10/2016   H/O: CVA (cerebrovascular accident) 03/01/2015   Right knee pain 04/06/2014   BPH (benign prostatic hyperplasia) 11/16/2013   Routine general medical examination at a health care facility 11/12/2013   B12 deficiency 09/29/2007   ERECTILE DYSFUNCTION 07/01/2007    History of tobacco abuse 07/01/2007   LOW BACK PAIN, CHRONIC 07/01/2007   Primary hypertension 06/03/2007   Past Medical History:  Diagnosis Date   Arthritis    CAD (coronary artery disease)    a. NSTEMI: 100% stenosis of the distal RCA --> DES placed 5/17 CLEARED BY CARDIOLOGIST   Chronic low back pain    a. s/p back surgery.   CVA (cerebral infarction)    a. 02/2015 dyarthria 2/2 CVA involving the lateral aspect of the precentral gyrus.   DM2 (diabetes mellitus, type 2) (HCC)    a. pre-diabetic in the past. b. A1c 03/2016 elevated to 7.9.   ETOH abuse    GERD (gastroesophageal reflux disease)    History of echocardiogram    a. Mild LVH, EF 55-60%, Definity  contrast used-LV wall motion could not be adequately assessed   History of non-ST elevation myocardial infarction (NSTEMI) 03/2016   a. PCI: 3.5 x 24 mm Promus Premier DES to distal RCA   History of tobacco abuse    Hypertension    a. 02/2015 echo: 55-65%, trace TR/MR. b. 03/2016: echo with EF of 55-60%.    Myocardial infarction [facility])    Obesity    Ruptured disk    Stroke [facility])    2016   Past Surgical History:  Procedure Laterality Date   BACK SURGERY  2000   ruptured disk, L-S   CARDIAC CATHETERIZATION  05/2004   minimal CAD   CARDIAC CATHETERIZATION N/A 03/10/2016   Procedure: Left Heart Cath and Coronary Angiography;  Surgeon: Odie Benne, MD;  Location: [facility] INVASIVE CV LAB;  Service: Cardiovascular;  Laterality: N/A;   CARDIAC CATHETERIZATION N/A 03/10/2016   Procedure: Coronary Stent Intervention;  Surgeon: Odie Benne, MD;  Location: [facility] INVASIVE CV LAB;  Service: Cardiovascular;  Laterality: N/A;   CATARACT EXTRACTION W/PHACO Left 07/12/2016   Procedure: CATARACT EXTRACTION PHACO AND INTRAOCULAR LENS PLACEMENT (IOC);  Surgeon: Rosa College, MD;  Location: [facility] ORS;  Service: Ophthalmology;  Laterality: Left;  US   00:50 AP% 9.2 CDE 4.63 fluid pack lot # [id number]   CATARACT EXTRACTION W/PHACO Right  10/23/2021   Procedure: CATARACT EXTRACTION PHACO AND INTRAOCULAR LENS PLACEMENT (IOC) RIGHT DIABETIC;  Surgeon: Rosa College, MD;  Location: [facility];  Service: Ophthalmology;  Laterality: Right;  Diabetic 1.15 00:16.4  CORONARY ANGIOPLASTY     STENT 5/17   TOTAL HIP ARTHROPLASTY Left 06/03/2019   Procedure: TOTAL HIP ARTHROPLASTY;  Surgeon: Arlyne Lame, MD;  Location: [facility] ORS;  Service: Orthopedics;  Laterality: Left;   TOTAL HIP ARTHROPLASTY Right 03/30/2020   Procedure: TOTAL HIP ARTHROPLASTY;  Surgeon: Arlyne Lame, MD;  Location: [facility] ORS;  Service: Orthopedics;  Laterality: Right;  posterior    Social History   Tobacco Use   Smoking status: Former    Current packs/day: 0.00    Types: Cigarettes    Quit date: 01/18/2015    Years since quitting: 9.2   Smokeless tobacco: Never  Vaping Use   Vaping status: Never Used  Substance Use Topics   Alcohol use: Yes    Alcohol/week: 12.0 standard drinks of alcohol    Types: 12 Cans of beer per week    Comment: weekends   Drug use: No   Family History  Problem Relation Age of Onset   Dementia Mother    Hypertension Father    Heart disease Father        MI   Cancer Maternal Grandmother        lung   Heart failure Paternal Grandfather    Allergies  Allergen Reactions   Ace Inhibitors Cough   Current Outpatient Medications on File Prior to Visit  Medication Sig Dispense Refill   amLODipine  (NORVASC ) 5 MG tablet Take 1 tablet (5 mg total) by mouth daily. 90 tablet 3   aspirin  EC 81 MG tablet Take 81 mg by mouth daily. Swallow whole.     celecoxib  (CELEBREX ) 200 MG capsule Take 200 mg by mouth daily.     cyanocobalamin  (VITAMIN B12) 1000 MCG tablet Take 1,000 mcg by mouth daily.     Evolocumab  (REPATHA  SURECLICK) 140 MG/ML SOAJ Inject 140 mg into the skin every 14 (fourteen) days. Please mail 2 mL 11   glipiZIDE  (GLUCOTROL  XL) 2.5 MG 24 hr tablet TAKE 1 TABLET BY MOUTH EVERY DAY WITH BREAKFAST 90 tablet 1    hydrochlorothiazide  (HYDRODIURIL ) 25 MG tablet Take 0.5 tablets (12.5 mg total) by mouth daily. 45 tablet 3   metFORMIN  (GLUCOPHAGE ) 1000 MG tablet Take 1 tablet (1,000 mg total) by mouth 2 (two) times daily with a meal. 180 tablet 3   nitroGLYCERIN  (NITROSTAT ) 0.4 MG SL tablet Place 1 tablet (0.4 mg total) under the tongue every 5 (five) minutes as needed for chest pain (CP or SOB). 25 tablet 3   valsartan  (DIOVAN ) 80 MG tablet Take 1 tablet (80 mg total) by mouth daily. 90 tablet 3   No current facility-administered medications on file prior to visit.    Review of Systems  Constitutional:  Positive for fatigue. Negative for activity change, appetite change, fever and unexpected weight change.       Fatigue from work schedule  HENT:  Negative for congestion, rhinorrhea, sore throat and trouble swallowing.   Eyes:  Negative for pain, redness, itching and visual disturbance.  Respiratory:  Negative for cough, chest tightness, shortness of breath and wheezing.   Cardiovascular:  Negative for chest pain and palpitations.  Gastrointestinal:  Negative for abdominal pain, blood in stool, constipation, diarrhea and nausea.  Endocrine: Negative for cold intolerance, heat intolerance, polydipsia and polyuria.  Genitourinary:  Negative for difficulty urinating, dysuria, frequency and urgency.  Musculoskeletal:  Negative for arthralgias, joint swelling and myalgias.  Skin:  Negative for pallor and rash.  Neurological:  Negative for dizziness, tremors, weakness, numbness and  headaches.  Hematological:  Negative for adenopathy. Does not bruise/bleed easily.  Psychiatric/Behavioral:  Negative for decreased concentration and dysphoric mood. The patient is not nervous/anxious.        Objective:   Physical Exam Constitutional:      General: He is not in acute distress.    Appearance: Normal appearance. He is well-developed. He is obese. He is not ill-appearing or diaphoretic.  HENT:     Head:  Normocephalic and atraumatic.  Eyes:     Conjunctiva/sclera: Conjunctivae normal.     Pupils: Pupils are equal, round, and reactive to light.  Neck:     Thyroid : No thyromegaly.     Vascular: No carotid bruit or JVD.  Cardiovascular:     Rate and Rhythm: Normal rate and regular rhythm.     Heart sounds: Normal heart sounds.     No gallop.  Pulmonary:     Effort: Pulmonary effort is normal. No respiratory distress.     Breath sounds: Normal breath sounds. No wheezing or rales.  Abdominal:     General: There is no distension or abdominal bruit.     Palpations: Abdomen is soft.  Musculoskeletal:     Cervical back: Normal range of motion and neck supple.     Right lower leg: No edema.     Left lower leg: No edema.  Lymphadenopathy:     Cervical: No cervical adenopathy.  Skin:    General: Skin is warm and dry.     Coloration: Skin is not pale.     Findings: No rash.  Neurological:     Mental Status: He is alert.     Coordination: Coordination normal.     Deep Tendon Reflexes: Reflexes are normal and symmetric. Reflexes normal.  Psychiatric:        Mood and Affect: Mood normal.           Assessment & Plan:   Problem List Items Addressed This Visit       Cardiovascular and Mediastinum   Primary hypertension   bp in fair control at this time  BP Readings from Last 1 Encounters:  04/10/24 136/80   No changes needed Most recent labs reviewed  Disc lifstyle change with low sodium diet and exercise  Continues Amlodipine  5 mg daily Hydrochlorothiazide  25 mg daily  Valsartan  80 mg daily   Normal exam /pulse and regular rhythm       Relevant Medications   sildenafil  (VIAGRA ) 100 MG tablet   Other Relevant Orders   Basic metabolic panel with GFR (Completed)   Hepatic function panel (Completed)   CBC with Differential/Platelet (Completed)   Lipid panel (Completed)     Digestive   Hepatic steatosis   Losing weight with glp-1 and better diet/ exercise  Fibrosis  score last in high risk category   Drinking less alcohol No acetaminophen       Relevant Orders   Hepatic function panel (Completed)     Endocrine   Type 2 diabetes mellitus with hyperglycemia, without long-term current use of insulin  (HCC) - Primary   Lab Results  Component Value Date   HGBA1C 6.1 (A) 04/10/2024   HGBA1C 9.1 (H) 10/23/2023   HGBA1C 6.8 (A) 10/05/2022   Much improved  Metformin  1000 mg bid  Mounjaro  7.5 mg weekly -ill go up to 10 with next fill  Encouraged more resistance training to prevent muscle loss   Glipizide  xl 2.5 mg daily - is apprehensive to stop but will update if low readings  Eating better/losing weight   Microalb utd  Intol of statin        Relevant Medications   tirzepatide  (MOUNJARO ) 10 MG/0.5ML Pen   Other Relevant Orders   POCT HgB A1C (Completed)   Basic metabolic panel with GFR (Completed)   Hyperlipidemia associated with type 2 diabetes mellitus (HCC)   Lipids today  Now on repatha  from cardiology      Relevant Medications   sildenafil  (VIAGRA ) 100 MG tablet   tirzepatide  (MOUNJARO ) 10 MG/0.5ML Pen   Other Relevant Orders   Lipid panel (Completed)     Hematopoietic and Hemostatic   Thrombocytopenia (HCC)   Cbc today No new bleeding or bruising       Relevant Orders   CBC with Differential/Platelet (Completed)     Other   Motion sickness   Going on cruise and wants to have a scop patch in case needed Sent prescription today Discussed possible side effects-see avs

## 2024-04-10 NOTE — Patient Instructions (Addendum)
 Continue walking  Keep using your weights  Add some strength training to your routine, this is important for bone and brain health and can reduce your risk of falls and help your body use insulin  properly and regulate weight  Light weights, exercise bands , and internet videos are a good way to start  Yoga (chair or regular), machines , floor exercises or a gym with machines are also good options    Keep watching glucose  If it goes low - below 80 - then stop glipizide   I sent in sea sickness patch if needed  It may cause sedation/dry mouth / constipation   Also with next fill- go up to 10 mg for your generic moujaro  Let us  know if you do not tolerate it or have any problems

## 2024-04-12 ENCOUNTER — Ambulatory Visit: Payer: Self-pay | Admitting: Family Medicine

## 2024-04-12 NOTE — Assessment & Plan Note (Signed)
 Lipids today  Now on repatha  from cardiology

## 2024-04-12 NOTE — Assessment & Plan Note (Signed)
 Losing weight with glp-1 and better diet/ exercise  Fibrosis score last in high risk category   Drinking less alcohol No acetaminophen 

## 2024-04-12 NOTE — Assessment & Plan Note (Signed)
 bp in fair control at this time  BP Readings from Last 1 Encounters:  04/10/24 136/80   No changes needed Most recent labs reviewed  Disc lifstyle change with low sodium diet and exercise  Continues Amlodipine  5 mg daily Hydrochlorothiazide  25 mg daily  Valsartan  80 mg daily   Normal exam /pulse and regular rhythm

## 2024-04-12 NOTE — Assessment & Plan Note (Signed)
Cbc today  No new bleeding or bruising 

## 2024-04-12 NOTE — Assessment & Plan Note (Signed)
 Lab Results  Component Value Date   HGBA1C 6.1 (A) 04/10/2024   HGBA1C 9.1 (H) 10/23/2023   HGBA1C 6.8 (A) 10/05/2022   Much improved  Metformin  1000 mg bid  Mounjaro  7.5 mg weekly -ill go up to 10 with next fill  Encouraged more resistance training to prevent muscle loss   Glipizide  xl 2.5 mg daily - is apprehensive to stop but will update if low readings  Eating better/losing weight   Microalb utd  Intol of statin

## 2024-04-12 NOTE — Assessment & Plan Note (Signed)
 Going on cruise and wants to have a scop patch in case needed Sent prescription today Discussed possible side effects-see avs

## 2024-04-15 NOTE — Telephone Encounter (Signed)
 Copied from CRM [id number]. Topic: Clinical - Lab/Test Results >> Apr 15, 2024  9:49 AM Bambi Bonine D wrote: Reason for CRM: Pt was made aware of recent lab results and will callback to schedule 3 month follow up due to being out of town.

## 2024-04-23 ENCOUNTER — Other Ambulatory Visit: Payer: Self-pay | Admitting: Family Medicine

## 2024-05-04 ENCOUNTER — Encounter: Payer: Self-pay | Admitting: *Deleted

## 2024-05-12 ENCOUNTER — Encounter: Payer: Self-pay | Admitting: Gastroenterology

## 2024-05-12 ENCOUNTER — Telehealth: Payer: Self-pay | Admitting: Cardiovascular Disease

## 2024-05-12 DIAGNOSIS — I2510 Atherosclerotic heart disease of native coronary artery without angina pectoris: Secondary | ICD-10-CM

## 2024-05-12 DIAGNOSIS — E785 Hyperlipidemia, unspecified: Secondary | ICD-10-CM

## 2024-05-12 DIAGNOSIS — I639 Cerebral infarction, unspecified: Secondary | ICD-10-CM

## 2024-05-12 NOTE — Telephone Encounter (Signed)
 Pt c/o medication issue:  1. Name of Medication:   Evolocumab  (REPATHA  SURECLICK) 140 MG/ML SOAJ   2. How are you currently taking this medication (dosage and times per day)?   Patient stated he stopped taking this medication 2 months ago  3. Are you having a reaction (difficulty breathing--STAT)?   Aches all over  4. What is your medication issue?   Patient stated this medication has been causing him to have pains in his muscles all over and wants to get alternate medication and wants advice on next steps.  Patient stated can leave voice message or send him a message through MyChart.

## 2024-05-15 NOTE — Addendum Note (Signed)
 Addended by: Tashyra Adduci D on: 05/15/2024 12:44 PM   Modules accepted: Orders

## 2024-05-20 ENCOUNTER — Other Ambulatory Visit: Payer: Self-pay | Admitting: Family Medicine

## 2024-05-20 NOTE — Telephone Encounter (Unsigned)
 Copied from CRM [id number]. Topic: Clinical - Medication Refill >> May 20, 2024  9:09 AM Robinson H wrote: Medication: tirzepatide  (MOUNJARO ) 10 MG/0.5ML Pen  Has the patient contacted their pharmacy? Yes, reach out to provider (Agent: If no, request that the patient contact the pharmacy for the refill. If patient does not wish to contact the pharmacy document the reason why and proceed with request.) (Agent: If yes, when and what did the pharmacy advise?)  This is the patient's preferred pharmacy[facility]  [phone] [place] - [address] [place] [address] Phone: [phone] Fax: [phone]   Is this the correct pharmacy for this prescription? Yes If no, delete pharmacy and type the correct one.   Has the prescription been filled recently? No  Is the patient out of the medication? Yes  Has the patient been seen for an appointment in the last year OR does the patient have an upcoming appointment? Yes  Can we respond through MyChart? Yes  Agent: Please be advised that Rx refills may take up to 3 business days. We ask that you follow-up with your pharmacy.

## 2024-05-21 NOTE — Telephone Encounter (Signed)
 Last filled on 04/10/24 #2 mL/ 0 refills   Last OV was on 04/10/24

## 2024-05-21 NOTE — Telephone Encounter (Signed)
 Do you want to go up on the dose?

## 2024-05-22 ENCOUNTER — Other Ambulatory Visit: Payer: Self-pay | Admitting: Family Medicine

## 2024-05-22 MED ORDER — TIRZEPATIDE 10 MG/0.5ML ~~LOC~~ SOAJ: 10.0000 mg | SUBCUTANEOUS | 3 refills | Status: DC

## 2024-05-22 NOTE — Telephone Encounter (Signed)
 Pt said he wants to stay on this dose he is on right now and asked if PCP can place refills on file for this dose so we don't call and ask him about it every month.

## 2024-06-08 ENCOUNTER — Other Ambulatory Visit: Payer: Self-pay

## 2024-06-08 ENCOUNTER — Other Ambulatory Visit (HOSPITAL_COMMUNITY): Payer: Self-pay

## 2024-06-08 MED ORDER — REPATHA SURECLICK 140 MG/ML ~~LOC~~ SOAJ
1.0000 mL | SUBCUTANEOUS | 1 refills | Status: AC
  Filled 2024-06-08: qty 2, 28d supply, fill #0
  Filled 2024-06-30: qty 2, 28d supply, fill #1
  Filled 2024-07-28: qty 2, 28d supply, fill #2
  Filled 2024-08-25: qty 2, 28d supply, fill #3
  Filled 2024-09-15: qty 2, 28d supply, fill #4
  Filled 2024-10-13: qty 2, 28d supply, fill #5

## 2024-06-08 NOTE — Addendum Note (Signed)
 Addended by: DARRELL BRUCKNER on: 06/08/2024 11:49 AM   Modules accepted: Orders

## 2024-06-10 ENCOUNTER — Telehealth: Admitting: General Practice

## 2024-06-10 ENCOUNTER — Ambulatory Visit: Payer: Self-pay

## 2024-06-10 VITALS — Temp 97.6°F | Ht 70.0 in | Wt 255.0 lb

## 2024-06-10 DIAGNOSIS — J069 Acute upper respiratory infection, unspecified: Secondary | ICD-10-CM | POA: Diagnosis not present

## 2024-06-10 MED ORDER — BENZONATATE 200 MG PO CAPS: 200.0000 mg | ORAL_CAPSULE | Freq: Three times a day (TID) | ORAL | 0 refills | Status: DC | PRN

## 2024-06-10 NOTE — Telephone Encounter (Signed)
 Aware Thanks for seeing him

## 2024-06-10 NOTE — Telephone Encounter (Signed)
                   FYI Only or Action Required?: FYI only for provider.  Patient was last seen in primary care on 04/10/2024 by Randeen Laine LABOR, MD.  Called Nurse Triage reporting Cough.  Symptoms began yesterday.  Interventions attempted: Nothing.  Symptoms are: gradually worsening.  Triage Disposition: See PCP today  Patient/caregiver understands and will follow disposition?: Message from [facility] E sent at 06/10/2024  8:04 AM EDT  patient calling, coughing, runny nose, diarrhea, body aches all over, started yesterday 06/09/24  Patient was informed Nurse Triage will be calling back within an hour.    Call History  Contact Date/Time Type Contact Phone/Fax By  06/10/2024 08:00 AM EDT Phone (Incoming) [name], Daniel Oestreich (Self) [phone] (M) [facility] [employer]   Reason for Disposition  [1] Sore throat with cough/cold symptoms AND [2] present < 5 days  Answer Assessment - Initial Assessment Questions 1. ONSET: When did the throat start hurting? (Hours or days ago)      Yesterday  2. SEVERITY: How bad is the sore throat? (Scale 1-10; mild, moderate or severe)     7 3. STREP EXPOSURE: Has there been any exposure to strep within the past week? If Yes, ask: What type of contact occurred?      *No Answer* 4.  VIRAL SYMPTOMS: Are there any symptoms of a cold, such as a runny nose, cough, hoarse voice or red eyes?      yes 5. FEVER: Do you have a fever? If Yes, ask: What is your temperature, how was it measured, and when did it start?     no 6. PUS ON THE TONSILS: Is there pus on the tonsils in the back of your throat?     no 7. OTHER SYMPTOMS: Do you have any other symptoms? (e.g., difficulty breathing, headache, rash)     Cough, runny nose, headache, body aches  8. PREGNANCY: Is there any chance you are pregnant? When was your last menstrual period?     N/a  Protocols used: Sore Throat-A-AH

## 2024-06-10 NOTE — Patient Instructions (Addendum)
 You can try a few things over the counter to help with your symptoms including:  Cough: Delsym or Robitussin (get the off brand, works just as well) Chest Congestion: Mucinex (plain) Nasal Congestion/Ear Pressure/Sinus Pressure: Try using Flonase (fluticasone) nasal spray. Instill 1 spray in each nostril twice daily. This can be purchased over the counter. Body aches, fevers, headache: Ibuprofen (not to exceed 2400 mg in 24 hours)  Runny Nose/Throat Drainage/Sneezing/Itchy or Watery Eyes: An antihistamine such as Zyrtec, Claritin, Xyzal, Allegra  You should be feeling better by day seven of symptoms, but please do schedule an appointment if this is not the case.   Start Benzonatate  capsules for cough. Take 1 capsule by mouth three times daily as needed for cough.  Please schedule follow up in the office if you are not feeling better or worse.  As discussed please go to the ER if you have develop chest pain, shortness of breath or difficulty breathing.   It was a pleasure meeting you!

## 2024-06-10 NOTE — Progress Notes (Signed)
 Virtual Visit via Video Note  I connected with Daniel [name] on 06/10/24 at  3:40 PM EDT by a video enabled telemedicine application and verified that I am speaking with the correct person using two identifiers.  Patient Location: Home Provider Location: Office/Clinic  I discussed the limitations, risks, security, and privacy concerns of performing an evaluation and management service by video and the availability of in person appointments. I also discussed with the patient that there may be a patient responsible charge related to this service. The patient expressed understanding and agreed to proceed.  Subjective: PCP: Randeen Laine LABOR, MD  Chief Complaint  Patient presents with   Cough    Sore throat, runny nose, diarrhea and muscle aches since yesterday. Patient states he did a covid test at home yesterday and was negative. Patient has not taking anything for his sx due to his liver elevations and states Dr. Randeen will usually recommend or prescribe him antibiotics.     Cough    Daniel [name] is a 58 year old male, patient of Dr. Randeen, with past medical history of HTN, NSTEMI, HLD, type 2 DM, hx of tobacco abuse, hx of CVA, obesity presents today for an acute visit.   Symptom onset yesterday with sore throat, runny nose, diarrhea and muscle aches. Home covid test negative yesterday.  Dry cough, post nasal drip, sinus pain and pressure.   No fever, shortness of breath, chest pain or difficulty breathing.  Diarrhea has gotten better today.  Sore throat, runny nose and muscle aches are most bothersome at the moment.   Hx of elevated LFTs so did not take anything over the counter.   ROS: Per HPI  Current Outpatient Medications:    amLODipine  (NORVASC ) 5 MG tablet, Take 1 tablet (5 mg total) by mouth daily., Disp: 90 tablet, Rfl: 3   aspirin  EC 81 MG tablet, Take 81 mg by mouth daily. Swallow whole., Disp: , Rfl:    benzonatate  (TESSALON ) 200 MG capsule, Take 1 capsule (200 mg  total) by mouth 3 (three) times daily as needed for cough., Disp: 20 capsule, Rfl: 0   celecoxib  (CELEBREX ) 200 MG capsule, Take 200 mg by mouth daily., Disp: , Rfl:    cyanocobalamin  (VITAMIN B12) 1000 MCG tablet, Take 1,000 mcg by mouth daily., Disp: , Rfl:    Evolocumab  (REPATHA  SURECLICK) 140 MG/ML SOAJ, Inject 140 mg into the skin every 14 (fourteen) days., Disp: 6 mL, Rfl: 1   glipiZIDE  (GLUCOTROL  XL) 2.5 MG 24 hr tablet, TAKE 1 TABLET BY MOUTH EVERY DAY WITH BREAKFAST, Disp: 90 tablet, Rfl: 1   hydrochlorothiazide  (HYDRODIURIL ) 25 MG tablet, Take 0.5 tablets (12.5 mg total) by mouth daily., Disp: 45 tablet, Rfl: 3   metFORMIN  (GLUCOPHAGE ) 1000 MG tablet, Take 1 tablet (1,000 mg total) by mouth 2 (two) times daily with a meal., Disp: 180 tablet, Rfl: 3   nitroGLYCERIN  (NITROSTAT ) 0.4 MG SL tablet, Place 1 tablet (0.4 mg total) under the tongue every 5 (five) minutes as needed for chest pain (CP or SOB)., Disp: 25 tablet, Rfl: 3   sildenafil  (VIAGRA ) 100 MG tablet, TAKE 0.5-1 TABLETS BY MOUTH DAILY AS NEEDED FOR ERECTILE DYSFUNCTION., Disp: 5 tablet, Rfl: 5   tirzepatide  (MOUNJARO ) 10 MG/0.5ML Pen, Inject 10 mg into the skin once a week., Disp: 2 mL, Rfl: 3   valsartan  (DIOVAN ) 80 MG tablet, Take 1 tablet (80 mg total) by mouth daily., Disp: 90 tablet, Rfl: 3  Observations/Objective: Today's Vitals   06/10/24  1147  Temp: 97.6 F (36.4 C)  TempSrc: Oral  Weight: 255 lb (115.7 kg)  Height: 5' 10 (1.778 m)   Physical Exam Nursing note reviewed.  Constitutional:      Appearance: Normal appearance.  Eyes:     Conjunctiva/sclera: Conjunctivae normal.  Pulmonary:     Effort: Pulmonary effort is normal.  Neurological:     Mental Status: He is alert and oriented to person, place, and time.  Psychiatric:        Mood and Affect: Mood normal.        Behavior: Behavior normal.        Thought Content: Thought content normal.        Judgment: Judgment normal.     Assessment and  Plan: Viral URI with cough Assessment & Plan: Symptom onset yesterday.  Diarrhea has improved.  Too early to tell if there is bacterial involvement.  Patient is requesting antibiotics. Patient advised that I will discuss with PCP and let him know.   Recommendations given for symptoms management such as zyrtec or claritin and flonase for runny nose, and ibuprofen for body aches given his elevated LFTs.   He will update me in the next few days if he is not better.  Work note provided via Clinical cytogeneticist to return to work on Saturday 06/13/24. ER precautions discussed.  Orders: -     Benzonatate ; Take 1 capsule (200 mg total) by mouth 3 (three) times daily as needed for cough.  Dispense: 20 capsule; Refill: 0    Follow Up Instructions: Return if symptoms worsen or fail to improve.   I discussed the assessment and treatment plan with the patient. The patient was provided an opportunity to ask questions, and all were answered. The patient agreed with the plan and demonstrated an understanding of the instructions.   The patient was advised to call back or seek an in-person evaluation if the symptoms worsen or if the condition fails to improve as anticipated.  The above assessment and management plan was discussed with the patient. The patient verbalized understanding of and has agreed to the management plan.   Carrol Aurora, NP

## 2024-06-10 NOTE — Assessment & Plan Note (Signed)
 Symptom onset yesterday.  Diarrhea has improved.  Too early to tell if there is bacterial involvement.  Patient is requesting antibiotics. Patient advised that I will discuss with PCP and let him know.   Recommendations given for symptoms management such as zyrtec or claritin and flonase for runny nose, and ibuprofen for body aches given his elevated LFTs.   He will update me in the next few days if he is not better.  Work note provided via Clinical cytogeneticist to return to work on Saturday 06/13/24. ER precautions discussed.

## 2024-06-10 NOTE — Telephone Encounter (Signed)
 Appt scheduled with Carrol, NP today. FYI to PCP and [facility]

## 2024-06-11 ENCOUNTER — Telehealth: Payer: Self-pay | Admitting: *Deleted

## 2024-06-11 DIAGNOSIS — J069 Acute upper respiratory infection, unspecified: Secondary | ICD-10-CM

## 2024-06-11 MED ORDER — AZITHROMYCIN 250 MG PO TABS: ORAL_TABLET | ORAL | 0 refills | Status: AC

## 2024-06-11 NOTE — Telephone Encounter (Signed)
 Copied from CRM [id number]. Topic: Clinical - Medication Question >> Jun 11, 2024  8:23 AM Daniel [name] wrote: Reason for CRM: Patient calling back to report that the cough medication is not helping him, wanting to know if he can get an antibiotic called in

## 2024-06-11 NOTE — Telephone Encounter (Signed)
 Routing to treating provider of pt's sxs

## 2024-06-11 NOTE — Telephone Encounter (Signed)
 Please advise patient that I have sent in the zpack. Take two tablets today and then one tablet daily until finished.

## 2024-06-11 NOTE — Telephone Encounter (Signed)
 Pt notified as instructed and pt voiced understanding.pt appreciative of abx and if symptoms are not cleared when finishes abx or if pt worsens he will call :LBSC.

## 2024-06-29 ENCOUNTER — Ambulatory Visit: Admitting: Family Medicine

## 2024-06-29 ENCOUNTER — Encounter: Payer: Self-pay | Admitting: Family Medicine

## 2024-06-29 ENCOUNTER — Ambulatory Visit: Payer: Self-pay

## 2024-06-29 VITALS — BP 130/82 | HR 73 | Temp 98.3°F | Ht 70.0 in | Wt 265.0 lb

## 2024-06-29 DIAGNOSIS — L03114 Cellulitis of left upper limb: Secondary | ICD-10-CM | POA: Insufficient documentation

## 2024-06-29 MED ORDER — SULFAMETHOXAZOLE-TRIMETHOPRIM 800-160 MG PO TABS
1.0000 | ORAL_TABLET | Freq: Two times a day (BID) | ORAL | 0 refills | Status: DC
Start: 2024-06-29 — End: 2024-07-07

## 2024-06-29 MED ORDER — CEFTRIAXONE SODIUM 1 G IJ SOLR
1.0000 g | Freq: Once | INTRAMUSCULAR | Status: AC
  Administered 2024-06-29: 1 g via INTRAMUSCULAR

## 2024-06-29 MED ORDER — CEPHALEXIN 500 MG PO CAPS: 500.0000 mg | ORAL_CAPSULE | Freq: Three times a day (TID) | ORAL | 0 refills | Status: DC

## 2024-06-29 NOTE — Telephone Encounter (Signed)
 Will see patient then Agree with ER and UC precautions

## 2024-06-29 NOTE — Progress Notes (Signed)
 Subjective:    Patient ID: Daniel [name], male    DOB: 04-Oct-1966, 58 y.o.   MRN: [id number]  HPI  Wt Readings from Last 3 Encounters:  06/29/24 265 lb (120.2 kg)  06/10/24 255 lb (115.7 kg)  04/10/24 256 lb (116.1 kg)   38.02 kg/m  Vitals:   06/29/24 1050  BP: 130/82  Pulse: 73  Temp: 98.3 F (36.8 C)  SpO2: 96%    Pt presents for c/o  Boil on left wrist   Started as a pimple like bump on Friday  Now red / swollen and painful  Hurts to move his hand (tried to work- could not do anything)   No fever   Is right handed     History of mrsa skin infection in 2011   Since then had pre surgical screens for mrsa in 2020 and 2021 that were negative    Dm2 Lab Results  Component Value Date   HGBA1C 6.1 (A) 04/10/2024   HGBA1C 9.1 (H) 10/23/2023   HGBA1C 6.8 (A) 10/05/2022   Moujaro 10 mg weekly Glipizide  xl 1.5 mg daily     Lab Results  Component Value Date   WBC 5.3 04/10/2024   HGB 15.1 04/10/2024   HCT 43.8 04/10/2024   MCV 93.5 04/10/2024   PLT 85.0 (L) 04/10/2024   Lab Results  Component Value Date   NA 136 04/10/2024   K 4.7 04/10/2024   CO2 30 04/10/2024   GLUCOSE 137 (H) 04/10/2024   BUN 17 04/10/2024   CREATININE 0.99 04/10/2024   CALCIUM  9.5 04/10/2024   GFR 83.03 04/10/2024   GFRNONAA >60 09/26/2023      Patient Active Problem List   Diagnosis Date Noted   Cellulitis of left arm 06/29/2024   Viral URI with cough 06/10/2024   Motion sickness 04/10/2024   Branch retinal artery occlusion 01/15/2024   Hepatic steatosis 11/08/2023   Callus of foot 10/23/2023   Angina pectoris (HCC) 09/26/2023   Morbid obesity (HCC) 04/14/2021   H/O total hip arthroplasty 03/30/2020   Status post total replacement of left hip 06/03/2019   Thrombocytopenia (HCC) 12/31/2018   Left hip pain 12/15/2018   Type 2 diabetes mellitus with hyperglycemia, without long-term current use of insulin  (HCC) 07/22/2017   Elevated transaminase level 04/16/2016    Coronary artery disease involving native coronary artery of native heart without angina pectoris 03/20/2016   History of non-ST elevation myocardial infarction (NSTEMI) 03/20/2016   Hyperlipidemia associated with type 2 diabetes mellitus (HCC) 03/12/2016   NSTEMI (non-ST elevated myocardial infarction) (HCC) 03/10/2016   Prostate cancer screening 02/10/2016   H/O: CVA (cerebrovascular accident) 03/01/2015   Right knee pain 04/06/2014   BPH (benign prostatic hyperplasia) 11/16/2013   Routine general medical examination at a health care facility 11/12/2013   B12 deficiency 09/29/2007   ERECTILE DYSFUNCTION 07/01/2007   History of tobacco abuse 07/01/2007   LOW BACK PAIN, CHRONIC 07/01/2007   Primary hypertension 06/03/2007   Past Medical History:  Diagnosis Date   Arthritis    CAD (coronary artery disease)    a. NSTEMI: 100% stenosis of the distal RCA --> DES placed 5/17 CLEARED BY CARDIOLOGIST   Chronic low back pain    a. s/p back surgery.   CVA (cerebral infarction)    a. 02/2015 dyarthria 2/2 CVA involving the lateral aspect of the precentral gyrus.   DM2 (diabetes mellitus, type 2) (HCC)    a. pre-diabetic in the past. b. A1c 03/2016 elevated to  7.9.   ETOH abuse    GERD (gastroesophageal reflux disease)    History of echocardiogram    a. Mild LVH, EF 55-60%, Definity  contrast used-LV wall motion could not be adequately assessed   History of non-ST elevation myocardial infarction (NSTEMI) 03/2016   a. PCI: 3.5 x 24 mm Promus Premier DES to distal RCA   History of tobacco abuse    Hypertension    a. 02/2015 echo: 55-65%, trace TR/MR. b. 03/2016: echo with EF of 55-60%.    Myocardial infarction [facility])    Obesity    Ruptured disk    Stroke [facility])    2016   Past Surgical History:  Procedure Laterality Date   BACK SURGERY  2000   ruptured disk, L-S   CARDIAC CATHETERIZATION  05/2004   minimal CAD   CARDIAC CATHETERIZATION N/A 03/10/2016   Procedure: Left Heart Cath and  Coronary Angiography;  Surgeon: Lonni JONETTA Cash, MD;  Location: [facility] INVASIVE CV LAB;  Service: Cardiovascular;  Laterality: N/A;   CARDIAC CATHETERIZATION N/A 03/10/2016   Procedure: Coronary Stent Intervention;  Surgeon: Lonni JONETTA Cash, MD;  Location: [facility] INVASIVE CV LAB;  Service: Cardiovascular;  Laterality: N/A;   CATARACT EXTRACTION W/PHACO Left 07/12/2016   Procedure: CATARACT EXTRACTION PHACO AND INTRAOCULAR LENS PLACEMENT (IOC);  Surgeon: Adine Oneil Novak, MD;  Location: [facility] ORS;  Service: Ophthalmology;  Laterality: Left;  US   00:50 AP% 9.2 CDE 4.63 fluid pack lot # [id number]   CATARACT EXTRACTION W/PHACO Right 10/23/2021   Procedure: CATARACT EXTRACTION PHACO AND INTRAOCULAR LENS PLACEMENT (IOC) RIGHT DIABETIC;  Surgeon: Novak Adine Oneil, MD;  Location[facility];  Service: Ophthalmology;  Laterality: Right;  Diabetic 1.15 00:16.4   CORONARY ANGIOPLASTY     STENT 5/17   TOTAL HIP ARTHROPLASTY Left 06/03/2019   Procedure: TOTAL HIP ARTHROPLASTY;  Surgeon: Mardee Lynwood SQUIBB, MD;  Location: [facility] ORS;  Service: Orthopedics;  Laterality: Left;   TOTAL HIP ARTHROPLASTY Right 03/30/2020   Procedure: TOTAL HIP ARTHROPLASTY;  Surgeon: Mardee Lynwood SQUIBB, MD;  Location: [facility] ORS;  Service: Orthopedics;  Laterality: Right;  posterior    Social History   Tobacco Use   Smoking status: Former    Current packs/day: 0.00    Types: Cigarettes    Quit date: 01/18/2015    Years since quitting: 9.4   Smokeless tobacco: Never  Vaping Use   Vaping status: Never Used  Substance Use Topics   Alcohol use: Yes    Alcohol/week: 12.0 standard drinks of alcohol    Types: 12 Cans of beer per week    Comment: weekends   Drug use: No   Family History  Problem Relation Age of Onset   Dementia Mother    Hypertension Father    Heart disease Father        MI   Cancer Maternal Grandmother        lung   Heart failure Paternal Grandfather    Allergies  Allergen Reactions   Ace  Inhibitors Cough   Current Outpatient Medications on File Prior to Visit  Medication Sig Dispense Refill   amLODipine  (NORVASC ) 5 MG tablet Take 1 tablet (5 mg total) by mouth daily. 90 tablet 3   aspirin  EC 81 MG tablet Take 81 mg by mouth daily. Swallow whole.     benzonatate  (TESSALON ) 200 MG capsule Take 1 capsule (200 mg total) by mouth 3 (three) times daily as needed for cough. 20 capsule 0   celecoxib  (CELEBREX ) 200 MG  capsule Take 200 mg by mouth daily.     cyanocobalamin  (VITAMIN B12) 1000 MCG tablet Take 1,000 mcg by mouth daily.     Evolocumab  (REPATHA  SURECLICK) 140 MG/ML SOAJ Inject 140 mg into the skin every 14 (fourteen) days. 6 mL 1   glipiZIDE  (GLUCOTROL  XL) 2.5 MG 24 hr tablet TAKE 1 TABLET BY MOUTH EVERY DAY WITH BREAKFAST 90 tablet 1   hydrochlorothiazide  (HYDRODIURIL ) 25 MG tablet Take 0.5 tablets (12.5 mg total) by mouth daily. 45 tablet 3   metFORMIN  (GLUCOPHAGE ) 1000 MG tablet Take 1 tablet (1,000 mg total) by mouth 2 (two) times daily with a meal. 180 tablet 3   nitroGLYCERIN  (NITROSTAT ) 0.4 MG SL tablet Place 1 tablet (0.4 mg total) under the tongue every 5 (five) minutes as needed for chest pain (CP or SOB). 25 tablet 3   sildenafil  (VIAGRA ) 100 MG tablet TAKE 0.5-1 TABLETS BY MOUTH DAILY AS NEEDED FOR ERECTILE DYSFUNCTION. 5 tablet 5   tirzepatide  (MOUNJARO ) 10 MG/0.5ML Pen Inject 10 mg into the skin once a week. 2 mL 3   valsartan  (DIOVAN ) 80 MG tablet Take 1 tablet (80 mg total) by mouth daily. 90 tablet 3   No current facility-administered medications on file prior to visit.    Review of Systems  Constitutional:  Positive for fatigue. Negative for chills and fever.  Respiratory:  Negative for shortness of breath.   Skin:  Positive for color change.       Red painful lump on right forearm   Neurological:  Negative for weakness and numbness.       Objective:   Physical Exam Constitutional:      General: He is not in acute distress.    Appearance: Normal  appearance. He is obese. He is not ill-appearing or diaphoretic.  Eyes:     Conjunctiva/sclera: Conjunctivae normal.     Pupils: Pupils are equal, round, and reactive to light.  Cardiovascular:     Rate and Rhythm: Normal rate and regular rhythm.  Pulmonary:     Effort: Pulmonary effort is normal. No respiratory distress.  Skin:    Findings: Erythema present.     Comments: 2-3 cm area of erythema and induration on right distal/dorsal forearm above wrist  Small scab  No active drainage Very firm/ not fluctuant  Moderately tender  No streaking   Neurological:     Mental Status: He is alert.     Sensory: No sensory deficit.     Motor: No weakness.           Assessment & Plan:   Problem List Items Addressed This Visit       Other   Cellulitis of left arm - Primary   Left foream above wrist  Round area, firm (not fluctuant)  Started as papule and swelled after pt scratched it  Pt had mrsa in distant past and is diabetic   Rocephin  IM 1g today Keflex  500 mg tid Bactrim  ds bid (to cover for mrsa) Clean with soap and water Warm compress and elevation  Work note  Follow up later this week for re check If worse before then call and go to ER  Call back and Er precautions noted in detail today

## 2024-06-29 NOTE — Assessment & Plan Note (Addendum)
 Left foream above wrist  Round area, firm (not fluctuant) with central scab Started as papule and swelled after pt scratched it  Pt had mrsa in distant past and is diabetic  Unable to I and D or aspirate / too firm   Rocephin  IM 1g today Keflex  500 mg tid Bactrim  ds bid (to cover for mrsa) Clean with soap and water Warm compress and elevation  Work note  Follow up later this week for re check If worse before then call and go to ER  Call back and Er precautions noted in detail today

## 2024-06-29 NOTE — Patient Instructions (Signed)
 Follow up Thursday or Friday for re check   Watch for increased redness/streaking/ pain or drainage Keep us  posted  If severe-go to the ER  Clean with soap and water  Elevate arm when you can  Use a warm compress   Rocephin  shot today   Take orally as directed  Keflex   Bactrim 

## 2024-06-29 NOTE — Telephone Encounter (Signed)
 Appt scheduled this morning with PCP

## 2024-06-29 NOTE — Telephone Encounter (Signed)
 FYI Only or Action Required?: FYI only for provider.  Patient was last seen in primary care on 06/10/2024 by Daniel Shivers, NP.  Called Nurse Triage reporting Recurrent Skin Infections.  Symptoms began FRiday.  Interventions attempted: Nothing.  Symptoms are: gradually worsening.  Triage Disposition: See PCP When Office is Open (Within 3 Days)  Patient/caregiver understands and will follow disposition?: Yes      Copied from CRM #[id number]. Topic: Clinical - Red Word Triage >> Jun 29, 2024  8:03 AM Daniel [name] wrote: [facility] that prompted transfer to Nurse Triage: Patient has [name] boil possible or staph infection on left wrist area. Patient said it is very painful and he can not hardly move his wrist. Reason for Disposition  Boil > 1/2 inch across (> 12 mm; larger than [name] marble)  Answer Assessment - Initial Assessment Questions 1. APPEARANCE of BOIL: What does the boil look like?      Redness, swelling, tightness, warm 2. LOCATION: Where is the boil located?      Left wrist area 3. NUMBER: How many boils are there?      1 4. SIZE: How big is the boil? (e.g., inches, cm; compare to size of [name] coin or other object)     Quarter size 5. ONSET: When did the boil start?     Friday 6. PAIN: Is there any pain? If Yes, ask: How bad is the pain?   (Scale 1-10; or mild, moderate, severe)     8/10 7. FEVER: Do you have [name] fever? If Yes, ask: What is it, how was it measured, and when did it start?      no 8. SOURCE: Have you been around anyone with boils or other Staph infections? Have you ever had boils before?     yes 9. OTHER SYMPTOMS: Do you have any other symptoms? (e.g., shaking chills, weakness, rash elsewhere on body)     no 10. PREGNANCY: Is there any chance you are pregnant? When was your last menstrual period?       na  Protocols used: Boil (Skin Abscess)-[name]-AH

## 2024-07-02 ENCOUNTER — Encounter: Payer: Self-pay | Admitting: Family Medicine

## 2024-07-02 ENCOUNTER — Ambulatory Visit: Admitting: Family Medicine

## 2024-07-02 VITALS — BP 118/76 | HR 70 | Temp 98.3°F | Ht 70.0 in | Wt 256.4 lb

## 2024-07-02 DIAGNOSIS — L03114 Cellulitis of left upper limb: Secondary | ICD-10-CM

## 2024-07-02 NOTE — Assessment & Plan Note (Addendum)
 Significant improvement with  Keflex  500 mg tid  Bactrim  ds bid   Less red/swollen Now draining  Much less pressure and pain   Today cleaned and wrapped in soft guaze   Encouraged to finish antibiotics and update us  on Tuesday  Keep clean  Warm compress  Finish antibiotic  Call if anything worsens   Call back and Er precautions noted in detail today

## 2024-07-02 NOTE — Progress Notes (Signed)
 Subjective:    Patient ID: Daniel [name], male    DOB: 12/13/1965, 58 y.o.   MRN: [id number]  HPI  Wt Readings from Last 3 Encounters:  07/02/24 256 lb 6 oz (116.3 kg)  06/29/24 265 lb (120.2 kg)  06/10/24 255 lb (115.7 kg)   36.79 kg/m  Vitals:   07/02/24 1020  BP: 118/76  Pulse: 70  Temp: 98.3 F (36.8 C)  SpO2: 95%     Here for follow up of cellulitis of left arm   At last visit  Gave rocephin  IM 1g  Keflex  500 mg tid  Bactrim  Ds  Is improving  No fever   Arm is less warm  Much less painful  Draining yellow fluid   Using warm compress Also soaked in warm water with epsom salt -this really helped      Patient Active Problem List   Diagnosis Date Noted   Cellulitis of left arm 06/29/2024   Motion sickness 04/10/2024   Branch retinal artery occlusion 01/15/2024   Hepatic steatosis 11/08/2023   Callus of foot 10/23/2023   Angina pectoris (HCC) 09/26/2023   Morbid obesity (HCC) 04/14/2021   H/O total hip arthroplasty 03/30/2020   Status post total replacement of left hip 06/03/2019   Thrombocytopenia (HCC) 12/31/2018   Left hip pain 12/15/2018   Type 2 diabetes mellitus with hyperglycemia, without long-term current use of insulin  (HCC) 07/22/2017   Elevated transaminase level 04/16/2016   Coronary artery disease involving native coronary artery of native heart without angina pectoris 03/20/2016   History of non-ST elevation myocardial infarction (NSTEMI) 03/20/2016   Hyperlipidemia associated with type 2 diabetes mellitus (HCC) 03/12/2016   NSTEMI (non-ST elevated myocardial infarction) (HCC) 03/10/2016   Prostate cancer screening 02/10/2016   H/O: CVA (cerebrovascular accident) 03/01/2015   Right knee pain 04/06/2014   BPH (benign prostatic hyperplasia) 11/16/2013   Routine general medical examination at a health care facility 11/12/2013   B12 deficiency 09/29/2007   ERECTILE DYSFUNCTION 07/01/2007   History of tobacco abuse 07/01/2007   LOW  BACK PAIN, CHRONIC 07/01/2007   Primary hypertension 06/03/2007   Past Medical History:  Diagnosis Date   Arthritis    CAD (coronary artery disease)    a. NSTEMI: 100% stenosis of the distal RCA --> DES placed 5/17 CLEARED BY CARDIOLOGIST   Chronic low back pain    a. s/p back surgery.   CVA (cerebral infarction)    a. 02/2015 dyarthria 2/2 CVA involving the lateral aspect of the precentral gyrus.   DM2 (diabetes mellitus, type 2) (HCC)    a. pre-diabetic in the past. b. A1c 03/2016 elevated to 7.9.   ETOH abuse    GERD (gastroesophageal reflux disease)    History of echocardiogram    a. Mild LVH, EF 55-60%, Definity  contrast used-LV wall motion could not be adequately assessed   History of non-ST elevation myocardial infarction (NSTEMI) 03/2016   a. PCI: 3.5 x 24 mm Promus Premier DES to distal RCA   History of tobacco abuse    Hypertension    a. 02/2015 echo: 55-65%, trace TR/MR. b. 03/2016: echo with EF of 55-60%.    Myocardial infarction [facility])    Obesity    Ruptured disk    Stroke [facility])    2016   Past Surgical History:  Procedure Laterality Date   BACK SURGERY  2000   ruptured disk, L-S   CARDIAC CATHETERIZATION  05/2004   minimal CAD   CARDIAC CATHETERIZATION N/A 03/10/2016  Procedure: Left Heart Cath and Coronary Angiography;  Surgeon: Lonni JONETTA Cash, MD;  Location: [facility] INVASIVE CV LAB;  Service: Cardiovascular;  Laterality: N/A;   CARDIAC CATHETERIZATION N/A 03/10/2016   Procedure: Coronary Stent Intervention;  Surgeon: Lonni JONETTA Cash, MD;  Location: [facility] INVASIVE CV LAB;  Service: Cardiovascular;  Laterality: N/A;   CATARACT EXTRACTION W/PHACO Left 07/12/2016   Procedure: CATARACT EXTRACTION PHACO AND INTRAOCULAR LENS PLACEMENT (IOC);  Surgeon: Adine Oneil Novak, MD;  Location: [facility] ORS;  Service: Ophthalmology;  Laterality: Left;  US   00:50 AP% 9.2 CDE 4.63 fluid pack lot # [id number]   CATARACT EXTRACTION W/PHACO Right 10/23/2021   Procedure: CATARACT  EXTRACTION PHACO AND INTRAOCULAR LENS PLACEMENT (IOC) RIGHT DIABETIC;  Surgeon: Novak Adine Oneil, MD;  Location: [facility] SURGERY CNTR;  Service: Ophthalmology;  Laterality: Right;  Diabetic 1.15 00:16.4   CORONARY ANGIOPLASTY     STENT 5/17   TOTAL HIP ARTHROPLASTY Left 06/03/2019   Procedure: TOTAL HIP ARTHROPLASTY;  Surgeon: Mardee Lynwood SQUIBB, MD;  Location: [facility] ORS;  Service: Orthopedics;  Laterality: Left;   TOTAL HIP ARTHROPLASTY Right 03/30/2020   Procedure: TOTAL HIP ARTHROPLASTY;  Surgeon: Mardee Lynwood SQUIBB, MD;  Location: [facility] ORS;  Service: Orthopedics;  Laterality: Right;  posterior    Social History   Tobacco Use   Smoking status: Former    Current packs/day: 0.00    Types: Cigarettes    Quit date: 01/18/2015    Years since quitting: 9.4   Smokeless tobacco: Never  Vaping Use   Vaping status: Never Used  Substance Use Topics   Alcohol use: Yes    Alcohol/week: 12.0 standard drinks of alcohol    Types: 12 Cans of beer per week    Comment: weekends   Drug use: No   Family History  Problem Relation Age of Onset   Dementia Mother    Hypertension Father    Heart disease Father        MI   Cancer Maternal Grandmother        lung   Heart failure Paternal Grandfather    Allergies  Allergen Reactions   Ace Inhibitors Cough   Current Outpatient Medications on File Prior to Visit  Medication Sig Dispense Refill   amLODipine  (NORVASC ) 5 MG tablet Take 1 tablet (5 mg total) by mouth daily. 90 tablet 3   aspirin  EC 81 MG tablet Take 81 mg by mouth daily. Swallow whole.     celecoxib  (CELEBREX ) 200 MG capsule Take 200 mg by mouth daily.     cephALEXin  (KEFLEX ) 500 MG capsule Take 1 capsule (500 mg total) by mouth 3 (three) times daily. 30 capsule 0   cyanocobalamin  (VITAMIN B12) 1000 MCG tablet Take 1,000 mcg by mouth daily.     Evolocumab  (REPATHA  SURECLICK) 140 MG/ML SOAJ Inject 140 mg into the skin every 14 (fourteen) days. 6 mL 1   glipiZIDE  (GLUCOTROL  XL) 2.5 MG 24 hr  tablet TAKE 1 TABLET BY MOUTH EVERY DAY WITH BREAKFAST 90 tablet 1   hydrochlorothiazide  (HYDRODIURIL ) 25 MG tablet Take 0.5 tablets (12.5 mg total) by mouth daily. 45 tablet 3   metFORMIN  (GLUCOPHAGE ) 1000 MG tablet Take 1 tablet (1,000 mg total) by mouth 2 (two) times daily with a meal. 180 tablet 3   nitroGLYCERIN  (NITROSTAT ) 0.4 MG SL tablet Place 1 tablet (0.4 mg total) under the tongue every 5 (five) minutes as needed for chest pain (CP or SOB). 25 tablet 3   sildenafil  (VIAGRA ) 100 MG tablet TAKE  0.5-1 TABLETS BY MOUTH DAILY AS NEEDED FOR ERECTILE DYSFUNCTION. 5 tablet 5   sulfamethoxazole -trimethoprim  (BACTRIM  DS) 800-160 MG tablet Take 1 tablet by mouth 2 (two) times daily. 20 tablet 0   tirzepatide  (MOUNJARO ) 10 MG/0.5ML Pen Inject 10 mg into the skin once a week. 2 mL 3   valsartan  (DIOVAN ) 80 MG tablet Take 1 tablet (80 mg total) by mouth daily. 90 tablet 3   No current facility-administered medications on file prior to visit.    Review of Systems  Constitutional:  Negative for fatigue and fever.  Skin:        Infection left forearm -improving Draining now   Neurological:  Negative for tremors.       Objective:   Physical Exam Constitutional:      General: He is not in acute distress.    Appearance: Normal appearance. He is obese. He is not ill-appearing.  Cardiovascular:     Rate and Rhythm: Normal rate and regular rhythm.  Pulmonary:     Effort: Pulmonary effort is normal. No respiratory distress.  Skin:    Findings: Erythema present. No bruising.     Comments: Cellulitis /abscess area on left distal dorsal forearm is smaller and much softer  Some yellow drainage from several sites (able to express some with pressure)  Less tender  Significantly less erythema      Neurological:     Mental Status: He is alert.     Sensory: No sensory deficit.  Psychiatric:        Mood and Affect: Mood normal.           Assessment & Plan:   Problem List Items Addressed  This Visit       Other   Cellulitis of left arm - Primary   Significant improvement with  Keflex  500 mg tid  Bactrim  ds bid   Less red/swollen Now draining  Much less pressure and pain   Today cleaned and wrapped in soft guaze   Encouraged to finish antibiotics and update us  on Tuesday  Keep clean  Warm compress  Finish antibiotic  Call if anything worsens   Call back and Er precautions noted in detail today

## 2024-07-02 NOTE — Patient Instructions (Signed)
 Continue the antibiotics   Keep the arm clean with soap and water  Warm compresses or warm soak is ok  Continue to encourage drainage   Update us  Tuesday with how the am looks and feels   If symptoms worsen in the meantime let us  know

## 2024-07-03 ENCOUNTER — Encounter: Payer: Self-pay | Admitting: Gastroenterology

## 2024-07-03 ENCOUNTER — Other Ambulatory Visit (INDEPENDENT_AMBULATORY_CARE_PROVIDER_SITE_OTHER)

## 2024-07-03 ENCOUNTER — Ambulatory Visit (INDEPENDENT_AMBULATORY_CARE_PROVIDER_SITE_OTHER): Admitting: Gastroenterology

## 2024-07-03 VITALS — BP 126/70 | HR 74 | Ht 68.75 in | Wt 260.2 lb

## 2024-07-03 DIAGNOSIS — R7989 Other specified abnormal findings of blood chemistry: Secondary | ICD-10-CM

## 2024-07-03 DIAGNOSIS — K760 Fatty (change of) liver, not elsewhere classified: Secondary | ICD-10-CM | POA: Diagnosis not present

## 2024-07-03 DIAGNOSIS — D696 Thrombocytopenia, unspecified: Secondary | ICD-10-CM | POA: Diagnosis not present

## 2024-07-03 LAB — IBC + FERRITIN
Ferritin: 532 ng/mL — ABNORMAL HIGH (ref 22.0–322.0)
Iron: 138 ug/dL (ref 42–165)
Saturation Ratios: 32.7 % (ref 20.0–50.0)
TIBC: 421.4 ug/dL (ref 250.0–450.0)
Transferrin: 301 mg/dL (ref 212.0–360.0)

## 2024-07-03 NOTE — Patient Instructions (Signed)
 Fatty liver Maintain healthy weight, weight loss Mediterranean diet Activity/exercise as tolerated  Colon cancer screening Complete Cologuard  Your provider has requested that you go to the basement level for lab work before leaving today. Press B on the elevator. The lab is located at the first door on the left as you exit the elevator.   You have been scheduled for an abdominal ultrasound with elastography at [facility] (1st floor). Your appointment is scheduled for 07/10/24 at 10:00am. Please arrive 30 minutes prior to your scheduled appointment for registration purposes. Make certain not to have anything to eat or drink 6 hours prior to your procedure. Should you need to reschedule your appointment, you may contact radiology at [phone].  Liver Elastography Various chronic liver diseases such as hepatitis B, C, and fatty liver disease can lead to tissue damage and subsequent scar tissue formation. As the scar tissue accumulates, the liver loses some of its elasticity and becomes stiffer. Liver elastography involves the use of a surface ultrasound probe that delivers a low frequency pulse or shear wave to a small volume of liver tissue under the rib cage. The transmission of the sound wave is completely painless. How Is a Liver Elastography Performed? The liver is located in the right upper abdomen under the rib cage. Patients are asked to lie flat on an examination table. A technician places the FibroScan probe between the ribs on the right side of the lower chest wall. A series of 10 painless pulses are then applied to the liver. The results are recorded on the equipment and an overall liver stiffness score is generated. This score is then interpreted by a qualified physician to predict the likelihood of advanced fibrosis or cirrhosis.  Patients are asked to wear loose clothing and should not consume any liquids or solids for a minimum of 4 hours before the test to increase the  likelihood of obtaining reliable test results. The scan will take 10 to 15 minutes to complete, but patients should plan on being available for 30 minutes to allow time for preparation  _______________________________________________________  If your blood pressure at your visit was 140/90 or greater, please contact your primary care physician to follow up on this.  _______________________________________________________  If you are age 60 or older, your body mass index should be between 23-30. Your Body mass index is 38.71 kg/m. If this is out of the aforementioned range listed, please consider follow up with your Primary Care Provider.  If you are age 75 or younger, your body mass index should be between 19-25. Your Body mass index is 38.71 kg/m. If this is out of the aformentioned range listed, please consider follow up with your Primary Care Provider.   ________________________________________________________  The [facility] providers would like to encourage you to use MYCHART to communicate with providers for non-urgent requests or questions.  Due to long hold times on the telephone, sending your provider a message by [facility] may be a faster and more efficient way to get a response.  Please allow 48 business hours for a response.  Please remember that this is for non-urgent requests.  _______________________________________________________  [facility] is using a team-based approach to care.  Your team is made up of your doctor and two to three APPS. Our APPS (Nurse Practitioners and Physician Assistants) work with your physician to ensure care continuity for you. They are fully qualified to address your health concerns and develop a treatment plan. They communicate directly with your gastroenterologist to  care for you. Seeing the Advanced Practice Practitioners on your physician's team can help you by facilitating care more promptly, often allowing for earlier appointments,  access to diagnostic testing, procedures, and other specialty referrals.   Thank you for trusting me with your gastrointestinal care. Deanna May, FNP-C

## 2024-07-03 NOTE — Progress Notes (Signed)
 Chief Complaint: Elevated transaminase level , hepatic steatosis Primary GI Doctor: Dr. San  HPI:  [name] with past medical history of GERD, CAD status post NSTEMI 03/2016 with DES to RCA,hypertension, DM type 2, prior CVA, who was referred to me by Daniel Laine LABOR, MD on 02/09/24 for a evaluation of elevated transaminase level , hepatic steatosis.   1/31/ 25 seen by cardiology for routine follow-up. Reviewed entire note.  Interval History    Patient presents for evaluation for fatty liver. Patient reports he has been told he has had for fatty liver for past 10 years. No family history of liver diease or CA. No history of hepatitis.  He reports he has chronic hip pain since at least 2020. Reports he was taking OTC tylenol  3-4 tablet po daily, but has since then stopped.  Patient currently on antibiotics Bactrim  and Keflex  for skin infection on left arm.   Patient started on Mounjaro  for DM this year and lost 15lbs.    Patient drinks on weekends, 6 pack over the weekend. Former smoker, stopped 2016.   Patient denies GERD or dysphagia. Patient denies nausea, vomiting, or weight loss. Patient denies altered bowel habits, abdominal pain, rectal bleeding.   Patient on baby ASA 81 mg po daily.  Patient's family history : no colon CA or esophageal CA  Wt Readings from Last 3 Encounters:  07/03/24 260 lb 4 oz (118 kg)  07/02/24 256 lb 6 oz (116.3 kg)  06/29/24 265 lb (120.2 kg)     Past Medical History:  Diagnosis Date   Arthritis    CAD (coronary artery disease)    a. NSTEMI: 100% stenosis of the distal RCA --> DES placed 5/17 CLEARED BY CARDIOLOGIST   Chronic low back pain    a. s/p back surgery.   CVA (cerebral infarction)    a. 02/2015 dyarthria 2/2 CVA involving the lateral aspect of the precentral gyrus.   DM2 (diabetes mellitus, type 2) (HCC)    a. pre-diabetic in the past. b. A1c 03/2016 elevated to 7.9.   ETOH abuse    GERD  (gastroesophageal reflux disease)    History of echocardiogram    a. Mild LVH, EF 55-60%, Definity  contrast used-LV wall motion could not be adequately assessed   History of non-ST elevation myocardial infarction (NSTEMI) 03/2016   a. PCI: 3.5 x 24 mm Promus Premier DES to distal RCA   History of tobacco abuse    Hypertension    a. 02/2015 echo: 55-65%, trace TR/MR. b. 03/2016: echo with EF of 55-60%.    Myocardial infarction [facility])    Obesity    Ruptured disk    Stroke [facility])    2016    Past Surgical History:  Procedure Laterality Date   CARDIAC CATHETERIZATION  05/2004   minimal CAD   CARDIAC CATHETERIZATION N/A 03/10/2016   Procedure: Left Heart Cath and Coronary Angiography;  Surgeon: Lonni JONETTA Cash, MD;  Location: [facility] INVASIVE CV LAB;  Service: Cardiovascular;  Laterality: N/A;   CARDIAC CATHETERIZATION N/A 03/10/2016   Procedure: Coronary Stent Intervention;  Surgeon: Lonni JONETTA Cash, MD;  Location: [facility] INVASIVE CV LAB;  Service: Cardiovascular;  Laterality: N/A;   CATARACT EXTRACTION W/PHACO Left 07/12/2016   Procedure: CATARACT EXTRACTION PHACO AND INTRAOCULAR LENS PLACEMENT (IOC);  Surgeon: Adine Oneil Novak, MD;  Location: [facility] ORS;  Service: Ophthalmology;  Laterality: Left;  US   00:50 AP% 9.2 CDE 4.63 fluid pack lot # [id number]  CATARACT EXTRACTION W/PHACO Right 10/23/2021   Procedure: CATARACT EXTRACTION PHACO AND INTRAOCULAR LENS PLACEMENT (IOC) RIGHT DIABETIC;  Surgeon: Myrna Adine Anes, MD;  Location: [facility];  Service: Ophthalmology;  Laterality: Right;  Diabetic 1.15 00:16.4   CORONARY ANGIOPLASTY     STENT 5/17   LUMBAR FUSION  2000   ruptured disk, L5   SHOULDER ARTHROSCOPY WITH ROTATOR CUFF REPAIR Left    TOTAL HIP ARTHROPLASTY Left 06/03/2019   Procedure: TOTAL HIP ARTHROPLASTY;  Surgeon: Mardee Lynwood SQUIBB, MD;  Location: [facility] ORS;  Service: Orthopedics;  Laterality: Left;   TOTAL HIP ARTHROPLASTY Right 03/30/2020   Procedure: TOTAL  HIP ARTHROPLASTY;  Surgeon: Mardee Lynwood SQUIBB, MD;  Location: [facility] ORS;  Service: Orthopedics;  Laterality: Right;  posterior     Current Outpatient Medications  Medication Sig Dispense Refill   amLODipine  (NORVASC ) 5 MG tablet Take 1 tablet (5 mg total) by mouth daily. 90 tablet 3   aspirin  EC 81 MG tablet Take 81 mg by mouth daily. Swallow whole.     celecoxib  (CELEBREX ) 200 MG capsule Take 200 mg by mouth daily.     cephALEXin  (KEFLEX ) 500 MG capsule Take 1 capsule (500 mg total) by mouth 3 (three) times daily. 30 capsule 0   cyanocobalamin  (VITAMIN B12) 1000 MCG tablet Take 1,000 mcg by mouth daily.     Evolocumab  (REPATHA  SURECLICK) 140 MG/ML SOAJ Inject 140 mg into the skin every 14 (fourteen) days. 6 mL 1   glipiZIDE  (GLUCOTROL  XL) 2.5 MG 24 hr tablet TAKE 1 TABLET BY MOUTH EVERY DAY WITH BREAKFAST 90 tablet 1   hydrochlorothiazide  (HYDRODIURIL ) 25 MG tablet Take 0.5 tablets (12.5 mg total) by mouth daily. 45 tablet 3   metFORMIN  (GLUCOPHAGE ) 1000 MG tablet Take 1 tablet (1,000 mg total) by mouth 2 (two) times daily with a meal. 180 tablet 3   nitroGLYCERIN  (NITROSTAT ) 0.4 MG SL tablet Place 1 tablet (0.4 mg total) under the tongue every 5 (five) minutes as needed for chest pain (CP or SOB). 25 tablet 3   sildenafil  (VIAGRA ) 100 MG tablet TAKE 0.5-1 TABLETS BY MOUTH DAILY AS NEEDED FOR ERECTILE DYSFUNCTION. 5 tablet 5   sulfamethoxazole -trimethoprim  (BACTRIM  DS) 800-160 MG tablet Take 1 tablet by mouth 2 (two) times daily. 20 tablet 0   tirzepatide  (MOUNJARO ) 10 MG/0.5ML Pen Inject 10 mg into the skin once a week. 2 mL 3   valsartan  (DIOVAN ) 80 MG tablet Take 1 tablet (80 mg total) by mouth daily. 90 tablet 3   No current facility-administered medications for this visit.    Allergies as of 07/03/2024 - Review Complete 07/03/2024  Allergen Reaction Noted   Ace inhibitors Cough 07/01/2007    Family History  Problem Relation Age of Onset   Dementia Mother    Hypertension Father     Heart disease Father        MI   Heart attack Father    Lung cancer Maternal Grandmother    Heart failure Paternal Grandfather    Review of Systems:    Constitutional: No weight loss, fever, chills, weakness or fatigue HEENT: Eyes: No change in vision               Ears, Nose, Throat:  No change in hearing or congestion Skin: No rash or itching Cardiovascular: No chest pain, chest pressure or palpitations   Respiratory: No SOB or cough Gastrointestinal: See HPI and otherwise negative Genitourinary: No dysuria or change in urinary frequency Neurological: No headache, dizziness  or syncope Musculoskeletal: No new muscle or joint pain Hematologic: No bleeding or bruising Psychiatric: No history of depression or anxiety   Physical Exam:  Vital signs: BP 126/70 (BP Location: Left Arm, Patient Position: Sitting, Cuff Size: Large)   Pulse 74   Ht 5' 8.75 (1.746 m) Comment: height measured without shoes  Wt 260 lb 4 oz (118 kg)   BMI 38.71 kg/m   Constitutional: Pleasant male appears to be in NAD, Well developed, Well nourished, alert and cooperative Throat: Oral cavity and pharynx without inflammation, swelling or lesion.  Respiratory: Respirations even and unlabored. Lungs clear to auscultation bilaterally.   No wheezes, crackles, or rhonchi.  Cardiovascular: Normal S1, S2. Regular rate and rhythm. No peripheral edema, cyanosis or pallor.  Gastrointestinal:  Soft, nondistended, nontender. Obese. No rebound or guarding. Normal bowel sounds.  Rectal:  Not performed.  Msk:  Symmetrical without gross deformities. Without edema, no deformity or joint abnormality.  Neurologic:  Alert and  oriented x4;  grossly normal neurologically.  Skin:   Dry and intact without significant lesions or rashes.  RELEVANT LABS AND IMAGING: CBC    Latest Ref Rng & Units 04/10/2024   10:22 AM 02/07/2024    8:59 AM 10/23/2023   10:36 AM  CBC  WBC 4.0 - 10.5 K/uL 5.3  5.2  4.6   Hemoglobin 13.0 - 17.0 g/dL  84.8  84.2  84.5   Hematocrit 39.0 - 52.0 % 43.8  46.8  45.6   Platelets 150.0 - 400.0 K/uL 85.0  87.0  87.0      CMP     Latest Ref Rng & Units 04/10/2024   10:22 AM 02/07/2024    8:59 AM 11/08/2023    9:49 AM  CMP  Glucose 70 - 99 mg/dL 862     BUN 6 - 23 mg/dL 17     Creatinine 9.59 - 1.50 mg/dL 9.00     Sodium 864 - 854 mEq/L 136     Potassium 3.5 - 5.1 mEq/L 4.7     Chloride 96 - 112 mEq/L 101     CO2 19 - 32 mEq/L 30     Calcium  8.4 - 10.5 mg/dL 9.5     Total Protein 6.0 - 8.3 g/dL 7.0  7.5  7.7   Total Bilirubin 0.2 - 1.2 mg/dL 0.6  0.5  0.6   Alkaline Phos 39 - 117 U/L 142  145  167   AST 0 - 37 U/L 69  89  83   ALT 0 - 53 U/L 98  133  118      Lab Results  Component Value Date   TSH 2.45 10/23/2023    Lab Results  Component Value Date   INR 1.0 02/07/2024   INR 1.0 03/24/2020   INR 0.9 05/21/2019  01/16/24 CRP <1, ESR 38  02/2024 echo- Left ventricular ejection fraction, by estimation, is 55 to 60%.   Imaging: 10/2023 US  abd complete IMPRESSION: 1. Increased hepatic parenchymal echogenicity suggestive of steatosis. 2. No cholelithiasis or sonographic evidence for acute cholecystitis.   Fibrosis 4 Score = 4.92 (High risk)        Interpretation for patients with NAFLD          <1.30       -  F0-F1 (Low risk)          1.30-2.67 -  Indeterminate           >2.67      -  F3-F4 (  High risk)      Validated for ages 68-65        Assessment: Encounter Diagnoses  Name Primary?   Hepatic steatosis Yes   Elevated LFTs    Thrombocytopenia (HCC)       58 year old male patient who presents for evaluation of hepatic steatosis and elevated liver enzymes.  Patient also has Chronic Thrombocytopenia (noted as early as 2010).  Will go ahead and do full liver lab workup to rule out autoimmune, genetic, and/or viral hepatitis etiology.  Considered higher risk fib 4 score will proceed with liver elastography to stage level of disease.  No anemia.  Hemoglobin 15.1.  Total bilirubin  normal.  Consider upper GI endoscopy surveillance screening pending results of imaging.  Patient also has never had colon screening colonoscopy, recommended patient schedule today.  Patient opted out of colonoscopy and agrees to complete Cologuard.  Educated patient on Mediterranean diet, maintaining healthy weight, physical activity as tolerated, and alcohol cessation.  Plan: - Recommend alcohol cessation -Order liver elastography - Order ANA, AMA, Anti-smooth muscle antibody, Hepatitis A IgG, Hepatitis B surface antigen, Hepatitis B surface antibody,  HCV antibody, ferritin, TIBC,  Alpha 1 antitrypsin, ceruloplasmin, tTG, total IgA, PT/INR, IgG - Order Cologuard, declined colonoscopy  Thank you for the courtesy of this consult. Please call me with any questions or concerns.   Temeka Pore, FNP-C [facility] 07/03/2024, 9:51 AM  Cc: Tower, Laine LABOR, MD

## 2024-07-07 ENCOUNTER — Ambulatory Visit: Payer: Self-pay

## 2024-07-07 ENCOUNTER — Other Ambulatory Visit: Payer: Self-pay | Admitting: Family Medicine

## 2024-07-07 MED ORDER — SULFAMETHOXAZOLE-TRIMETHOPRIM 800-160 MG PO TABS: 1.0000 | ORAL_TABLET | Freq: Two times a day (BID) | ORAL | 0 refills | Status: DC

## 2024-07-07 MED ORDER — CEPHALEXIN 500 MG PO CAPS: 500.0000 mg | ORAL_CAPSULE | Freq: Three times a day (TID) | ORAL | 0 refills | Status: DC

## 2024-07-07 NOTE — Telephone Encounter (Signed)
 Left VM requesting pt to call the office back

## 2024-07-07 NOTE — Telephone Encounter (Signed)
 I sent in 7 more days of keflex  and bactrim  ds   If better after 5 days can stop them Continue to keep area clean and use warm compress  Follow up in office if this does not continue to improve

## 2024-07-07 NOTE — Telephone Encounter (Signed)
  FYI Only or Action Required?: Action required by provider: update on patient condition. Infection is getting better. Still has some pus. ABX are finished tomorrow. Pt is wondering if additional abx should be prescribed. Pt would like a call back on cell phone - ok to leave a detailed message. Pt does not have cell service today d/t work.  Patient was last seen in primary care on 07/02/2024 by Randeen Laine LABOR, MD.  Called Nurse Triage reporting Recurrent Skin Infections.  Symptoms began a week ago.  Interventions attempted: Prescription medications: Taking ABX as prescribed.  Symptoms are: gradually improving.  Triage Disposition: See PCP When Office is Open (Within 3 Days)                 Patient/caregiver understands and will follow disposition?: YesCopied from CRM #[id number]. Topic: Clinical - Red Word Triage >> Jul 07, 2024  8:38 AM Tiffini S wrote: [facility] that prompted transfer to Nurse Triage: Patient has a boil on the left wrist that looks better but is still infected- take the last of his antibiotics tomorrow and have concerns about the area. Was told to call back today if not cleared up. Reason for Disposition  [1] Finished taking antibiotic AND [2] wound infection symptoms are BETTER BUT [3] not completely gone  Answer Assessment - Initial Assessment Questions 1. SYMPTOM: What's the main symptom you're concerned about? (e.g., redness, swelling, pain, fever, weakness)     Boil on wrist 2. WOUND INFECTION LOCATION: Where is the wound infection located? (e.g., arm, face, foot, knee, leg)     wrist 3. WOUND INFECTION SIZE: What is the size of the red area? (e.g., inches, centimeters; compare to size of a coin)      smaller 4. BETTER-SAME-WORSE: Are you getting better, staying the same, or getting worse compared to the day you started the antibiotics?      Better - but still has pus 5. PAIN: Do you have any pain?  If Yes, ask: How bad is the pain?  (e.g.,  Scale 1-10; mild, moderate, or severe)     no 6. FEVER: Do you have a fever? If Yes, ask: What is it, how was it measured and when did it start?     no 7. OTHER SYMPTOMS: Do you have any other symptoms? (e.g., pus coming from a wound, red streaks, weakness)     Pus - getting better 8. DIAGNOSIS DATE: When was the wound infection diagnosed? By whom?      Dr. Randeen 9. ANTIBIOTIC NAME: What antibiotic(s) are you taking?  How many times a day? Note: Be sure the patient is taking the antibiotic as directed.      Cephalexin  and bactrim  10. ANTIBIOTIC DATE: When was the antibiotic started?       06/29/2024 11. FOLLOW-UP APPOINTMENT: Do you have a follow-up appointment with your doctor?       no  Protocols used: Wound Infection on Antibiotic Follow-up Call-A-AH

## 2024-07-07 NOTE — Telephone Encounter (Unsigned)
 Copied from CRM [id number]. Topic: General - Other >> Jul 07, 2024  1:23 PM Burnard DEL wrote: Reason for CRM: Patient returned call from [facility] I relayed message from provider in regards to more medication being sent to pharmacy for him. Patient verbalized understanding.

## 2024-07-09 LAB — TISSUE TRANSGLUTAMINASE ABS,IGG,IGA
(tTG) Ab, IgA: <1 U/mL
(tTG) Ab, IgG: <1 U/mL

## 2024-07-09 LAB — ANTI-NUCLEAR AB-TITER (ANA TITER)
ANA TITER: 1:640 {titer} — ABNORMAL HIGH
ANA TITER: 1:80 {titer} — ABNORMAL HIGH
ANA Titer 1: 1:80 {titer} — ABNORMAL HIGH

## 2024-07-09 LAB — ALPHA-1-ANTITRYPSIN: A-1 Antitrypsin, Ser: 182 mg/dL (ref 83–199)

## 2024-07-09 LAB — ANTI-SMOOTH MUSCLE ANTIBODY, IGG: Actin (Smooth Muscle) Antibody (IGG): <20 U (ref <20)

## 2024-07-09 LAB — HEPATITIS A ANTIBODY, TOTAL: Hepatitis A AB,Total: REACTIVE — AB

## 2024-07-09 LAB — ANA: Anti Nuclear Antibody (ANA): POSITIVE — AB

## 2024-07-09 LAB — MITOCHONDRIAL ANTIBODIES: Mitochondrial M2 Ab, IgG: 23.7 U — ABNORMAL HIGH (ref <=20.0)

## 2024-07-09 LAB — CERULOPLASMIN: Ceruloplasmin: 35 mg/dL — ABNORMAL HIGH (ref 14–30)

## 2024-07-09 LAB — HEPATITIS B SURFACE ANTIGEN: Hepatitis B Surface Ag: NONREACTIVE

## 2024-07-09 LAB — HEPATITIS B SURFACE ANTIBODY,QUALITATIVE: Hep B S Ab: NONREACTIVE

## 2024-07-09 LAB — IGA: Immunoglobulin A: 284 mg/dL (ref 47–310)

## 2024-07-09 LAB — HEPATITIS C ANTIBODY: Hepatitis C Ab: NONREACTIVE

## 2024-07-10 ENCOUNTER — Ambulatory Visit (HOSPITAL_COMMUNITY)
Admission: RE | Admit: 2024-07-10 | Discharge: 2024-07-10 | Disposition: A | Discharge status: Home / Self Care | Source: Ambulatory Visit | Attending: Gastroenterology | Admitting: Gastroenterology

## 2024-07-10 DIAGNOSIS — K760 Fatty (change of) liver, not elsewhere classified: Secondary | ICD-10-CM | POA: Insufficient documentation

## 2024-07-10 DIAGNOSIS — D696 Thrombocytopenia, unspecified: Secondary | ICD-10-CM | POA: Insufficient documentation

## 2024-07-10 DIAGNOSIS — R7989 Other specified abnormal findings of blood chemistry: Secondary | ICD-10-CM | POA: Insufficient documentation

## 2024-07-14 ENCOUNTER — Ambulatory Visit: Payer: Self-pay | Admitting: Gastroenterology

## 2024-07-14 DIAGNOSIS — R7989 Other specified abnormal findings of blood chemistry: Secondary | ICD-10-CM

## 2024-07-14 DIAGNOSIS — K760 Fatty (change of) liver, not elsewhere classified: Secondary | ICD-10-CM

## 2024-07-14 DIAGNOSIS — R768 Other specified abnormal immunological findings in serum: Secondary | ICD-10-CM

## 2024-07-14 DIAGNOSIS — D696 Thrombocytopenia, unspecified: Secondary | ICD-10-CM

## 2024-07-16 ENCOUNTER — Telehealth: Payer: Self-pay | Admitting: Gastroenterology

## 2024-07-16 NOTE — Telephone Encounter (Signed)
 Attempted to call number on file to discuss lab work and liver imaging along with recommendations to proceed with upper GI endoscopy.  No answer left voicemail

## 2024-07-21 ENCOUNTER — Telehealth: Payer: Self-pay | Admitting: Gastroenterology

## 2024-07-21 NOTE — Telephone Encounter (Signed)
 Attempted to call patient on phone number provided to discuss results and Dr. Mariella recommendations, unable to get answer left voicemail

## 2024-07-21 NOTE — Telephone Encounter (Signed)
 Patient returning call. Please advise

## 2024-07-22 NOTE — Addendum Note (Signed)
 Addended by: Jaidy Cottam N on: 07/22/2024 12:42 PM   Modules accepted: Orders

## 2024-07-23 ENCOUNTER — Telehealth: Payer: Self-pay

## 2024-07-23 NOTE — Telephone Encounter (Signed)
  Patient Consent for Virtual Visit         [name] has provided verbal consent on 07/23/2024 for a virtual visit (video or telephone).   Appointment scheduled for 08/19/2024 @ 4:20pm. Med req and consent are complete. Call patient at [phone].  CONSENT FOR VIRTUAL VISIT FOR:  Daniel [name]  By participating in this virtual visit I agree to the following:  I hereby voluntarily request, consent and authorize [facility] and its employed or contracted physicians, physician assistants, nurse practitioners or other licensed health care professionals (the Practitioner), to provide me with telemedicine health care services (the "Services) as deemed necessary by the treating Practitioner. I acknowledge and consent to receive the Services by the Practitioner via telemedicine. I understand that the telemedicine visit will involve communicating with the Practitioner through live audiovisual communication technology and the disclosure of certain medical information by electronic transmission. I acknowledge that I have been given the opportunity to request an in-person assessment or other available alternative prior to the telemedicine visit and am voluntarily participating in the telemedicine visit.  I understand that I have the right to withhold or withdraw my consent to the use of telemedicine in the course of my care at any time, without affecting my right to future care or treatment, and that the Practitioner or I may terminate the telemedicine visit at any time. I understand that I have the right to inspect all information obtained and/or recorded in the course of the telemedicine visit and may receive copies of available information for a reasonable fee.  I understand that some of the potential risks of receiving the Services via telemedicine include:  Delay or interruption in medical evaluation due to technological equipment failure or disruption; Information transmitted may not  be sufficient (e.g. poor resolution of images) to allow for appropriate medical decision making by the Practitioner; and/or  In rare instances, security protocols could fail, causing a breach of personal health information.  Furthermore, I acknowledge that it is my responsibility to provide information about my medical history, conditions and care that is complete and accurate to the best of my ability. I acknowledge that Practitioner's advice, recommendations, and/or decision may be based on factors not within their control, such as incomplete or inaccurate data provided by me or distortions of diagnostic images or specimens that may result from electronic transmissions. I understand that the practice of medicine is not an exact science and that Practitioner makes no warranties or guarantees regarding treatment outcomes. I acknowledge that a copy of this consent can be made available to me via my patient portal [facility] MyChart), or I can request a printed copy by calling the office of [facility].    I understand that my insurance will be billed for this visit.   I have read or had this consent read to me. I understand the contents of this consent, which adequately explains the benefits and risks of the Services being provided via telemedicine.  I have been provided ample opportunity to ask questions regarding this consent and the Services and have had my questions answered to my satisfaction. I give my informed consent for the services to be provided through the use of telemedicine in my medical care

## 2024-07-23 NOTE — Telephone Encounter (Signed)
 Appointment scheduled for 08/19/2024 @ 4:20pm. Med req and consent are complete. Call patient at [phone].

## 2024-07-23 NOTE — Telephone Encounter (Signed)
   Pre-operative Risk Assessment    Patient Name: Daniel [name]  DOB: 05/30/1966 MRN: [id number]   Date of last office visit: 12/06/23 ORREN FABRY, PA-C Date of next office visit: NONE   Request for Surgical Clearance    Procedure:  US  LIVER BIOPSY  Date of Surgery:  Clearance TBD                                Surgeon:  NOT INDICATED Surgeon's Group or Practice Name:  [facility] Phone number:  [phone] Fax number:  [phone]   ATTN: MELISSA X   Type of Clearance Requested:   - Medical  - Pharmacy:  Hold Aspirin  5 DAYS   Type of Anesthesia:  MODERATE SEDATION   Additional requests/questions:    Signed, Lucie DELENA Ku   07/23/2024, 10:33 AM

## 2024-07-23 NOTE — Telephone Encounter (Signed)
 Tried contacting patient to schedule TELEVISIT no answer left a detailed vm to call back and schedule

## 2024-07-23 NOTE — Telephone Encounter (Signed)
   Name: Daniel [name]  DOB: 24-Oct-1966  MRN: [id number]  Primary Cardiologist: Lonni Cash, MD  Chart reviewed as part of pre-operative protocol coverage. Because of Daniel [name]'s past medical history and time since last visit, he will require a follow-up telephone visit in order to better assess preoperative cardiovascular risk.  Pre-op covering staff: - Please schedule appointment and call patient to inform them. If patient already had an upcoming appointment within acceptable timeframe, please add pre-op clearance to the appointment notes so provider is aware. - Please contact requesting surgeon's office via preferred method (i.e, phone, fax) to inform them of need for appointment prior to surgery.  Remote history of DES to RCA back in 2017.  Can hold aspirin  if asymptomatic at the time of telephone visit for 5 to 7 days prior to surgery.  Please resume when medically safe to do so.  Daniel LOISE Fabry, PA-C  07/23/2024, 10:50 AM

## 2024-07-27 NOTE — Telephone Encounter (Signed)
 Patient returning call, requesting MyChart message since he has complicated work conditions.

## 2024-07-28 ENCOUNTER — Other Ambulatory Visit (HOSPITAL_BASED_OUTPATIENT_CLINIC_OR_DEPARTMENT_OTHER): Payer: Self-pay

## 2024-07-28 ENCOUNTER — Other Ambulatory Visit: Payer: Self-pay

## 2024-07-28 ENCOUNTER — Other Ambulatory Visit (HOSPITAL_COMMUNITY): Payer: Self-pay

## 2024-07-28 ENCOUNTER — Encounter: Payer: Self-pay | Admitting: Pharmacist

## 2024-07-29 ENCOUNTER — Telehealth: Payer: Self-pay

## 2024-07-29 NOTE — Telephone Encounter (Signed)
-----   Message from Nurse [place] B sent at 07/22/2024 12:42 PM EDT ----- Regarding: labs and liver bx appt Check for lab results and liver bx appt

## 2024-07-29 NOTE — Telephone Encounter (Signed)
 Liver bx scheduled for 08/28/24. MyChart message reminding patient to come in for additional labs.

## 2024-08-02 LAB — COLOGUARD: COLOGUARD: POSITIVE — AB

## 2024-08-08 ENCOUNTER — Other Ambulatory Visit: Payer: Self-pay | Admitting: Family Medicine

## 2024-08-10 ENCOUNTER — Other Ambulatory Visit: Payer: Self-pay | Admitting: Family Medicine

## 2024-08-10 NOTE — Telephone Encounter (Unsigned)
 Copied from CRM [id number]. Topic: Clinical - Medication Refill >> Aug 10, 2024  9:22 AM Precious C wrote: Medication: sildenafil  (VIAGRA ) 100 MG tablet  Has the patient contacted their pharmacy? Yes (Agent: If no, request that the patient contact the pharmacy for the refill. If patient does not wish to contact the pharmacy document the reason why and proceed with request.) (Agent: If yes, when and what did the pharmacy advise?)  This is the patient's preferred pharmacy:    [facility] [id number] [place], [place] - [address] [place] [address] Phone: [phone] Fax: [phone]  Is this the correct pharmacy for this prescription? Yes If no, delete pharmacy and type the correct one.   Has the prescription been filled recently? No  Is the patient out of the medication? Yes  Has the patient been seen for an appointment in the last year OR does the patient have an upcoming appointment? Yes  Can we respond through MyChart? Yes  Agent: Please be advised that Rx refills may take up to 3 business days. We ask that you follow-up with your pharmacy.

## 2024-08-10 NOTE — Telephone Encounter (Unsigned)
 Copied from CRM [id number]. Topic: Clinical - Medication Refill >> Aug 10, 2024  9:19 AM Daniel [name] wrote: Medication: tirzepatide  (MOUNJARO ) 10 MG/0.5ML Pen   Has the patient contacted their pharmacy? Yes advised to call (Agent: If no, request that the patient contact the pharmacy for the refill. If patient does not wish to contact the pharmacy document the reason why and proceed with request.) (Agent: If yes, when and what did the pharmacy advise?)  This is the patient's preferred pharmacy:  [facility] [phone] [facility] [place] - [address] [place] [address] Phone: [phone] Fax: [phone]    Is this the correct pharmacy for this prescription? Yes If no, delete pharmacy and type the correct one.   Has the prescription been filled recently? No  Is the patient out of the medication? Yes  Has the patient been seen for an appointment in the last year OR does the patient have an upcoming appointment? Yes  Can we respond through MyChart? Yes  Agent: Please be advised that Rx refills may take up to 3 business days. We ask that you follow-up with your pharmacy.

## 2024-08-10 NOTE — Telephone Encounter (Signed)
 See note from pharmacy saying PA is required

## 2024-08-11 ENCOUNTER — Telehealth: Payer: Self-pay | Admitting: Gastroenterology

## 2024-08-11 ENCOUNTER — Other Ambulatory Visit (HOSPITAL_COMMUNITY): Payer: Self-pay

## 2024-08-11 ENCOUNTER — Other Ambulatory Visit: Payer: Self-pay | Admitting: Family Medicine

## 2024-08-11 ENCOUNTER — Telehealth: Payer: Self-pay

## 2024-08-11 MED ORDER — TIRZEPATIDE 12.5 MG/0.5ML ~~LOC~~ SOAJ: 12.5000 mg | SUBCUTANEOUS | 0 refills | Status: DC

## 2024-08-11 NOTE — Telephone Encounter (Signed)
 Will route to PA dpt so they are aware

## 2024-08-11 NOTE — Telephone Encounter (Signed)
Rx was refilled yesterday

## 2024-08-11 NOTE — Telephone Encounter (Signed)
 Patient returning call Requesting a call back  Please advise  Thank you

## 2024-08-11 NOTE — Telephone Encounter (Signed)
 PA was started today but PA dpt, please see pt's notes regarding increasing dose

## 2024-08-11 NOTE — Telephone Encounter (Unsigned)
 Copied from CRM #[id number]. Topic: Clinical - Medication Prior Auth >> Aug 11, 2024  8:44 AM Frederich PARAS wrote: Reason for CRM: pt calling to get an update on hi mounjaro . Per notes , a pre- shara is needed. Pt says if pcp wants to up the dose that's fine as well. Pt will be out by Friday of it. Pharmacy needs  a pre auth from pcp.

## 2024-08-11 NOTE — Telephone Encounter (Signed)
 I sent in the 12.5  Let us  know if any issues tolerating   The prior auth folks may need to start another one for this strength? Unsure  Thanks

## 2024-08-11 NOTE — Telephone Encounter (Signed)
 Attempted to call patient on phone number on file to discuss recommendations to proceed with both endoscopy and colonoscopy.  No answer.  Left voicemail

## 2024-08-11 NOTE — Telephone Encounter (Signed)
 Pharmacy Patient Advocate Encounter   Received notification from Physician's Office that prior authorization for Mounjaro  10MG /0.5ML auto-injectors is required/requested.   Insurance verification completed.   The patient is insured through [facility].   Per test claim: PA required; PA submitted to above mentioned insurance via Prompt PA Key/confirmation #/EOC [id number] Status is pending

## 2024-08-12 ENCOUNTER — Other Ambulatory Visit: Payer: Self-pay

## 2024-08-12 ENCOUNTER — Telehealth: Payer: Self-pay | Admitting: Gastroenterology

## 2024-08-12 ENCOUNTER — Other Ambulatory Visit (HOSPITAL_COMMUNITY): Payer: Self-pay

## 2024-08-12 DIAGNOSIS — R195 Other fecal abnormalities: Secondary | ICD-10-CM

## 2024-08-12 DIAGNOSIS — R7989 Other specified abnormal findings of blood chemistry: Secondary | ICD-10-CM

## 2024-08-12 DIAGNOSIS — K7460 Unspecified cirrhosis of liver: Secondary | ICD-10-CM

## 2024-08-12 DIAGNOSIS — K760 Fatty (change of) liver, not elsewhere classified: Secondary | ICD-10-CM

## 2024-08-12 MED ORDER — NA SULFATE-K SULFATE-MG SULF 17.5-3.13-1.6 GM/177ML PO SOLN: 1.0000 | Freq: Once | ORAL | 0 refills | Status: AC

## 2024-08-12 NOTE — Telephone Encounter (Signed)
 Patient called requesting to speak with Deanna. Please advise.

## 2024-08-12 NOTE — Telephone Encounter (Signed)
 Patient returning phone call. Please advise, thank you

## 2024-08-12 NOTE — Telephone Encounter (Signed)
 Spoke with patient about recommendations for egd/colon. He is already scheduled.

## 2024-08-12 NOTE — Telephone Encounter (Signed)
 Pharmacy Patient Advocate Encounter  Received notification from [employer] that Prior Authorization for Mounjaro  10MG /0.5ML auto-injectors has been APPROVED from 08/12/2024 to 08/12/2025. Ran test claim, Copay is $0. This test claim was processed through [facility]- copay amounts may vary at other pharmacies due to pharmacy/plan contracts, or as the patient moves through the different stages of their insurance plan.   PA #/Case ID/Reference #:  [id number]

## 2024-08-12 NOTE — Telephone Encounter (Signed)
 Pt had been  scheduled for an upcoming procedure prior. Pt was notified not to take his Mounjaro  7 days prior to his procedure.  Pt verbalized understanding with all questions answered.

## 2024-08-13 ENCOUNTER — Telehealth: Payer: Self-pay

## 2024-08-13 MED ORDER — SILDENAFIL CITRATE 100 MG PO TABS: ORAL_TABLET | ORAL | 5 refills | Status: AC

## 2024-08-13 NOTE — Addendum Note (Signed)
 Addended by: RAYLEEN GLENDORA HERO on: 08/13/2024 12:24 PM   Modules accepted: Orders

## 2024-08-13 NOTE — Telephone Encounter (Signed)
 Copied from CRM [id number]. Topic: Clinical - Prescription Issue >> Aug 12, 2024  4:50 PM Daniel [name] wrote: Reason for CRM: sildenafil  (VIAGRA ) 100 MG tablet- sent to wrong pharmacy, need sent to [facility]

## 2024-08-14 ENCOUNTER — Other Ambulatory Visit (INDEPENDENT_AMBULATORY_CARE_PROVIDER_SITE_OTHER)

## 2024-08-14 ENCOUNTER — Telehealth: Payer: Self-pay

## 2024-08-14 DIAGNOSIS — K760 Fatty (change of) liver, not elsewhere classified: Secondary | ICD-10-CM

## 2024-08-14 DIAGNOSIS — D696 Thrombocytopenia, unspecified: Secondary | ICD-10-CM

## 2024-08-14 DIAGNOSIS — R7689 Other specified abnormal immunological findings in serum: Secondary | ICD-10-CM

## 2024-08-14 DIAGNOSIS — R7989 Other specified abnormal findings of blood chemistry: Secondary | ICD-10-CM

## 2024-08-14 LAB — CBC WITH DIFFERENTIAL/PLATELET
Basophils Absolute: 0 K/uL (ref 0.0–0.1)
Basophils Relative: 0.9 % (ref 0.0–3.0)
Eosinophils Absolute: 0.4 K/uL (ref 0.0–0.7)
Eosinophils Relative: 7.5 % — ABNORMAL HIGH (ref 0.0–5.0)
HCT: 45.9 % (ref 39.0–52.0)
Hemoglobin: 15.4 g/dL (ref 13.0–17.0)
Lymphocytes Relative: 21.8 % (ref 12.0–46.0)
Lymphs Abs: 1.1 K/uL (ref 0.7–4.0)
MCHC: 33.5 g/dL (ref 30.0–36.0)
MCV: 95.7 fl (ref 78.0–100.0)
Monocytes Absolute: 0.5 K/uL (ref 0.1–1.0)
Monocytes Relative: 9.6 % (ref 3.0–12.0)
Neutro Abs: 3 K/uL (ref 1.4–7.7)
Neutrophils Relative %: 60.2 % (ref 43.0–77.0)
Platelets: 85 K/uL — ABNORMAL LOW (ref 150.0–400.0)
RBC: 4.8 Mil/uL (ref 4.22–5.81)
RDW: 13.4 % (ref 11.5–15.5)
WBC: 5 K/uL (ref 4.0–10.5)

## 2024-08-14 NOTE — Telephone Encounter (Signed)
 Patient comes in today for a work note d/t missing work to come to the lab for blood work.

## 2024-08-16 ENCOUNTER — Ambulatory Visit: Payer: Self-pay | Admitting: Family Medicine

## 2024-08-18 ENCOUNTER — Telehealth: Payer: Self-pay | Admitting: *Deleted

## 2024-08-18 LAB — ANTI-MICROSOMAL ANTIBODY LIVER / KIDNEY: LKM1 Ab: <=20 U (ref <=20.0)

## 2024-08-18 LAB — IGG: IgG (Immunoglobin G), Serum: 1151 mg/dL (ref 600–1640)

## 2024-08-18 NOTE — Telephone Encounter (Signed)
 Pt viewed results via mychart but per Dr. Randeen pt needs non fasting lab appt in 3 months, please scheduled

## 2024-08-19 ENCOUNTER — Ambulatory Visit: Attending: Cardiovascular Disease | Admitting: Emergency Medicine

## 2024-08-19 ENCOUNTER — Ambulatory Visit: Payer: Self-pay | Admitting: Gastroenterology

## 2024-08-19 DIAGNOSIS — Z01810 Encounter for preprocedural cardiovascular examination: Secondary | ICD-10-CM | POA: Diagnosis not present

## 2024-08-19 NOTE — Progress Notes (Signed)
 Virtual Visit via Telephone Note   Because of Daniel [name] co-morbid illnesses, he is at least at moderate risk for complications without adequate follow up.  This format is felt to be most appropriate for this patient at this time.  Due to technical limitations with video connection (technology), today's appointment will be conducted as an audio only telehealth visit, and [name] verbally agreed to proceed in this manner.   All issues noted in this document were discussed and addressed.  No physical exam could be performed with this format.  Evaluation Performed:  Preoperative cardiovascular risk assessment _____________   Date:  08/19/2024   Patient ID:  Daniel [name], DOB [birth year], MRN [id number] Patient Location:  Home Provider location:   Office  Primary Care Provider:  Randeen Laine LABOR, MD Primary Cardiologist:  Lonni Cash, MD  Chief Complaint / Patient Profile   58 y.o. y/o male with a h/o coronary artery disease s/p NSTEMI in 2017 with DES to RCA, hypertension, T2DM, prior CVA who is pending ultrasound liver biopsy on date TBD with [facility] health radiology department and presents today for telephonic preoperative cardiovascular risk assessment.  History of Present Illness    Daniel [name] is a 58 y.o. male who presents via audio/video conferencing for a telehealth visit today.  Pt was last seen in cardiology clinic on 12/06/2023 by [place]row Bush, PA.  At that time ANTONIA CULBERTSON was doing well.  The patient is now pending procedure as outlined above. Since his last visit, he denies chest pain, shortness of breath, lower extremity edema, fatigue, palpitations, melena, hematuria, hemoptysis, diaphoresis, weakness, presyncope, syncope, orthopnea, and PND.  Today patient is doing well without acute cardiovascular concerns.  Reports he continues to stay very active mainly at his work.  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  Does note to have some muscle aches since starting Repatha .  Overall no symptoms to suggest active angina.  He can complete greater than 4 METS.  Past Medical History    Past Medical History:  Diagnosis Date   Arthritis    CAD (coronary artery disease)    a. NSTEMI: 100% stenosis of the distal RCA --> DES placed 5/17 CLEARED BY CARDIOLOGIST   Chronic low back pain    a. s/p back surgery.   CVA (cerebral infarction)    a. 02/2015 dyarthria 2/2 CVA involving the lateral aspect of the precentral gyrus.   DM2 (diabetes mellitus, type 2) (HCC)    a. pre-diabetic in the past. b. A1c 03/2016 elevated to 7.9.   ETOH abuse    GERD (gastroesophageal reflux disease)    History of echocardiogram    a. Mild LVH, EF 55-60%, Definity  contrast used-LV wall motion could not be adequately assessed   History of non-ST elevation myocardial infarction (NSTEMI) 03/2016   a. PCI: 3.5 x 24 mm Promus Premier DES to distal RCA   History of tobacco abuse    Hypertension    a. 02/2015 echo: 55-65%, trace TR/MR. b. 03/2016: echo with EF of 55-60%.    Myocardial infarction [facility])    Obesity    Ruptured disk    Stroke [facility])    2016   Past Surgical History:  Procedure Laterality Date   CARDIAC CATHETERIZATION  05/2004   minimal CAD   CARDIAC CATHETERIZATION N/A 03/10/2016   Procedure: Left Heart Cath and Coronary Angiography;  Surgeon: Lonni JONETTA Cash,  MD;  Location: [facility] INVASIVE CV LAB;  Service: Cardiovascular;  Laterality: N/A;   CARDIAC CATHETERIZATION N/A 03/10/2016   Procedure: Coronary Stent Intervention;  Surgeon: Lonni JONETTA Cash, MD;  Location: [facility] INVASIVE CV LAB;  Service: Cardiovascular;  Laterality: N/A;   CATARACT EXTRACTION W/PHACO Left 07/12/2016   Procedure: CATARACT EXTRACTION PHACO AND INTRAOCULAR LENS PLACEMENT (IOC);  Surgeon: Adine Oneil Novak, MD;  Location: [facility] ORS;  Service: Ophthalmology;  Laterality: Left;  US   00:50 AP% 9.2 CDE  4.63 fluid pack lot # [id number]   CATARACT EXTRACTION W/PHACO Right 10/23/2021   Procedure: CATARACT EXTRACTION PHACO AND INTRAOCULAR LENS PLACEMENT (IOC) RIGHT DIABETIC;  Surgeon: Novak Adine Oneil, MD;  Location: [facility];  Service: Ophthalmology;  Laterality: Right;  Diabetic 1.15 00:16.4   CORONARY ANGIOPLASTY     STENT 5/17   LUMBAR FUSION  2000   ruptured disk, L5   SHOULDER ARTHROSCOPY WITH ROTATOR CUFF REPAIR Left    TOTAL HIP ARTHROPLASTY Left 06/03/2019   Procedure: TOTAL HIP ARTHROPLASTY;  Surgeon: Mardee Lynwood SQUIBB, MD;  Location: [facility] ORS;  Service: Orthopedics;  Laterality: Left;   TOTAL HIP ARTHROPLASTY Right 03/30/2020   Procedure: TOTAL HIP ARTHROPLASTY;  Surgeon: Mardee Lynwood SQUIBB, MD;  Location: [facility] ORS;  Service: Orthopedics;  Laterality: Right;  posterior     Allergies  Allergies  Allergen Reactions   Ace Inhibitors Cough    Home Medications    Prior to Admission medications   Medication Sig Start Date End Date Taking? Authorizing Provider  amLODipine  (NORVASC ) 5 MG tablet Take 1 tablet (5 mg total) by mouth daily. 10/23/23   Tower, Laine LABOR, MD  aspirin  EC 81 MG tablet Take 81 mg by mouth daily. Swallow whole.    [provider]  celecoxib  (CELEBREX ) 200 MG capsule Take 200 mg by mouth daily.    [provider]  cephALEXin  (KEFLEX ) 500 MG capsule Take 1 capsule (500 mg total) by mouth 3 (three) times daily. 07/07/24   Tower, Laine LABOR, MD  cyanocobalamin  (VITAMIN B12) 1000 MCG tablet Take 1,000 mcg by mouth daily.    [provider]  Evolocumab  (REPATHA  SURECLICK) 140 MG/ML SOAJ Inject 140 mg into the skin every 14 (fourteen) days. 06/08/24   Cash Lonni JONETTA, MD  glipiZIDE  (GLUCOTROL  XL) 2.5 MG 24 hr tablet TAKE 1 TABLET BY MOUTH EVERY DAY WITH BREAKFAST 04/24/24   Tower, Laine LABOR, MD  hydrochlorothiazide  (HYDRODIURIL ) 25 MG tablet Take 0.5 tablets (12.5 mg total) by mouth daily. 10/23/23   Tower, Laine LABOR, MD  metFORMIN   (GLUCOPHAGE ) 1000 MG tablet Take 1 tablet (1,000 mg total) by mouth 2 (two) times daily with a meal. 10/23/23   Tower, Laine LABOR, MD  nitroGLYCERIN  (NITROSTAT ) 0.4 MG SL tablet Place 1 tablet (0.4 mg total) under the tongue every 5 (five) minutes as needed for chest pain (CP or SOB). 03/12/16   Strader, Laymon HERO, PA-C  sildenafil  (VIAGRA ) 100 MG tablet TAKE 1/2 - 1 TABLET BY MOUTH DAILY AS NEEDED FOR ERECTILE DYSFUNCTION 08/13/24   Tower, Laine LABOR, MD  sulfamethoxazole -trimethoprim  (BACTRIM  DS) 800-160 MG tablet Take 1 tablet by mouth 2 (two) times daily. 07/07/24   Tower, Laine LABOR, MD  tirzepatide  (MOUNJARO ) 12.5 MG/0.5ML Pen Inject 12.5 mg into the skin once a week. 08/11/24   Tower, Laine LABOR, MD  valsartan  (DIOVAN ) 80 MG tablet Take 1 tablet (80 mg total) by mouth daily. 10/23/23   Tower, Laine LABOR, MD    Physical Exam  Vital Signs:  George Haggart List does not have vital signs available for review today.  Given telephonic nature of communication, physical exam is limited. AAOx3. NAD. Normal affect.  Speech and respirations are unlabored.  Accessory Clinical Findings    None  Assessment & Plan    1.  Preoperative Cardiovascular Risk Assessment: According to the Revised Cardiac Risk Index (RCRI), his Perioperative Risk of Major Cardiac Event is (%): 6.6. His Functional Capacity in METs is: 6.61 according to the Duke Activity Status Index (DASI).  Therefore, based on ACC/AHA guidelines, patient would be at acceptable risk for the planned procedure without further cardiovascular testing. I will route this recommendation to the requesting party via Epic fax function.  The patient was advised that if he develops new symptoms prior to surgery to contact our office to arrange for a follow-up visit, and he verbalized understanding.  He may hold Aspirin  for 5-7 days prior to procedure. Please resume Aspirin  as soon as possible postprocedure, at the discretion of the surgeon.    A copy of this note will  be routed to requesting surgeon.  Time:   Today, I have spent 8 minutes with the patient with telehealth technology discussing medical history, symptoms, and management plan.     Lum LITTIE Louis, NP  08/19/2024, 4:32 PM

## 2024-08-24 NOTE — Telephone Encounter (Signed)
 Called and schedule pt lvm

## 2024-08-24 NOTE — Telephone Encounter (Signed)
 Lvm for pt to call office and schedule appt.

## 2024-08-27 ENCOUNTER — Other Ambulatory Visit: Payer: Self-pay

## 2024-08-27 ENCOUNTER — Other Ambulatory Visit: Payer: Self-pay | Admitting: Radiology

## 2024-08-27 DIAGNOSIS — Z01812 Encounter for preprocedural laboratory examination: Secondary | ICD-10-CM

## 2024-08-28 ENCOUNTER — Other Ambulatory Visit: Payer: Self-pay

## 2024-08-28 ENCOUNTER — Ambulatory Visit (HOSPITAL_COMMUNITY)
Admission: RE | Admit: 2024-08-28 | Discharge: 2024-08-28 | Disposition: A | Discharge status: Home / Self Care | Source: Ambulatory Visit | Attending: Gastroenterology | Admitting: Gastroenterology

## 2024-08-28 DIAGNOSIS — Z7984 Long term (current) use of oral hypoglycemic drugs: Secondary | ICD-10-CM | POA: Insufficient documentation

## 2024-08-28 DIAGNOSIS — E119 Type 2 diabetes mellitus without complications: Secondary | ICD-10-CM | POA: Insufficient documentation

## 2024-08-28 DIAGNOSIS — R7689 Other specified abnormal immunological findings in serum: Secondary | ICD-10-CM | POA: Insufficient documentation

## 2024-08-28 DIAGNOSIS — R896 Abnormal cytological findings in specimens from other organs, systems and tissues: Secondary | ICD-10-CM | POA: Diagnosis not present

## 2024-08-28 DIAGNOSIS — Z01812 Encounter for preprocedural laboratory examination: Secondary | ICD-10-CM

## 2024-08-28 DIAGNOSIS — K219 Gastro-esophageal reflux disease without esophagitis: Secondary | ICD-10-CM | POA: Insufficient documentation

## 2024-08-28 DIAGNOSIS — D696 Thrombocytopenia, unspecified: Secondary | ICD-10-CM | POA: Diagnosis not present

## 2024-08-28 DIAGNOSIS — G8929 Other chronic pain: Secondary | ICD-10-CM | POA: Insufficient documentation

## 2024-08-28 DIAGNOSIS — I2510 Atherosclerotic heart disease of native coronary artery without angina pectoris: Secondary | ICD-10-CM | POA: Diagnosis not present

## 2024-08-28 DIAGNOSIS — I10 Essential (primary) hypertension: Secondary | ICD-10-CM | POA: Insufficient documentation

## 2024-08-28 DIAGNOSIS — R7989 Other specified abnormal findings of blood chemistry: Secondary | ICD-10-CM | POA: Diagnosis present

## 2024-08-28 DIAGNOSIS — M549 Dorsalgia, unspecified: Secondary | ICD-10-CM | POA: Diagnosis not present

## 2024-08-28 DIAGNOSIS — K760 Fatty (change of) liver, not elsewhere classified: Secondary | ICD-10-CM | POA: Insufficient documentation

## 2024-08-28 DIAGNOSIS — Z7985 Long-term (current) use of injectable non-insulin antidiabetic drugs: Secondary | ICD-10-CM | POA: Insufficient documentation

## 2024-08-28 LAB — CBC
HCT: 41.6 % (ref 39.0–52.0)
Hemoglobin: 14.4 g/dL (ref 13.0–17.0)
MCH: 32.4 pg (ref 26.0–34.0)
MCHC: 34.6 g/dL (ref 30.0–36.0)
MCV: 93.5 fL (ref 80.0–100.0)
Platelets: 73 K/uL — ABNORMAL LOW (ref 150–400)
RBC: 4.45 MIL/uL (ref 4.22–5.81)
RDW: 12.3 % (ref 11.5–15.5)
WBC: 4.9 K/uL (ref 4.0–10.5)
nRBC: 0 % (ref 0.0–0.2)

## 2024-08-28 LAB — PROTIME-INR
INR: 0.9 (ref 0.8–1.2)
Prothrombin Time: 13 s (ref 11.4–15.2)

## 2024-08-28 LAB — GLUCOSE, CAPILLARY
Glucose-Capillary: 139 mg/dL — ABNORMAL HIGH (ref 70–99)
Glucose-Capillary: 133 mg/dL — ABNORMAL HIGH (ref 70–99)

## 2024-08-28 MED ORDER — SODIUM CHLORIDE 0.9 % IV SOLN: INTRAVENOUS | Status: DC

## 2024-08-28 MED ORDER — FENTANYL CITRATE (PF) 100 MCG/2ML IJ SOLN
INTRAMUSCULAR | Status: AC | PRN
  Administered 2024-08-28: 50 ug via INTRAVENOUS

## 2024-08-28 MED ORDER — LIDOCAINE HCL (PF) 1 % IJ SOLN
10.0000 mL | Freq: Once | INTRAMUSCULAR | Status: AC
  Administered 2024-08-28: 10 mL via INTRADERMAL

## 2024-08-28 MED ORDER — FENTANYL CITRATE (PF) 100 MCG/2ML IJ SOLN
INTRAMUSCULAR | Status: AC
  Filled 2024-08-28: qty 2

## 2024-08-28 MED ORDER — MIDAZOLAM HCL (PF) 2 MG/2ML IJ SOLN
INTRAMUSCULAR | Status: AC | PRN
  Administered 2024-08-28: 2 mg via INTRAVENOUS

## 2024-08-28 MED ORDER — MIDAZOLAM HCL 2 MG/2ML IJ SOLN
INTRAMUSCULAR | Status: AC
  Filled 2024-08-28: qty 2

## 2024-08-28 NOTE — H&P (Signed)
 Chief Complaint: Elevated AMA-M2 antibodies, concern for possible autoimmune hepatitis - IR consulted for liver biopsy  Referring Provider(s): May, Deanna J, NP  Supervising Physician: Jenna Hacker  Patient Status: [facility] - Out-pt  History of Present Illness: Daniel [name] is a 58 y.o. male with hx of CAD, chronic back pain, EtOH abuse, HTN, GERD. Patient has followed with pcp for hepatic steatosis, which he reports have have had for 10 years. Was referred to GI on 07/03/24 due to elevating liver enzymes, where further labs were ordered to evaluate. 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   Today patient without complaint. Has been NPO since midnight. Has been holding ASA x5 days.    Patient is Full Code  Past Medical History:  Diagnosis Date   Arthritis    CAD (coronary artery disease)    a. NSTEMI: 100% stenosis of the distal RCA --> DES placed 5/17 CLEARED BY CARDIOLOGIST   Chronic low back pain    a. s/p back surgery.   CVA (cerebral infarction)    a. 02/2015 dyarthria 2/2 CVA involving the lateral aspect of the precentral gyrus.   DM2 (diabetes mellitus, type 2) (HCC)    a. pre-diabetic in the past. b. A1c 03/2016 elevated to 7.9.   ETOH abuse    GERD (gastroesophageal reflux disease)    History of echocardiogram    a. Mild LVH, EF 55-60%, Definity  contrast used-LV wall motion could not be adequately assessed   History of non-ST elevation myocardial infarction (NSTEMI) 03/2016   a. PCI: 3.5 x 24 mm Promus Premier DES to distal RCA   History of tobacco abuse    Hypertension    a. 02/2015 echo: 55-65%, trace TR/MR. b. 03/2016: echo with EF of 55-60%.    Myocardial infarction [facility])    Obesity    Ruptured disk    Stroke [facility])    2016    Past Surgical History:  Procedure Laterality Date   CARDIAC CATHETERIZATION  05/2004   minimal CAD    CARDIAC CATHETERIZATION N/A 03/10/2016   Procedure: Left Heart Cath and Coronary Angiography;  Surgeon: Lonni JONETTA Cash, MD;  Location: [facility] INVASIVE CV LAB;  Service: Cardiovascular;  Laterality: N/A;   CARDIAC CATHETERIZATION N/A 03/10/2016   Procedure: Coronary Stent Intervention;  Surgeon: Lonni JONETTA Cash, MD;  Location: [facility] INVASIVE CV LAB;  Service: Cardiovascular;  Laterality: N/A;   CATARACT EXTRACTION W/PHACO Left 07/12/2016   Procedure: CATARACT EXTRACTION PHACO AND INTRAOCULAR LENS PLACEMENT (IOC);  Surgeon: Adine Oneil Novak, MD;  Location: [facility] ORS;  Service: Ophthalmology;  Laterality: Left;  US   00:50 AP% 9.2 CDE 4.63 fluid pack lot # [id number]   CATARACT EXTRACTION W/PHACO Right 10/23/2021   Procedure: CATARACT EXTRACTION PHACO AND INTRAOCULAR LENS PLACEMENT (IOC) RIGHT DIABETIC;  Surgeon: Novak Adine Oneil, MD;  Location: [facility];  Service: Ophthalmology;  Laterality: Right;  Diabetic 1.15 00:16.4   CORONARY ANGIOPLASTY     STENT 5/17   LUMBAR FUSION  2000   ruptured disk, L5   SHOULDER ARTHROSCOPY WITH ROTATOR CUFF REPAIR Left    TOTAL HIP ARTHROPLASTY Left 06/03/2019   Procedure: TOTAL HIP ARTHROPLASTY;  Surgeon: Mardee Lynwood SQUIBB, MD;  Location: [facility] ORS;  Service: Orthopedics;  Laterality: Left;   TOTAL HIP ARTHROPLASTY Right 03/30/2020   Procedure: TOTAL HIP ARTHROPLASTY;  Surgeon: Mardee Lynwood SQUIBB, MD;  Location: [facility] ORS;  Service: Orthopedics;  Laterality: Right;  posterior     Allergies: Ace inhibitors  Medications: Prior to Admission medications   Medication Sig Start Date End Date Taking? Authorizing Provider  amLODipine  (NORVASC ) 5 MG tablet Take 1 tablet (5 mg total) by mouth daily. 10/23/23  Yes Tower, Laine LABOR, MD  celecoxib  (CELEBREX ) 200 MG capsule Take 200 mg by mouth daily.   Yes [provider]  cyanocobalamin  (VITAMIN B12) 1000 MCG tablet Take 1,000 mcg by mouth daily.   Yes [provider]  Evolocumab   (REPATHA  SURECLICK) 140 MG/ML SOAJ Inject 140 mg into the skin every 14 (fourteen) days. 06/08/24  Yes Verlin Lonni BIRCH, MD  glipiZIDE  (GLUCOTROL  XL) 2.5 MG 24 hr tablet TAKE 1 TABLET BY MOUTH EVERY DAY WITH BREAKFAST 04/24/24  Yes Tower, Laine LABOR, MD  hydrochlorothiazide  (HYDRODIURIL ) 25 MG tablet Take 0.5 tablets (12.5 mg total) by mouth daily. 10/23/23  Yes Tower, Laine LABOR, MD  metFORMIN  (GLUCOPHAGE ) 1000 MG tablet Take 1 tablet (1,000 mg total) by mouth 2 (two) times daily with a meal. 10/23/23  Yes Tower, Laine LABOR, MD  sildenafil  (VIAGRA ) 100 MG tablet TAKE 1/2 - 1 TABLET BY MOUTH DAILY AS NEEDED FOR ERECTILE DYSFUNCTION 08/13/24  Yes Tower, Laine LABOR, MD  valsartan  (DIOVAN ) 80 MG tablet Take 1 tablet (80 mg total) by mouth daily. 10/23/23  Yes Tower, Laine LABOR, MD  aspirin  EC 81 MG tablet Take 81 mg by mouth daily. Swallow whole.    [provider]  cephALEXin  (KEFLEX ) 500 MG capsule Take 1 capsule (500 mg total) by mouth 3 (three) times daily. 07/07/24   Tower, Laine LABOR, MD  nitroGLYCERIN  (NITROSTAT ) 0.4 MG SL tablet Place 1 tablet (0.4 mg total) under the tongue every 5 (five) minutes as needed for chest pain (CP or SOB). 03/12/16   Strader, Laymon HERO, PA-C  sulfamethoxazole -trimethoprim  (BACTRIM  DS) 800-160 MG tablet Take 1 tablet by mouth 2 (two) times daily. 07/07/24   Tower, Laine LABOR, MD  tirzepatide  (MOUNJARO ) 12.5 MG/0.5ML Pen Inject 12.5 mg into the skin once a week. 08/11/24   Tower, Laine LABOR, MD     Family History  Problem Relation Age of Onset   Dementia Mother    Hypertension Father    Heart disease Father        MI   Heart attack Father    Lung cancer Maternal Grandmother    Heart failure Paternal Grandfather     Social History   Socioeconomic History   Marital status: Married    Spouse name: Not on file   Number of children: 2   Years of education: Not on file   Highest education level: 12th grade  Occupational History   Occupation: Haematologist  Tobacco Use    Smoking status: Former    Current packs/day: 0.00    Types: Cigarettes    Quit date: 01/18/2015    Years since quitting: 9.6   Smokeless tobacco: Never  Vaping Use   Vaping status: Never Used  Substance and Sexual Activity   Alcohol use: Yes    Alcohol/week: 12.0 standard drinks of alcohol    Types: 12 Cans of beer per week    Comment: weekends   Drug use: No   Sexual activity: Yes    Partners: Female  Other Topics Concern   Not on file  Social History Narrative   Lives in [place] with wife.  Does not routinely exercise.   Social Drivers of Corporate investment banker  Strain: Low Risk  (06/18/2024)   Received from [facility]   Overall Financial Resource Strain (CARDIA)    Difficulty of Paying Living Expenses: Not very hard  Food Insecurity: No Food Insecurity (06/18/2024)   [facility] from [facility]   Hunger Vital Sign    Within the past 12 months, you worried that your food would run out before you got the money to buy more.: Never true    Within the past 12 months, the food you bought just didn't last and you didn't have money to get more.: Never true  Transportation Needs: No Transportation Needs (06/18/2024)   Received from [facility] - Transportation    In the past 12 months, has lack of transportation kept you from medical appointments or from getting medications?: No    Lack of Transportation (Non-Medical): No  Physical Activity: Insufficiently Active (06/10/2024)   Exercise Vital Sign    Days of Exercise per Week: 3 days    Minutes of Exercise per Session: 30 min  Stress: No Stress Concern Present (06/10/2024)   [employer] of Occupational Health - Occupational Stress Questionnaire    Feeling of Stress: Not at all  Social Connections: Moderately Isolated (06/10/2024)   Social Connection and Isolation Panel    Frequency of Communication with Friends and Family: Twice a week    Frequency of Social  Gatherings with Friends and Family: Once a week    Attends Religious Services: Patient declined    Database administrator or Organizations: No    Attends Engineer, structural: Not on file    Marital Status: Married     Review of Systems: A 12 point ROS discussed and pertinent positives are indicated in the HPI above.  All other systems are negative.    Vital Signs: BP (!) 140/80   Pulse 63   SpO2 95%   Advance Care Plan: No documents on file  Physical Exam Vitals and nursing note reviewed.  HENT:     Mouth/Throat:     Mouth: Mucous membranes are moist.     Pharynx: Oropharynx is clear.  Cardiovascular:     Rate and Rhythm: Normal rate and regular rhythm.  Pulmonary:     Effort: Pulmonary effort is normal.     Breath sounds: Normal breath sounds.  Abdominal:     Palpations: Abdomen is soft.     Tenderness: There is no abdominal tenderness.  Musculoskeletal:     Right lower leg: No edema.     Left lower leg: No edema.  Skin:    General: Skin is warm and dry.  Neurological:     Mental Status: He is alert and oriented to person, place, and time. Mental status is at baseline.     Imaging: No results found.  Labs:  CBC: Recent Labs    02/07/24 0859 04/10/24 1022 08/14/24 0943 08/28/24 0701  WBC 5.2 5.3 5.0 4.9  HGB 15.7 15.1 15.4 14.4  HCT 46.8 43.8 45.9 41.6  PLT 87.0* 85.0* 85.0* 73*    COAGS: Recent Labs    02/07/24 0859 08/28/24 0701  INR 1.0 0.9  APTT 33.6  --     BMP: Recent Labs    09/26/23 1034 10/23/23 1036 04/10/24 1022  NA 134* 134* 136  K 4.4 4.3 4.7  CL 102 100 101  CO2 23 25 30   GLUCOSE 220* 194* 137*  BUN 18 17 17   CALCIUM  9.3 9.3 9.5  CREATININE 1.01 0.81 0.99  GFRNONAA >60  --   --     LIVER FUNCTION TESTS: Recent Labs    10/23/23 1036 11/08/23 0949 02/07/24 0859 04/10/24 1022  BILITOT 0.7 0.6 0.5 0.6  AST 135* 83* 89* 69*  ALT 161* 118* 133* 98*  ALKPHOS 151* 167* 145* 142*  PROT 6.9 7.7 7.5 7.0   ALBUMIN 4.1 4.3 4.3 4.2    TUMOR MARKERS: No results for input(s): AFPTM, CEA, CA199, CHROMGRNA in the last 8760 hours.  Assessment and Plan:  Daniel [name] is a 58 y.o. male with hx of CAD, chronic back pain, EtOH abuse, HTN, GERD. Patient has followed with pcp for hepatic steatosis, which he reports have have had for 10 years. Was referred to GI on 07/03/24 due to elevating liver enzymes, where further labs were ordered to evaluate. 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   Risks and benefits of liver biopsy was discussed with the patient and/or patient's family including, but not limited to bleeding, infection, damage to adjacent structures or low yield requiring additional tests.  All of the questions were answered and there is agreement to proceed.  Consent signed and in chart.   Thank you for allowing our service to participate in PURL CLAYTOR 's care.  Electronically Signed: Kimble VEAR Clas, PA-C   08/28/2024, 8:05 AM      I spent a total of  15 Minutes   in face to face in clinical consultation, greater than 50% of which was counseling/coordinating care for liver biopsy

## 2024-08-28 NOTE — Progress Notes (Signed)
 Patient and patient wife given discharge instructions, education provided no further questions at this time. Patient able to ambulate and void before discharge. Patient site is clean, dry, intact  upon discharge. PA Kimble called to confirm when to restart aspirin , written on discharge paperwork and reviewed with pt and pt wife,also confirmed when patient can return to work and weight bearing status over the next few days, this was then communicated back to the patient.

## 2024-08-28 NOTE — Procedures (Signed)
 Pre procedural Dx: Elv AMA-M2  Post procedural Dx: Same  Technically successful US  guided biopsy of liver   EBL: None.   Complications: None immediate.   Daniel Banner, MD Pager #: [phone]

## 2024-09-04 LAB — SURGICAL PATHOLOGY

## 2024-09-06 ENCOUNTER — Ambulatory Visit: Payer: Self-pay | Admitting: Gastroenterology

## 2024-09-07 ENCOUNTER — Other Ambulatory Visit: Payer: Self-pay | Admitting: Family Medicine

## 2024-09-07 ENCOUNTER — Other Ambulatory Visit: Payer: Self-pay | Admitting: *Deleted

## 2024-09-07 NOTE — Telephone Encounter (Signed)
 Pt sent a mychart message saying:  I need a prescription for the 12.5 Mojarro if you would put me a couple refills on it so I have not got to go through this every month it's good where it's at do not need to raise   Last OV was an acute appt on 07/02/24, no future appts., [facility]

## 2024-09-07 NOTE — Telephone Encounter (Unsigned)
 Copied from CRM #[id number]. Topic: Clinical - Medication Refill >> Sep 07, 2024  4:25 PM Leah C wrote: Medication: tirzepatide  (MOUNJARO ) 12.5 MG/0.5ML Pen  Has the patient contacted their pharmacy? Yes patient called pharmacy and also tried to refill via mychart. Patient is requesting if he can get a couple refills.   (Agent: If no, request that the patient contact the pharmacy for the refill. If patient does not wish to contact the pharmacy document the reason why and proceed with request.) (Agent: If yes, when and what did the pharmacy advise?)  This is the patient's preferred pharmacy:  [phone] [place] [facility] [address] [place] [address] Phone: [phone] Fax: [phone]    Is this the correct pharmacy for this prescription? Yes If no, delete pharmacy and type the correct one.   Has the prescription been filled recently? Yes  Is the patient out of the medication? Yes. Patient is supposed to take a shot this Saturday.   Has the patient been seen for an appointment in the last year OR does the patient have an upcoming appointment? Yes  Can we respond through MyChart? Yes  Agent: Please be advised that Rx refills may take up to 3 business days. We ask that you follow-up with your pharmacy.

## 2024-09-08 MED ORDER — TIRZEPATIDE 12.5 MG/0.5ML ~~LOC~~ SOAJ: 12.5000 mg | SUBCUTANEOUS | 3 refills | Status: AC

## 2024-09-18 ENCOUNTER — Ambulatory Visit (AMBULATORY_SURGERY_CENTER): Admitting: Gastroenterology

## 2024-09-18 ENCOUNTER — Encounter: Payer: Self-pay | Admitting: Gastroenterology

## 2024-09-18 VITALS — BP 130/64 | HR 60 | Temp 99.0°F | Resp 23 | Ht 68.0 in | Wt 260.0 lb

## 2024-09-18 DIAGNOSIS — D123 Benign neoplasm of transverse colon: Secondary | ICD-10-CM

## 2024-09-18 DIAGNOSIS — D125 Benign neoplasm of sigmoid colon: Secondary | ICD-10-CM | POA: Diagnosis not present

## 2024-09-18 DIAGNOSIS — K635 Polyp of colon: Secondary | ICD-10-CM

## 2024-09-18 DIAGNOSIS — K641 Second degree hemorrhoids: Secondary | ICD-10-CM

## 2024-09-18 DIAGNOSIS — R195 Other fecal abnormalities: Secondary | ICD-10-CM

## 2024-09-18 DIAGNOSIS — K2970 Gastritis, unspecified, without bleeding: Secondary | ICD-10-CM

## 2024-09-18 DIAGNOSIS — D127 Benign neoplasm of rectosigmoid junction: Secondary | ICD-10-CM

## 2024-09-18 DIAGNOSIS — K5730 Diverticulosis of large intestine without perforation or abscess without bleeding: Secondary | ICD-10-CM

## 2024-09-18 DIAGNOSIS — D122 Benign neoplasm of ascending colon: Secondary | ICD-10-CM | POA: Diagnosis not present

## 2024-09-18 DIAGNOSIS — K3189 Other diseases of stomach and duodenum: Secondary | ICD-10-CM | POA: Diagnosis not present

## 2024-09-18 DIAGNOSIS — Z1211 Encounter for screening for malignant neoplasm of colon: Secondary | ICD-10-CM

## 2024-09-18 DIAGNOSIS — R7989 Other specified abnormal findings of blood chemistry: Secondary | ICD-10-CM

## 2024-09-18 MED ORDER — SODIUM CHLORIDE 0.9 % IV SOLN: 500.0000 mL | Freq: Once | INTRAVENOUS | Status: DC

## 2024-09-18 NOTE — Progress Notes (Signed)
 Pt's states no medical or surgical changes since previsit or office visit.

## 2024-09-18 NOTE — Op Note (Signed)
 [facility] Patient Name: Daniel [name] Procedure Date: 09/18/2024 3:31 PM MRN: [id number] Endoscopist: Sandor Flatter , MD, [id number] Age: 58 Referring MD:  Date of Birth: 11-Oct-1966 Gender: Male Account #: [id number] Procedure:                Colonoscopy Indications:              Screening for colorectal malignant neoplasm, This                            is the patient's first colonoscopy.                           Recent positive Cologuard test. Medicines:                Monitored Anesthesia Care Procedure:                Pre-Anesthesia Assessment:                           - Prior to the procedure, a History and Physical                            was performed, and patient medications and                            allergies were reviewed. The patient's tolerance of                            previous anesthesia was also reviewed. The risks                            and benefits of the procedure and the sedation                            options and risks were discussed with the patient.                            All questions were answered, and informed consent                            was obtained. Prior Anticoagulants: The patient has                            taken no anticoagulant or antiplatelet agents. ASA                            Grade Assessment: II - A patient with mild systemic                            disease. After reviewing the risks and benefits,                            the patient was deemed in satisfactory condition to  undergo the procedure.                           After obtaining informed consent, the colonoscope                            was passed under direct vision. Throughout the                            procedure, the patient's blood pressure, pulse, and                            oxygen saturations were monitored continuously. The                            Olympus Scope SN [id number] was introduced  through the                            anus and advanced to the the cecum, identified by                            appendiceal orifice and ileocecal valve. The                            colonoscopy was performed without difficulty. The                            patient tolerated the procedure well. The quality                            of the bowel preparation was good. The ileocecal                            valve, appendiceal orifice, and rectum were                            photographed. Scope In: 3:52:08 PM Scope Out: 4:26:10 PM Scope Withdrawal Time: 0 hours 32 minutes 10 seconds  Total Procedure Duration: 0 hours 34 minutes 2 seconds  Findings:                 The perianal and digital rectal examinations were                            normal.                           Six sessile polyps were found in the transverse                            colon and ascending colon. The polyps were 3 to 6                            mm in size. These polyps were removed with a cold  snare. Resection and retrieval were complete.                            Estimated blood loss was minimal.                           Four sessile polyps were found in the sigmoid                            colon. The polyps were 2 to 6 mm in size. These                            polyps were removed with a cold snare. Resection                            and retrieval were complete. Estimated blood loss                            was minimal.                           Two sessile polyps were found in the sigmoid colon.                            The polyps were 10 to 12 mm in size. These polyps                            were removed with a cold snare. Resection and                            retrieval were complete. Estimated blood loss was                            minimal.                           Three sessile polyps were found in the                            recto-sigmoid  colon. The polyps were 3 to 5 mm in                            size. These polyps were removed with a cold snare.                            Resection and retrieval were complete. Estimated                            blood loss was minimal.                           A few small-mouthed diverticula were found in the  sigmoid colon.                           Non-bleeding internal hemorrhoids were found during                            retroflexion. The hemorrhoids were small and Grade                            II (internal hemorrhoids that prolapse but reduce                            spontaneously). Complications:            No immediate complications. Estimated Blood Loss:     Estimated blood loss was minimal. Impression:               - Six 3 to 6 mm polyps in the transverse colon and                            in the ascending colon, removed with a cold snare.                            Resected and retrieved.                           - Four 2 to 6 mm polyps in the sigmoid colon,                            removed with a cold snare. Resected and retrieved.                           - Two 10 to 12 mm polyps in the sigmoid colon,                            removed with a cold snare. Resected and retrieved.                           - Three 3 to 5 mm polyps at the recto-sigmoid                            colon, removed with a cold snare. Resected and                            retrieved.                           - Diverticulosis in the sigmoid colon.                           - Non-bleeding internal hemorrhoids.                           - Due to history of thrombocytopenia, each of the  polypectomy sites was lavaged and observed for                            several minutes. No prolonged bleeding was observed. Recommendation:           - Patient has a contact number available for                            emergencies. The signs  and symptoms of potential                            delayed complications were discussed with the                            patient. Return to normal activities tomorrow.                            Written discharge instructions were provided to the                            patient.                           - Resume previous diet.                           - Continue present medications.                           - Await pathology results.                           - Repeat colonoscopy for surveillance based on                            pathology results.                           - Return to GI clinic PRN. Sandor Flatter, MD 09/18/2024 4:37:39 PM

## 2024-09-18 NOTE — Progress Notes (Signed)
 Transferred to PACU via stretcher.  Not responding to stimulation at this time.  VSS upon leaving procedure room.

## 2024-09-18 NOTE — Progress Notes (Signed)
 GASTROENTEROLOGY PROCEDURE H&P NOTE   Primary Care Physician: Tower, Laine LABOR, MD    Reason for Procedure:  Esophageal varices screening, NASH, positive Cologuard  Plan:    EGD, colonoscopy  Patient is appropriate for endoscopic procedure(s) in the ambulatory ([facility]) setting.  The nature of the procedure, as well as the risks, benefits, and alternatives were carefully and thoroughly reviewed with the patient. Ample time for discussion and questions allowed. The patient understood, was satisfied, and agreed to proceed. I personally addressed all patient questions and concerns.     HPI: Daniel [name] is a 58 y.o. male who presents for EGD for esophageal varices screening along with colonoscopy for evaluation of recent positive Cologuard.  Past Medical History:  Diagnosis Date   Arthritis    CAD (coronary artery disease)    a. NSTEMI: 100% stenosis of the distal RCA --> DES placed 5/17 CLEARED BY CARDIOLOGIST   Chronic low back pain    a. s/p back surgery.   CVA (cerebral infarction)    a. 02/2015 dyarthria 2/2 CVA involving the lateral aspect of the precentral gyrus.   DM2 (diabetes mellitus, type 2) (HCC)    a. pre-diabetic in the past. b. A1c 03/2016 elevated to 7.9.   ETOH abuse    GERD (gastroesophageal reflux disease)    History of echocardiogram    a. Mild LVH, EF 55-60%, Definity  contrast used-LV wall motion could not be adequately assessed   History of non-ST elevation myocardial infarction (NSTEMI) 03/2016   a. PCI: 3.5 x 24 mm Promus Premier DES to distal RCA   History of tobacco abuse    Hypertension    a. 02/2015 echo: 55-65%, trace TR/MR. b. 03/2016: echo with EF of 55-60%.    Myocardial infarction [facility])    Obesity    Ruptured disk    Stroke [facility])    2016    Past Surgical History:  Procedure Laterality Date   CARDIAC CATHETERIZATION  05/2004   minimal CAD   CARDIAC CATHETERIZATION N/A 03/10/2016   Procedure: Left Heart Cath and Coronary Angiography;   Surgeon: Lonni JONETTA Cash, MD;  Location: [facility] INVASIVE CV LAB;  Service: Cardiovascular;  Laterality: N/A;   CARDIAC CATHETERIZATION N/A 03/10/2016   Procedure: Coronary Stent Intervention;  Surgeon: Lonni JONETTA Cash, MD;  Location: [facility] INVASIVE CV LAB;  Service: Cardiovascular;  Laterality: N/A;   CATARACT EXTRACTION W/PHACO Left 07/12/2016   Procedure: CATARACT EXTRACTION PHACO AND INTRAOCULAR LENS PLACEMENT (IOC);  Surgeon: Adine Oneil Novak, MD;  Location: [facility] ORS;  Service: Ophthalmology;  Laterality: Left;  US   00:50 AP% 9.2 CDE 4.63 fluid pack lot # [id number]   CATARACT EXTRACTION W/PHACO Right 10/23/2021   Procedure: CATARACT EXTRACTION PHACO AND INTRAOCULAR LENS PLACEMENT (IOC) RIGHT DIABETIC;  Surgeon: Novak Adine Oneil, MD;  Location: [facility]GERY [facility];  Service: Ophthalmology;  Laterality: Right;  Diabetic 1.15 00:16.4   CORONARY ANGIOPLASTY     STENT 5/17   LUMBAR FUSION  2000   ruptured disk, L5   SHOULDER ARTHROSCOPY WITH ROTATOR CUFF REPAIR Left    TOTAL HIP ARTHROPLASTY Left 06/03/2019   Procedure: TOTAL HIP ARTHROPLASTY;  Surgeon: Mardee Lynwood SQUIBB, MD;  Location: [facility] ORS;  Service: Orthopedics;  Laterality: Left;   TOTAL HIP ARTHROPLASTY Right 03/30/2020   Procedure: TOTAL HIP ARTHROPLASTY;  Surgeon: Mardee Lynwood SQUIBB, MD;  Location: [facility] ORS;  Service: Orthopedics;  Laterality: Right;  posterior     Prior to Admission medications   Medication Sig Start Date End  Date Taking? Authorizing Provider  amLODipine  (NORVASC ) 5 MG tablet Take 1 tablet (5 mg total) by mouth daily. 10/23/23  Yes Tower, Laine LABOR, MD  aspirin  EC 81 MG tablet Take 81 mg by mouth daily. Swallow whole.   Yes [provider]  celecoxib  (CELEBREX ) 200 MG capsule Take 200 mg by mouth daily.   Yes [provider]  glipiZIDE  (GLUCOTROL  XL) 2.5 MG 24 hr tablet TAKE 1 TABLET BY MOUTH EVERY DAY WITH BREAKFAST 04/24/24  Yes Tower, Laine LABOR, MD  hydrochlorothiazide  (HYDRODIURIL ) 25 MG  tablet Take 0.5 tablets (12.5 mg total) by mouth daily. 10/23/23  Yes Tower, Laine LABOR, MD  metFORMIN  (GLUCOPHAGE ) 1000 MG tablet Take 1 tablet (1,000 mg total) by mouth 2 (two) times daily with a meal. 10/23/23  Yes Tower, Laine LABOR, MD  valsartan  (DIOVAN ) 80 MG tablet Take 1 tablet (80 mg total) by mouth daily. 10/23/23  Yes Tower, Laine LABOR, MD  cephALEXin  (KEFLEX ) 500 MG capsule Take 1 capsule (500 mg total) by mouth 3 (three) times daily. Patient not taking: Reported on 09/18/2024 07/07/24   Tower, Laine LABOR, MD  cyanocobalamin  (VITAMIN B12) 1000 MCG tablet Take 1,000 mcg by mouth daily.    [provider]  Evolocumab  (REPATHA  SURECLICK) 140 MG/ML SOAJ Inject 140 mg into the skin every 14 (fourteen) days. 06/08/24   Verlin Lonni BIRCH, MD  nitroGLYCERIN  (NITROSTAT ) 0.4 MG SL tablet Place 1 tablet (0.4 mg total) under the tongue every 5 (five) minutes as needed for chest pain (CP or SOB). 03/12/16   Strader, Laymon HERO, PA-C  sildenafil  (VIAGRA ) 100 MG tablet TAKE 1/2 - 1 TABLET BY MOUTH DAILY AS NEEDED FOR ERECTILE DYSFUNCTION 08/13/24   Tower, Laine LABOR, MD  sulfamethoxazole -trimethoprim  (BACTRIM  DS) 800-160 MG tablet Take 1 tablet by mouth 2 (two) times daily. 07/07/24   Tower, Laine LABOR, MD  tirzepatide  (MOUNJARO ) 12.5 MG/0.5ML Pen Inject 12.5 mg into the skin once a week. 09/08/24   Tower, Laine LABOR, MD    Current Outpatient Medications  Medication Sig Dispense Refill   amLODipine  (NORVASC ) 5 MG tablet Take 1 tablet (5 mg total) by mouth daily. 90 tablet 3   aspirin  EC 81 MG tablet Take 81 mg by mouth daily. Swallow whole.     celecoxib  (CELEBREX ) 200 MG capsule Take 200 mg by mouth daily.     glipiZIDE  (GLUCOTROL  XL) 2.5 MG 24 hr tablet TAKE 1 TABLET BY MOUTH EVERY DAY WITH BREAKFAST 90 tablet 1   hydrochlorothiazide  (HYDRODIURIL ) 25 MG tablet Take 0.5 tablets (12.5 mg total) by mouth daily. 45 tablet 3   metFORMIN  (GLUCOPHAGE ) 1000 MG tablet Take 1 tablet (1,000 mg total) by mouth 2 (two) times  daily with a meal. 180 tablet 3   valsartan  (DIOVAN ) 80 MG tablet Take 1 tablet (80 mg total) by mouth daily. 90 tablet 3   cephALEXin  (KEFLEX ) 500 MG capsule Take 1 capsule (500 mg total) by mouth 3 (three) times daily. (Patient not taking: Reported on 09/18/2024) 21 capsule 0   cyanocobalamin  (VITAMIN B12) 1000 MCG tablet Take 1,000 mcg by mouth daily.     Evolocumab  (REPATHA  SURECLICK) 140 MG/ML SOAJ Inject 140 mg into the skin every 14 (fourteen) days. 6 mL 1   nitroGLYCERIN  (NITROSTAT ) 0.4 MG SL tablet Place 1 tablet (0.4 mg total) under the tongue every 5 (five) minutes as needed for chest pain (CP or SOB). 25 tablet 3   sildenafil  (VIAGRA ) 100 MG tablet TAKE 1/2 - 1 TABLET BY  MOUTH DAILY AS NEEDED FOR ERECTILE DYSFUNCTION 5 tablet 5   sulfamethoxazole -trimethoprim  (BACTRIM  DS) 800-160 MG tablet Take 1 tablet by mouth 2 (two) times daily. 14 tablet 0   tirzepatide  (MOUNJARO ) 12.5 MG/0.5ML Pen Inject 12.5 mg into the skin once a week. 2 mL 3   Current Facility-Administered Medications  Medication Dose Route Frequency Provider Last Rate Last Admin   0.9 %  sodium chloride  infusion  500 mL Intravenous Once Satoshi Kalas V, DO        Allergies as of 09/18/2024 - Review Complete 09/18/2024  Allergen Reaction Noted   Ace inhibitors Cough 07/01/2007    Family History  Problem Relation Age of Onset   Dementia Mother    Hypertension Father    Heart disease Father        MI   Heart attack Father    Lung cancer Maternal Grandmother    Heart failure Paternal Grandfather     Social History   Socioeconomic History   Marital status: Married    Spouse name: Not on file   Number of children: 2   Years of education: Not on file   Highest education level: 12th grade  Occupational History   Occupation: haematologist  Tobacco Use   Smoking status: Former    Current packs/day: 0.00    Types: Cigarettes    Quit date: 01/18/2015    Years since quitting: 9.6   Smokeless tobacco: Never   Vaping Use   Vaping status: Never Used  Substance and Sexual Activity   Alcohol use: Yes    Alcohol/week: 12.0 standard drinks of alcohol    Types: 12 Cans of beer per week    Comment: weekends   Drug use: No   Sexual activity: Yes    Partners: Female  Other Topics Concern   Not on file  Social History Narrative   Lives in [place] with wife.  Does not routinely exercise.   Social Drivers of Corporate Investment Banker Strain: Low Risk  (06/18/2024)   Received from [facility]   Overall Financial Resource Strain (CARDIA)    Difficulty of Paying Living Expenses: Not very hard  Food Insecurity: No Food Insecurity (06/18/2024)   Received from [facility]   Hunger Vital Sign    Within the past 12 months, you worried that your food would run out before you got the money to buy more.: Never true    Within the past 12 months, the food you bought just didn't last and you didn't have money to get more.: Never true  Transportation Needs: No Transportation Needs (06/18/2024)   Received from [facility] - Transportation    In the past 12 months, has lack of transportation kept you from medical appointments or from getting medications?: No    Lack of Transportation (Non-Medical): No  Physical Activity: Insufficiently Active (06/10/2024)   Exercise Vital Sign    Days of Exercise per Week: 3 days    Minutes of Exercise per Session: 30 min  Stress: No Stress Concern Present (06/10/2024)   [employer] of Occupational Health - Occupational Stress Questionnaire    Feeling of Stress: Not at all  Social Connections: Moderately Isolated (06/10/2024)   Social Connection and Isolation Panel    Frequency of Communication with Friends and Family: Twice a week    Frequency of Social Gatherings with Friends and Family: Once a week    Attends Religious Services: Patient declined  Active Member of Clubs or Organizations: No    Attends Occupational Hygienist Meetings: Not on file    Marital Status: Married  Intimate Partner Violence: Unknown (02/09/2022)   Received from [facility]   HITS    Physically Hurt: Not on file    Insult or Talk Down To: Not on file    Threaten Physical Harm: Not on file    Scream or Curse: Not on file    Physical Exam: Vital signs in last 24 hours: @BP  (!) 140/78   Pulse 71   Temp 99 F (37.2 C)   Ht 5' 8 (1.727 m)   Wt 260 lb (117.9 kg)   SpO2 96%   BMI 39.53 kg/m  GEN: NAD EYE: Sclerae anicteric ENT: MMM CV: Non-tachycardic Pulm: CTA b/l GI: Soft, NT/ND NEURO:  Alert & Oriented x 3   Sandor Flatter, DO [facility]   09/18/2024 2:58 PM

## 2024-09-18 NOTE — Op Note (Signed)
 [facility] Patient Name: Daniel [name] Procedure Date: 09/18/2024 3:37 PM MRN: [id number] Endoscopist: Sandor Flatter , MD, [id number] Age: 58 Referring MD:  Date of Birth: 02/23/1966 Gender: Male Account #: [id number] Procedure:                Upper GI endoscopy Indications:              NASH, Portal hypertension rule out esophageal                            varices Medicines:                Monitored Anesthesia Care Procedure:                Pre-Anesthesia Assessment:                           - Prior to the procedure, a History and Physical                            was performed, and patient medications and                            allergies were reviewed. The patient's tolerance of                            previous anesthesia was also reviewed. The risks                            and benefits of the procedure and the sedation                            options and risks were discussed with the patient.                            All questions were answered, and informed consent                            was obtained. Prior Anticoagulants: The patient has                            taken no anticoagulant or antiplatelet agents. ASA                            Grade Assessment: II - A patient with mild systemic                            disease. After reviewing the risks and benefits,                            the patient was deemed in satisfactory condition to                            undergo the procedure.  After obtaining informed consent, the endoscope was                            passed under direct vision. Throughout the                            procedure, the patient's blood pressure, pulse, and                            oxygen saturations were monitored continuously. The                            Olympus Scope [id number] was introduced through the                            mouth, and advanced to the second part of  duodenum.                            The upper GI endoscopy was accomplished without                            difficulty. The patient tolerated the procedure                            well. Scope In: Scope Out: Findings:                 The examined esophagus was normal.                           Mild inflammation characterized by congestion                            (edema) and erythema was found in the gastric body                            and in the gastric antrum. Biopsies were taken with                            a cold forceps for Helicobacter pylori testing.                            Estimated blood loss was minimal.                           The examined duodenum was normal. Complications:            No immediate complications. Estimated Blood Loss:     Estimated blood loss was minimal. Impression:               - Normal esophagus.                           - Mild, non-ulcer gastritis. Biopsied.                           -  Normal examined duodenum. Recommendation:           - Patient has a contact number available for                            emergencies. The signs and symptoms of potential                            delayed complications were discussed with the                            patient. Return to normal activities tomorrow.                            Written discharge instructions were provided to the                            patient.                           - Resume previous diet.                           - Continue present medications.                           - Await pathology results.                           - Perform a colonoscopy today. Sandor Flatter, MD 09/18/2024 4:30:53 PM

## 2024-09-18 NOTE — Patient Instructions (Signed)

## 2024-09-21 ENCOUNTER — Telehealth: Payer: Self-pay | Admitting: *Deleted

## 2024-09-21 NOTE — Telephone Encounter (Signed)
  Follow up Call-     09/18/2024    2:45 PM  Call back number  Post procedure Call Back phone  # [phone]  Permission to leave phone message Yes     Patient questions:  Do you have a fever, pain , or abdominal swelling? No. Pain Score  0 *  Have you tolerated food without any problems? Yes.    Have you been able to return to your normal activities? Yes.    Do you have any questions about your discharge instructions: Diet   No. Medications  No. Follow up visit  No.  Do you have questions or concerns about your Care? No.  Actions: * If pain score is 4 or above: No action needed, pain <4.

## 2024-09-23 LAB — SURGICAL PATHOLOGY

## 2024-09-25 ENCOUNTER — Ambulatory Visit: Payer: Self-pay | Admitting: Gastroenterology

## 2024-10-07 ENCOUNTER — Other Ambulatory Visit: Payer: Self-pay

## 2024-10-07 ENCOUNTER — Emergency Department (HOSPITAL_COMMUNITY)

## 2024-10-07 ENCOUNTER — Observation Stay (HOSPITAL_COMMUNITY)

## 2024-10-07 ENCOUNTER — Observation Stay (HOSPITAL_COMMUNITY): Admission: EM | Admit: 2024-10-07 | Discharge: 2024-10-08 | Disposition: A

## 2024-10-07 ENCOUNTER — Encounter (HOSPITAL_COMMUNITY): Payer: Self-pay

## 2024-10-07 DIAGNOSIS — I639 Cerebral infarction, unspecified: Principal | ICD-10-CM | POA: Diagnosis present

## 2024-10-07 DIAGNOSIS — R2990 Unspecified symptoms and signs involving the nervous system: Principal | ICD-10-CM

## 2024-10-07 DIAGNOSIS — E1169 Type 2 diabetes mellitus with other specified complication: Secondary | ICD-10-CM | POA: Diagnosis present

## 2024-10-07 DIAGNOSIS — E119 Type 2 diabetes mellitus without complications: Secondary | ICD-10-CM

## 2024-10-07 DIAGNOSIS — E1165 Type 2 diabetes mellitus with hyperglycemia: Secondary | ICD-10-CM | POA: Diagnosis present

## 2024-10-07 DIAGNOSIS — I2510 Atherosclerotic heart disease of native coronary artery without angina pectoris: Secondary | ICD-10-CM | POA: Diagnosis present

## 2024-10-07 DIAGNOSIS — Z87891 Personal history of nicotine dependence: Secondary | ICD-10-CM

## 2024-10-07 LAB — DIFFERENTIAL
Abs Immature Granulocytes: 0.05 K/uL (ref 0.00–0.07)
Basophils Absolute: 0.1 K/uL (ref 0.0–0.1)
Basophils Relative: 1 %
Eosinophils Absolute: 0.5 K/uL (ref 0.0–0.5)
Eosinophils Relative: 7 %
Immature Granulocytes: 1 %
Lymphocytes Relative: 19 %
Lymphs Abs: 1.3 K/uL (ref 0.7–4.0)
Monocytes Absolute: 0.6 K/uL (ref 0.1–1.0)
Monocytes Relative: 9 %
Neutro Abs: 4.4 K/uL (ref 1.7–7.7)
Neutrophils Relative %: 63 %

## 2024-10-07 LAB — I-STAT CHEM 8, ED
BUN: 18 mg/dL (ref 6–20)
Calcium, Ion: 1.11 mmol/L — ABNORMAL LOW (ref 1.15–1.40)
Chloride: 102 mmol/L (ref 98–111)
Creatinine, Ser: 1.2 mg/dL (ref 0.61–1.24)
Glucose, Bld: 189 mg/dL — ABNORMAL HIGH (ref 70–99)
HCT: 46 % (ref 39.0–52.0)
Hemoglobin: 15.6 g/dL (ref 13.0–17.0)
Potassium: 4.7 mmol/L (ref 3.5–5.1)
Sodium: 137 mmol/L (ref 135–145)
TCO2: 25 mmol/L (ref 22–32)

## 2024-10-07 LAB — RAPID URINE DRUG SCREEN, HOSP PERFORMED
Amphetamines: NOT DETECTED
Barbiturates: NOT DETECTED
Benzodiazepines: NOT DETECTED
Cocaine: NOT DETECTED
Opiates: NOT DETECTED
Tetrahydrocannabinol: NOT DETECTED

## 2024-10-07 LAB — CBC
HCT: 45.1 % (ref 39.0–52.0)
Hemoglobin: 15.3 g/dL (ref 13.0–17.0)
MCH: 32.1 pg (ref 26.0–34.0)
MCHC: 33.9 g/dL (ref 30.0–36.0)
MCV: 94.5 fL (ref 80.0–100.0)
Platelets: 111 K/uL — ABNORMAL LOW (ref 150–400)
RBC: 4.77 MIL/uL (ref 4.22–5.81)
RDW: 12.5 % (ref 11.5–15.5)
WBC: 7 K/uL (ref 4.0–10.5)
nRBC: 0 % (ref 0.0–0.2)

## 2024-10-07 LAB — COMPREHENSIVE METABOLIC PANEL WITH GFR
ALT: 169 U/L — ABNORMAL HIGH (ref 0–44)
AST: 170 U/L — ABNORMAL HIGH (ref 15–41)
Albumin: 3.5 g/dL (ref 3.5–5.0)
Alkaline Phosphatase: 152 U/L — ABNORMAL HIGH (ref 38–126)
Anion gap: 8 (ref 5–15)
BUN: 14 mg/dL (ref 6–20)
CO2: 24 mmol/L (ref 22–32)
Calcium: 8.9 mg/dL (ref 8.9–10.3)
Chloride: 101 mmol/L (ref 98–111)
Creatinine, Ser: 1.17 mg/dL (ref 0.61–1.24)
GFR, Estimated: >60 mL/min (ref >60)
Glucose, Bld: 192 mg/dL — ABNORMAL HIGH (ref 70–99)
Potassium: 4.8 mmol/L (ref 3.5–5.1)
Sodium: 133 mmol/L — ABNORMAL LOW (ref 135–145)
Total Bilirubin: 1 mg/dL (ref 0.0–1.2)
Total Protein: 7 g/dL (ref 6.5–8.1)

## 2024-10-07 LAB — APTT: aPTT: 31 s (ref 24–36)

## 2024-10-07 LAB — CBG MONITORING, ED: Glucose-Capillary: 190 mg/dL — ABNORMAL HIGH (ref 70–99)

## 2024-10-07 LAB — PROTIME-INR
INR: 0.9 (ref 0.8–1.2)
Prothrombin Time: 12.8 s (ref 11.4–15.2)

## 2024-10-07 LAB — ETHANOL: Alcohol, Ethyl (B): <15 mg/dL (ref <15)

## 2024-10-07 MED ORDER — CLOPIDOGREL BISULFATE 300 MG PO TABS
300.0000 mg | ORAL_TABLET | Freq: Once | ORAL | Status: AC
  Administered 2024-10-07: 300 mg via ORAL
  Filled 2024-10-07: qty 1

## 2024-10-07 MED ORDER — INSULIN ASPART 100 UNIT/ML IJ SOLN
0.0000 [IU] | INTRAMUSCULAR | Status: DC
  Administered 2024-10-08 (×3): 1 [IU] via SUBCUTANEOUS
  Filled 2024-10-07 (×2): qty 1

## 2024-10-07 MED ORDER — ASPIRIN 325 MG PO TABS: 325.0000 mg | ORAL_TABLET | Freq: Every day | ORAL | Status: DC

## 2024-10-07 MED ORDER — IOHEXOL 350 MG/ML SOLN
75.0000 mL | Freq: Once | INTRAVENOUS | Status: AC | PRN
  Administered 2024-10-07: 75 mL via INTRAVENOUS

## 2024-10-07 NOTE — ED Notes (Signed)
 Patient transported to CT

## 2024-10-07 NOTE — Code Documentation (Signed)
 Stroke Response Nurse Documentation Code Documentation  Daniel [name] is a 58 y.o. male arriving to [place]  via Private Vehicle on 12/3 with past medical hx of HTN, obesity, DM2, CAD with MI/stent, CVA. On No antithrombotic. Code stroke was activated by ED.   Patient from home where he was LKW at 1800 and now complaining of left hand weakness and numbness.   Stroke team at the bedside on patient arrival. Labs drawn and patient cleared for CT by EDP. Patient to CT with team. NIHSS 2, see documentation for details and code stroke times. Patient with right hemianopia and left decreased sensation on exam. The following imaging was completed:  CT Head. Patient is not a candidate for IV Thrombolytic due to too mild to treat. Patient is not a candidate for IR due to low suspicion of LVO.   Care Plan: Neuro checks q2 hrs, MRI.    Bedside handoff with ED RN Izetta.    Griselda Alm ORN  Rapid Response RN

## 2024-10-07 NOTE — ED Notes (Signed)
 Patient transported back from MRI.

## 2024-10-07 NOTE — ED Notes (Addendum)
 Patient transported to MRI with nurse transport.

## 2024-10-07 NOTE — ED Provider Triage Note (Addendum)
 Emergency Medicine Provider Triage Evaluation Note  Daniel [name] , a 58 y.o. male  was evaluated in triage.  Pt complains of left arm weakness and hand numbness that started at 6 PM this evening.  He reports difficulty with moving his left upper extremity.  Denies any other symptoms.  He reports previous strokes, but no left-sided deficits from previous strokes.  Review of Systems  Positive: As above Negative: As above  Physical Exam  Pulse 74   Temp 98.2 F (36.8 C) (Oral)   Resp 16   Wt 115.2 kg   SpO2 98%   BMI 38.62 kg/m  Gen:   Awake, no distress   Resp:  Normal effort  MSK:   Moves extremities without difficulty  Other:  4/5 strength in the left upper extremity compared to 5/5 strength in the right upper extremity.  Reports diminished sensation in the left hand compared to the right hand.  Medical Decision Making  Medically screening exam initiated at 7:43 PM.  Appropriate orders placed.  Daniel [name] was informed that the remainder of the evaluation will be completed by another provider, this initial triage assessment does not replace that evaluation, and the importance of remaining in the ED until their evaluation is complete.  Code stroke activated upon arrival for Left arm weakness, last known well 6pm   Veta Palma, PA-C 10/07/24 1943    Veta Palma, PA-C 10/07/24 1944

## 2024-10-07 NOTE — ED Provider Notes (Signed)
 [place] EMERGENCY DEPARTMENT AT [facility] Provider Note   CSN: [id number] Arrival date & time: 10/07/24  1928  An emergency department physician performed an initial assessment on this suspected stroke patient at 27.  Patient presents with: Weakness   Daniel [name] is a 58 y.o. male.   This is a 58 year old male presenting emergency department with left arm/hand weakness that started this evening at 6 PM.  Reports was in the shower got out with paresthesia to his left hand primarily to his middle ring and pinky finger, but having paresthesias to the entire hand.  Has a history of prior stroke, but no prior left-sided deficits.  Not on blood thinner.   Weakness      Prior to Admission medications   Medication Sig Start Date End Date Taking? Authorizing Provider  amLODipine  (NORVASC ) 5 MG tablet Take 1 tablet (5 mg total) by mouth daily. 10/23/23   Tower, Laine LABOR, MD  aspirin  EC 81 MG tablet Take 81 mg by mouth daily. Swallow whole.    [provider]  celecoxib  (CELEBREX ) 200 MG capsule Take 200 mg by mouth daily.    [provider]  cephALEXin  (KEFLEX ) 500 MG capsule Take 1 capsule (500 mg total) by mouth 3 (three) times daily. Patient not taking: Reported on 09/18/2024 07/07/24   Tower, Laine LABOR, MD  cyanocobalamin  (VITAMIN B12) 1000 MCG tablet Take 1,000 mcg by mouth daily.    [provider]  Evolocumab  (REPATHA  SURECLICK) 140 MG/ML SOAJ Inject 140 mg into the skin every 14 (fourteen) days. 06/08/24   Verlin Lonni BIRCH, MD  glipiZIDE  (GLUCOTROL  XL) 2.5 MG 24 hr tablet TAKE 1 TABLET BY MOUTH EVERY DAY WITH BREAKFAST 04/24/24   Tower, Laine LABOR, MD  hydrochlorothiazide  (HYDRODIURIL ) 25 MG tablet Take 0.5 tablets (12.5 mg total) by mouth daily. 10/23/23   Tower, Laine LABOR, MD  metFORMIN  (GLUCOPHAGE ) 1000 MG tablet Take 1 tablet (1,000 mg total) by mouth 2 (two) times daily with a meal. 10/23/23   Tower, Laine LABOR, MD  nitroGLYCERIN  (NITROSTAT )  0.4 MG SL tablet Place 1 tablet (0.4 mg total) under the tongue every 5 (five) minutes as needed for chest pain (CP or SOB). 03/12/16   Strader, Laymon HERO, PA-C  sildenafil  (VIAGRA ) 100 MG tablet TAKE 1/2 - 1 TABLET BY MOUTH DAILY AS NEEDED FOR ERECTILE DYSFUNCTION 08/13/24   Tower, Laine LABOR, MD  sulfamethoxazole -trimethoprim  (BACTRIM  DS) 800-160 MG tablet Take 1 tablet by mouth 2 (two) times daily. 07/07/24   Tower, Laine LABOR, MD  tirzepatide  (MOUNJARO ) 12.5 MG/0.5ML Pen Inject 12.5 mg into the skin once a week. 09/08/24   Tower, Laine LABOR, MD  valsartan  (DIOVAN ) 80 MG tablet Take 1 tablet (80 mg total) by mouth daily. 10/23/23   Tower, Laine LABOR, MD    Allergies: Ace inhibitors    Review of Systems  Neurological:  Positive for weakness.    Updated Vital Signs BP (!) 113/55   Pulse 71   Temp 98.2 F (36.8 C) (Oral)   Resp (!) 22   Ht 5' 9 (1.753 m)   Wt 115.2 kg   SpO2 98%   BMI 37.51 kg/m   Physical Exam Vitals and nursing note reviewed.  HENT:     Head: Normocephalic and atraumatic.     Mouth/Throat:     Mouth: Mucous membranes are dry.  Eyes:     Conjunctiva/sclera: Conjunctivae normal.  Cardiovascular:     Rate and Rhythm: Normal rate and regular  rhythm.  Pulmonary:     Effort: Pulmonary effort is normal.     Breath sounds: Normal breath sounds.  Abdominal:     General: Abdomen is flat. There is no distension.     Tenderness: There is no abdominal tenderness. There is no guarding or rebound.  Musculoskeletal:        General: Normal range of motion.  Skin:    General: Skin is warm and dry.     Capillary Refill: Capillary refill takes less than 2 seconds.  Neurological:     Mental Status: He is alert.     Comments: Decreased grip strength on the left as compared to right, decreased sensation on the left hand as compared to right primarily in the dorsum of the hand in the ulnar sort of nerve distribution.  Able to lift arms off of bed legs off of bed.  No cranial nerve  deficits.  Psychiatric:        Mood and Affect: Mood normal.        Behavior: Behavior normal.     (all labs ordered are listed, but only abnormal results are displayed) Labs Reviewed  CBC - Abnormal; Notable for the following components:      Result Value   Platelets 111 (*)    All other components within normal limits  COMPREHENSIVE METABOLIC PANEL WITH GFR - Abnormal; Notable for the following components:   Sodium 133 (*)    Glucose, Bld 192 (*)    AST 170 (*)    ALT 169 (*)    Alkaline Phosphatase 152 (*)    All other components within normal limits  I-STAT CHEM 8, ED - Abnormal; Notable for the following components:   Glucose, Bld 189 (*)    Calcium , Ion 1.11 (*)    All other components within normal limits  CBG MONITORING, ED - Abnormal; Notable for the following components:   Glucose-Capillary 190 (*)    All other components within normal limits  PROTIME-INR  APTT  DIFFERENTIAL  ETHANOL  RAPID URINE DRUG SCREEN, HOSP PERFORMED    EKG: None  Radiology: MR BRAIN WO CONTRAST Result Date: 10/07/2024 EXAM: MRI BRAIN WITHOUT CONTRAST MRI CERVICAL SPINE WITHOUT CONTRAST 10/07/2024 09:07:35 PM TECHNIQUE: Multiplanar multisequence MRI of the brain was performed without the administration of intravenous contrast. Multiplanar multisequence MRI of the cervical spine was performed without the administration of intravenous contrast. COMPARISON: Brain MRI 02/27/2015. CLINICAL HISTORY: Neuro deficit, acute, stroke suspected. FINDINGS: MRI BRAIN: BRAIN AND VENTRICLES: Multifocal early subacute ischemia at the right frontoparietal junction affecting the precentral and postcentral gyri. Test magnetic susceptibility artifact in the region of the ischemia, which could represent a small amount of distal thromboembolic material. Multifocal hyperintense T2-weighted signal within the cerebral white matter, most commonly due to chronic small vessel disease. No acute intracranial hemorrhage. No  mass or abnormal enhancement. No midline shift. No hydrocephalus. The sella is unremarkable. Normal flow voids. ORBITS: No acute abnormality. SINUSES AND MASTOIDS: No acute abnormality. BONES AND SOFT TISSUES: Normal bone marrow signal. No acute soft tissue abnormality. MRI CERVICAL SPINE: BONES AND ALIGNMENT: Normal alignment. Normal vertebral body heights. Marrow signal is unremarkable. No abnormal enhancement. SPINAL CORD: Normal spinal cord size. Normal spinal cord signal. SOFT TISSUES: Unremarkable. C2-C3: Small left subarticular disc protrusion with bilateral uncovertebral spurring. No central spinal canal or neural foraminal stenosis. C3-C4: No significant disc herniation. No spinal canal stenosis or neural foraminal narrowing. C4-C5: No significant disc herniation. No spinal canal stenosis or neural  foraminal narrowing. C5-C6: No significant disc herniation. No spinal canal stenosis or neural foraminal narrowing. C6-C7: No significant disc herniation. No spinal canal stenosis or neural foraminal narrowing. C7-T1: No significant disc herniation. No spinal canal stenosis or neural foraminal narrowing. IMPRESSION: 1. No acute intracranial abnormality. 2. Multifocal early subacute ischemia at the right frontoparietal junction affecting the precentral and postcentral gyri. 3. Multifocal cerebral white matter T2 hyperintensities most consistent with chronic small vessel disease. 4. C2-C3 small left subarticular disc protrusion with bilateral uncovertebral spurring, without central spinal canal or neural foraminal stenosis. Electronically signed by: Franky Stanford MD 10/07/2024 10:02 PM EST RP Workstation: [id number]   MR CERVICAL SPINE WO CONTRAST Result Date: 10/07/2024 EXAM: MRI BRAIN WITHOUT CONTRAST MRI CERVICAL SPINE WITHOUT CONTRAST 10/07/2024 09:07:35 PM TECHNIQUE: Multiplanar multisequence MRI of the brain was performed without the administration of intravenous contrast. Multiplanar multisequence MRI of  the cervical spine was performed without the administration of intravenous contrast. COMPARISON: Brain MRI 02/27/2015. CLINICAL HISTORY: Neuro deficit, acute, stroke suspected. FINDINGS: MRI BRAIN: BRAIN AND VENTRICLES: Multifocal early subacute ischemia at the right frontoparietal junction affecting the precentral and postcentral gyri. Test magnetic susceptibility artifact in the region of the ischemia, which could represent a small amount of distal thromboembolic material. Multifocal hyperintense T2-weighted signal within the cerebral white matter, most commonly due to chronic small vessel disease. No acute intracranial hemorrhage. No mass or abnormal enhancement. No midline shift. No hydrocephalus. The sella is unremarkable. Normal flow voids. ORBITS: No acute abnormality. SINUSES AND MASTOIDS: No acute abnormality. BONES AND SOFT TISSUES: Normal bone marrow signal. No acute soft tissue abnormality. MRI CERVICAL SPINE: BONES AND ALIGNMENT: Normal alignment. Normal vertebral body heights. Marrow signal is unremarkable. No abnormal enhancement. SPINAL CORD: Normal spinal cord size. Normal spinal cord signal. SOFT TISSUES: Unremarkable. C2-C3: Small left subarticular disc protrusion with bilateral uncovertebral spurring. No central spinal canal or neural foraminal stenosis. C3-C4: No significant disc herniation. No spinal canal stenosis or neural foraminal narrowing. C4-C5: No significant disc herniation. No spinal canal stenosis or neural foraminal narrowing. C5-C6: No significant disc herniation. No spinal canal stenosis or neural foraminal narrowing. C6-C7: No significant disc herniation. No spinal canal stenosis or neural foraminal narrowing. C7-T1: No significant disc herniation. No spinal canal stenosis or neural foraminal narrowing. IMPRESSION: 1. No acute intracranial abnormality. 2. Multifocal early subacute ischemia at the right frontoparietal junction affecting the precentral and postcentral gyri. 3.  Multifocal cerebral white matter T2 hyperintensities most consistent with chronic small vessel disease. 4. C2-C3 small left subarticular disc protrusion with bilateral uncovertebral spurring, without central spinal canal or neural foraminal stenosis. Electronically signed by: Franky Stanford MD 10/07/2024 10:02 PM EST RP Workstation: [id number]   CT HEAD CODE STROKE WO CONTRAST (LKW 0-4.5h, LVO 0-24h) Result Date: 10/07/2024 EXAM: CT HEAD WITHOUT CONTRAST 10/07/2024 07:50:42 PM TECHNIQUE: CT of the head was performed without the administration of intravenous contrast. Automated exposure control, iterative reconstruction, and/or weight based adjustment of the mA/kV was utilized to reduce the radiation dose to as low as reasonably achievable. COMPARISON: Brain MRI 02/27/2015. CLINICAL HISTORY: Neuro deficit, acute, stroke suspected. FINDINGS: BRAIN AND VENTRICLES: No acute hemorrhage. No evidence of acute infarct. Old infarcts of the right precentral gyrus and right basal ganglia. Mild chronic ischemic white matter changes. No hydrocephalus. No extra-axial collection. No mass effect or midline shift. [place] Stroke Program Early CT Score (ASPECTS): Ganglionic (caudate, internal capsule, lentiform nucleus, insula, M1-M3): 7. Supraganglionic (M4-M6): 3. Total: 10. ORBITS: No acute abnormality. SINUSES: No acute  abnormality. SOFT TISSUES AND SKULL: No acute soft tissue abnormality. No skull fracture. IMPRESSION: 1. No acute intracranial abnormality. ASPECTS: 10 2. Old infarcts of the right precentral gyrus and right basal ganglia. 3. Mild chronic ischemic white matter changes. Findings communicated to Dr. Camellia Shark at 7:59 pm on 10/07/2024. Electronically signed by: Franky Stanford MD 10/07/2024 08:00 PM EST RP Workstation: [id number]     Procedures   Medications Ordered in the ED - No data to display  Clinical Course as of 10/07/24 2248  Wed Oct 07, 2024  1938 Code stroke activated. L arm weakness. LKW 6pm [AF]   2051 Case discussed with neurology plan for MRI.  Discussed findings after resulted for disposition. [TY]  2214 MR BRAIN WO CONTRAST IMPRESSION: 1. No acute intracranial abnormality. 2. Multifocal early subacute ischemia at the right frontoparietal junction affecting the precentral and postcentral gyri. 3. Multifocal cerebral white matter T2 hyperintensities most consistent with chronic small vessel disease. 4. C2-C3 small left subarticular disc protrusion with bilateral uncovertebral spurring, without central spinal canal or neural foraminal stenosis.  Electronically signed by: Franky Stanford MD 10/07/2024 10:02 PM EST RP Workstation: [id number]   [TY]  2248 Spoke with hospitalist for admission.  [TY]    Clinical Course User Index [AF] Veta Palma, PA-C [TY] Neysa Caron PARAS, DO                                 Medical Decision Making 58 year old male complex past medical history to include hypertension, obesity, prior stroke, low back pain, CAD, presented with left hand weakness.  He is afebrile nontachycardic, hemodynamically stable.  Low NIH also with prior strokes.  Not tPA candidate.  CT head without obvious pathology.  Questionable peripheral nerve given its distribution closely follows the ulnar nerve.  MRI cervical spine obtained as well as MRI brain as per recommended by neurology, Dr. Shark.  Neurology has reviewed images and recommending admission for stroke workup as it appears he does have a new stroke.  Recommending aspirin  and Plavix .  Amount and/or Complexity of Data Reviewed External Data Reviewed:     Details: On baby aspirin  Labs:     Details: Labs with mild hyponatremia otherwise no significant abnormalities. Radiology:  Decision-making details documented in ED Course.    Details: No obvious intracranial pathology on CT head on my independent review.  Risk Decision regarding hospitalization. Diagnosis or treatment significantly limited by social  determinants of health. Risk Details: Poor health literacy       Final diagnoses:  None    ED Discharge Orders     None          Neysa Caron PARAS, DO 10/07/24 2237

## 2024-10-07 NOTE — Subjective & Objective (Signed)
 LKN 6pm got out of shower noted left hand and finger weakness, could not get tower Had some tingling No other deficits Ct head non-acute MRI cervical spine and MRI brain  Multifocal early subacute ischemia at the right frontoparietal junction affecting the precentral and postcentral gyri.   Neurology has been consulted recommended starting aspirin  and Plavix  and admit for stroke work up

## 2024-10-07 NOTE — ED Triage Notes (Signed)
 POV ambulatory/ left hand weakness/ LKW- 6:00pm/ hx of stroke/ no other deficits/ provider at bedside/ A&OX4

## 2024-10-07 NOTE — Assessment & Plan Note (Signed)
 Order ssi hold po meds

## 2024-10-07 NOTE — Consult Note (Signed)
 NEUROLOGY CONSULT NOTE   Date of service: October 07, 2024 Patient Name: ZEBULEN SIMONIS MRN:  [id number] DOB:  01-Oct-1966 Chief Complaint: Acute onset of left hand numbness and weakness. Requesting Provider: Neysa Caron PARAS, DO  History of Present Illness  STEPHANIE MCGLONE is a 58 y.o. 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 He states the symptoms started when he got out of the shower and tried to pick up a glass with his left hand - the glass kept slipping out. He also had numbness of the fingers of his left hand. There was no weakness at the level of the elbow or shoulder. No left leg weakness or numbness was noted at that time. He also denies any facial weakness, facial numbness, new vision changes, dysarthria, confusion or trouble talking. No difficulty ambulating.   LKW: 1800 Modified rankin score: 1-No significant post stroke disability and can perform usual duties with stroke symptoms IV Thrombolysis/EVT: No: Symptoms too mild to treat; suspected peripheral nerve lesion   NIHSS components Score: Comment  1a Level of Conscious 0[x]  1[]  2[]  3[]      1b LOC Questions 0[x]  1[]  2[]       1c LOC Commands 0[x]  1[]  2[]       2 Best Gaze 0[x]  1[]  2[]       3 Visual 0[]  1[x]  2[]  3[]     Superior altitudinal defect OD. Visual fields in left eye are intact.   4 Facial Palsy 0[x]  1[]  2[]  3[]      5a Motor Arm - left 0[x]  1[]  2[]  3[]  4[]  UN[]   No drift. Weakness of thumb opposition and /flexion. Weakness of index finger flexion. Strength of flexion extension is intact at the wrist and elbow. Deltoid normal.   5b Motor Arm - Right 0[x]  1[]  2[]  3[]  4[]  UN[]   Normal strength against resistance.   6a Motor Leg - Left 0[x]   1[]  2[]  3[]  4[]  UN[]    6b Motor Leg - Right 0[x]  1[]  2[]  3[]  4[]  UN[]    7 Limb Ataxia 0[x]  1[]  2[]  UN[]      8 Sensory 0[]  1[x]  2[]  UN[]     Decreased sensation to left pinky, ring finger and medial aspect of middle finger dorsally and ventrally. Also with numbness to medial portion of left palm. Other regions of the hand including the dorsum are spared. LUE above wrist also spared. No numbness of face or the other limbs.   9 Best Language 0[x]  1[]  2[]  3[]      10 Dysarthria 0[x]  1[]  2[]  UN[]      11 Extinct. and Inattention 0[x]  1[]  2[]       TOTAL:  2      ROS  Comprehensive ROS performed and pertinent positives documented in HPI   Past History   Past Medical History:  Diagnosis Date   Arthritis    CAD (coronary artery disease)    a. NSTEMI: 100% stenosis of the distal RCA --> DES placed 5/17 CLEARED BY CARDIOLOGIST   Chronic low back pain    a. s/p back surgery.   CVA (cerebral infarction)    a. 02/2015 dyarthria 2/2 CVA involving the lateral aspect of the precentral gyrus.   DM2 (diabetes mellitus, type 2) (HCC)    a. pre-diabetic in  the past. b. A1c 03/2016 elevated to 7.9.   ETOH abuse    GERD (gastroesophageal reflux disease)    History of echocardiogram    a. Mild LVH, EF 55-60%, Definity  contrast used-LV wall motion could not be adequately assessed   History of non-ST elevation myocardial infarction (NSTEMI) 03/2016   a. PCI: 3.5 x 24 mm Promus Premier DES to distal RCA   History of tobacco abuse    Hypertension    a. 02/2015 echo: 55-65%, trace TR/MR. b. 03/2016: echo with EF of 55-60%.    Myocardial infarction [facility])    Obesity    Ruptured disk    Stroke [facility])    2016   Past Surgical History:  Procedure Laterality Date   CARDIAC CATHETERIZATION  05/2004   minimal CAD   CARDIAC CATHETERIZATION N/A 03/10/2016   Procedure: Left Heart Cath and Coronary Angiography;  Surgeon: Lonni JONETTA Cash, MD;  Location: [facility] INVASIVE CV LAB;  Service: Cardiovascular;  Laterality:  N/A;   CARDIAC CATHETERIZATION N/A 03/10/2016   Procedure: Coronary Stent Intervention;  Surgeon: Lonni JONETTA Cash, MD;  Location: [facility] INVASIVE CV LAB;  Service: Cardiovascular;  Laterality: N/A;   CATARACT EXTRACTION W/PHACO Left 07/12/2016   Procedure: CATARACT EXTRACTION PHACO AND INTRAOCULAR LENS PLACEMENT (IOC);  Surgeon: Adine Oneil Novak, MD;  Location: [facility] ORS;  Service: Ophthalmology;  Laterality: Left;  US   00:50 AP% 9.2 CDE 4.63 fluid pack lot # [id number]   CATARACT EXTRACTION W/PHACO Right 10/23/2021   Procedure: CATARACT EXTRACTION PHACO AND INTRAOCULAR LENS PLACEMENT (IOC) RIGHT DIABETIC;  Surgeon: Novak Adine Oneil, MD;  Location[facility] SURGERY [facility];  Service: Ophthalmology;  Laterality: Right;  Diabetic 1.15 00:16.4   CORONARY ANGIOPLASTY     STENT 5/17   LUMBAR FUSION  2000   ruptured disk, L5   SHOULDER ARTHROSCOPY WITH ROTATOR CUFF REPAIR Left    TOTAL HIP ARTHROPLASTY Left 06/03/2019   Procedure: TOTAL HIP ARTHROPLASTY;  Surgeon: Mardee Lynwood SQUIBB, MD;  Location: [facility] ORS;  Service: Orthopedics;  Laterality: Left;   TOTAL HIP ARTHROPLASTY Right 03/30/2020   Procedure: TOTAL HIP ARTHROPLASTY;  Surgeon: Mardee Lynwood SQUIBB, MD;  Location: [facility] ORS;  Service: Orthopedics;  Laterality: Right;  posterior     Family History: Family History  Problem Relation Age of Onset   Dementia Mother    Hypertension Father    Heart disease Father        MI   Heart attack Father    Lung cancer Maternal Grandmother    Heart failure Paternal Grandfather     Social History  reports that he quit smoking about 9 years ago. His smoking use included cigarettes. He has never used smokeless tobacco. He reports current alcohol use of about 12.0 standard drinks of alcohol per week. He reports that he does not use drugs.  Allergies  Allergen Reactions   Ace Inhibitors Cough    Medications  No current facility-administered medications for this encounter.  Current Outpatient  Medications:    amLODipine  (NORVASC ) 5 MG tablet, Take 1 tablet (5 mg total) by mouth daily., Disp: 90 tablet, Rfl: 3   aspirin  EC 81 MG tablet, Take 81 mg by mouth daily. Swallow whole., Disp: , Rfl:    celecoxib  (CELEBREX ) 200 MG capsule, Take 200 mg by mouth daily., Disp: , Rfl:    cephALEXin  (KEFLEX ) 500 MG capsule, Take 1 capsule (500 mg total) by mouth 3 (three) times daily. (Patient not taking: Reported on 09/18/2024), Disp: 21 capsule, Rfl: 0  cyanocobalamin  (VITAMIN B12) 1000 MCG tablet, Take 1,000 mcg by mouth daily., Disp: , Rfl:    Evolocumab  (REPATHA  SURECLICK) 140 MG/ML SOAJ, Inject 140 mg into the skin every 14 (fourteen) days., Disp: 6 mL, Rfl: 1   glipiZIDE  (GLUCOTROL  XL) 2.5 MG 24 hr tablet, TAKE 1 TABLET BY MOUTH EVERY DAY WITH BREAKFAST, Disp: 90 tablet, Rfl: 1   hydrochlorothiazide  (HYDRODIURIL ) 25 MG tablet, Take 0.5 tablets (12.5 mg total) by mouth daily., Disp: 45 tablet, Rfl: 3   metFORMIN  (GLUCOPHAGE ) 1000 MG tablet, Take 1 tablet (1,000 mg total) by mouth 2 (two) times daily with a meal., Disp: 180 tablet, Rfl: 3   nitroGLYCERIN  (NITROSTAT ) 0.4 MG SL tablet, Place 1 tablet (0.4 mg total) under the tongue every 5 (five) minutes as needed for chest pain (CP or SOB)., Disp: 25 tablet, Rfl: 3   sildenafil  (VIAGRA ) 100 MG tablet, TAKE 1/2 - 1 TABLET BY MOUTH DAILY AS NEEDED FOR ERECTILE DYSFUNCTION, Disp: 5 tablet, Rfl: 5   sulfamethoxazole -trimethoprim  (BACTRIM  DS) 800-160 MG tablet, Take 1 tablet by mouth 2 (two) times daily., Disp: 14 tablet, Rfl: 0   tirzepatide  (MOUNJARO ) 12.5 MG/0.5ML Pen, Inject 12.5 mg into the skin once a week., Disp: 2 mL, Rfl: 3   valsartan  (DIOVAN ) 80 MG tablet, Take 1 tablet (80 mg total) by mouth daily., Disp: 90 tablet, Rfl: 3  Vitals   Vitals:   10/07/24 1938 10/07/24 1944  BP:  (!) 143/80  Pulse: 74   Resp: 16   Temp: 98.2 F (36.8 C)   TempSrc: Oral   SpO2: 98%   Weight: 115.2 kg     Body mass index is 38.62 kg/m.   Physical  Exam   Constitutional: Appears well-developed and well-nourished. Morbid obesity is noted.  Psych: Affect appropriate to situation.  Eyes: No scleral injection.  HENT: No OP obstruction.  Head: Normocephalic.  Respiratory: Effort normal, non-labored breathing.  Skin: No rash to face or distal BUE.   Neurologic Examination   See NIHSS  Labs/Imaging/Neurodiagnostic studies   CBC: No results for input(s): WBC, NEUTROABS, HGB, HCT, MCV, PLT in the last 168 hours. Basic Metabolic Panel:  Lab Results  Component Value Date   NA 136 04/10/2024   K 4.7 04/10/2024   CO2 30 04/10/2024   GLUCOSE 137 (H) 04/10/2024   BUN 17 04/10/2024   CREATININE 0.99 04/10/2024   CALCIUM  9.5 04/10/2024   GFRNONAA >60 09/26/2023   GFRAA >60 03/24/2020   Lipid Panel:  Lab Results  Component Value Date   LDLCALC 49 04/10/2024   HgbA1c:  Lab Results  Component Value Date   HGBA1C 6.1 (A) 04/10/2024   Urine Drug Screen: No results found for: LABOPIA, COCAINSCRNUR, LABBENZ, AMPHETMU, THCU, LABBARB  Alcohol Level No results found for: [facility] INR  Lab Results  Component Value Date   INR 0.9 08/28/2024   APTT  Lab Results  Component Value Date   APTT 33.6 02/07/2024     ASSESSMENT  Freedom Peddy Stockley is a 58 y.o. 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 He states the symptoms started when he got out of the shower and tried to pick up a glass with his left hand - the glass kept  slipping out. He also had numbness of the fingers of his left hand. There was no weakness at the level of the elbow or shoulder. No left leg weakness or numbness was noted at that time. He also denies any facial  weakness, facial numbness, new vision changes, dysarthria, confusion or trouble talking. No difficulty ambulating.  - Exam reveals isolated left hand numbness and weakness. No other focal deficits appreciated. NIHSS 2.  - CT head: No acute intracranial abnormality. ASPECTS: 10. Old infarcts of the right precentral gyrus and right basal ganglia. Mild chronic ischemic white matter changes. - Impression: Acute onset of left hand weakness and sensory numbness. DDx includes a small stroke involving the hand region of the right motor strip versus a radiculopathy versus ulnar and median nerve lesions.   RECOMMENDATIONS  - MRI brain and C-spine (ordered) - Frequent neuro checks - Continue home ASA - Labs essentially unremarkable from a stroke standpoint. Elevated glucose of 189. Ionized C low at 1.11. Alk phos, AST and ALT are elevated. Normal BUN and Cr. WBC normal. Coags normal. UDS and EtOH normal.  - Further recommendations after MRI is completed  Addendum: - MRI brain: Multifocal early subacute ischemia at the right frontoparietal junction affecting the precentral and postcentral gyri. Multifocal cerebral white matter T2 hyperintensities most consistent with chronic small vessel disease.  - MRI C-spine: C2-C3 small left subarticular disc protrusion with bilateral uncovertebral spurring, without central spinal canal or neural foraminal stenosis. - Will need admission for CVA work up: - HgbA1c, fasting lipid panel - PT consult, OT consult, Speech consult - Echocardiogram - CTA of head and neck - Cardiac telemetry - Glycemic control - Atorvastatin  40 mg po every day. Obtain baseline CK level.  - Add Plavix  to ASA - Risk factor modification - Frequent neuro checks - NPO until passes stroke swallow screen - Permissive HTN x 24 hours   ______________________________________________________________________   Bonney SHARK, Morrison Mcbryar, MD [facility]

## 2024-10-07 NOTE — Assessment & Plan Note (Signed)
-   will admit based on TIA/CVA protocol,        Monitor on Tele       /MRI   Resulted - showing acute ischemic CVA        CTA ordered No LVO        Echo to evaluate for possible embolic source,        obtain cardiac enzymes,  ECG,   Lipid panel, TSH.        Order PT/OT evaluation.        keep nothing by mouth until passes swallow eval         Will make sure patient is on antiplatelet ASA   + Plavix  agent and statin  Check CK prior to starting statin       Allow permissive Hypertension keep BP <220/120        Neurology consulted Have seen pt in ER

## 2024-10-08 ENCOUNTER — Observation Stay (HOSPITAL_COMMUNITY)

## 2024-10-08 ENCOUNTER — Other Ambulatory Visit: Payer: Self-pay | Admitting: Cardiology

## 2024-10-08 ENCOUNTER — Other Ambulatory Visit (HOSPITAL_COMMUNITY): Payer: Self-pay

## 2024-10-08 DIAGNOSIS — R2990 Unspecified symptoms and signs involving the nervous system: Principal | ICD-10-CM

## 2024-10-08 DIAGNOSIS — I2510 Atherosclerotic heart disease of native coronary artery without angina pectoris: Secondary | ICD-10-CM

## 2024-10-08 DIAGNOSIS — I639 Cerebral infarction, unspecified: Secondary | ICD-10-CM

## 2024-10-08 DIAGNOSIS — Z87891 Personal history of nicotine dependence: Secondary | ICD-10-CM | POA: Diagnosis not present

## 2024-10-08 DIAGNOSIS — E785 Hyperlipidemia, unspecified: Secondary | ICD-10-CM

## 2024-10-08 DIAGNOSIS — E1165 Type 2 diabetes mellitus with hyperglycemia: Secondary | ICD-10-CM

## 2024-10-08 DIAGNOSIS — E1169 Type 2 diabetes mellitus with other specified complication: Secondary | ICD-10-CM

## 2024-10-08 DIAGNOSIS — E1149 Type 2 diabetes mellitus with other diabetic neurological complication: Secondary | ICD-10-CM | POA: Diagnosis not present

## 2024-10-08 DIAGNOSIS — E119 Type 2 diabetes mellitus without complications: Secondary | ICD-10-CM

## 2024-10-08 LAB — ECHOCARDIOGRAM COMPLETE
Area-P 1/2: 3.72 cm2
Calc EF: 61.5 %
Height: 69 in
S' Lateral: 3.2 cm
Single Plane A2C EF: 65.7 %
Single Plane A4C EF: 57.2 %
Weight: 4064 [oz_av]

## 2024-10-08 LAB — CBG MONITORING, ED
Glucose-Capillary: 117 mg/dL — ABNORMAL HIGH (ref 70–99)
Glucose-Capillary: 137 mg/dL — ABNORMAL HIGH (ref 70–99)
Glucose-Capillary: 141 mg/dL — ABNORMAL HIGH (ref 70–99)
Glucose-Capillary: 144 mg/dL — ABNORMAL HIGH (ref 70–99)

## 2024-10-08 LAB — LIPID PANEL
Cholesterol: 104 mg/dL (ref 0–200)
HDL: 39 mg/dL — ABNORMAL LOW (ref >40)
LDL Cholesterol: 57 mg/dL (ref 0–99)
Total CHOL/HDL Ratio: 2.7 ratio
Triglycerides: 42 mg/dL (ref <150)
VLDL: 8 mg/dL (ref 0–40)

## 2024-10-08 LAB — PHOSPHORUS: Phosphorus: 3.4 mg/dL (ref 2.5–4.6)

## 2024-10-08 LAB — COMPREHENSIVE METABOLIC PANEL WITH GFR
ALT: 142 U/L — ABNORMAL HIGH (ref 0–44)
AST: 124 U/L — ABNORMAL HIGH (ref 15–41)
Albumin: 3.1 g/dL — ABNORMAL LOW (ref 3.5–5.0)
Alkaline Phosphatase: 110 U/L (ref 38–126)
Anion gap: 8 (ref 5–15)
BUN: 15 mg/dL (ref 6–20)
CO2: 24 mmol/L (ref 22–32)
Calcium: 9.1 mg/dL (ref 8.9–10.3)
Chloride: 103 mmol/L (ref 98–111)
Creatinine, Ser: 1.02 mg/dL (ref 0.61–1.24)
GFR, Estimated: >60 mL/min (ref >60)
Glucose, Bld: 132 mg/dL — ABNORMAL HIGH (ref 70–99)
Potassium: 4.2 mmol/L (ref 3.5–5.1)
Sodium: 135 mmol/L (ref 135–145)
Total Bilirubin: 0.9 mg/dL (ref 0.0–1.2)
Total Protein: 6.4 g/dL — ABNORMAL LOW (ref 6.5–8.1)

## 2024-10-08 LAB — CK: Total CK: 85 U/L (ref 49–397)

## 2024-10-08 LAB — CBC
HCT: 42.1 % (ref 39.0–52.0)
Hemoglobin: 14.1 g/dL (ref 13.0–17.0)
MCH: 32 pg (ref 26.0–34.0)
MCHC: 33.5 g/dL (ref 30.0–36.0)
MCV: 95.7 fL (ref 80.0–100.0)
Platelets: 93 K/uL — ABNORMAL LOW (ref 150–400)
RBC: 4.4 MIL/uL (ref 4.22–5.81)
RDW: 12.7 % (ref 11.5–15.5)
WBC: 6.4 K/uL (ref 4.0–10.5)
nRBC: 0 % (ref 0.0–0.2)

## 2024-10-08 LAB — MAGNESIUM
Magnesium: 1.6 mg/dL — ABNORMAL LOW (ref 1.7–2.4)
Magnesium: 1.9 mg/dL (ref 1.7–2.4)

## 2024-10-08 LAB — HEMOGLOBIN A1C
Hgb A1c MFr Bld: 7 % — ABNORMAL HIGH (ref 4.8–5.6)
Mean Plasma Glucose: 154 mg/dL

## 2024-10-08 LAB — HIV ANTIBODY (ROUTINE TESTING W REFLEX): HIV Screen 4th Generation wRfx: NONREACTIVE

## 2024-10-08 MED ORDER — PERFLUTREN LIPID MICROSPHERE
1.0000 mL | INTRAVENOUS | Status: AC | PRN
  Administered 2024-10-08: 2 mL via INTRAVENOUS

## 2024-10-08 MED ORDER — ACETAMINOPHEN 325 MG PO TABS: 650.0000 mg | ORAL_TABLET | ORAL | Status: DC | PRN

## 2024-10-08 MED ORDER — CLOPIDOGREL BISULFATE 75 MG PO TABS
75.0000 mg | ORAL_TABLET | Freq: Every day | ORAL | Status: DC
  Administered 2024-10-08: 75 mg via ORAL
  Filled 2024-10-08: qty 1

## 2024-10-08 MED ORDER — ACETAMINOPHEN 160 MG/5ML PO SOLN: 650.0000 mg | ORAL | Status: DC | PRN

## 2024-10-08 MED ORDER — SODIUM CHLORIDE 0.9 % IV SOLN: INTRAVENOUS | Status: DC

## 2024-10-08 MED ORDER — MAGNESIUM SULFATE IN D5W 1-5 GM/100ML-% IV SOLN
1.0000 g | Freq: Once | INTRAVENOUS | Status: AC
  Administered 2024-10-08: 1 g via INTRAVENOUS
  Filled 2024-10-08: qty 100

## 2024-10-08 MED ORDER — ASPIRIN 81 MG PO TBEC
81.0000 mg | DELAYED_RELEASE_TABLET | Freq: Every day | ORAL | Status: DC
  Administered 2024-10-08: 81 mg via ORAL
  Filled 2024-10-08: qty 1

## 2024-10-08 MED ORDER — ASPIRIN EC 81 MG PO TBEC
81.0000 mg | DELAYED_RELEASE_TABLET | Freq: Every day | ORAL | 0 refills | Status: AC
  Filled 2024-10-08: qty 21, 21d supply, fill #0

## 2024-10-08 MED ORDER — OXYCODONE HCL 5 MG PO TABS
5.0000 mg | ORAL_TABLET | Freq: Once | ORAL | Status: AC
  Administered 2024-10-08: 5 mg via ORAL
  Filled 2024-10-08: qty 1

## 2024-10-08 MED ORDER — ACETAMINOPHEN 650 MG RE SUPP: 650.0000 mg | RECTAL | Status: DC | PRN

## 2024-10-08 MED ORDER — CLOPIDOGREL BISULFATE 75 MG PO TABS
75.0000 mg | ORAL_TABLET | Freq: Every day | ORAL | 0 refills | Status: AC
  Filled 2024-10-08: qty 30, 30d supply, fill #0

## 2024-10-08 MED ORDER — STROKE: EARLY STAGES OF RECOVERY BOOK: Freq: Once | Status: DC

## 2024-10-08 NOTE — Assessment & Plan Note (Signed)
 Contributing to comorbidity and complicating medical management  Body mass index is 37.51 kg/m.  Nutritional follow up as an out pt would be recommended

## 2024-10-08 NOTE — ED Notes (Signed)
 Echo at bedside

## 2024-10-08 NOTE — Evaluation (Signed)
 Physical Therapy Brief Evaluation and Discharge Note Patient Details Name: Daniel [name] MRN: [id number] DOB: 1966-08-18 Today's Date: 10/08/2024   History of Present Illness  58 yo M adm 10/07/24 with LUE tingling with frontoparietal infarct. PMhx: HTN, BPH, CVA, NSTEMI, Bil THA, T2DM, CAD, HLD, partial Rt eye blindness  Clinical Impression  Pt very pleasant with left lateral hand numbness, decreased fine motor and dexterity of left hand. 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 Pt able to complete basic transfers, gait and functional mobility without deficit and is used to his partial Rt eye blindness from prior stroke. Pt at baseline mobility and no further acute therapy needs but will defer to OT for Left hand. Pt educated for stroke signs and symptoms with BE FAST. Will sign off.        PT Assessment Patient does not need any further PT services  Assistance Needed at Discharge  PRN    Equipment Recommendations None recommended by PT  Recommendations for Other Services       Precautions/Restrictions Precautions Precautions: None        Mobility  Bed Mobility   Supine/Sidelying to sit: Modified independent (Device/Increased time) Sit to supine/sidelying: Modified independent (Device/Increased time)    Transfers Overall transfer level: Modified independent                      Ambulation/Gait Ambulation/Gait assistance: Modified independent (Device/Increase time) Gait Distance (Feet): 150 Feet Assistive device: None Gait Pattern/deviations: WFL(Within Functional Limits) Gait Speed: Pace WFL    Home Activity Instructions    Stairs            Modified Rankin (Stroke Patients Only) Modified Rankin (Stroke Patients Only) Pre-Morbid Rankin Score: No significant disability Modified Rankin: Slight disability      Balance Overall balance assessment: Mild deficits observed, not formally tested                         Pertinent Vitals/Pain PT - Brief Vital Signs All Vital Signs Stable: Yes Pain Assessment Pain Assessment: No/denies pain     Home Living Family/patient expects to be discharged to:: Private residence Living Arrangements: Spouse/significant other Available Help at Discharge: Family Home Environment: Stairs to enter  Landscape Architect of Steps: 4 Home Equipment: None        Prior Function Level of Independence: Independent      UE/LE Assessment   UE ROM/Strength/Tone/Coordination: Impaired UE ROM/Strength/Tone/Coordination Deficits: decreased sensation lateral hand and impaired fine motor as well as finger to thumb  LE ROM/Strength/Tone/Coordination: [facility]      Communication   Communication Communication: No apparent difficulties     Cognition Overall Cognitive Status: Appears within functional limits for tasks assessed/performed       General Comments      Exercises     Assessment/Plan    PT Problem List         PT Visit Diagnosis Other abnormalities of gait and mobility (R26.89)    No Skilled PT All education completed;Other (comment) (defer to OT)   Co-evaluation                AMPAC 6 Clicks Help needed turning from your back to your side while in a flat bed without using bedrails?: None Help needed moving from lying on your back to sitting on the side of a flat bed without using bedrails?: None Help needed moving to and from a  bed to a chair (including a wheelchair)?: None Help needed standing up from a chair using your arms (e.g., wheelchair or bedside chair)?: None Help needed to walk in hospital room?: None Help needed climbing 3-5 steps with a railing? : None 6 Click Score: 24      End of Session   Activity Tolerance: Patient tolerated treatment well Patient left: in bed;with call bell/phone within reach Nurse Communication: Mobility status PT Visit Diagnosis: Other abnormalities of gait and mobility (R26.89)     Time:  9088-9074 PT Time Calculation (min) (ACUTE ONLY): 14 min  Charges:   PT Evaluation $PT Eval Low Complexity: 1 Low      Annitta Fifield P, PT Acute Rehabilitation Services Office: [phone]   Lenoard NOVAK Aquiles Ruffini  10/08/2024, 11:29 AM

## 2024-10-08 NOTE — H&P (Signed)
 Daniel [name] FMW:[id number] DOB: September 23, 1966 DOA: 10/07/2024     PCP: Randeen Laine LABOR, MD   Outpatient Specialists:   CARDS:   Dr. Lonni Cash, MD    Patient arrived to ER on 10/07/24 at 1928 Referred by Attending Silvester Ales, MD   Patient coming from:    home Lives With family     Chief Complaint:  Chief Complaint  Patient presents with   Weakness    HPI: Daniel [name] is a 58 y.o. male with medical history significant of prior history of stroke, CAD, HLD, HTN, DM2,    Presented with left arm tingling and left forearm and index finger weakness LKN 6pm got out of shower noted left hand and finger weakness, could not get tower Had some tingling No other deficits Ct head non-acute MRI cervical spine and MRI brain  Multifocal early subacute ischemia at the right frontoparietal junction affecting the precentral and postcentral gyri.   Neurology has been consulted recommended starting aspirin  and Plavix  and admit for stroke work up  No recent chest pain no shortness of breath has been compliant his home medications  Denies significant ETOH intake drinks beer on weekends Does not smoke   Regarding pertinent Chronic problems:     Hyperlipidemia -  on Repatha  Lipid Panel     Component Value Date/Time   CHOL 105 04/10/2024 1022   CHOL 150 02/27/2015 0645   TRIG 58.0 04/10/2024 1022   TRIG 98 02/27/2015 0645   HDL 44.40 04/10/2024 1022   HDL 34 (L) 02/27/2015 0645   CHOLHDL 2 04/10/2024 1022   VLDL 11.6 04/10/2024 1022   VLDL 20 02/27/2015 0645   LDLCALC 49 04/10/2024 1022   LDLCALC 96 02/27/2015 0645     HTN on Norvasc , hydrochlorothiazide  Diovan    last echo  Recent Results (from the past 56199 hours)  ECHOCARDIOGRAM COMPLETE   Collection Time: 02/14/24  9:26 AM  Result Value   S' Lateral 2.90   Area-P 1/2 2.95   Est EF 55 - 60%   Narrative      ECHOCARDIOGRAM REPORT          1. Left ventricular ejection fraction, by estimation,  is 55 to 60%. The left ventricle has normal function. The left ventricle has no regional wall motion abnormalities. There is mild left ventricular hypertrophy of the infero-lateral segment. Left  ventricular diastolic parameters were normal.  2. Right ventricular systolic function is normal. The right ventricular size is mildly enlarged. Tricuspid regurgitation signal is inadequate for assessing PA pressure.  3. The mitral valve is normal in structure. Trivial mitral valve regurgitation. No evidence of mitral stenosis.  4. The aortic valve is tricuspid. Aortic valve regurgitation is not visualized. Aortic valve sclerosis/calcification is present, without any evidence of aortic stenosis.  5. The inferior vena cava is normal in size with greater than 50% respiratory variability, suggesting right atrial pressure of 3 mmHg.           CAD  - On Aspirin , statin,                  -  followed by cardiology                       DM 2 -  Lab Results  Component Value Date   HGBA1C 6.1 (A) 04/10/2024   on   PO meds and Mounjaro       Morbid obesity-   BMI Readings from  Last 1 Encounters:  10/07/24 37.51 kg/m         Hx of CVA -  with/  residual deficits on Aspirin  81 mg,      While in ER: Clinical Course as of 10/08/24 0033  Wed Oct 07, 2024  1938 Code stroke activated. L arm weakness. LKW 6pm [AF]  2051 Case discussed with neurology plan for MRI.  Discussed findings after resulted for disposition. [TY]  2214 MR BRAIN WO CONTRAST IMPRESSION: 1. No acute intracranial abnormality. 2. Multifocal early subacute ischemia at the right frontoparietal junction affecting the precentral and postcentral gyri. 3. Multifocal cerebral white matter T2 hyperintensities most consistent with chronic small vessel disease. 4. C2-C3 small left subarticular disc protrusion with bilateral uncovertebral spurring, without central spinal canal or neural foraminal stenosis.  Electronically signed by: Franky Stanford MD 10/07/2024 10:02 PM EST RP Workstation: [id number]   [TY]  2248 Spoke with hospitalist for admission.  [TY]    Clinical Course User Index [AF] Veta Palma, PA-C [TY] Neysa Caron PARAS, DO         Lab Orders         Protime-INR         APTT         CBC         Differential         Comprehensive metabolic panel         Ethanol         Urine rapid drug screen (hosp performed)         CK         Magnesium          Phosphorus         I-stat chem 8, ED         POC CBG, ED         CBG monitoring, ED      CT HEAD   NON acute  Old infarcts of the right precentral gyrus and right basal ganglia.  MRI brain   . Multifocal early subacute ischemia at the right frontoparietal junction affecting the precentral and postcentral gyri.     CTa head neck   -  1. No emergent large vessel occlusion. 2. Mixed-density atherosclerotic disease with approximately 50% stenosis of the right proximal ICA. 3. Calcific atherosclerosis at the left carotid bifurcation and proximal ICA with less than 50% stenosis. 4. Calcific atherosclerosis of both carotid siphons without high-grade stenosis.  Following Medications were ordered in ER: Medications  aspirin  tablet 325 mg (has no administration in time range)  insulin  aspart (novoLOG ) injection 0-9 Units ( Subcutaneous Not Given 10/08/24 0010)  clopidogrel  (PLAVIX ) tablet 300 mg (300 mg Oral Given 10/07/24 2300)  iohexol (OMNIPAQUE) 350 MG/ML injection 75 mL (75 mLs Intravenous Contrast Given 10/07/24 2346)    _______________________________________________________ ER Provider Called:    Neurology    Dr.Lindzen  They Recommend admit to medicine    SEEN in ER     ED Triage Vitals  Encounter Vitals Group     BP 10/07/24 1944 (!) 143/80     Girls Systolic BP Percentile --      Girls Diastolic BP Percentile --      Boys Systolic BP Percentile --      Boys Diastolic BP Percentile --      Pulse Rate 10/07/24 1938 74     Resp 10/07/24  1938 16     Temp 10/07/24 1938 98.2 F (36.8 C)     Temp  Source 10/07/24 1938 Oral     SpO2 10/07/24 1938 98 %     Weight 10/07/24 1938 254 lb (115.2 kg)     Height 10/07/24 2126 5' 9 (1.753 m)     Head Circumference --      Peak Flow --      Pain Score 10/07/24 1938 0     Pain Loc --      Pain Education --      Exclude from Growth Chart --   UFJK(75)@     _________________________________________ Significant initial  Findings: Abnormal Labs Reviewed  CBC - Abnormal; Notable for the following components:      Result Value   Platelets 111 (*)    All other components within normal limits  COMPREHENSIVE METABOLIC PANEL WITH GFR - Abnormal; Notable for the following components:   Sodium 133 (*)    Glucose, Bld 192 (*)    AST 170 (*)    ALT 169 (*)    Alkaline Phosphatase 152 (*)    All other components within normal limits  I-STAT CHEM 8, ED - Abnormal; Notable for the following components:   Glucose, Bld 189 (*)    Calcium , Ion 1.11 (*)    All other components within normal limits  CBG MONITORING, ED - Abnormal; Notable for the following components:   Glucose-Capillary 190 (*)    All other components within normal limits  CBG MONITORING, ED - Abnormal; Notable for the following components:   Glucose-Capillary 117 (*)    All other components within normal limits     ECG: Ordered Personally reviewed and interpreted by me showing: HR : 77 Rhythm: Sinus rhythm Markedly posterior QRS axis Low voltage, precordial leads Consider anterior infarct QTC 418    The recent clinical data is shown below. Vitals:   10/07/24 2030 10/07/24 2126 10/07/24 2200 10/07/24 2300  BP: 126/85  (!) 113/55   Pulse: 79  71   Resp: 17  (!) 22   Temp:    97.8 F (36.6 C)  TempSrc:    Oral  SpO2: 100%  98%   Weight:  115.2 kg    Height:  5' 9 (1.753 m)         WBC     Component Value Date/Time   WBC 7.0 10/07/2024 1940   LYMPHSABS 1.3 10/07/2024 1940   LYMPHSABS 1.8 02/27/2015 0645    MONOABS 0.6 10/07/2024 1940   MONOABS 0.6 02/27/2015 0645   EOSABS 0.5 10/07/2024 1940   EOSABS 0.7 02/27/2015 0645   BASOSABS 0.1 10/07/2024 1940   BASOSABS 0.1 02/27/2015 0645      ___________________________________________________ Recent Labs  Lab 10/07/24 1940 10/07/24 1948  NA 133* 137  K 4.8 4.7  CO2 24  --   GLUCOSE 192* 189*  BUN 14 18  CREATININE 1.17 1.20  CALCIUM  8.9  --     Cr   stable,    Lab Results  Component Value Date   CREATININE 1.20 10/07/2024   CREATININE 1.17 10/07/2024   CREATININE 0.99 04/10/2024    Recent Labs  Lab 10/07/24 1940  AST 170*  ALT 169*  ALKPHOS 152*  BILITOT 1.0  PROT 7.0  ALBUMIN 3.5   Lab Results  Component Value Date   CALCIUM  8.9 10/07/2024   PHOS 3.2 06/13/2009    Plt: Lab Results  Component Value Date   PLT 111 (L) 10/07/2024         Recent Labs  Lab 10/07/24 1940 10/07/24 1948  WBC 7.0  --   NEUTROABS 4.4  --   HGB 15.3 15.6  HCT 45.1 46.0  MCV 94.5  --   PLT 111*  --     HG/HCT stable,     Component Value Date/Time   HGB 15.6 10/07/2024 1948   HGB 15.1 02/27/2015 0645   HCT 46.0 10/07/2024 1948   HCT 43.7 02/27/2015 0645   MCV 94.5 10/07/2024 1940   MCV 97 02/27/2015 0645      No results for input(s): LIPASE, AMYLASE in the last 168 hours. No results for input(s): AMMONIA in the last 168 hours.    _______________________________________________ Hospitalist was called for admission for   Stroke    The following Work up has been ordered so far:  Orders Placed This Encounter  Procedures   CT HEAD CODE STROKE WO CONTRAST (LKW 0-4.5h, LVO 0-24h)   MR BRAIN WO CONTRAST   MR CERVICAL SPINE WO CONTRAST   CT ANGIO HEAD NECK W WO CM   Protime-INR   APTT   CBC   Differential   Comprehensive metabolic panel   Ethanol   Urine rapid drug screen (hosp performed)   CK   Magnesium    Phosphorus   Diet Carb Modified Fluid consistency: Thin; Room service appropriate? Yes   Vital  signs q 2 hours x 12 hours, then q 4 hours   ED Cardiac monitoring   NIH Stroke Scale   Swallow screen   Place X2 Large Bore IV's   Nurse notify provider if SBP > 220/120   If O2 sat <94% Administer O2 @ 2 Liters/Minute   Initiate Carrier Fluid Protocol   Cardiac Monitoring - Continuous Indefinite   Apply Diabetes Mellitus Care Plan   STAT CBG when hypoglycemia is suspected. If treated, recheck every 15 minutes after each treatment until CBG >/= 70 mg/dl   Refer to Hypoglycemia Protocol Sidebar Report for treatment of CBG < 70 mg/dl   Full code   Clerk to Activate Code Stroke   Consult to neurology   Consult to hospitalist   ED Pulse oximetry, continuous   I-stat chem 8, ED   POC CBG, ED   CBG monitoring, ED   ED EKG   EKG 12-Lead   Saline lock IV   Place in observation (patient's expected length of stay will be less than 2 midnights)     OTHER Significant initial  Findings:  labs showing:     DM  labs:  HbA1C: Recent Labs    10/23/23 1036 04/10/24 1004  HGBA1C 9.1* 6.1*       CBG (last 3)  Recent Labs    10/07/24 1943 10/08/24 0006  GLUCAP 190* 117*          Cultures:    Component Value Date/Time   SDES  03/24/2020 1058    URINE, RANDOM Performed at [facility], [address], [place], [place] [address]    [facility]  03/24/2020 1058    Normal Performed at [facility], [address], [place], [place] [address]    CULT  03/24/2020 1058    NO GROWTH Performed at [facility], [address] [place], [place] [address]    REPTSTATUS 03/25/2020 FINAL 03/24/2020 1058     Radiological Exams on Admission: CT ANGIO HEAD NECK W WO CM Result Date: 10/08/2024 EXAM: CTA HEAD AND NECK WITH AND WITHOUT 10/07/2024 11:51:49 PM TECHNIQUE: CTA of the head and neck was performed with and without the administration  of 75 mL of iohexol (OMNIPAQUE) 350 MG/ML injection. Multiplanar 2D and/or 3D reformatted images are provided for review.  Automated exposure control, iterative reconstruction, and/or weight based adjustment of the mA/kV was utilized to reduce the radiation dose to as low as reasonably achievable. Stenosis of the internal carotid arteries measured using NASCET criteria. COMPARISON: None available CLINICAL HISTORY: Neuro deficit, acute, stroke suspected. FINDINGS: CTA NECK: AORTIC ARCH AND ARCH VESSELS: No dissection or arterial injury. No significant stenosis of the brachiocephalic or subclavian arteries. CERVICAL CAROTID ARTERIES: Right carotid bifurcation and proximal ICA: Mixed density atherosclerotic disease with atherosclerotic bifurcation with 50% stenosis of the proximal ICA. Left carotid bifurcation and proximal ICA: Calcific atherosclerosis with less than 50% stenosis. No dissection or arterial injury. CERVICAL VERTEBRAL ARTERIES: No dissection, arterial injury, or significant stenosis. LUNGS AND MEDIASTINUM: Unremarkable. SOFT TISSUES: No acute abnormality. BONES: No acute abnormality. CTA HEAD: ANTERIOR CIRCULATION: Internal carotid arteries: Calcific atherosclerosis of both carotid siphons without high-grade stenosis. Anterior cerebral arteries: No significant stenosis. Middle cerebral arteries: No significant stenosis. No aneurysm. POSTERIOR CIRCULATION: Posterior cerebral arteries: No significant stenosis. Basilar artery: No significant stenosis. Vertebral arteries: No significant stenosis. No aneurysm. OTHER: No dural venous sinus thrombosis on this non-dedicated study. IMPRESSION: 1. No emergent large vessel occlusion. 2. Mixed-density atherosclerotic disease with approximately 50% stenosis of the right proximal ICA. 3. Calcific atherosclerosis at the left carotid bifurcation and proximal ICA with less than 50% stenosis. 4. Calcific atherosclerosis of both carotid siphons without high-grade stenosis. Electronically signed by: Franky Stanford MD 10/08/2024 12:22 AM EST RP Workstation: [id number]   MR BRAIN WO  CONTRAST Addendum Date: 10/08/2024  ADDENDUM #1 ADDENDUM: While the intermediate signal intensity of the frontoparietal infarct is consistent with acute to early subacute ischemia, given the reported timeline of symptoms beginning around 6pm, an acute timeline is certainly possible. The statement No acute intracranial abnormality was included by mistake in the original impression. ---------------------------------------------------- Electronically signed by: Franky Stanford MD 10/08/2024 12:19 AM EST RP Workstation: [id number]   Result Date: 10/08/2024  ORIGINAL REPORT  EXAM: MRI BRAIN WITHOUT CONTRAST MRI CERVICAL SPINE WITHOUT CONTRAST 10/07/2024 09:07:35 PM TECHNIQUE: Multiplanar multisequence MRI of the brain was performed without the administration of intravenous contrast. Multiplanar multisequence MRI of the cervical spine was performed without the administration of intravenous contrast. COMPARISON: Brain MRI 02/27/2015. CLINICAL HISTORY: Neuro deficit, acute, stroke suspected. FINDINGS: MRI BRAIN: BRAIN AND VENTRICLES: Multifocal early subacute ischemia at the right frontoparietal junction affecting the precentral and postcentral gyri. Test magnetic susceptibility artifact in the region of the ischemia, which could represent a small amount of distal thromboembolic material. Multifocal hyperintense T2-weighted signal within the cerebral white matter, most commonly due to chronic small vessel disease. No acute intracranial hemorrhage. No mass or abnormal enhancement. No midline shift. No hydrocephalus. The sella is unremarkable. Normal flow voids. ORBITS: No acute abnormality. SINUSES AND MASTOIDS: No acute abnormality. BONES AND SOFT TISSUES: Normal bone marrow signal. No acute soft tissue abnormality. MRI CERVICAL SPINE: BONES AND ALIGNMENT: Normal alignment. Normal vertebral body heights. Marrow signal is unremarkable. No abnormal enhancement. SPINAL CORD: Normal spinal cord size. Normal spinal cord  signal. SOFT TISSUES: Unremarkable. C2-C3: Small left subarticular disc protrusion with bilateral uncovertebral spurring. No central spinal canal or neural foraminal stenosis. C3-C4: No significant disc herniation. No spinal canal stenosis or neural foraminal narrowing. C4-C5: No significant disc herniation. No spinal canal stenosis or neural foraminal narrowing. C5-C6: No significant disc herniation. No spinal canal stenosis or neural foraminal narrowing. C6-C7:  No significant disc herniation. No spinal canal stenosis or neural foraminal narrowing. C7-T1: No significant disc herniation. No spinal canal stenosis or neural foraminal narrowing. IMPRESSION: 1. No acute intracranial abnormality. 2. Multifocal early subacute ischemia at the right frontoparietal junction affecting the precentral and postcentral gyri. 3. Multifocal cerebral white matter T2 hyperintensities most consistent with chronic small vessel disease. 4. C2-C3 small left subarticular disc protrusion with bilateral uncovertebral spurring, without central spinal canal or neural foraminal stenosis. Electronically signed by: Franky Stanford MD 10/07/2024 10:02 PM EST RP Workstation: [id number]   MR CERVICAL SPINE WO CONTRAST Addendum Date: 10/08/2024  ADDENDUM #1  ADDENDUM: While the intermediate signal intensity of the frontoparietal infarct is consistent with acute to early subacute ischemia, given the reported timeline of symptoms beginning around 6pm, an acute timeline is certainly possible. The statement No acute intracranial abnormality was included by mistake in the original impression. ---------------------------------------------------- Electronically signed by: Franky Stanford MD 10/08/2024 12:19 AM EST RP Workstation: [id number]   Result Date: 10/08/2024   ORIGINAL REPORT  EXAM: MRI BRAIN WITHOUT CONTRAST MRI CERVICAL SPINE WITHOUT CONTRAST 10/07/2024 09:07:35 PM TECHNIQUE: Multiplanar multisequence MRI of the brain was performed without the  administration of intravenous contrast. Multiplanar multisequence MRI of the cervical spine was performed without the administration of intravenous contrast. COMPARISON: Brain MRI 02/27/2015. CLINICAL HISTORY: Neuro deficit, acute, stroke suspected. FINDINGS: MRI BRAIN: BRAIN AND VENTRICLES: Multifocal early subacute ischemia at the right frontoparietal junction affecting the precentral and postcentral gyri. Test magnetic susceptibility artifact in the region of the ischemia, which could represent a small amount of distal thromboembolic material. Multifocal hyperintense T2-weighted signal within the cerebral white matter, most commonly due to chronic small vessel disease. No acute intracranial hemorrhage. No mass or abnormal enhancement. No midline shift. No hydrocephalus. The sella is unremarkable. Normal flow voids. ORBITS: No acute abnormality. SINUSES AND MASTOIDS: No acute abnormality. BONES AND SOFT TISSUES: Normal bone marrow signal. No acute soft tissue abnormality. MRI CERVICAL SPINE: BONES AND ALIGNMENT: Normal alignment. Normal vertebral body heights. Marrow signal is unremarkable. No abnormal enhancement. SPINAL CORD: Normal spinal cord size. Normal spinal cord signal. SOFT TISSUES: Unremarkable. C2-C3: Small left subarticular disc protrusion with bilateral uncovertebral spurring. No central spinal canal or neural foraminal stenosis. C3-C4: No significant disc herniation. No spinal canal stenosis or neural foraminal narrowing. C4-C5: No significant disc herniation. No spinal canal stenosis or neural foraminal narrowing. C5-C6: No significant disc herniation. No spinal canal stenosis or neural foraminal narrowing. C6-C7: No significant disc herniation. No spinal canal stenosis or neural foraminal narrowing. C7-T1: No significant disc herniation. No spinal canal stenosis or neural foraminal narrowing. IMPRESSION: 1. No acute intracranial abnormality. 2. Multifocal early subacute ischemia at the right  frontoparietal junction affecting the precentral and postcentral gyri. 3. Multifocal cerebral white matter T2 hyperintensities most consistent with chronic small vessel disease. 4. C2-C3 small left subarticular disc protrusion with bilateral uncovertebral spurring, without central spinal canal or neural foraminal stenosis. Electronically signed by: Franky Stanford MD 10/07/2024 10:02 PM EST RP Workstation: [id number]   CT HEAD CODE STROKE WO CONTRAST (LKW 0-4.5h, LVO 0-24h) Result Date: 10/07/2024 EXAM: CT HEAD WITHOUT CONTRAST 10/07/2024 07:50:42 PM TECHNIQUE: CT of the head was performed without the administration of intravenous contrast. Automated exposure control, iterative reconstruction, and/or weight based adjustment of the mA/kV was utilized to reduce the radiation dose to as low as reasonably achievable. COMPARISON: Brain MRI 02/27/2015. CLINICAL HISTORY: Neuro deficit, acute, stroke suspected. FINDINGS: BRAIN AND VENTRICLES: No acute hemorrhage. No evidence  of acute infarct. Old infarcts of the right precentral gyrus and right basal ganglia. Mild chronic ischemic white matter changes. No hydrocephalus. No extra-axial collection. No mass effect or midline shift. [place] Stroke Program Early CT Score (ASPECTS): Ganglionic (caudate, internal capsule, lentiform nucleus, insula, M1-M3): 7. Supraganglionic (M4-M6): 3. Total: 10. ORBITS: No acute abnormality. SINUSES: No acute abnormality. SOFT TISSUES AND SKULL: No acute soft tissue abnormality. No skull fracture. IMPRESSION: 1. No acute intracranial abnormality. ASPECTS: 10 2. Old infarcts of the right precentral gyrus and right basal ganglia. 3. Mild chronic ischemic white matter changes. Findings communicated to Dr. Camellia Shark at 7:59 pm on 10/07/2024. Electronically signed by: Franky Stanford MD 10/07/2024 08:00 PM EST RP Workstation: [id number]   _______________________________________________________________________________________________________ Latest   Blood pressure (!) 113/55, pulse 71, temperature 97.8 F (36.6 C), temperature source Oral, resp. rate (!) 22, height 5' 9 (1.753 m), weight 115.2 kg, SpO2 98%.   Vitals  labs and radiology finding personally reviewed  Review of Systems:    Pertinent positives include:  left arm tingling ad weakness  Constitutional:  No weight loss, night sweats, Fevers, chills, fatigue, weight loss  HEENT:  No headaches, Difficulty swallowing,Tooth/dental problems,Sore throat,  No sneezing, itching, ear ache, nasal congestion, post nasal drip,  Cardio-vascular:  No chest pain, Orthopnea, PND, anasarca, dizziness, palpitations.no Bilateral lower extremity swelling  GI:  No heartburn, indigestion, abdominal pain, nausea, vomiting, diarrhea, change in bowel habits, loss of appetite, melena, blood in stool, hematemesis Resp:  no shortness of breath at rest. No dyspnea on exertion, No excess mucus, no productive cough, No non-productive cough, No coughing up of blood.No change in color of mucus.No wheezing. Skin:  no rash or lesions. No jaundice GU:  no dysuria, change in color of urine, no urgency or frequency. No straining to urinate.  No flank pain.  Musculoskeletal:  No joint pain or no joint swelling. No decreased range of motion. No back pain.  Psych:  No change in mood or affect. No depression or anxiety. No memory loss.  Neuro: no double vision, no gait abnormality, no slurred speech, no confusion  All systems reviewed and apart from HOPI all are negative _______________________________________________________________________________________________ Past Medical History:   Past Medical History:  Diagnosis Date   Arthritis    CAD (coronary artery disease)    a. NSTEMI: 100% stenosis of the distal RCA --> DES placed 5/17 CLEARED BY CARDIOLOGIST   Chronic low back pain    a. s/p back surgery.   CVA (cerebral infarction)    a. 02/2015 dyarthria 2/2 CVA involving the lateral aspect of the  precentral gyrus.   DM2 (diabetes mellitus, type 2) (HCC)    a. pre-diabetic in the past. b. A1c 03/2016 elevated to 7.9.   ETOH abuse    GERD (gastroesophageal reflux disease)    History of echocardiogram    a. Mild LVH, EF 55-60%, Definity  contrast used-LV wall motion could not be adequately assessed   History of non-ST elevation myocardial infarction (NSTEMI) 03/2016   a. PCI: 3.5 x 24 mm Promus Premier DES to distal RCA   History of tobacco abuse    Hypertension    a. 02/2015 echo: 55-65%, trace TR/MR. b. 03/2016: echo with EF of 55-60%.    Myocardial infarction [facility])    Obesity    Ruptured disk    Stroke [facility])    2016      Past Surgical History:  Procedure Laterality Date   CARDIAC CATHETERIZATION  05/2004   minimal  CAD   CARDIAC CATHETERIZATION N/A 03/10/2016   Procedure: Left Heart Cath and Coronary Angiography;  Surgeon: Lonni JONETTA Cash, MD;  Location: [facility] INVASIVE CV LAB;  Service: Cardiovascular;  Laterality: N/A;   CARDIAC CATHETERIZATION N/A 03/10/2016   Procedure: Coronary Stent Intervention;  Surgeon: Lonni JONETTA Cash, MD;  Location: [facility] INVASIVE CV LAB;  Service: Cardiovascular;  Laterality: N/A;   CATARACT EXTRACTION W/PHACO Left 07/12/2016   Procedure: CATARACT EXTRACTION PHACO AND INTRAOCULAR LENS PLACEMENT (IOC);  Surgeon: Adine Oneil Novak, MD;  Location: [facility] ORS;  Service: Ophthalmology;  Laterality: Left;  US   00:50 AP% 9.2 CDE 4.63 fluid pack lot # [id number]   CATARACT EXTRACTION W/PHACO Right 10/23/2021   Procedure: CATARACT EXTRACTION PHACO AND INTRAOCULAR LENS PLACEMENT (IOC) RIGHT DIABETIC;  Surgeon: Novak Adine Oneil, MD;  Location: [facility];  Service: Ophthalmology;  Laterality: Right;  Diabetic 1.15 00:16.4   CORONARY ANGIOPLASTY     STENT 5/17   LUMBAR FUSION  2000   ruptured disk, L5   SHOULDER ARTHROSCOPY WITH ROTATOR CUFF REPAIR Left    TOTAL HIP ARTHROPLASTY Left 06/03/2019   Procedure: TOTAL HIP ARTHROPLASTY;   Surgeon: Mardee Lynwood SQUIBB, MD;  Location: [facility] ORS;  Service: Orthopedics;  Laterality: Left;   TOTAL HIP ARTHROPLASTY Right 03/30/2020   Procedure: TOTAL HIP ARTHROPLASTY;  Surgeon: Mardee Lynwood SQUIBB, MD;  Location: [facility] ORS;  Service: Orthopedics;  Laterality: Right;  posterior     Social History:  Ambulatory   independently       reports that he quit smoking about 9 years ago. His smoking use included cigarettes. He has never used smokeless tobacco. He reports current alcohol use of about 12.0 standard drinks of alcohol per week. He reports that he does not use drugs.     Family History:   Family History  Problem Relation Age of Onset   Dementia Mother    Hypertension Father    Heart disease Father        MI   Heart attack Father    Lung cancer Maternal Grandmother    Heart failure Paternal Grandfather    ______________________________________________________________________________________________ Allergies: Allergies  Allergen Reactions   Ace Inhibitors Cough     Prior to Admission medications   Medication Sig Start Date End Date Taking? Authorizing Provider  amLODipine  (NORVASC ) 5 MG tablet Take 1 tablet (5 mg total) by mouth daily. 10/23/23  Yes Tower, Laine LABOR, MD  aspirin  EC 81 MG tablet Take 81 mg by mouth daily. Swallow whole.   Yes [provider]  celecoxib  (CELEBREX ) 200 MG capsule Take 200 mg by mouth daily.   Yes [provider]  cyanocobalamin  (VITAMIN B12) 1000 MCG tablet Take 1,000 mcg by mouth daily.   Yes [provider]  Evolocumab  (REPATHA  SURECLICK) 140 MG/ML SOAJ Inject 140 mg into the skin every 14 (fourteen) days. 06/08/24  Yes Cash Lonni JONETTA, MD  glipiZIDE  (GLUCOTROL  XL) 2.5 MG 24 hr tablet TAKE 1 TABLET BY MOUTH EVERY DAY WITH BREAKFAST 04/24/24  Yes Tower, Laine LABOR, MD  hydrochlorothiazide  (HYDRODIURIL ) 25 MG tablet Take 0.5 tablets (12.5 mg total) by mouth daily. 10/23/23  Yes Tower, Laine LABOR, MD  metFORMIN   (GLUCOPHAGE ) 1000 MG tablet Take 1 tablet (1,000 mg total) by mouth 2 (two) times daily with a meal. 10/23/23  Yes Tower, Laine LABOR, MD  sildenafil  (VIAGRA ) 100 MG tablet TAKE 1/2 - 1 TABLET BY MOUTH DAILY AS NEEDED FOR ERECTILE DYSFUNCTION 08/13/24  Yes Tower, Laine LABOR, MD  tirzepatide  (MOUNJARO ) 12.5 MG/0.5ML Pen Inject 12.5 mg into the skin once a week. 09/08/24  Yes Tower, Laine LABOR, MD  valsartan  (DIOVAN ) 80 MG tablet Take 1 tablet (80 mg total) by mouth daily. 10/23/23  Yes Tower, Laine LABOR, MD  nitroGLYCERIN  (NITROSTAT ) 0.4 MG SL tablet Place 1 tablet (0.4 mg total) under the tongue every 5 (five) minutes as needed for chest pain (CP or SOB). 03/12/16   Johnson Laymon HERO, PA-C    ___________________________________________________________________________________________________ Physical Exam:    10/07/2024   10:00 PM 10/07/2024    9:26 PM 10/07/2024    8:30 PM  Vitals with BMI  Height  5' 9   Weight  254 lbs   BMI  37.49   Systolic 113  126  Diastolic 55  85  Pulse 71  79     1. General:  in No  Acute distress   Chronically ill   -appearing 2. Psychological: Alert and   Oriented 3. Head/ENT:   Dry Mucous Membranes                          Head Non traumatic, neck supple                         Poor Dentition 4. SKIN: normal  Skin turgor,  Skin clean Dry and intact no rash    5. Heart: Regular rate and rhythm no  Murmur, no Rub or gallop 6. Lungs:   no wheezes or crackles   7. Abdomen: Soft,  non-tender, distended   obese  bowel sounds present 8. Lower extremities: no clubbing, cyanosis, no  edema 9. Strength diminished in left upper ext  cranial nerves II through XII intact 10. MSK: Normal range of motion    Chart has been reviewed  ______________________________________________________________________________________________  Assessment/Plan 58 y.o. male with medical history significant of prior history of stroke, CAD, HLD, HTN, DM2,  Admitted for   Stroke     Present  on Admission:  Stroke [facility])  Type 2 diabetes mellitus with hyperglycemia, without long-term current use of insulin  (HCC)  Coronary artery disease involving native coronary artery of native heart without angina pectoris  Hyperlipidemia associated with type 2 diabetes mellitus (HCC)  Morbid obesity (HCC)     Stroke [facility])  - will admit based on TIA/CVA protocol,        Monitor on Tele       /MRI   Resulted - showing acute ischemic CVA        CTA ordered No LVO        Echo to evaluate for possible embolic source,        obtain cardiac enzymes,  ECG,   Lipid panel, TSH.        Order PT/OT evaluation.        keep nothing by mouth until passes swallow eval         Will make sure patient is on antiplatelet ASA   + Plavix  agent and statin  Check CK prior to starting statin       Allow permissive Hypertension keep BP <220/120        Neurology consulted Have seen pt in ER    Type 2 diabetes mellitus with hyperglycemia, without long-term current use of insulin  (HCC) Order ssi hold po meds  Coronary artery disease involving native coronary artery of native heart without angina pectoris Continue aspirin  patient is on Repatha   History of tobacco abuse In remission  Hyperlipidemia associated with type 2 diabetes mellitus (HCC) On repatha   Morbid obesity (HCC) Contributing to comorbidity and complicating medical management  Body mass index is 37.51 kg/m.  Nutritional follow up as an out pt would be recommended   DM2 (diabetes mellitus, type 2) (HCC)  - Order Sensitive SSI    -  check TSH and HgA1C  - Hold by mouth medications     Other plan as per orders.  DVT prophylaxis:  SCD       Code Status:    Code Status: Full Code FULL CODE   as per patient   I had personally discussed CODE STATUS with patient and family  ACP   none   Family Communication:   Family   at  Bedside  plan of care was discussed   with  Wife,    Diet  Diet Orders (From admission, onward)      Start     Ordered   10/08/24 0030  Diet Carb Modified Fluid consistency: Thin; Room service appropriate? Yes  Diet effective now       Question Answer Comment  Diet-HS Snack? Nothing   Calorie Level Medium 1600-2000   Fluid consistency: Thin   Room service appropriate? Yes      10/08/24 0029            Disposition Plan:       To home once workup is complete and patient is stable   Following barriers for discharge:                             Stroke work up is complete                                 Consult Orders  (From admission, onward)           Start     Ordered   10/07/24 2234  Consult to hospitalist  PG SENT BY DELORIS 22:34  Once       Provider:  (Not yet assigned)  Question Answer Comment  Place call to: [facility]   Reason for Consult Admit      10/07/24 2234                               Would benefit from PT/OT eval prior to DC  Ordered                                    Consults called: Neurology   Admission status:  ED Disposition     ED Disposition  Admit   Condition  --   Comment  Hospital Area: [facility] [[id number]]  Level of Care: Telemetry [5]  Diagnosis: Stroke [facility]) [[id number]]  Admitting Physician: Mehtaab Mayeda [[id number]]  Attending Physician: Kaydince Towles [[id number]]          Obs     Level of care     tele  For   24H      Cherie Lasalle 10/08/2024, 12:45 AM    [facility]     after 2 AM please page floor coverage   If 7AM-7PM, please contact the day team taking care of the patient using [email or web address]

## 2024-10-08 NOTE — ED Notes (Signed)
PT ambulated to the bathroom.

## 2024-10-08 NOTE — Evaluation (Signed)
 Occupational Therapy Evaluation Patient Details Name: Daniel [name] MRN: [id number] DOB: Jun 16, 1966 Today's Date: 10/08/2024   History of Present Illness   58 yo M adm 10/07/24 with LUE tingling with frontoparietal infarct. PMhx: HTN, BPH, CVA, NSTEMI, Bil THA, T2DM, CAD, HLD, partial Rt eye blindness     Clinical Impressions Per PT, pt with ongoing L hand deficits so OT eval completed. 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 Pt presents now with deficits in L hand sensation, strength and coordination. Educated on fine motor activities to complete, theraputty HEP and provided squeeze ball and sponge. Despite deficits in L nondominant UE, pt able to complete ADLs with Modified Independence. As L hand deficits could impact daily routine and work duties, recommend OP OT to address L hand function upon DC w/ pt agreeable with this plan.      If plan is discharge home, recommend the following:   Other (comment) (PRN)     Functional Status Assessment   Patient has had a recent decline in their functional status and demonstrates the ability to make significant improvements in function in a reasonable and predictable amount of time.     Equipment Recommendations   None recommended by OT     Recommendations for Other Services         Precautions/Restrictions   Precautions Precautions: None Restrictions Weight Bearing Restrictions Per Provider Order: No     Mobility Bed Mobility Overal bed mobility: Modified Independent                  Transfers Overall transfer level: Independent Equipment used: None                      Balance Overall balance assessment: No apparent balance deficits (not formally assessed)                                         ADL either performed or assessed with clinical judgement   ADL Overall ADL's : Independent                                        General ADL Comments: Pt remains able to mobilize without assist, able to manage toileting tasks without issues. difficulty with fine motor tasks using L nondominant hand but able to open lotion and apply to R hand (recent tattoo that needed moisturizing). Educated on fine motor activities and continued use of L hand     Vision Baseline Vision/History: 1 Wears glasses Ability to See in Adequate Light: 1 Impaired Patient Visual Report: No change from baseline Vision Assessment?: No apparent visual deficits Additional Comments: baseline R eye visual deficits, unchanged     Perception         Praxis         Pertinent Vitals/Pain Pain Assessment Pain Assessment: No/denies pain     Extremity/Trunk Assessment Upper Extremity Assessment Upper Extremity Assessment: Right hand dominant;LUE deficits/detail;RUE deficits/detail RUE Deficits / Details: reports arthritis in hands and unable to fully make fist (1-2 digits unable to fully perform composite flexion) LUE Deficits / Details: arthritis in hand, unable to oppose 4-5 digits and slow opposing the others. unable to make fist to hold item. Provided squeeze ball,  squeeze sponge and putty w/ pt able to squeeze sponge easily, more challenged with squeeze ball and some difficulty holding on to items. reports sensation changes on dorsal aspect of hand (Like its asleep) LUE Coordination: decreased fine motor   Lower Extremity Assessment Lower Extremity Assessment: Defer to PT evaluation   Cervical / Trunk Assessment Cervical / Trunk Assessment: Normal   Communication Communication Communication: No apparent difficulties   Cognition Arousal: Alert Behavior During Therapy: WFL for tasks assessed/performed Cognition: No apparent impairments                               Following commands: Intact       Cueing  General Comments   Cueing Techniques: Verbal cues       Exercises     Shoulder Instructions      Home Living Family/patient expects to be discharged to:: Private residence Living Arrangements: Spouse/significant other Available Help at Discharge: Family Type of Home: House Home Access: Stairs to enter Secretary/administrator of Steps: 4   Home Layout: One level               Home Equipment: None          Prior Functioning/Environment Prior Level of Function : Independent/Modified Independent;Working/employed;Driving               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 Problem List: Decreased strength;Decreased coordination;Impaired UE functional use;Impaired sensation   OT Treatment/Interventions: Self-care/ADL training;Therapeutic exercise;Energy conservation;Manual therapy;Therapeutic activities;Patient/family education      OT Goals(Current goals can be found in the care plan section)   Acute Rehab OT Goals Patient Stated Goal: go home soon, for L hand function to recover OT Goal Formulation: With patient Time For Goal Achievement: 10/22/24 Potential to Achieve Goals: Good   OT Frequency:  Min 1X/week    Co-evaluation              AM-PAC OT 6 Clicks Daily Activity     Outcome Measure Help from another person eating meals?: None Help from another person taking care of personal grooming?: None Help from another person toileting, which includes using toliet, bedpan, or urinal?: None Help from another person bathing (including washing, rinsing, drying)?: None Help from another person to put on and taking off regular upper body clothing?: None Help from another person to put on and taking off regular lower body clothing?: None 6 Click Score: 24   End of Session    Activity Tolerance: Patient tolerated treatment well Patient left: in bed;with call bell/phone within reach  OT Visit Diagnosis: Muscle weakness (generalized) (M62.81)                Time: 1017-1040 OT Time  Calculation (min): 23 min Charges:  OT General Charges $OT Visit: 1 Visit OT Evaluation $OT Eval Low Complexity: 1 Low  Mliss NOVAK, OTR/L Acute Rehab Services Office: [phone]   Mliss Fish 10/08/2024, 12:38 PM

## 2024-10-08 NOTE — Progress Notes (Signed)
 PROGRESS NOTE    Daniel [name]  FMW:[id number] DOB: [birth year] DOA: 10/07/2024 PCP: Randeen Laine LABOR, MD   Brief Narrative:  Daniel [name] is a 58 y.o. 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 He states the symptoms started when he got out of the shower and tried to pick up a glass with his left hand - the glass kept slipping out. He also had numbness of the fingers of his left hand. There was no weakness at the level of the elbow or shoulder. No left leg weakness or numbness was noted at that time. He also denied any facial weakness, facial numbness, new vision changes, dysarthria, confusion or trouble talking. No difficulty ambulating.   Assessment & Plan:   Principal Problem:   Stroke [facility]) Active Problems:   History of tobacco abuse   Hyperlipidemia associated with type 2 diabetes mellitus (HCC)   Coronary artery disease involving native coronary artery of native heart without angina pectoris   Type 2 diabetes mellitus with hyperglycemia, without long-term current use of insulin  (HCC)   Morbid obesity (HCC)   DM2 (diabetes mellitus, type 2) (HCC)  Acute ischemic stroke: CT head: No acute intracranial abnormality. ASPECTS: 10. Old infarcts of the right precentral gyrus and right basal ganglia. Mild chronic ischemic white matter changes.   MRI brain: Multifocal early subacute ischemia at the right frontoparietal junction affecting the precentral and postcentral gyri. Multifocal cerebral white matter T2 hyperintensities most consistent with chronic small vessel disease..  CT angio head and neck ruled out LVO. - MRI C-spine: C2-C3 small left subarticular disc protrusion with bilateral uncovertebral  spurring, without central spinal canal or neural foraminal stenosis. Seen by neurology, started on aspirin  and Plavix  as well as atorvastatin .  LDL 57, HDL low at 39.  Hemoglobin A1c pending.  PT OT assessment pending.  Echo pending.  Essential hypertension: PTA medication include amlodipine  5 mg, hydrochlorothiazide  and valsartan .  All of them are on hold to allow permissive hypertension, blood pressure low normal.  Type 2 diabetes mellitus: Last hemoglobin A1c 9.1 which was 11 months ago.  PTA medication include glipizide , metformin .  Currently on SSI.  Hypomagnesemia: Replenished yesterday.  Resolved.  Chronic thrombocytopenia: Stable.  History of CAD/hyperlipidemia: Continue aspirin  and Repatha .  Started on atorvastatin .  Morbid obesity class II: Weight loss and diet modification counseled. DVT prophylaxis: SCD's Start: 10/08/24 0400   Code Status: Full Code  Family Communication:  None present at bedside.  Plan of care discussed with patient in length and he/she verbalized understanding and agreed with it.  Status is: Observation The patient will require care spanning > 2 midnights and should be moved to inpatient because: Needs to be seen by PT OT and neurology.   Estimated body mass index is 37.51 kg/m as calculated from the following:   Height as of this encounter: 5' 9 (1.753 m).   Weight as of this encounter: 115.2 kg.    Nutritional Assessment: Body mass index is 37.51 kg/m.[name] Seen by dietician.  I agree with the assessment and plan as outlined below: Nutrition Status:        . Skin Assessment: I have examined the patient's skin and I agree with the wound assessment as  performed by the wound care RN as outlined below:    Consultants:  Neurology  Procedures:  None  Antimicrobials:  Anti-infectives (From admission, onward)    None         Subjective: Seen and examined, he still has weakness in the left hand which has not improved.  Does not have any  other complaint.  Objective: Vitals:   10/07/24 2300 10/08/24 0330 10/08/24 0400 10/08/24 0720  BP:  124/69 104/62 119/70  Pulse:  73  70  Resp:  17 18 19   Temp: 97.8 F (36.6 C)   97.9 F (36.6 C)  TempSrc: Oral   Oral  SpO2:  98%  96%  Weight:      Height:        Intake/Output Summary (Last 24 hours) at 10/08/2024 0828 Last data filed at 10/08/2024 0324 Gross per 24 hour  Intake 97.76 ml  Output --  Net 97.76 ml   Filed Weights   10/07/24 1938 10/07/24 2126  Weight: 115.2 kg 115.2 kg    Examination:  General exam: Appears calm and comfortable  Respiratory system: Clear to auscultation. Respiratory effort normal. Cardiovascular system: S1 & S2 heard, RRR. No JVD, murmurs, rubs, gallops or clicks. No pedal edema. Gastrointestinal system: Abdomen is nondistended, soft and nontender. No organomegaly or masses felt. Normal bowel sounds heard. Central nervous system: Alert and oriented.  4/5 power in left hand.  No other focal deficit. Extremities: Symmetric 5 x 5 power. Skin: No rashes, lesions or ulcers Psychiatry: Judgement and insight appear normal. Mood & affect appropriate.    Data Reviewed: I have personally reviewed following labs and imaging studies  CBC: Recent Labs  Lab 10/07/24 1940 10/07/24 1948 10/08/24 0548  WBC 7.0  --  6.4  NEUTROABS 4.4  --   --   HGB 15.3 15.6 14.1  HCT 45.1 46.0 42.1  MCV 94.5  --  95.7  PLT 111*  --  93*   Basic Metabolic Panel: Recent Labs  Lab 10/07/24 1940 10/07/24 1948 10/08/24 0548  NA 133* 137 135  K 4.8 4.7 4.2  CL 101 102 103  CO2 24  --  24  GLUCOSE 192* 189* 132*  BUN 14 18 15   CREATININE 1.17 1.20 1.02  CALCIUM  8.9  --  9.1  MG 1.6*  --   --   PHOS 3.4  --   --    GFR: Estimated Creatinine Clearance: 96.4 mL/min (by C-G formula based on SCr of 1.02 mg/dL). Liver Function Tests: Recent Labs  Lab 10/07/24 1940 10/08/24 0548  AST 170* 124*  ALT 169* 142*  ALKPHOS 152* 110  BILITOT 1.0 0.9  PROT  7.0 6.4*  ALBUMIN 3.5 3.1*   No results for input(s): LIPASE, AMYLASE in the last 168 hours. No results for input(s): AMMONIA in the last 168 hours. Coagulation Profile: Recent Labs  Lab 10/07/24 1940  INR 0.9   Cardiac Enzymes: Recent Labs  Lab 10/07/24 1940  CKTOTAL 85   BNP (last 3 results) No results for input(s): PROBNP in the last 8760 hours. HbA1C: No results for input(s): HGBA1C in the last 72 hours. CBG: Recent Labs  Lab 10/07/24 1943 10/08/24 0006 10/08/24 0227 10/08/24 0734  GLUCAP 190* 117* 141* 144*   Lipid Profile: Recent Labs    10/08/24 0548  CHOL 104  HDL 39*  LDLCALC 57  TRIG 42  CHOLHDL 2.7   Thyroid  Function Tests: No results for input(s): TSH, T4TOTAL, FREET4, T3FREE, THYROIDAB in  the last 72 hours. Anemia Panel: No results for input(s): VITAMINB12, FOLATE, FERRITIN, TIBC, IRON, RETICCTPCT in the last 72 hours. Sepsis Labs: No results for input(s): PROCALCITON, LATICACIDVEN in the last 168 hours.  No results found for this or any previous visit (from the past 240 hours).    Scheduled Meds:  [START ON 10/09/2024]  stroke: early stages of recovery book   Does not apply Once   aspirin  EC  81 mg Oral Daily   clopidogrel   75 mg Oral Daily   insulin  aspart  0-9 Units Subcutaneous Q4H   oxyCODONE   5 mg Oral Once   Continuous Infusions:  sodium chloride        LOS: 0 days   Fredia Skeeter, MD [facility]  10/08/2024, 8:28 AM   *Please note that this is a verbal dictation therefore any spelling or grammatical errors are due to the Dragon Medical One system interpretation.  Please page via Amion and do not message via secure chat for urgent patient care matters. Secure chat can be used for non urgent patient care matters.  How to contact the [facility] Attending or Consulting provider 7A - 7P or covering provider during after hours 7P -7A, for this patient?  Check the care team in [facility] and look for a)  attending/consulting [facility] provider listed and b) the [facility] team listed. Page or secure chat 7A-7P. Log into [email or web address] and use [facility] universal password to access. If you do not have the password, please contact the hospital operator. Locate the [facility] provider you are looking for under [facility] and page to a number that you can be directly reached. If you still have difficulty reaching the provider, please page the [facility] (Director on Call) for the Hospitalists listed on amion for assistance.

## 2024-10-08 NOTE — Progress Notes (Signed)
  Echocardiogram 2D Echocardiogram has been performed.  Koleen KANDICE Popper, RDCS 10/08/2024, 8:55 AM

## 2024-10-08 NOTE — Assessment & Plan Note (Signed)
 Continue aspirin  patient is on Repatha 

## 2024-10-08 NOTE — Progress Notes (Signed)
 OT Cancellation Note  Patient Details Name: Daniel [name] MRN: [id number] DOB: September 11, 1966   Cancelled Treatment:    Reason Eval/Treat Not Completed: OT screened, no needs identified, will sign off Discussed with nursing. Pt NIH 0, ambulating to bathroom independently and at baseline. No formal OT eval needed at this time  Mliss Fish 10/08/2024, 9:22 AM

## 2024-10-08 NOTE — Assessment & Plan Note (Signed)
 -  Order Sensitive  SSI     -  check TSH and HgA1C  - Hold by mouth medications*

## 2024-10-08 NOTE — Assessment & Plan Note (Signed)
 On repatha

## 2024-10-08 NOTE — Discharge Summary (Signed)
 Physician Discharge Summary  Daniel [name] FMW:[id number] DOB: [birth year] DOA: 10/07/2024  PCP: Randeen Laine LABOR, MD  Admit date: 10/07/2024 Discharge date: 10/08/2024    Admitted From: Home Disposition: Home  Recommendations for Outpatient Follow-up:  Follow up with PCP in 1-2 weeks Please obtain BMP/CBC in one week Home Follow-up with neurology in 4 weeks Follow-up with ophthalmology for left visual issues Please follow up with your PCP on the following pending results: Unresulted Labs (From admission, onward)     Start     Ordered   10/08/24 0400  Hemoglobin A1c  (Labs)  Once,   R       Comments: To assess prior glycemic control    10/08/24 0359              Home Health: None Equipment/Devices: None  Discharge Condition: stable CODE STATUS:full code Diet recommendation:  Diet Order             Diet Heart Room service appropriate? Yes; Fluid consistency: Thin  Diet effective now                   Subjective: Patient seen and examined early in the morning, he had no new complaint other than persistent left hand weakness.  Patient eager to go home.  Brief/Interim Summary: Daniel [name] is a 58 y.o. 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 He states the symptoms started when he got out of the shower and tried to pick up a glass with his left hand - the glass kept slipping out. He also had numbness of the fingers of his left hand. There was no weakness at the level of the elbow or shoulder. No left leg weakness or numbness was noted at that time. He also denied any facial weakness, facial numbness, new vision changes, dysarthria, confusion or trouble talking. No difficulty  ambulating.  Details of hospitalization below.  Acute ischemic stroke: CT head: No acute intracranial abnormality. ASPECTS: 10. Old infarcts of the right precentral gyrus and right basal ganglia. Mild chronic ischemic white matter changes.   MRI brain: Multifocal early subacute ischemia at the right frontoparietal junction affecting the precentral and postcentral gyri. Multifocal cerebral white matter T2 hyperintensities most consistent with chronic small vessel disease..  CT angio head and neck ruled out LVO. - MRI C-spine: C2-C3 small left subarticular disc protrusion with bilateral uncovertebral spurring, without central spinal canal or neural foraminal stenosis. Seen by neurology, started on aspirin  and Plavix  as well as atorvastatin .  LDL 57, HDL low at 39.  Hemoglobin A1c 7.0.  Seen by PT OT, did not require any home health PT.  Echo ruled out PFO, normal ejection fraction.  Patient was seen and treated and cleared by neurology for discharge with recommendations to continue aspirin  and Plavix  both/DAPT for 21 days followed by Plavix  alone because patient was already on aspirin  PTA.  Neurology recommended 30-day heart monitor for which cardiology was notified.  They will mail it to him.   Essential hypertension: PTA medication include amlodipine  5 mg, hydrochlorothiazide  and valsartan .  All of them were on hold to allow permissive hypertension, blood pressure low normal.  Resume home medications.   Type 2 diabetes mellitus: Last  hemoglobin A1c 9.1 which was 11 months ago.  PTA medication include glipizide , metformin .  Resume home medications.   Hypomagnesemia: Replenished/resolved.   Chronic thrombocytopenia: Stable.   History of CAD/hyperlipidemia: Continue aspirin  and Repatha .  Started on atorvastatin .  Will transition to only Plavix  after 21 days.  Discharge Diagnoses:  Principal Problem:   Stroke [facility]) Active Problems:   History of tobacco abuse   Hyperlipidemia associated with type  2 diabetes mellitus (HCC)   Coronary artery disease involving native coronary artery of native heart without angina pectoris   Type 2 diabetes mellitus with hyperglycemia, without long-term current use of insulin  (HCC)   Morbid obesity (HCC)   DM2 (diabetes mellitus, type 2) (HCC)   Stroke-like symptoms    Discharge Instructions   Allergies as of 10/08/2024       Reactions   Ace Inhibitors Cough        Medication List     TAKE these medications    amLODipine  5 MG tablet Commonly known as: NORVASC  Take 1 tablet (5 mg total) by mouth daily.   aspirin  EC 81 MG tablet Take 1 tablet (81 mg total) by mouth daily for 21 days. Swallow whole.   celecoxib  200 MG capsule Commonly known as: CELEBREX  Take 200 mg by mouth daily.   clopidogrel  75 MG tablet Commonly known as: PLAVIX  Take 1 tablet (75 mg total) by mouth daily. Start taking on: October 09, 2024   cyanocobalamin  1000 MCG tablet Commonly known as: VITAMIN B12 Take 1,000 mcg by mouth daily.   glipiZIDE  2.5 MG 24 hr tablet Commonly known as: GLUCOTROL  XL TAKE 1 TABLET BY MOUTH EVERY DAY WITH BREAKFAST   hydrochlorothiazide  25 MG tablet Commonly known as: HYDRODIURIL  Take 0.5 tablets (12.5 mg total) by mouth daily.   metFORMIN  1000 MG tablet Commonly known as: GLUCOPHAGE  Take 1 tablet (1,000 mg total) by mouth 2 (two) times daily with a meal.   nitroGLYCERIN  0.4 MG SL tablet Commonly known as: NITROSTAT  Place 1 tablet (0.4 mg total) under the tongue every 5 (five) minutes as needed for chest pain (CP or SOB).   Repatha  SureClick 140 MG/ML Soaj Generic drug: Evolocumab  Inject 140 mg into the skin every 14 (fourteen) days.   sildenafil  100 MG tablet Commonly known as: VIAGRA  TAKE 1/2 - 1 TABLET BY MOUTH DAILY AS NEEDED FOR ERECTILE DYSFUNCTION   tirzepatide  12.5 MG/0.5ML Pen Commonly known as: MOUNJARO  Inject 12.5 mg into the skin once a week.   valsartan  80 MG tablet Commonly known as: DIOVAN  Take 1  tablet (80 mg total) by mouth daily.        Follow-up Information     Tower, Laine LABOR, MD Follow up in 1 week(s).   Specialties: Family Medicine, Radiology Contact information: [address] [place] [address] [phone]         [facility] Follow up in 1 week(s).          [facility] .   Contact information: [address] [place] [address] [phone]         [facility] Follow up in 1 month(s).   Contact information: [address]     [address] [place]  [address] [phone]               Allergies  Allergen Reactions   Ace Inhibitors Cough    Consultations: Neurology   Procedures/Studies: ECHOCARDIOGRAM COMPLETE Result Date: 10/08/2024    ECHOCARDIOGRAM REPORT   Patient  Name:   Daniel [name] Date of Exam: 10/08/2024 Medical Rec #:  [id number]       Height:       69.0 in Accession #:    [id number]      Weight:       254.0 lb Date of Birth:  [birth year]       BSA:          2.287 m Patient Age:    58 years        BP:           119/70 mmHg Patient Gender: M               HR:           87 bpm. Exam Location:  Inpatient Procedure: 2D Echo, Cardiac Doppler, Color Doppler and Intracardiac            Opacification Agent (Both Spectral and Color Flow Doppler were            utilized during procedure). Indications:    Stroke I63.9  History:        Patient has prior history of Echocardiogram examinations, most                 recent 02/14/2024. CAD, Stroke, Arrythmias:non-specific ST                 changes; Risk Factors:Former Smoker, Dyslipidemia, Hypertension                 and Diabetes.  Sonographer:    Koleen Popper RDCS Referring Phys: [id number] ANASTASSIA DOUTOVA  Sonographer Comments: Suboptimal apical window. IMPRESSIONS  1. Left ventricular ejection fraction, by estimation, is 60 to 65%. Left ventricular ejection fraction by 2D MOD biplane is 61.5 %. The left ventricle has  normal function. Left ventricular endocardial border not optimally defined to evaluate regional wall motion. Left ventricular diastolic parameters were normal.  2. Right ventricular systolic function is normal. The right ventricular size is mildly enlarged.  3. The mitral valve is normal in structure. No evidence of mitral valve regurgitation. No evidence of mitral stenosis.  4. The aortic valve is normal in structure. Aortic valve regurgitation is not visualized. Aortic valve sclerosis/calcification is present, without any evidence of aortic stenosis.  5. The inferior vena cava is normal in size with greater than 50% respiratory variability, suggesting right atrial pressure of 3 mmHg. Comparison(s): No significant change from prior study. FINDINGS  Left Ventricle: Left ventricular ejection fraction, by estimation, is 60 to 65%. Left ventricular ejection fraction by 2D MOD biplane is 61.5 %. The left ventricle has normal function. Left ventricular endocardial border not optimally defined to evaluate regional wall motion. Definity  contrast agent was given IV to delineate the left ventricular endocardial borders. The left ventricular internal cavity size was normal in size. There is no left ventricular hypertrophy. Left ventricular diastolic parameters were normal. Right Ventricle: The right ventricular size is mildly enlarged. No increase in right ventricular wall thickness. Right ventricular systolic function is normal. Left Atrium: Left atrial size was normal in size. Right Atrium: Right atrial size was normal in size. Pericardium: There is no evidence of pericardial effusion. Mitral Valve: The mitral valve is normal in structure. No evidence of mitral valve regurgitation. No evidence of mitral valve stenosis. Tricuspid Valve: The tricuspid valve is not well visualized. Tricuspid valve regurgitation is not demonstrated. No evidence of tricuspid stenosis. Aortic Valve: The aortic valve is normal in structure. Aortic  valve  regurgitation is not visualized. Aortic valve sclerosis/calcification is present, without any evidence of aortic stenosis. Pulmonic Valve: The pulmonic valve was not well visualized. Pulmonic valve regurgitation is not visualized. No evidence of pulmonic stenosis. Aorta: The aortic root is normal in size and structure. Venous: The inferior vena cava is normal in size with greater than 50% respiratory variability, suggesting right atrial pressure of 3 mmHg. IAS/Shunts: No atrial level shunt detected by color flow Doppler.  LEFT VENTRICLE PLAX 2D                        Biplane EF (MOD) LVIDd:         5.30 cm         LV Biplane EF:   Left LVIDs:         3.20 cm                          ventricular LV PW:         0.90 cm                          ejection LV IVS:        1.20 cm                          fraction by LVOT diam:     2.10 cm                          2D MOD LV SV:         61                               biplane is LV SV Index:   27                               61.5 %. LVOT Area:     3.46 cm                                Diastology                                LV e' medial:    7.83 cm/s LV Volumes (MOD)               LV E/e' medial:  9.0 LV vol d, MOD    53.4 ml       LV e' lateral:   10.60 cm/s A2C:                           LV E/e' lateral: 6.6 LV vol d, MOD    52.3 ml A4C: LV vol s, MOD    18.3 ml A2C: LV vol s, MOD    22.4 ml A4C: LV SV MOD A2C:   35.1 ml LV SV MOD A4C:   52.3 ml LV SV MOD BP:    32.7 ml RIGHT VENTRICLE            IVC RV Basal diam:  4.60 cm    IVC diam: 0.90 cm RV Mid  diam:    3.90 cm RV S prime:     9.57 cm/s TAPSE (M-mode): 1.6 cm LEFT ATRIUM             Index        RIGHT ATRIUM           Index LA diam:        4.10 cm 1.79 cm/m   RA Area:     17.80 cm LA Vol (A2C):   42.5 ml 18.58 ml/m  RA Volume:   51.90 ml  22.69 ml/m LA Vol (A4C):   34.1 ml 14.91 ml/m LA Biplane Vol: 39.3 ml 17.18 ml/m  AORTIC VALVE LVOT Vmax:   90.10 cm/s LVOT Vmean:  59.700 cm/s LVOT VTI:    0.177  m  AORTA Ao Root diam: 3.50 cm Ao Asc diam:  3.50 cm MITRAL VALVE MV Area (PHT): 3.72 cm    SHUNTS MV Decel Time: 204 msec    Systemic VTI:  0.18 m MV E velocity: 70.20 cm/s  Systemic Diam: 2.10 cm MV A velocity: 76.00 cm/s MV E/A ratio:  0.92 Franck Azobou Tonleu Electronically signed by Joelle Cedars Tonleu Signature Date/Time: 10/08/2024/10:21:34 AM    Final    DG CHEST PORT 1 VIEW Result Date: 10/08/2024 CLINICAL DATA:  Left hand weakness. EXAM: PORTABLE CHEST 1 VIEW COMPARISON:  September 26, 2023 FINDINGS: The heart size and mediastinal contours are within normal limits. Both lungs are clear. Multilevel degenerative changes are noted throughout the thoracic spine. IMPRESSION: No active disease. Electronically Signed   By: Suzen Dials M.D.   On: 10/08/2024 01:06   CT ANGIO HEAD NECK W WO CM Result Date: 10/08/2024 EXAM: CTA HEAD AND NECK WITH AND WITHOUT 10/07/2024 11:51:49 PM TECHNIQUE: CTA of the head and neck was performed with and without the administration of 75 mL of iohexol  (OMNIPAQUE ) 350 MG/ML injection. Multiplanar 2D and/or 3D reformatted images are provided for review. Automated exposure control, iterative reconstruction, and/or weight based adjustment of the mA/kV was utilized to reduce the radiation dose to as low as reasonably achievable. Stenosis of the internal carotid arteries measured using NASCET criteria. COMPARISON: None available CLINICAL HISTORY: Neuro deficit, acute, stroke suspected. FINDINGS: CTA NECK: AORTIC ARCH AND ARCH VESSELS: No dissection or arterial injury. No significant stenosis of the brachiocephalic or subclavian arteries. CERVICAL CAROTID ARTERIES: Right carotid bifurcation and proximal ICA: Mixed density atherosclerotic disease with atherosclerotic bifurcation with 50% stenosis of the proximal ICA. Left carotid bifurcation and proximal ICA: Calcific atherosclerosis with less than 50% stenosis. No dissection or arterial injury. CERVICAL VERTEBRAL ARTERIES: No  dissection, arterial injury, or significant stenosis. LUNGS AND MEDIASTINUM: Unremarkable. SOFT TISSUES: No acute abnormality. BONES: No acute abnormality. CTA HEAD: ANTERIOR CIRCULATION: Internal carotid arteries: Calcific atherosclerosis of both carotid siphons without high-grade stenosis. Anterior cerebral arteries: No significant stenosis. Middle cerebral arteries: No significant stenosis. No aneurysm. POSTERIOR CIRCULATION: Posterior cerebral arteries: No significant stenosis. Basilar artery: No significant stenosis. Vertebral arteries: No significant stenosis. No aneurysm. OTHER: No dural venous sinus thrombosis on this non-dedicated study. IMPRESSION: 1. No emergent large vessel occlusion. 2. Mixed-density atherosclerotic disease with approximately 50% stenosis of the right proximal ICA. 3. Calcific atherosclerosis at the left carotid bifurcation and proximal ICA with less than 50% stenosis. 4. Calcific atherosclerosis of both carotid siphons without high-grade stenosis. Electronically signed by: Franky Stanford MD 10/08/2024 12:22 AM EST RP Workstation: [id number]   MR BRAIN WO CONTRAST Addendum Date: 10/08/2024 ADDENDUM #1 ADDENDUM: While the intermediate signal  intensity of the frontoparietal infarct is consistent with acute to early subacute ischemia, given the reported timeline of symptoms beginning around 6pm, an acute timeline is certainly possible. The statement No acute intracranial abnormality was included by mistake in the original impression. ---------------------------------------------------- Electronically signed by: Franky Stanford MD 10/08/2024 12:19 AM EST RP Workstation: [id number]   Result Date: 10/08/2024  ORIGINAL REPORT EXAM: MRI BRAIN WITHOUT CONTRAST MRI CERVICAL SPINE WITHOUT CONTRAST 10/07/2024 09:07:35 PM TECHNIQUE: Multiplanar multisequence MRI of the brain was performed without the administration of intravenous contrast. Multiplanar multisequence MRI of the cervical spine was  performed without the administration of intravenous contrast. COMPARISON: Brain MRI 02/27/2015. CLINICAL HISTORY: Neuro deficit, acute, stroke suspected. FINDINGS: MRI BRAIN: BRAIN AND VENTRICLES: Multifocal early subacute ischemia at the right frontoparietal junction affecting the precentral and postcentral gyri. Test magnetic susceptibility artifact in the region of the ischemia, which could represent a small amount of distal thromboembolic material. Multifocal hyperintense T2-weighted signal within the cerebral white matter, most commonly due to chronic small vessel disease. No acute intracranial hemorrhage. No mass or abnormal enhancement. No midline shift. No hydrocephalus. The sella is unremarkable. Normal flow voids. ORBITS: No acute abnormality. SINUSES AND MASTOIDS: No acute abnormality. BONES AND SOFT TISSUES: Normal bone marrow signal. No acute soft tissue abnormality. MRI CERVICAL SPINE: BONES AND ALIGNMENT: Normal alignment. Normal vertebral body heights. Marrow signal is unremarkable. No abnormal enhancement. SPINAL CORD: Normal spinal cord size. Normal spinal cord signal. SOFT TISSUES: Unremarkable. C2-C3: Small left subarticular disc protrusion with bilateral uncovertebral spurring. No central spinal canal or neural foraminal stenosis. C3-C4: No significant disc herniation. No spinal canal stenosis or neural foraminal narrowing. C4-C5: No significant disc herniation. No spinal canal stenosis or neural foraminal narrowing. C5-C6: No significant disc herniation. No spinal canal stenosis or neural foraminal narrowing. C6-C7: No significant disc herniation. No spinal canal stenosis or neural foraminal narrowing. C7-T1: No significant disc herniation. No spinal canal stenosis or neural foraminal narrowing. IMPRESSION: 1. No acute intracranial abnormality. 2. Multifocal early subacute ischemia at the right frontoparietal junction affecting the precentral and postcentral gyri. 3. Multifocal cerebral white  matter T2 hyperintensities most consistent with chronic small vessel disease. 4. C2-C3 small left subarticular disc protrusion with bilateral uncovertebral spurring, without central spinal canal or neural foraminal stenosis. Electronically signed by: Franky Stanford MD 10/07/2024 10:02 PM EST RP Workstation: [id number]   MR CERVICAL SPINE WO CONTRAST Addendum Date: 10/08/2024  ADDENDUM #1 ADDENDUM: While the intermediate signal intensity of the frontoparietal infarct is consistent with acute to early subacute ischemia, given the reported timeline of symptoms beginning around 6pm, an acute timeline is certainly possible. The statement No acute intracranial abnormality was included by mistake in the original impression. ---------------------------------------------------- Electronically signed by: Franky Stanford MD 10/08/2024 12:19 AM EST RP Workstation: [id number]   Result Date: 10/08/2024  ORIGINAL REPORT EXAM: MRI BRAIN WITHOUT CONTRAST MRI CERVICAL SPINE WITHOUT CONTRAST 10/07/2024 09:07:35 PM TECHNIQUE: Multiplanar multisequence MRI of the brain was performed without the administration of intravenous contrast. Multiplanar multisequence MRI of the cervical spine was performed without the administration of intravenous contrast. COMPARISON: Brain MRI 02/27/2015. CLINICAL HISTORY: Neuro deficit, acute, stroke suspected. FINDINGS: MRI BRAIN: BRAIN AND VENTRICLES: Multifocal early subacute ischemia at the right frontoparietal junction affecting the precentral and postcentral gyri. Test magnetic susceptibility artifact in the region of the ischemia, which could represent a small amount of distal thromboembolic material. Multifocal hyperintense T2-weighted signal within the cerebral white matter, most commonly due to chronic small vessel disease. No  acute intracranial hemorrhage. No mass or abnormal enhancement. No midline shift. No hydrocephalus. The sella is unremarkable. Normal flow voids. ORBITS: No acute  abnormality. SINUSES AND MASTOIDS: No acute abnormality. BONES AND SOFT TISSUES: Normal bone marrow signal. No acute soft tissue abnormality. MRI CERVICAL SPINE: BONES AND ALIGNMENT: Normal alignment. Normal vertebral body heights. Marrow signal is unremarkable. No abnormal enhancement. SPINAL CORD: Normal spinal cord size. Normal spinal cord signal. SOFT TISSUES: Unremarkable. C2-C3: Small left subarticular disc protrusion with bilateral uncovertebral spurring. No central spinal canal or neural foraminal stenosis. C3-C4: No significant disc herniation. No spinal canal stenosis or neural foraminal narrowing. C4-C5: No significant disc herniation. No spinal canal stenosis or neural foraminal narrowing. C5-C6: No significant disc herniation. No spinal canal stenosis or neural foraminal narrowing. C6-C7: No significant disc herniation. No spinal canal stenosis or neural foraminal narrowing. C7-T1: No significant disc herniation. No spinal canal stenosis or neural foraminal narrowing. IMPRESSION: 1. No acute intracranial abnormality. 2. Multifocal early subacute ischemia at the right frontoparietal junction affecting the precentral and postcentral gyri. 3. Multifocal cerebral white matter T2 hyperintensities most consistent with chronic small vessel disease. 4. C2-C3 small left subarticular disc protrusion with bilateral uncovertebral spurring, without central spinal canal or neural foraminal stenosis. Electronically signed by: Franky Stanford MD 10/07/2024 10:02 PM EST RP Workstation: [id number]   CT HEAD CODE STROKE WO CONTRAST (LKW 0-4.5h, LVO 0-24h) Result Date: 10/07/2024 EXAM: CT HEAD WITHOUT CONTRAST 10/07/2024 07:50:42 PM TECHNIQUE: CT of the head was performed without the administration of intravenous contrast. Automated exposure control, iterative reconstruction, and/or weight based adjustment of the mA/kV was utilized to reduce the radiation dose to as low as reasonably achievable. COMPARISON: Brain MRI  02/27/2015. CLINICAL HISTORY: Neuro deficit, acute, stroke suspected. FINDINGS: BRAIN AND VENTRICLES: No acute hemorrhage. No evidence of acute infarct. Old infarcts of the right precentral gyrus and right basal ganglia. Mild chronic ischemic white matter changes. No hydrocephalus. No extra-axial collection. No mass effect or midline shift. [place] Stroke Program Early CT Score (ASPECTS): Ganglionic (caudate, internal capsule, lentiform nucleus, insula, M1-M3): 7. Supraganglionic (M4-M6): 3. Total: 10. ORBITS: No acute abnormality. SINUSES: No acute abnormality. SOFT TISSUES AND SKULL: No acute soft tissue abnormality. No skull fracture. IMPRESSION: 1. No acute intracranial abnormality. ASPECTS: 10 2. Old infarcts of the right precentral gyrus and right basal ganglia. 3. Mild chronic ischemic white matter changes. Findings communicated to Dr. Camellia Shark at 7:59 pm on 10/07/2024. Electronically signed by: Franky Stanford MD 10/07/2024 08:00 PM EST RP Workstation: [id number]     Discharge Exam: Vitals:   10/08/24 1200 10/08/24 1215  BP: 102/78   Pulse: 73   Resp: (!) 21   Temp:  98 F (36.7 C)  SpO2: 94%    Vitals:   10/08/24 1025 10/08/24 1109 10/08/24 1200 10/08/24 1215  BP: 95/76  102/78   Pulse: 78 81 73   Resp: 18 19 (!) 21   Temp:    98 F (36.7 C)  TempSrc:    Oral  SpO2: 96% 96% 94%   Weight:      Height:        General: Pt is alert, awake, not in acute distress Cardiovascular: RRR, S1/S2 +, no rubs, no gallops Respiratory: CTA bilaterally, no wheezing, no rhonchi Abdominal: Soft, NT, ND, bowel sounds + Extremities: no edema, no cyanosis Neuro: Very mild weakness in the left hand.    The results of significant diagnostics from this hospitalization (including imaging, microbiology, ancillary and laboratory) are listed  below for reference.     Microbiology: No results found for this or any previous visit (from the past 240 hours).   Labs: BNP (last 3 results) No results  for input(s): BNP in the last 8760 hours. Basic Metabolic Panel: Recent Labs  Lab 10/07/24 1940 10/07/24 1948 10/08/24 0548  NA 133* 137 135  K 4.8 4.7 4.2  CL 101 102 103  CO2 24  --  24  GLUCOSE 192* 189* 132*  BUN 14 18 15   CREATININE 1.17 1.20 1.02  CALCIUM  8.9  --  9.1  MG 1.6*  --  1.9  PHOS 3.4  --   --    Liver Function Tests: Recent Labs  Lab 10/07/24 1940 10/08/24 0548  AST 170* 124*  ALT 169* 142*  ALKPHOS 152* 110  BILITOT 1.0 0.9  PROT 7.0 6.4*  ALBUMIN 3.5 3.1*   No results for input(s): LIPASE, AMYLASE in the last 168 hours. No results for input(s): AMMONIA in the last 168 hours. CBC: Recent Labs  Lab 10/07/24 1940 10/07/24 1948 10/08/24 0548  WBC 7.0  --  6.4  NEUTROABS 4.4  --   --   HGB 15.3 15.6 14.1  HCT 45.1 46.0 42.1  MCV 94.5  --  95.7  PLT 111*  --  93*   Cardiac Enzymes: Recent Labs  Lab 10/07/24 1940  CKTOTAL 85   BNP: Invalid input(s): POCBNP CBG: Recent Labs  Lab 10/07/24 1943 10/08/24 0006 10/08/24 0227 10/08/24 0734 10/08/24 1200  GLUCAP 190* 117* 141* 144* 137*   D-Dimer No results for input(s): DDIMER in the last 72 hours. Hgb A1c No results for input(s): HGBA1C in the last 72 hours. Lipid Profile Recent Labs    10/08/24 0548  CHOL 104  HDL 39*  LDLCALC 57  TRIG 42  CHOLHDL 2.7   Thyroid  function studies No results for input(s): TSH, T4TOTAL, T3FREE, THYROIDAB in the last 72 hours.  Invalid input(s): FREET3 Anemia work up No results for input(s): VITAMINB12, FOLATE, FERRITIN, TIBC, IRON, RETICCTPCT in the last 72 hours. Urinalysis    Component Value Date/Time   COLORURINE YELLOW (A) 03/24/2020 1058   APPEARANCEUR HAZY (A) 03/24/2020 1058   LABSPEC 1.023 03/24/2020 1058   PHURINE 5.0 03/24/2020 1058   GLUCOSEU 50 (A) 03/24/2020 1058   HGBUR NEGATIVE 03/24/2020 1058   BILIRUBINUR NEGATIVE 03/24/2020 1058   KETONESUR NEGATIVE 03/24/2020 1058   PROTEINUR  NEGATIVE 03/24/2020 1058   NITRITE NEGATIVE 03/24/2020 1058   LEUKOCYTESUR NEGATIVE 03/24/2020 1058   Sepsis Labs Recent Labs  Lab 10/07/24 1940 10/08/24 0548  WBC 7.0 6.4   Microbiology No results found for this or any previous visit (from the past 240 hours).  FURTHER DISCHARGE INSTRUCTIONS:   Get Medicines reviewed and adjusted: Please take all your medications with you for your next visit with your Primary MD   Laboratory/radiological data: Please request your Primary MD to go over all hospital tests and procedure/radiological results at the follow up, please ask your Primary MD to get all Hospital records sent to his/her office.   In some cases, they will be blood work, cultures and biopsy results pending at the time of your discharge. Please request that your primary care M.D. goes through all the records of your hospital data and follows up on these results.   Also Note the following: If you experience worsening of your admission symptoms, develop shortness of breath, life threatening emergency, suicidal or homicidal thoughts you must seek medical attention immediately  by calling 911 or calling your MD immediately  if symptoms less severe.   You must read complete instructions/literature along with all the possible adverse reactions/side effects for all the Medicines you take and that have been prescribed to you. Take any new Medicines after you have completely understood and accpet all the possible adverse reactions/side effects.    patient was instructed, not to drive, operate heavy machinery, perform activities at heights, swimming or participation in water activities or provide baby-sitting services while on Pain, Sleep and Anxiety Medications; until their outpatient Physician has advised to do so again. Also recommended to not to take more than prescribed Pain, Sleep and Anxiety Medications.  It is not advisable to combine anxiety, sleep and pain medications without talking  with your primary care provider.     Wear Seat belts while driving.   Please note: You were cared for by a hospitalist during your hospital stay. Once you are discharged, your primary care physician will handle any further medical issues. Please note that NO REFILLS for any discharge medications will be authorized once you are discharged, as it is imperative that you return to your primary care physician (or establish a relationship with a primary care physician if you do not have one) for your post hospital discharge needs so that they can reassess your need for medications and monitor your lab values  Time coordinating discharge: Over 30 minutes  SIGNED:   Fredia Skeeter, MD  [facility] 10/08/2024, 3:13 PM *Please note that this is a verbal dictation therefore any spelling or grammatical errors are due to the [employer] One system interpretation. If 7PM-7AM, please contact night-coverage [email or web address]

## 2024-10-08 NOTE — ED Notes (Signed)
 PT keeps getting up to go to the bathroom and is unhooking himself from the monitors

## 2024-10-08 NOTE — Progress Notes (Addendum)
 STROKE TEAM PROGRESS NOTE    SIGNIFICANT HOSPITAL EVENTS 10/08/2024: Admitted  INTERIM HISTORY/SUBJECTIVE  Pt reports that he had left hand weakness that started yesterday characterized by being unable to grasp a glass of water without it falling. Reports that this is still the case (and is confirmed by exam). No other reported neurologic concerns. Does not smoke or use illicit drugs. Drinks a 6-pack of beer over the weekend. Is medication complaint. Recently was off ASA for 7 days for colonoscopy, was restarted before December. MRI scan of the brain shows tiny punctate acute to subacute right frontoparietal cortical infarcts. CT angiogram shows calcific 50% proximal right ICA stenosis.  Calcific less than 50% left ICA stenosis Patient also complains of left eye vision difficulty particularly in lower half of vision. OBJECTIVE  CBC    Component Value Date/Time   WBC 6.4 10/08/2024 0548   RBC 4.40 10/08/2024 0548   HGB 14.1 10/08/2024 0548   HGB 15.1 02/27/2015 0645   HCT 42.1 10/08/2024 0548   HCT 43.7 02/27/2015 0645   PLT 93 (L) 10/08/2024 0548   PLT 123 (L) 02/27/2015 0645   MCV 95.7 10/08/2024 0548   MCV 97 02/27/2015 0645   MCH 32.0 10/08/2024 0548   MCHC 33.5 10/08/2024 0548   RDW 12.7 10/08/2024 0548   RDW 12.2 02/27/2015 0645   LYMPHSABS 1.3 10/07/2024 1940   LYMPHSABS 1.8 02/27/2015 0645   MONOABS 0.6 10/07/2024 1940   MONOABS 0.6 02/27/2015 0645   EOSABS 0.5 10/07/2024 1940   EOSABS 0.7 02/27/2015 0645   BASOSABS 0.1 10/07/2024 1940   BASOSABS 0.1 02/27/2015 0645    BMET    Component Value Date/Time   NA 135 10/08/2024 0548   NA 136 02/27/2015 0645   K 4.2 10/08/2024 0548   K 4.0 02/27/2015 0645   CL 103 10/08/2024 0548   CL 106 02/27/2015 0645   CO2 24 10/08/2024 0548   CO2 26 02/27/2015 0645   GLUCOSE 132 (H) 10/08/2024 0548   GLUCOSE 132 (H) 02/27/2015 0645   BUN 15 10/08/2024 0548   BUN 16 02/27/2015 0645   CREATININE 1.02 10/08/2024 0548    CREATININE 1.04 02/27/2015 0645   CREATININE 1.11 03/02/2011 1625   CALCIUM  9.1 10/08/2024 0548   CALCIUM  8.6 (L) 02/27/2015 0645   GFRNONAA >60 10/08/2024 0548   GFRNONAA >60 02/27/2015 0645    IMAGING past 24 hours ECHOCARDIOGRAM COMPLETE Result Date: 10/08/2024    ECHOCARDIOGRAM REPORT   Patient Name:   Cornel G Leverette Date of Exam: 10/08/2024 Medical Rec #:  [id number]       Height:       69.0 in Accession #:    [id number]      Weight:       254.0 lb Date of Birth:  [birth year]       BSA:          2.287 m Patient Age:    58 years        BP:           119/70 mmHg Patient Gender: M               HR:           87 bpm. Exam Location:  Inpatient Procedure: 2D Echo, Cardiac Doppler, Color Doppler and Intracardiac            Opacification Agent (Both Spectral and Color Flow Doppler were            utilized  during procedure). Indications:    Stroke I63.9  History:        Patient has prior history of Echocardiogram examinations, most                 recent 02/14/2024. CAD, Stroke, Arrythmias:non-specific ST                 changes; Risk Factors:Former Smoker, Dyslipidemia, Hypertension                 and Diabetes.  Sonographer:    Koleen Popper RDCS Referring Phys: [id number] ANASTASSIA DOUTOVA  Sonographer Comments: Suboptimal apical window. IMPRESSIONS  1. Left ventricular ejection fraction, by estimation, is 60 to 65%. Left ventricular ejection fraction by 2D MOD biplane is 61.5 %. The left ventricle has normal function. Left ventricular endocardial border not optimally defined to evaluate regional wall motion. Left ventricular diastolic parameters were normal.  2. Right ventricular systolic function is normal. The right ventricular size is mildly enlarged.  3. The mitral valve is normal in structure. No evidence of mitral valve regurgitation. No evidence of mitral stenosis.  4. The aortic valve is normal in structure. Aortic valve regurgitation is not visualized. Aortic valve sclerosis/calcification is present,  without any evidence of aortic stenosis.  5. The inferior vena cava is normal in size with greater than 50% respiratory variability, suggesting right atrial pressure of 3 mmHg. Comparison(s): No significant change from prior study. FINDINGS  Left Ventricle: Left ventricular ejection fraction, by estimation, is 60 to 65%. Left ventricular ejection fraction by 2D MOD biplane is 61.5 %. The left ventricle has normal function. Left ventricular endocardial border not optimally defined to evaluate regional wall motion. Definity  contrast agent was given IV to delineate the left ventricular endocardial borders. The left ventricular internal cavity size was normal in size. There is no left ventricular hypertrophy. Left ventricular diastolic parameters were normal. Right Ventricle: The right ventricular size is mildly enlarged. No increase in right ventricular wall thickness. Right ventricular systolic function is normal. Left Atrium: Left atrial size was normal in size. Right Atrium: Right atrial size was normal in size. Pericardium: There is no evidence of pericardial effusion. Mitral Valve: The mitral valve is normal in structure. No evidence of mitral valve regurgitation. No evidence of mitral valve stenosis. Tricuspid Valve: The tricuspid valve is not well visualized. Tricuspid valve regurgitation is not demonstrated. No evidence of tricuspid stenosis. Aortic Valve: The aortic valve is normal in structure. Aortic valve regurgitation is not visualized. Aortic valve sclerosis/calcification is present, without any evidence of aortic stenosis. Pulmonic Valve: The pulmonic valve was not well visualized. Pulmonic valve regurgitation is not visualized. No evidence of pulmonic stenosis. Aorta: The aortic root is normal in size and structure. Venous: The inferior vena cava is normal in size with greater than 50% respiratory variability, suggesting right atrial pressure of 3 mmHg. IAS/Shunts: No atrial level shunt detected by  color flow Doppler.  LEFT VENTRICLE PLAX 2D                        Biplane EF (MOD) LVIDd:         5.30 cm         LV Biplane EF:   Left LVIDs:         3.20 cm                          ventricular LV PW:  0.90 cm                          ejection LV IVS:        1.20 cm                          fraction by LVOT diam:     2.10 cm                          2D MOD LV SV:         61                               biplane is LV SV Index:   27                               61.5 %. LVOT Area:     3.46 cm                                Diastology                                LV e' medial:    7.83 cm/s LV Volumes (MOD)               LV E/e' medial:  9.0 LV vol d, MOD    53.4 ml       LV e' lateral:   10.60 cm/s A2C:                           LV E/e' lateral: 6.6 LV vol d, MOD    52.3 ml A4C: LV vol s, MOD    18.3 ml A2C: LV vol s, MOD    22.4 ml A4C: LV SV MOD A2C:   35.1 ml LV SV MOD A4C:   52.3 ml LV SV MOD BP:    32.7 ml RIGHT VENTRICLE            IVC RV Basal diam:  4.60 cm    IVC diam: 0.90 cm RV Mid diam:    3.90 cm RV S prime:     9.57 cm/s TAPSE (M-mode): 1.6 cm LEFT ATRIUM             Index        RIGHT ATRIUM           Index LA diam:        4.10 cm 1.79 cm/m   RA Area:     17.80 cm LA Vol (A2C):   42.5 ml 18.58 ml/m  RA Volume:   51.90 ml  22.69 ml/m LA Vol (A4C):   34.1 ml 14.91 ml/m LA Biplane Vol: 39.3 ml 17.18 ml/m  AORTIC VALVE LVOT Vmax:   90.10 cm/s LVOT Vmean:  59.700 cm/s LVOT VTI:    0.177 m  AORTA Ao Root diam: 3.50 cm Ao Asc diam:  3.50 cm MITRAL VALVE MV Area (PHT): 3.72 cm    SHUNTS MV Decel Time: 204 msec    Systemic VTI:  0.18 m MV E velocity: 70.20 cm/s  Systemic Diam: 2.10 cm  MV A velocity: 76.00 cm/s MV E/A ratio:  0.92 Franck Azobou Tonleu Electronically signed by Joelle Cedars Tonleu Signature Date/Time: 10/08/2024/10:21:34 AM    Final    DG CHEST PORT 1 VIEW Result Date: 10/08/2024 CLINICAL DATA:  Left hand weakness. EXAM: PORTABLE CHEST 1 VIEW COMPARISON:  September 26, 2023  FINDINGS: The heart size and mediastinal contours are within normal limits. Both lungs are clear. Multilevel degenerative changes are noted throughout the thoracic spine. IMPRESSION: No active disease. Electronically Signed   By: Suzen Dials M.D.   On: 10/08/2024 01:06   CT ANGIO HEAD NECK W WO CM Result Date: 10/08/2024 EXAM: CTA HEAD AND NECK WITH AND WITHOUT 10/07/2024 11:51:49 PM TECHNIQUE: CTA of the head and neck was performed with and without the administration of 75 mL of iohexol (OMNIPAQUE) 350 MG/ML injection. Multiplanar 2D and/or 3D reformatted images are provided for review. Automated exposure control, iterative reconstruction, and/or weight based adjustment of the mA/kV was utilized to reduce the radiation dose to as low as reasonably achievable. Stenosis of the internal carotid arteries measured using NASCET criteria. COMPARISON: None available CLINICAL HISTORY: Neuro deficit, acute, stroke suspected. FINDINGS: CTA NECK: AORTIC ARCH AND ARCH VESSELS: No dissection or arterial injury. No significant stenosis of the brachiocephalic or subclavian arteries. CERVICAL CAROTID ARTERIES: Right carotid bifurcation and proximal ICA: Mixed density atherosclerotic disease with atherosclerotic bifurcation with 50% stenosis of the proximal ICA. Left carotid bifurcation and proximal ICA: Calcific atherosclerosis with less than 50% stenosis. No dissection or arterial injury. CERVICAL VERTEBRAL ARTERIES: No dissection, arterial injury, or significant stenosis. LUNGS AND MEDIASTINUM: Unremarkable. SOFT TISSUES: No acute abnormality. BONES: No acute abnormality. CTA HEAD: ANTERIOR CIRCULATION: Internal carotid arteries: Calcific atherosclerosis of both carotid siphons without high-grade stenosis. Anterior cerebral arteries: No significant stenosis. Middle cerebral arteries: No significant stenosis. No aneurysm. POSTERIOR CIRCULATION: Posterior cerebral arteries: No significant stenosis. Basilar artery: No  significant stenosis. Vertebral arteries: No significant stenosis. No aneurysm. OTHER: No dural venous sinus thrombosis on this non-dedicated study. IMPRESSION: 1. No emergent large vessel occlusion. 2. Mixed-density atherosclerotic disease with approximately 50% stenosis of the right proximal ICA. 3. Calcific atherosclerosis at the left carotid bifurcation and proximal ICA with less than 50% stenosis. 4. Calcific atherosclerosis of both carotid siphons without high-grade stenosis. Electronically signed by: Franky Stanford MD 10/08/2024 12:22 AM EST RP Workstation: [id number]   MR BRAIN WO CONTRAST Addendum Date: 10/08/2024 ADDENDUM #1 ADDENDUM: While the intermediate signal intensity of the frontoparietal infarct is consistent with acute to early subacute ischemia, given the reported timeline of symptoms beginning around 6pm, an acute timeline is certainly possible. The statement No acute intracranial abnormality was included by mistake in the original impression. ---------------------------------------------------- Electronically signed by: Franky Stanford MD 10/08/2024 12:19 AM EST RP Workstation: [id number]   Result Date: 10/08/2024 ORIGINAL REPORT EXAM: MRI BRAIN WITHOUT CONTRAST MRI CERVICAL SPINE WITHOUT CONTRAST 10/07/2024 09:07:35 PM TECHNIQUE: Multiplanar multisequence MRI of the brain was performed without the administration of intravenous contrast. Multiplanar multisequence MRI of the cervical spine was performed without the administration of intravenous contrast. COMPARISON: Brain MRI 02/27/2015. CLINICAL HISTORY: Neuro deficit, acute, stroke suspected. FINDINGS: MRI BRAIN: BRAIN AND VENTRICLES: Multifocal early subacute ischemia at the right frontoparietal junction affecting the precentral and postcentral gyri. Test magnetic susceptibility artifact in the region of the ischemia, which could represent a small amount of distal thromboembolic material. Multifocal hyperintense T2-weighted signal within  the cerebral white matter, most commonly due to chronic small vessel disease. No acute intracranial hemorrhage.  No mass or abnormal enhancement. No midline shift. No hydrocephalus. The sella is unremarkable. Normal flow voids. ORBITS: No acute abnormality. SINUSES AND MASTOIDS: No acute abnormality. BONES AND SOFT TISSUES: Normal bone marrow signal. No acute soft tissue abnormality. MRI CERVICAL SPINE: BONES AND ALIGNMENT: Normal alignment. Normal vertebral body heights. Marrow signal is unremarkable. No abnormal enhancement. SPINAL CORD: Normal spinal cord size. Normal spinal cord signal. SOFT TISSUES: Unremarkable. C2-C3: Small left subarticular disc protrusion with bilateral uncovertebral spurring. No central spinal canal or neural foraminal stenosis. C3-C4: No significant disc herniation. No spinal canal stenosis or neural foraminal narrowing. C4-C5: No significant disc herniation. No spinal canal stenosis or neural foraminal narrowing. C5-C6: No significant disc herniation. No spinal canal stenosis or neural foraminal narrowing. C6-C7: No significant disc herniation. No spinal canal stenosis or neural foraminal narrowing. C7-T1: No significant disc herniation. No spinal canal stenosis or neural foraminal narrowing. IMPRESSION: 1. No acute intracranial abnormality. 2. Multifocal early subacute ischemia at the right frontoparietal junction affecting the precentral and postcentral gyri. 3. Multifocal cerebral white matter T2 hyperintensities most consistent with chronic small vessel disease. 4. C2-C3 small left subarticular disc protrusion with bilateral uncovertebral spurring, without central spinal canal or neural foraminal stenosis. Electronically signed by: Franky Stanford MD 10/07/2024 10:02 PM EST RP Workstation: [id number]   MR CERVICAL SPINE WO CONTRAST Addendum Date: 10/08/2024 ADDENDUM #1 ADDENDUM: While the intermediate signal intensity of the frontoparietal infarct is consistent with acute to early  subacute ischemia, given the reported timeline of symptoms beginning around 6pm, an acute timeline is certainly possible. The statement No acute intracranial abnormality was included by mistake in the original impression. ---------------------------------------------------- Electronically signed by: Franky Stanford MD 10/08/2024 12:19 AM EST RP Workstation: [id number]   Result Date: 10/08/2024 ORIGINAL REPORT EXAM: MRI BRAIN WITHOUT CONTRAST MRI CERVICAL SPINE WITHOUT CONTRAST 10/07/2024 09:07:35 PM TECHNIQUE: Multiplanar multisequence MRI of the brain was performed without the administration of intravenous contrast. Multiplanar multisequence MRI of the cervical spine was performed without the administration of intravenous contrast. COMPARISON: Brain MRI 02/27/2015. CLINICAL HISTORY: Neuro deficit, acute, stroke suspected. FINDINGS: MRI BRAIN: BRAIN AND VENTRICLES: Multifocal early subacute ischemia at the right frontoparietal junction affecting the precentral and postcentral gyri. Test magnetic susceptibility artifact in the region of the ischemia, which could represent a small amount of distal thromboembolic material. Multifocal hyperintense T2-weighted signal within the cerebral white matter, most commonly due to chronic small vessel disease. No acute intracranial hemorrhage. No mass or abnormal enhancement. No midline shift. No hydrocephalus. The sella is unremarkable. Normal flow voids. ORBITS: No acute abnormality. SINUSES AND MASTOIDS: No acute abnormality. BONES AND SOFT TISSUES: Normal bone marrow signal. No acute soft tissue abnormality. MRI CERVICAL SPINE: BONES AND ALIGNMENT: Normal alignment. Normal vertebral body heights. Marrow signal is unremarkable. No abnormal enhancement. SPINAL CORD: Normal spinal cord size. Normal spinal cord signal. SOFT TISSUES: Unremarkable. C2-C3: Small left subarticular disc protrusion with bilateral uncovertebral spurring. No central spinal canal or neural foraminal  stenosis. C3-C4: No significant disc herniation. No spinal canal stenosis or neural foraminal narrowing. C4-C5: No significant disc herniation. No spinal canal stenosis or neural foraminal narrowing. C5-C6: No significant disc herniation. No spinal canal stenosis or neural foraminal narrowing. C6-C7: No significant disc herniation. No spinal canal stenosis or neural foraminal narrowing. C7-T1: No significant disc herniation. No spinal canal stenosis or neural foraminal narrowing. IMPRESSION: 1. No acute intracranial abnormality. 2. Multifocal early subacute ischemia at the right frontoparietal junction affecting the precentral and postcentral gyri. 3. Multifocal  cerebral white matter T2 hyperintensities most consistent with chronic small vessel disease. 4. C2-C3 small left subarticular disc protrusion with bilateral uncovertebral spurring, without central spinal canal or neural foraminal stenosis. Electronically signed by: Franky Stanford MD 10/07/2024 10:02 PM EST RP Workstation: [id number]   CT HEAD CODE STROKE WO CONTRAST (LKW 0-4.5h, LVO 0-24h) Result Date: 10/07/2024 EXAM: CT HEAD WITHOUT CONTRAST 10/07/2024 07:50:42 PM TECHNIQUE: CT of the head was performed without the administration of intravenous contrast. Automated exposure control, iterative reconstruction, and/or weight based adjustment of the mA/kV was utilized to reduce the radiation dose to as low as reasonably achievable. COMPARISON: Brain MRI 02/27/2015. CLINICAL HISTORY: Neuro deficit, acute, stroke suspected. FINDINGS: BRAIN AND VENTRICLES: No acute hemorrhage. No evidence of acute infarct. Old infarcts of the right precentral gyrus and right basal ganglia. Mild chronic ischemic white matter changes. No hydrocephalus. No extra-axial collection. No mass effect or midline shift. [place] Stroke Program Early CT Score (ASPECTS): Ganglionic (caudate, internal capsule, lentiform nucleus, insula, M1-M3): 7. Supraganglionic (M4-M6): 3. Total: 10. ORBITS:  No acute abnormality. SINUSES: No acute abnormality. SOFT TISSUES AND SKULL: No acute soft tissue abnormality. No skull fracture. IMPRESSION: 1. No acute intracranial abnormality. ASPECTS: 10 2. Old infarcts of the right precentral gyrus and right basal ganglia. 3. Mild chronic ischemic white matter changes. Findings communicated to Dr. Camellia Shark at 7:59 pm on 10/07/2024. Electronically signed by: Franky Stanford MD 10/07/2024 08:00 PM EST RP Workstation: [id number]    Vitals:   10/08/24 1021 10/08/24 1022 10/08/24 1025 10/08/24 1025  BP:    95/76  Pulse: 75 78  78  Resp: 16 19  18   Temp:   97.9 F (36.6 C)   TempSrc:   Oral   SpO2: 98% 98%  96%  Weight:      Height:         PHYSICAL EXAM General:  Alert, well-nourished, well-developed patient in no acute distress Psych:  Mood and affect appropriate for situation CV: Regular rate and rhythm on monitor Respiratory:  Regular, unlabored respirations on room air GI: Abdomen soft and nontender   NEURO:  Mental Status: AA&Ox3, patient is able to give clear and coherent history Speech/Language: speech is without dysarthria or aphasia.  Naming, repetition, fluency, and comprehension intact.  Cranial Nerves:  II: PERRL.  Left eye lower portion altitudinal visual field loss. III, IV, VI: EOMI. Eyelids elevate symmetrically.  V: Sensation is intact to light touch and symmetrical to face.  VII: Face is symmetrical resting and smiling VIII: hearing intact to voice. IX, X: Palate elevates symmetrically. Phonation is normal.  KP:Dynloizm shrug 5/5. XII: tongue is midline without fasciculations. Motor: 5/5 strength to all muscle groups tested except the L hand. Opposition in L hand is very weak from thumb to any other digit, and lumbricals are weak as well. True in both wrist flexion and extension. Tone: is normal and bulk is normal Sensation- Intact to light touch bilaterally. Extinction absent to light touch to DSS.   Coordination: FTN  intact bilaterally, HKS: no ataxia in BLE.No drift.  Gait- deferred  NIH 1    ASSESSMENT/PLAN  Mr. BOBIE KISTLER is a 58 y.o. 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 Admitted for CVA.  NIH on Admission 2.  Acute Ischemic Infarct: R Parietal lobe Etiology:  Suspected  to be from R ICA calcification stenosis which embolized into the R parietal lobe. Code Stroke CT head No acute abnormality. Old infarcts of R precentral gyrus and R basal ganglia seen. Mild chronic ischemic white mater changes. ASPECTS 10.    CTA head & neck: No large vessel occlusion, 50% stenosis of the R proximal ICA, Calcific atherosclerosis at the L carotid bifuraction and proximal ICA w/ <50% stenosis. MRI: No significant spinal canal stenosis, chronic small vessel disease, Ischemia at the R frontoparietal junction. 2D Echo: EF 60-65%, R ventricle mildly enlarged.  Left atrial size normal LDL 57 HgbA1c 6.1 aspirin  81 mg daily prior to admission, now on aspirin  81 mg daily and clopidogrel  75 mg daily for 3 weeks and then Asprin 81mg  daily alone. Therapy recommendations:  Pending Disposition: Pending therapy recommendations  Atrial fibrillation No history of A-fib Home Meds: none Continue telemetry monitoring Recommend 30-day heart monitor at discharge. Have spoken with cards master, they will mail it to pt post-discharge.  Hypertension Pt reports BP is well controlled on current regimen Home meds:  Norvasc  5mg , Valsartan  80mg  daily, Hydrochlorothiazide  25mg  daily Stable Blood Pressure Goal: SBP less than 160   Hyperlipidemia Home meds:  Repatha  140mg  q14 days LDL 57, goal < 70 Continue statin at discharge  Diabetes type II - Controlled Home meds: Glipizide , Metformin , Mounjaro ,  HgbA1c 6.1, goal < 7.0  Tobacco  Abuse - former Nicotine replacement therapy provided  Substance Abuse Patient drinks a 6-pack on the weekends, counselled to decrease his usage.  UDS negative  Dysphagia Patient has post-stroke dysphagia, SLP consulted    Diet   Diet Heart Room service appropriate? Yes; Fluid consistency: Thin  Advance diet as tolerated  Other Stroke Risk Factors ETOH use, alcohol level <15, advised to drink no more than 2 drink(s) a day Obesity, Body mass index is 37.51 kg/m., BMI >/= 30 associated with increased stroke risk, recommend weight loss, diet and exercise as appropriate  Obstructive sleep apnea, pt considering getting tested while in the hospital, pending their decision. Not on CPAP at home  Other Active Problems   Hospital day # 0  D. Penne Mori, DO, PGY1 Neurology Stroke Team  I have personally obtained history,examined this patient, reviewed notes, independently viewed imaging studies, participated in medical decision making and plan of care.ROS completed by me personally and pertinent positives fully documented  I have made any additions or clarifications directly to the above note. Agree with note above.  Patient presented with left hand weakness and paresthesias due to embolic small right frontoparietal cortical infarcts likely embolic from moderate calcific proximal right ICA stenosis though paroxysmal A-fib may also be possible.  He also has left eye lower field vision loss which may be from retinal artery branch occlusion or retinal or vitreous pathology and will need outpatient ophthalmology follow-up.  Recommend dual antiplatelet therapy aspirin  and Plavix  for 3 weeks followed by Plavix  alone.  30-day heart monitor at discharge to look for paroxysmal A-fib.  Aggressive risk factor modification.  Recommend outpatient follow-up with ophthalmology for left eye vision loss No family available at bedside for discussion.  Discussed with Dr. Fredia   I personally spent a total of 50  minutes in the care of the patient today including getting/reviewing separately obtained history, performing a medically appropriate exam/evaluation, counseling and educating, placing orders, referring and communicating with other health care professionals, documenting clinical information in the EHR, independently interpreting results, and coordinating care.        Omayra Tulloch  Rosemarie, MD Medical Director [facility] Pager: [phone] 10/08/2024 1:50 PM   To contact Stroke Continuity provider, please refer to [email or web address]. After hours, contact General Neurology

## 2024-10-08 NOTE — Care Management (Signed)
  Transition of Care [facility]) Screening Note   Patient Details  Name: Daniel [name] Date of Birth: [birth year]   Transition of Care [facility]) CM/SW Contact:    Daniel JAYSON Canary, RN Phone Number: 10/08/2024, 5:18 PM    Transition of Care Department [facility]) has reviewed patient and no TOC needs have been identified at this time. We will continue to monitor patient advancement through interdisciplinary progression rounds. If new patient transition needs arise, please place a TOC consult.

## 2024-10-08 NOTE — Assessment & Plan Note (Signed)
 In remission.

## 2024-10-08 NOTE — Progress Notes (Signed)
 30d monitor ordered for stroke. Follow up appt arranged.

## 2024-10-09 ENCOUNTER — Ambulatory Visit: Admitting: Pharmacist

## 2024-10-13 ENCOUNTER — Other Ambulatory Visit: Payer: Self-pay | Admitting: Family Medicine

## 2024-10-16 ENCOUNTER — Ambulatory Visit: Admitting: Family Medicine

## 2024-10-16 ENCOUNTER — Encounter: Payer: Self-pay | Admitting: Family Medicine

## 2024-10-16 VITALS — BP 124/62 | HR 61 | Temp 97.9°F | Ht 69.0 in | Wt 253.1 lb

## 2024-10-16 DIAGNOSIS — E1169 Type 2 diabetes mellitus with other specified complication: Secondary | ICD-10-CM

## 2024-10-16 DIAGNOSIS — R12 Heartburn: Secondary | ICD-10-CM | POA: Diagnosis not present

## 2024-10-16 DIAGNOSIS — M25562 Pain in left knee: Secondary | ICD-10-CM | POA: Diagnosis not present

## 2024-10-16 DIAGNOSIS — E785 Hyperlipidemia, unspecified: Secondary | ICD-10-CM | POA: Diagnosis not present

## 2024-10-16 DIAGNOSIS — D696 Thrombocytopenia, unspecified: Secondary | ICD-10-CM

## 2024-10-16 DIAGNOSIS — I10 Essential (primary) hypertension: Secondary | ICD-10-CM | POA: Diagnosis not present

## 2024-10-16 DIAGNOSIS — I639 Cerebral infarction, unspecified: Secondary | ICD-10-CM

## 2024-10-16 DIAGNOSIS — M25561 Pain in right knee: Secondary | ICD-10-CM | POA: Diagnosis not present

## 2024-10-16 DIAGNOSIS — R7401 Elevation of levels of liver transaminase levels: Secondary | ICD-10-CM

## 2024-10-16 DIAGNOSIS — Z8673 Personal history of transient ischemic attack (TIA), and cerebral infarction without residual deficits: Secondary | ICD-10-CM

## 2024-10-16 DIAGNOSIS — G8929 Other chronic pain: Secondary | ICD-10-CM

## 2024-10-16 DIAGNOSIS — E1165 Type 2 diabetes mellitus with hyperglycemia: Secondary | ICD-10-CM

## 2024-10-16 DIAGNOSIS — K760 Fatty (change of) liver, not elsewhere classified: Secondary | ICD-10-CM | POA: Diagnosis not present

## 2024-10-16 LAB — CBC WITH DIFFERENTIAL/PLATELET
Basophils Absolute: 0 K/uL (ref 0.0–0.1)
Basophils Relative: 0.8 % (ref 0.0–3.0)
Eosinophils Absolute: 0.5 K/uL (ref 0.0–0.7)
Eosinophils Relative: 9 % — ABNORMAL HIGH (ref 0.0–5.0)
HCT: 45 % (ref 39.0–52.0)
Hemoglobin: 15.2 g/dL (ref 13.0–17.0)
Lymphocytes Relative: 20.6 % (ref 12.0–46.0)
Lymphs Abs: 1.1 K/uL (ref 0.7–4.0)
MCHC: 33.8 g/dL (ref 30.0–36.0)
MCV: 95.6 fl (ref 78.0–100.0)
Monocytes Absolute: 0.6 K/uL (ref 0.1–1.0)
Monocytes Relative: 10.4 % (ref 3.0–12.0)
Neutro Abs: 3.2 K/uL (ref 1.4–7.7)
Neutrophils Relative %: 59.2 % (ref 43.0–77.0)
Platelets: 83 K/uL — ABNORMAL LOW (ref 150.0–400.0)
RBC: 4.7 Mil/uL (ref 4.22–5.81)
RDW: 13.2 % (ref 11.5–15.5)
WBC: 5.4 K/uL (ref 4.0–10.5)

## 2024-10-16 LAB — HEPATIC FUNCTION PANEL
ALT: 127 U/L — ABNORMAL HIGH (ref 0–53)
AST: 98 U/L — ABNORMAL HIGH (ref 0–37)
Albumin: 4.1 g/dL (ref 3.5–5.2)
Alkaline Phosphatase: 181 U/L — ABNORMAL HIGH (ref 39–117)
Bilirubin, Direct: 0.2 mg/dL (ref 0.0–0.3)
Total Bilirubin: 0.8 mg/dL (ref 0.2–1.2)
Total Protein: 7.5 g/dL (ref 6.0–8.3)

## 2024-10-16 MED ORDER — FAMOTIDINE 20 MG PO TABS: 20.0000 mg | ORAL_TABLET | Freq: Two times a day (BID) | ORAL | 2 refills | Status: AC

## 2024-10-16 NOTE — Progress Notes (Signed)
 Subjective:    Patient ID: Daniel [name], male    DOB: [birth year], 58 y.o.   MRN: [id number]  HPI  Wt Readings from Last 3 Encounters:  10/16/24 253 lb 2 oz (114.8 kg)  10/07/24 254 lb (115.2 kg)  09/18/24 260 lb (117.9 kg)   37.38 kg/m  Vitals:   10/16/24 1000  BP: 124/62  Pulse: 61  Temp: 97.9 F (36.6 C)  SpO2: 98%   Pt presents for follow up of hosp for  CVA  Presented with left arm/hand weakness   MRI- noted stroke  MR BRAIN WO CONTRAST IMPRESSION: 1. No acute intracranial abnormality. 2. Multifocal early subacute ischemia at the right frontoparietal junction affecting the precentral and postcentral gyri. 3. Multifocal cerebral white matter T2 hyperintensities most consistent with chronic small vessel disease. 4. C2-C3 small left subarticular disc protrusion with bilateral uncovertebral spurring, without central spinal canal or neural foraminal stenosis.   Was not tPA candidate  CT -no pathology   Lab Results  Component Value Date   WBC 5.4 10/16/2024   HGB 15.2 10/16/2024   HCT 45.0 10/16/2024   MCV 95.6 10/16/2024   PLT 83.0 (L) 10/16/2024   Lab Results  Component Value Date   NA 135 10/08/2024   K 4.2 10/08/2024   CO2 24 10/08/2024   GLUCOSE 132 (H) 10/08/2024   BUN 15 10/08/2024   CREATININE 1.02 10/08/2024   CALCIUM  9.1 10/08/2024   GFR 83.03 04/10/2024   GFRNONAA >60 10/08/2024   Lab Results  Component Value Date   ALT 127 (H) 10/16/2024   AST 98 (H) 10/16/2024   ALKPHOS 181 (H) 10/16/2024   BILITOT 0.8 10/16/2024   On repatha   Lab Results  Component Value Date   CHOL 104 10/08/2024   HDL 39 (L) 10/08/2024   LDLCALC 57 10/08/2024   TRIG 42 10/08/2024   CHOLHDL 2.7 10/08/2024   Mag 1.6 HIV screen neg   Echocardiogram EF 60-65%   Lab Results  Component Value Date   HGBA1C 7.0 (H) 10/08/2024   HGBA1C 6.1 (A) 04/10/2024   HGBA1C 9.1 (H) 10/23/2023     Neurology recommended asa and plavix   Change to plavix  alone  after 21 d  Overall getting some strength back  Doing some exercises and getting stronger  Can tie his shoes now - has to work hard at that   [employer] 21 days Plavix  daily   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, occupational    HTN bp is stable today  No cp or palpitations or headaches or edema  No side effects to medicines  BP Readings from Last 3 Encounters:  10/16/24 124/62  10/08/24 110/80  09/18/24 130/64     Lab Results  Component Value Date   NA 135 10/08/2024   K 4.2 10/08/2024   CO2 24 10/08/2024   GLUCOSE 132 (H) 10/08/2024   BUN 15 10/08/2024   CREATININE 1.02 10/08/2024   CALCIUM  9.1 10/08/2024   GFR 83.03 04/10/2024   GFRNONAA >60 10/08/2024  Amlodipine  5 mg daily Hydrochlorothiazide  25 mg daily  Valsartan  80 mg daily   DM2 Eating fair / not as well as he was 6 months ago  Metformin  1000 mg bid Mounjaro  12.5 mg   (has made him a little dizzy occational-apprehensive to go up on dose)  Glipizide  xl 2.5 mg daily   Once had glucose of 77 ,   if it goes down again will hold glipizide    More heartburn  lately  Patient Active Problem List   Diagnosis Date Noted   Heartburn 10/16/2024   Bilateral knee pain 10/16/2024   DM2 (diabetes mellitus, type 2) (HCC) 10/08/2024   Stroke-like symptoms 10/08/2024   Cellulitis of left arm 06/29/2024   Motion sickness 04/10/2024   Branch retinal artery occlusion 01/15/2024   Hepatic steatosis 11/08/2023   Callus of foot 10/23/2023   Angina pectoris 09/26/2023   Morbid obesity (HCC) 04/14/2021   H/O total hip arthroplasty 03/30/2020   Status post total replacement of left hip 06/03/2019   Thrombocytopenia 12/31/2018   Left hip pain 12/15/2018   Type 2 diabetes mellitus with hyperglycemia, without long-term current use of insulin  (HCC) 07/22/2017   Elevated transaminase level 04/16/2016   Coronary artery disease involving native coronary artery of native heart without angina pectoris 03/20/2016   History of non-ST  elevation myocardial infarction (NSTEMI) 03/20/2016   Hyperlipidemia associated with type 2 diabetes mellitus (HCC) 03/12/2016   NSTEMI (non-ST elevated myocardial infarction) (HCC) 03/10/2016   Prostate cancer screening 02/10/2016   H/O: CVA (cerebrovascular accident) 03/01/2015   Right knee pain 04/06/2014   BPH (benign prostatic hyperplasia) 11/16/2013   Routine general medical examination at a health care facility 11/12/2013   B12 deficiency 09/29/2007   ERECTILE DYSFUNCTION 07/01/2007   History of tobacco abuse 07/01/2007   LOW BACK PAIN, CHRONIC 07/01/2007   Primary hypertension 06/03/2007   Past Medical History:  Diagnosis Date   Arthritis    CAD (coronary artery disease)    a. NSTEMI: 100% stenosis of the distal RCA --> DES placed 5/17 CLEARED BY CARDIOLOGIST   Chronic low back pain    a. s/p back surgery.   CVA (cerebral infarction)    a. 02/2015 dyarthria 2/2 CVA involving the lateral aspect of the precentral gyrus.   DM2 (diabetes mellitus, type 2) (HCC)    a. pre-diabetic in the past. b. A1c 03/2016 elevated to 7.9.   ETOH abuse    GERD (gastroesophageal reflux disease)    History of echocardiogram    a. Mild LVH, EF 55-60%, Definity  contrast used-LV wall motion could not be adequately assessed   History of non-ST elevation myocardial infarction (NSTEMI) 03/2016   a. PCI: 3.5 x 24 mm Promus Premier DES to distal RCA   History of tobacco abuse    Hypertension    a. 02/2015 echo: 55-65%, trace TR/MR. b. 03/2016: echo with EF of 55-60%.    Myocardial infarction [facility])    Obesity    Ruptured disk    Stroke [facility])    2016   Past Surgical History:  Procedure Laterality Date   CARDIAC CATHETERIZATION  05/2004   minimal CAD   CARDIAC CATHETERIZATION N/A 03/10/2016   Procedure: Left Heart Cath and Coronary Angiography;  Surgeon: Lonni JONETTA Cash, MD;  Location: [facility] INVASIVE CV LAB;  Service: Cardiovascular;  Laterality: N/A;   CARDIAC CATHETERIZATION N/A 03/10/2016    Procedure: Coronary Stent Intervention;  Surgeon: Lonni JONETTA Cash, MD;  Location: [facility] INVASIVE CV LAB;  Service: Cardiovascular;  Laterality: N/A;   CATARACT EXTRACTION W/PHACO Left 07/12/2016   Procedure: CATARACT EXTRACTION PHACO AND INTRAOCULAR LENS PLACEMENT (IOC);  Surgeon: Adine Oneil Novak, MD;  Location: [facility] ORS;  Service: Ophthalmology;  Laterality: Left;  US   00:50 AP% 9.2 CDE 4.63 fluid pack lot # [id number]   CATARACT EXTRACTION W/PHACO Right 10/23/2021   Procedure: CATARACT EXTRACTION PHACO AND INTRAOCULAR LENS PLACEMENT (IOC) RIGHT DIABETIC;  Surgeon: Novak Adine Oneil, MD;  Location: [facility];  Service: Ophthalmology;  Laterality: Right;  Diabetic 1.15 00:16.4   CORONARY ANGIOPLASTY     STENT 5/17   EYE SURGERY     JOINT REPLACEMENT     LUMBAR FUSION  2000   ruptured disk, L5   SHOULDER ARTHROSCOPY WITH ROTATOR CUFF REPAIR Left    TOTAL HIP ARTHROPLASTY Left 06/03/2019   Procedure: TOTAL HIP ARTHROPLASTY;  Surgeon: Mardee Lynwood SQUIBB, MD;  Location: [facility] ORS;  Service: Orthopedics;  Laterality: Left;   TOTAL HIP ARTHROPLASTY Right 03/30/2020   Procedure: TOTAL HIP ARTHROPLASTY;  Surgeon: Mardee Lynwood SQUIBB, MD;  Location: [facility] ORS;  Service: Orthopedics;  Laterality: Right;  posterior    Social History[1] Family History  Problem Relation Age of Onset   Dementia Mother    Hypertension Father    Heart disease Father        MI   Heart attack Father    Lung cancer Maternal Grandmother    Heart failure Paternal Grandfather    Allergies[2] Medications Ordered Prior to Encounter[3]  Review of Systems  Constitutional:  Positive for fatigue. Negative for activity change, appetite change, fever and unexpected weight change.  HENT:  Negative for congestion, rhinorrhea, sore throat and trouble swallowing.   Eyes:  Negative for pain, redness, itching and visual disturbance.  Respiratory:  Negative for cough, chest tightness, shortness of breath and wheezing.    Cardiovascular:  Negative for chest pain and palpitations.  Gastrointestinal:  Negative for abdominal pain, blood in stool, constipation, diarrhea and nausea.  Endocrine: Negative for cold intolerance, heat intolerance, polydipsia and polyuria.  Genitourinary:  Negative for difficulty urinating, dysuria, frequency and urgency.  Musculoskeletal:  Negative for arthralgias, joint swelling and myalgias.  Skin:  Negative for pallor and rash.  Neurological:  Positive for weakness. Negative for dizziness, tremors, numbness and headaches.       Left hand weakness is improving  Hematological:  Negative for adenopathy. Does not bruise/bleed easily.  Psychiatric/Behavioral:  Negative for decreased concentration and dysphoric mood. The patient is not nervous/anxious.        Objective:   Physical Exam Constitutional:      General: He is not in acute distress.    Appearance: Normal appearance. He is well-developed. He is obese. He is not ill-appearing.  HENT:     Head: Normocephalic and atraumatic.  Eyes:     Conjunctiva/sclera: Conjunctivae normal.     Pupils: Pupils are equal, round, and reactive to light.  Neck:     Thyroid : No thyromegaly.     Vascular: No carotid bruit or JVD.  Cardiovascular:     Rate and Rhythm: Normal rate and regular rhythm.     Heart sounds: Normal heart sounds.     No gallop.  Pulmonary:     Effort: Pulmonary effort is normal. No respiratory distress.     Breath sounds: Normal breath sounds. No wheezing or rales.  Abdominal:     General: There is no distension or abdominal bruit.     Palpations: Abdomen is soft.  Musculoskeletal:     Cervical back: Normal range of motion and neck supple.     Right lower leg: No edema.     Left lower leg: No edema.  Lymphadenopathy:     Cervical: No cervical adenopathy.  Skin:    General: Skin is warm and dry.     Coloration: Skin is not pale.     Findings: No rash.  Neurological:     Mental Status: He is  alert.      Cranial Nerves: No cranial nerve deficit.     Motor: Weakness present.     Coordination: Coordination normal.     Gait: Gait normal.     Deep Tendon Reflexes: Reflexes are normal and symmetric. Reflexes normal.     Comments: Left hand weakness/ grip noted  Per pt improved   No facial droop or speech change   Psychiatric:        Mood and Affect: Mood normal.           Assessment & Plan:   Problem List Items Addressed This Visit       Cardiovascular and Mediastinum   Primary hypertension   With recent small vessel CVA bp in fair control at this time  BP Readings from Last 1 Encounters:  10/16/24 124/62   No changes needed Most recent labs reviewed  Disc lifstyle change with low sodium diet and exercise   Continues Amlodipine  5 mg daily Hydrochlorothiazide  25 mg daily  Valsartan  80 mg daily         Digestive   Hepatic steatosis   Lab today   Per liver bx  Your liver biopsy is consistent with fatty liver.  The Brunt classification is used to grade the severity of fatty liver & fibrosis. Your Brunt classification as necroinflammatory grade 2 of 3 and fibrosis stage 2-3 of 4.        Endocrine   Type 2 diabetes mellitus with hyperglycemia, without long-term current use of insulin  (HCC)   Lab Results  Component Value Date   HGBA1C 7.0 (H) 10/08/2024   HGBA1C 6.1 (A) 04/10/2024   HGBA1C 9.1 (H) 10/23/2023   Metformin  1000 mg bid Glipizide  xl 2.5 mg daily-hold for low glucose Mounjaro  12.5 mg weekly (holding at this dose due to side effects of dizziness)   Has been off track with diet-plans to be better now      Hyperlipidemia associated with type 2 diabetes mellitus (HCC)   Disc goals for lipids and reasons to control them Rev last labs with pt Rev low sat fat diet in detail Recent CVA/likely small vessel    Taking repatha   Under cardiology care          Hematopoietic and Hemostatic   Thrombocytopenia   Re check today Last plt 93  On asa and  plavix  for cva Changing over to plavix  alone       Relevant Orders   CBC with Differential/Platelet (Completed)     Other   Morbid obesity (HCC)   Discussed how this problem influences overall health and the risks it imposes  Reviewed plan for weight loss with lower calorie diet (via better food choices (lower glycemic and portion control) along with exercise building up to or more than 30 minutes 5 days per week including some aerobic activity and strength training    Continues mounjaro  12.5 mg weekly       Heartburn   Worsened lately  On glp-1   Prescription pepcid  20 mg bid  Update if not improved with this  Encouraged to avoid triggers       H/O: CVA (cerebrovascular accident) - Primary   Recent right frontoparietal CVA , likely from small vessel dz  Reviewed hospital records, lab results and studies in detail  Was not tPA candidate Normal CT  Left hand symptoms continue to improve/getting stronger  Reassuring echo Reassuring carotid study prior but on CTA 50 % stenosis on right  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  Work to control DM, cholesterol and blood pressure  Reassuring exam Ref to neuro   Continues asa and plavix   Change to plavix  alone after day 21      Relevant Orders   Ambulatory referral to Neurology   Elevated transaminase level   From steatosis - re check today  On GLP - working on weight loss       Relevant Orders   Hepatic function panel (Completed)   Bilateral knee pain   Ongoing  Has seen Dr Mardee in past  Instructed to stop celebrex  due to new plavix   (bleeding risk)  Pt states he cannot tolerate being off of it Voltaren gel may help  Tylenol  -pending liver tests Suspect injection may help-encouraged follow up with ortho         [1]  Social History Tobacco Use   Smoking status: Former    Current packs/day: 0.00    Types: Cigarettes    Quit date: 01/18/2015    Years since quitting: 9.7   Smokeless  tobacco: Never  Vaping Use   Vaping status: Never Used  Substance Use Topics   Alcohol use: Yes    Alcohol/week: 12.0 standard drinks of alcohol    Types: 12 Cans of beer per week    Comment: weekends   Drug use: No  [2]  Allergies Allergen Reactions   Ace Inhibitors Cough  [3]  Current Outpatient Medications on File Prior to Visit  Medication Sig Dispense Refill   amLODipine  (NORVASC ) 5 MG tablet TAKE 1 TABLET (5 MG TOTAL) BY MOUTH DAILY. 90 tablet 1   aspirin  EC 81 MG tablet Take 1 tablet (81 mg total) by mouth daily for 21 days. Swallow whole. 21 tablet 0   celecoxib  (CELEBREX ) 200 MG capsule Take 200 mg by mouth daily.     clopidogrel  (PLAVIX ) 75 MG tablet Take 1 tablet (75 mg total) by mouth daily. 90 tablet 0   cyanocobalamin  (VITAMIN B12) 1000 MCG tablet Take 1,000 mcg by mouth daily.     Evolocumab  (REPATHA  SURECLICK) 140 MG/ML SOAJ Inject 140 mg into the skin every 14 (fourteen) days. 6 mL 1   glipiZIDE  (GLUCOTROL  XL) 2.5 MG 24 hr tablet TAKE 1 TABLET BY MOUTH EVERY DAY WITH BREAKFAST 90 tablet 1   hydrochlorothiazide  (HYDRODIURIL ) 25 MG tablet Take 0.5 tablets (12.5 mg total) by mouth daily. 45 tablet 3   metFORMIN  (GLUCOPHAGE ) 1000 MG tablet Take 1 tablet (1,000 mg total) by mouth 2 (two) times daily with a meal. 180 tablet 3   nitroGLYCERIN  (NITROSTAT ) 0.4 MG SL tablet Place 1 tablet (0.4 mg total) under the tongue every 5 (five) minutes as needed for chest pain (CP or SOB). 25 tablet 3   sildenafil  (VIAGRA ) 100 MG tablet TAKE 1/2 - 1 TABLET BY MOUTH DAILY AS NEEDED FOR ERECTILE DYSFUNCTION 5 tablet 5   tirzepatide  (MOUNJARO ) 12.5 MG/0.5ML Pen Inject 12.5 mg into the skin once a week. 2 mL 3   valsartan  (DIOVAN ) 80 MG tablet Take 1 tablet (80 mg total) by mouth daily. 90 tablet 3   No current facility-administered medications on file prior to visit.

## 2024-10-16 NOTE — Patient Instructions (Addendum)
 It is dangerous to take celebrex  (nsaid) with a blood thinner   Stop the celebrex    Take tylenol  instead for pain as needed  Let us  know how that goes   Follow up with Dr Mardee - tell him you cannot take the celebrex  any more     I will send pepcid  (famotitine) to take 20 mg twice daily for heartburn  If symptoms are not much better in the next 1-2 weeks let us  know     Stick to a diabetic diet the best you can  Take care of yourself    Finish your aspirin  as planned Continue the plavix    Keep working your hand!      If you have any new stroke symptoms-call 911    I will work on referral to neurology   Please let us  know if you don't hear in 1-2 weeks to set that up the [employer] message or call or letter)   I will reach out to [place] about the savings card for medication   Follow up here in 3 months

## 2024-10-17 ENCOUNTER — Other Ambulatory Visit: Payer: Self-pay | Admitting: Family Medicine

## 2024-10-18 ENCOUNTER — Ambulatory Visit: Payer: Self-pay | Admitting: Family Medicine

## 2024-10-18 NOTE — Assessment & Plan Note (Signed)
 Re check today Last plt 93  On asa and plavix  for cva Changing over to plavix  alone

## 2024-10-18 NOTE — Assessment & Plan Note (Addendum)
 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

## 2024-10-18 NOTE — Assessment & Plan Note (Signed)
 Disc goals for lipids and reasons to control them Rev last labs with pt Rev low sat fat diet in detail Recent CVA/likely small vessel    Taking repatha   Under cardiology care

## 2024-10-18 NOTE — Assessment & Plan Note (Signed)
 From steatosis - re check today  On GLP - working on weight loss

## 2024-10-18 NOTE — Assessment & Plan Note (Signed)
 Worsened lately  On glp-1   Prescription pepcid  20 mg bid  Update if not improved with this  Encouraged to avoid triggers

## 2024-10-18 NOTE — Assessment & Plan Note (Signed)
 Ongoing  Has seen Dr Mardee in past  Instructed to stop celebrex  due to new plavix   (bleeding risk)  Pt states he cannot tolerate being off of it Voltaren gel may help  Tylenol  -pending liver tests Suspect injection may help-encouraged follow up with ortho

## 2024-10-18 NOTE — Assessment & Plan Note (Addendum)
 Lab today   Per liver bx  Your liver biopsy is consistent with fatty liver.  The Brunt classification is used to grade the severity of fatty liver & fibrosis. Your Brunt classification as necroinflammatory grade 2 of 3 and fibrosis stage 2-3 of 4.

## 2024-10-18 NOTE — Assessment & Plan Note (Signed)
 Discussed how this problem influences overall health and the risks it imposes  Reviewed plan for weight loss with lower calorie diet (via better food choices (lower glycemic and portion control) along with exercise building up to or more than 30 minutes 5 days per week including some aerobic activity and strength training    Continues mounjaro  12.5 mg weekly

## 2024-10-18 NOTE — Assessment & Plan Note (Addendum)
 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

## 2024-10-18 NOTE — Assessment & Plan Note (Addendum)
 Lab Results  Component Value Date   HGBA1C 7.0 (H) 10/08/2024   HGBA1C 6.1 (A) 04/10/2024   HGBA1C 9.1 (H) 10/23/2023   Metformin  1000 mg bid Glipizide  xl 2.5 mg daily-hold for low glucose Mounjaro  12.5 mg weekly (holding at this dose due to side effects of dizziness)   Has been off track with diet-plans to be better now

## 2024-10-18 NOTE — Assessment & Plan Note (Signed)
 With recent small vessel CVA bp in fair control at this time  BP Readings from Last 1 Encounters:  10/16/24 124/62   No changes needed Most recent labs reviewed  Disc lifstyle change with low sodium diet and exercise   Continues Amlodipine  5 mg daily Hydrochlorothiazide  25 mg daily  Valsartan  80 mg daily

## 2024-10-19 ENCOUNTER — Other Ambulatory Visit: Payer: Self-pay | Admitting: Family Medicine

## 2024-10-19 MED ORDER — TRAMADOL HCL 50 MG PO TABS: 50.0000 mg | ORAL_TABLET | Freq: Three times a day (TID) | ORAL | 0 refills | Status: AC | PRN

## 2024-10-19 NOTE — Telephone Encounter (Unsigned)
 Copied from CRM [id number]. Topic: Clinical - Medication Refill >> Oct 19, 2024 12:12 PM Frederich PARAS wrote: Medication: glipizide  2.5 mg, hydrochlorothiazide  (HYDRODIURIL ) 25 MG tablet, valsartan  (DIOVAN ) 80 MG tablet, amLODipine  (NORVASC ) 5 MG tablet  Has the patient contacted their pharmacy? Yes, pharmacy says the pcpc did not contact them back   This is the patient's preferred pharmacy:  [facility] [id number] [place] - [address] [place] [address] Phone: [phone] Fax: [phone]    Is this the correct pharmacy for this prescription? Yes If no, delete pharmacy and type the correct one.   Has the prescription been filled recently? Yes  Is the patient out of the medication? No, in 2 days he will  Has the patient been seen for an appointment in the last year OR does the patient have an upcoming appointment? Yes  Can we respond through MyChart? Yes  Agent: Please be advised that Rx refills may take up to 3 business days. We ask that you follow-up with your pharmacy.

## 2024-10-21 ENCOUNTER — Encounter: Payer: Self-pay | Admitting: Pharmacist

## 2024-10-21 NOTE — Progress Notes (Unsigned)
 Chart Review Reason: Drug information Question - Medication Assistance  Summary: Patient asks about speaking with pharmacist at recent PCP OV related to medication assistance.   In January, I assisted patient with obtaining a commercial savings card to lower the cost of his GLP1. Currently commercial insured. Should be eligible for a new card at the start of the year. Current card valid through 11/04/2024.   Considerations: Reminder set for first week of January - new card can be obtained online via [employer]  website. Will assist patient with this and provide to his pharmacy.   Discussed the above with patient via phone on 12/18. He verbalizes understanding.   Manuelita FABIENE Kobs, PharmD Clinical Pharmacist [facility] [phone]

## 2024-10-31 ENCOUNTER — Other Ambulatory Visit (HOSPITAL_COMMUNITY): Payer: Self-pay

## 2024-11-20 ENCOUNTER — Other Ambulatory Visit

## 2024-11-24 ENCOUNTER — Telehealth: Payer: Self-pay | Admitting: Neurology

## 2024-11-24 NOTE — Telephone Encounter (Signed)
 LVM and sent Mychart message informing pt reschedule is needed for 2/16 MD out

## 2024-11-25 ENCOUNTER — Emergency Department

## 2024-11-25 ENCOUNTER — Emergency Department: Admission: EM | Admit: 2024-11-25 | Discharge: 2024-11-25 | Disposition: A | Discharge status: Home / Self Care

## 2024-11-25 ENCOUNTER — Other Ambulatory Visit: Payer: Self-pay

## 2024-11-25 DIAGNOSIS — S0990XA Unspecified injury of head, initial encounter: Secondary | ICD-10-CM | POA: Insufficient documentation

## 2024-11-25 DIAGNOSIS — I10 Essential (primary) hypertension: Secondary | ICD-10-CM | POA: Insufficient documentation

## 2024-11-25 DIAGNOSIS — E119 Type 2 diabetes mellitus without complications: Secondary | ICD-10-CM | POA: Diagnosis not present

## 2024-11-25 DIAGNOSIS — S20312A Abrasion of left front wall of thorax, initial encounter: Secondary | ICD-10-CM | POA: Diagnosis not present

## 2024-11-25 DIAGNOSIS — Y92410 Unspecified street and highway as the place of occurrence of the external cause: Secondary | ICD-10-CM | POA: Diagnosis not present

## 2024-11-25 DIAGNOSIS — S3992XA Unspecified injury of lower back, initial encounter: Secondary | ICD-10-CM | POA: Diagnosis present

## 2024-11-25 DIAGNOSIS — S39012A Strain of muscle, fascia and tendon of lower back, initial encounter: Secondary | ICD-10-CM | POA: Diagnosis not present

## 2024-11-25 MED ORDER — METHOCARBAMOL 500 MG PO TABS
500.0000 mg | ORAL_TABLET | Freq: Once | ORAL | Status: AC
  Administered 2024-11-25: 500 mg via ORAL
  Filled 2024-11-25: qty 1

## 2024-11-25 MED ORDER — OXYCODONE-ACETAMINOPHEN 5-325 MG PO TABS
1.0000 | ORAL_TABLET | Freq: Once | ORAL | Status: AC
  Administered 2024-11-25: 1 via ORAL
  Filled 2024-11-25: qty 1

## 2024-11-25 MED ORDER — METHOCARBAMOL 500 MG PO TABS: 500.0000 mg | ORAL_TABLET | Freq: Two times a day (BID) | ORAL | 0 refills | Status: AC | PRN
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
## 2024-11-27 ENCOUNTER — Telehealth: Payer: Self-pay

## 2024-11-27 ENCOUNTER — Telehealth: Payer: Self-pay | Admitting: *Deleted

## 2024-11-27 NOTE — Telephone Encounter (Signed)
 I'm starting this letter and the letter head is indicating [facility] instead of our letter head? How do I change that? Thanks

## 2024-11-27 NOTE — Telephone Encounter (Signed)
 Called patient have scheduled for next Wednesday. Would like work note put in my chart. Advised if any questions we will reach out.
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
## 2024-11-27 NOTE — Telephone Encounter (Signed)
 Left VM requesting pt to call the office back   **e2c2: if pt calls back please schedule an appt next week and advise him of PCP's comments**

## 2024-11-27 NOTE — Telephone Encounter (Signed)
 Please get an appointment on the books-perhaps a week Let me know  Then I will work on the note (send back to me)  If any new/severe symptoms of course go to the ER

## 2024-11-27 NOTE — Telephone Encounter (Signed)
 Sent PCP a new phone note so she could place letter on mychart

## 2024-11-27 NOTE — Telephone Encounter (Signed)
 See prev note please place work note in [employer]

## 2024-12-02 ENCOUNTER — Ambulatory Visit: Admitting: Family Medicine

## 2024-12-02 ENCOUNTER — Encounter: Payer: Self-pay | Admitting: Pharmacist

## 2024-12-02 ENCOUNTER — Encounter: Payer: Self-pay | Admitting: Family Medicine

## 2024-12-02 DIAGNOSIS — M545 Low back pain, unspecified: Secondary | ICD-10-CM

## 2024-12-02 DIAGNOSIS — S20212S Contusion of left front wall of thorax, sequela: Secondary | ICD-10-CM

## 2024-12-02 DIAGNOSIS — S20219A Contusion of unspecified front wall of thorax, initial encounter: Secondary | ICD-10-CM | POA: Insufficient documentation

## 2024-12-02 NOTE — Progress Notes (Signed)
 Chart Review Reason: Drug information Question - [employer]  cost  Summary: Previously assisted patient with [employer]  savings card to lower his cost at the pharmacy to ~$50.   Savings card typically expire 11/04/2025.  Patient enrolled in new savings card for 2026. Provided him with a printed copy, attached below for future reference as needed.
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
## 2024-12-02 NOTE — Patient Instructions (Addendum)
 I will work on a physical therapy referral to help you get better   I put the referral in for physical therapy  Please let us  know if you don't hear in 1-2 weeks to set that up (mychart message or call or letter)      Take care of yourself  Heat is helpful for sore muscles - 10 minutes at a time   Keep moving/slowly (take breaks from sitting)

## 2024-12-02 NOTE — Assessment & Plan Note (Deleted)
 [name]
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
## 2024-12-14 ENCOUNTER — Ambulatory Visit: Admitting: Emergency Medicine

## 2024-12-21 ENCOUNTER — Inpatient Hospital Stay: Admitting: Neurology

## 2025-01-06 ENCOUNTER — Inpatient Hospital Stay: Admitting: Neurology
